# Patient Record
Sex: Female | Born: 1970 | Race: Black or African American | Hispanic: No | Marital: Single | Smoking: Never smoker
Health system: Southern US, Community
[De-identification: ages and names within clinical notes are randomized; demographics above are authoritative.]

## PROBLEM LIST (undated history)

## (undated) DIAGNOSIS — Z5189 Encounter for other specified aftercare: Secondary | ICD-10-CM

## (undated) DIAGNOSIS — D649 Anemia, unspecified: Secondary | ICD-10-CM

## (undated) DIAGNOSIS — N920 Excessive and frequent menstruation with regular cycle: Secondary | ICD-10-CM

## (undated) DIAGNOSIS — M069 Rheumatoid arthritis, unspecified: Secondary | ICD-10-CM

## (undated) DIAGNOSIS — E669 Obesity, unspecified: Secondary | ICD-10-CM

## (undated) DIAGNOSIS — K209 Esophagitis, unspecified without bleeding: Secondary | ICD-10-CM

## (undated) DIAGNOSIS — I2510 Atherosclerotic heart disease of native coronary artery without angina pectoris: Secondary | ICD-10-CM

## (undated) DIAGNOSIS — I10 Essential (primary) hypertension: Secondary | ICD-10-CM

## (undated) DIAGNOSIS — I214 Non-ST elevation (NSTEMI) myocardial infarction: Secondary | ICD-10-CM

## (undated) DIAGNOSIS — Z9289 Personal history of other medical treatment: Secondary | ICD-10-CM

## (undated) HISTORY — DX: Esophagitis, unspecified without bleeding: K20.90

## (undated) HISTORY — DX: Personal history of other medical treatment: Z92.89

## (undated) HISTORY — DX: Obesity, unspecified: E66.9

## (undated) HISTORY — PX: DILATION AND CURETTAGE OF UTERUS: SHX78

## (undated) HISTORY — DX: Encounter for other specified aftercare: Z51.89

## (undated) HISTORY — DX: Esophagitis, unspecified: K20.9

## (undated) HISTORY — DX: Excessive and frequent menstruation with regular cycle: N92.0

## (undated) HISTORY — DX: Essential (primary) hypertension: I10

## (undated) HISTORY — PX: BREAST BIOPSY: SHX20

## (undated) HISTORY — PX: OTHER SURGICAL HISTORY: SHX169

---

## 2011-04-23 ENCOUNTER — Encounter: Payer: Self-pay | Admitting: *Deleted

## 2011-04-23 ENCOUNTER — Emergency Department (HOSPITAL_COMMUNITY)
Admission: EM | Admit: 2011-04-23 | Discharge: 2011-04-23 | Disposition: A | Discharge status: Home / Self Care | Payer: Medicaid Other | Attending: Emergency Medicine | Admitting: Emergency Medicine

## 2011-04-23 DIAGNOSIS — L0231 Cutaneous abscess of buttock: Secondary | ICD-10-CM | POA: Insufficient documentation

## 2011-04-23 DIAGNOSIS — L03317 Cellulitis of buttock: Secondary | ICD-10-CM | POA: Insufficient documentation

## 2011-04-23 MED ORDER — HYDROCODONE-ACETAMINOPHEN 5-325 MG PO TABS
1.0000 | ORAL_TABLET | Freq: Four times a day (QID) | ORAL | Status: AC | PRN
Start: 2011-04-23 — End: 2011-05-03

## 2011-04-23 MED ORDER — LIDOCAINE-EPINEPHRINE 2 %-1:100000 IJ SOLN
20.0000 mL | Freq: Once | INTRAMUSCULAR | Status: AC
  Administered 2011-04-23: 20 mL
  Filled 2011-04-23: qty 1

## 2011-04-23 MED ORDER — HYDROCODONE-ACETAMINOPHEN 5-325 MG PO TABS
1.0000 | ORAL_TABLET | Freq: Once | ORAL | Status: AC
  Administered 2011-04-23: 1 via ORAL
  Filled 2011-04-23: qty 1

## 2011-04-23 MED ORDER — DOXYCYCLINE HYCLATE 100 MG PO CAPS: 100.0000 mg | ORAL_CAPSULE | Freq: Two times a day (BID) | ORAL | Status: DC

## 2011-04-23 NOTE — ED Provider Notes (Signed)
Medical screening examination/treatment/procedure(s) were performed by non-physician practitioner and as supervising physician I was immediately available for consultation/collaboration.  Chalsey Leeth K Queena Monrreal-Rasch, MD 04/23/11 2245 

## 2011-04-23 NOTE — ED Provider Notes (Signed)
History     CSN: [id number] Arrival date & time: 04/23/2011  5:48 PM   First MD Initiated Contact with Patient 04/23/11 1955     HPI Patient reports an abscess developing on the left buttock. States she's had a similar abscess 6 years ago in same area. Points to abscess located lateral to the anus. Denies any drainage. Denies fever.  Patient is a 40 y.o. female presenting with abscess. The history is provided by the patient.  Abscess  This is a new problem. The current episode started less than one week ago. The problem has been gradually worsening. The abscess is present on the left buttock. The problem is severe. The abscess is characterized by painfulness and swelling. Pertinent negatives include no anorexia, no fever, no vomiting, no rhinorrhea and no cough.    History reviewed. No pertinent past medical history.  Past Surgical History  Procedure Date  . Cesarean section     No family history on file.  History  Substance Use Topics  . Smoking status: Never Smoker   . Smokeless tobacco: Not on file  . Alcohol Use: No    OB History    Grav Para Term Preterm Abortions TAB SAB Ect Mult Living                  Review of Systems  Constitutional: Negative for fever and chills.  HENT: Negative for rhinorrhea.   Eyes: Negative for redness.  Respiratory: Negative for cough, shortness of breath and wheezing.   Cardiovascular: Negative for chest pain and palpitations.  Gastrointestinal: Negative for nausea, vomiting and anorexia.  Skin:       Abscess    Allergies  Review of patient's allergies indicates no known allergies.  Home Medications   Current Outpatient Rx  Name Route Sig Dispense Refill  . IBUPROFEN 100 MG PO TABS Oral Take 200 mg by mouth every 6 (six) hours as needed. Pain.        BP 118/83  Pulse 79  Temp(Src) 98.8 F (37.1 C) (Oral)  Resp 18  SpO2 98%  LMP 04/23/2011  Physical Exam  Vitals reviewed. Constitutional: She is oriented to person,  place, and time. Vital signs are normal. She appears well-developed and well-nourished. No distress.  HENT:  Head: Normocephalic and atraumatic.  Eyes: Pupils are equal, round, and reactive to light.  Neck: Neck supple.  Pulmonary/Chest: Effort normal.  Neurological: She is alert and oriented to person, place, and time.  Skin: Skin is warm and dry. No rash noted. No erythema. No pallor.       Large 3 cm fluctuant not erythematous abscess on left buttock lateral to the pain is at approximately the 7:00 position. Abscesses not extend rectally after completing rectal exam. Not erythematous. Not draining.  Psychiatric: She has a normal mood and affect. Her behavior is normal.    ED Course  INCISION AND DRAINAGE Performed by: Thomasene Lot Authorized by: Thomasene Lot Consent: Verbal consent obtained. Risks and benefits: risks, benefits and alternatives were discussed Consent given by: patient Patient understanding: patient states understanding of the procedure being performed Patient identity confirmed: verbally with patient Time out: Immediately prior to procedure a "time out" was called to verify the correct patient, procedure, equipment, support staff and site/side marked as required. Type: abscess Location: Left buttock. Local anesthetic: lidocaine 2% with epinephrine Anesthetic total: 7 ml Patient sedated: no Scalpel size: 11 Incision type: single straight Complexity: simple Drainage: purulent Drainage amount: copious Wound treatment: wound left  open Packing material: 1/4 in iodoform gauze Comments: Patient unable to tolerate complete incision of abscess do to an adequate anesthesia with local. Have packed wound is advised patient to be sure to take complete course of antibiotics and tube were water soaks. Advised return for reassessment in 2-3 days.    MDM         Thomasene Lot, PA 04/23/11 2222

## 2011-04-23 NOTE — ED Notes (Signed)
Pt noticed pain in L buttocks last Sunday, more severe pain beginning Friday. Hx of multiple boils. Denies drainage.

## 2011-04-27 ENCOUNTER — Emergency Department (HOSPITAL_COMMUNITY)
Admission: EM | Admit: 2011-04-27 | Discharge: 2011-04-27 | Disposition: A | Discharge status: Home / Self Care | Payer: Medicaid Other | Attending: Emergency Medicine | Admitting: Emergency Medicine

## 2011-04-27 ENCOUNTER — Encounter (HOSPITAL_COMMUNITY): Payer: Self-pay | Admitting: Adult Health

## 2011-04-27 DIAGNOSIS — L0291 Cutaneous abscess, unspecified: Secondary | ICD-10-CM

## 2011-04-27 DIAGNOSIS — L0231 Cutaneous abscess of buttock: Secondary | ICD-10-CM | POA: Insufficient documentation

## 2011-04-27 NOTE — ED Provider Notes (Signed)
History     CSN: [id number] Arrival date & time: 04/27/2011  6:36 PM   First MD Initiated Contact with Patient 04/27/11 1854      Chief Complaint  Patient presents with  . Wound Check    (Consider location/radiation/quality/duration/timing/severity/associated sxs/prior treatment) Patient is a 40 y.o. female presenting with wound check. The history is provided by the patient.  Wound Check  She was treated in the ED 10 to 14 days ago. Previous treatment in the ED includes oral antibiotics and I&D of abscess. Treatments since wound repair include antibiotic ointment use and a wound recheck. There has been no drainage from the wound. The redness has improved. The swelling has improved. The pain has improved.   Pt still has 7 days of antibiotics left. Wound I and D last Wednesday, a lot of improvement. Right inner buttock.  History reviewed. No pertinent past medical history.  Past Surgical History  Procedure Date  . Cesarean section     History reviewed. No pertinent family history.  History  Substance Use Topics  . Smoking status: Never Smoker   . Smokeless tobacco: Not on file  . Alcohol Use: No    OB History    Grav Para Term Preterm Abortions TAB SAB Ect Mult Living                  Review of Systems  All other systems reviewed and are negative.    Allergies  Review of patient's allergies indicates no known allergies.  Home Medications   Current Outpatient Rx  Name Route Sig Dispense Refill  . DOXYCYCLINE HYCLATE 100 MG PO TABS Oral Take 100 mg by mouth 2 (two) times daily. 10 day course of therapy; not completed.     Marland Kitchen HYDROCODONE-ACETAMINOPHEN 5-325 MG PO TABS Oral Take 1-2 tablets by mouth every 6 (six) hours as needed for pain. 15 tablet 0  . IBUPROFEN 100 MG PO TABS Oral Take 400 mg by mouth every 6 (six) hours as needed. For pain.      BP 125/106  Pulse 96  Temp(Src) 98.5 F (36.9 C) (Oral)  Resp 20  SpO2 100%  LMP 04/23/2011  Physical Exam    Nursing note and vitals reviewed. Constitutional: She appears well-developed and well-nourished.  HENT:  Head: Normocephalic and atraumatic.  Eyes: Conjunctivae are normal. Pupils are equal, round, and reactive to light.  Neck: Trachea normal, normal range of motion and full passive range of motion without pain. Neck supple.  Cardiovascular: Normal rate, regular rhythm and normal pulses.   Pulmonary/Chest: Effort normal and breath sounds normal. Chest wall is not dull to percussion. She exhibits no tenderness, no crepitus, no edema, no deformity and no retraction.  Abdominal: Soft. Normal appearance and bowel sounds are normal.  Musculoskeletal: Normal range of motion.  Lymphadenopathy:       Head (right side): No submental, no submandibular, no tonsillar, no preauricular, no posterior auricular and no occipital adenopathy present.       Head (left side): No submental, no submandibular, no tonsillar, no preauricular, no posterior auricular and no occipital adenopathy present.    She has no cervical adenopathy.    She has no axillary adenopathy.  Neurological: She is alert. She has normal strength.  Skin: Skin is warm, dry and intact.     Psychiatric: She has a normal mood and affect. Her speech is normal and behavior is normal. Judgment and thought content normal. Cognition and memory are normal.    ED  Course  Procedures (including critical care time)  Labs Reviewed - No data to display No results found.   No diagnosis found.    MDM          Dorthula Matas, PA 04/27/11 1920

## 2011-04-27 NOTE — ED Notes (Signed)
Here for wound check

## 2011-04-27 NOTE — ED Provider Notes (Signed)
Medical screening examination/treatment/procedure(s) were performed by non-physician practitioner and as supervising physician I was immediately available for consultation/collaboration.   Joya Gaskins, MD 04/27/11 [phone]

## 2012-06-03 HISTORY — PX: CARDIAC CATHETERIZATION: SHX172

## 2012-11-14 ENCOUNTER — Encounter (HOSPITAL_COMMUNITY): Payer: Self-pay | Admitting: Emergency Medicine

## 2012-11-14 ENCOUNTER — Inpatient Hospital Stay (HOSPITAL_COMMUNITY)
Admission: EM | Admit: 2012-11-14 | Discharge: 2012-11-15 | DRG: 281 | Discharge status: Against Medical Advice | Payer: Medicaid Other | Attending: Internal Medicine | Admitting: Internal Medicine

## 2012-11-14 ENCOUNTER — Emergency Department (HOSPITAL_COMMUNITY): Payer: Medicaid Other

## 2012-11-14 DIAGNOSIS — I519 Heart disease, unspecified: Secondary | ICD-10-CM

## 2012-11-14 DIAGNOSIS — D473 Essential (hemorrhagic) thrombocythemia: Secondary | ICD-10-CM

## 2012-11-14 DIAGNOSIS — D509 Iron deficiency anemia, unspecified: Secondary | ICD-10-CM | POA: Diagnosis present

## 2012-11-14 DIAGNOSIS — I214 Non-ST elevation (NSTEMI) myocardial infarction: Principal | ICD-10-CM | POA: Diagnosis present

## 2012-11-14 DIAGNOSIS — M0579 Rheumatoid arthritis with rheumatoid factor of multiple sites without organ or systems involvement: Secondary | ICD-10-CM | POA: Diagnosis present

## 2012-11-14 DIAGNOSIS — M069 Rheumatoid arthritis, unspecified: Secondary | ICD-10-CM

## 2012-11-14 DIAGNOSIS — Z6841 Body Mass Index (BMI) 40.0 and over, adult: Secondary | ICD-10-CM

## 2012-11-14 DIAGNOSIS — R7989 Other specified abnormal findings of blood chemistry: Secondary | ICD-10-CM

## 2012-11-14 DIAGNOSIS — D649 Anemia, unspecified: Secondary | ICD-10-CM

## 2012-11-14 DIAGNOSIS — E669 Obesity, unspecified: Secondary | ICD-10-CM | POA: Diagnosis present

## 2012-11-14 HISTORY — DX: Rheumatoid arthritis, unspecified: M06.9

## 2012-11-14 LAB — CBC WITH DIFFERENTIAL/PLATELET
Basophils Absolute: 0.1 10*3/uL (ref 0.0–0.1)
Basophils Relative: 1 % (ref 0–1)
Eosinophils Absolute: 0.2 10*3/uL (ref 0.0–0.7)
Eosinophils Relative: 2 % (ref 0–5)
HCT: 27.3 % — ABNORMAL LOW (ref 36.0–46.0)
Hemoglobin: 7.6 g/dL — ABNORMAL LOW (ref 12.0–15.0)
Lymphocytes Relative: 29 % (ref 12–46)
Lymphs Abs: 2.9 10*3/uL (ref 0.7–4.0)
MCH: 16.6 pg — ABNORMAL LOW (ref 26.0–34.0)
MCHC: 27.8 g/dL — ABNORMAL LOW (ref 30.0–36.0)
MCV: 59.5 fL — ABNORMAL LOW (ref 78.0–100.0)
Monocytes Absolute: 1.1 10*3/uL — ABNORMAL HIGH (ref 0.1–1.0)
Monocytes Relative: 11 % (ref 3–12)
Neutro Abs: 5.6 10*3/uL (ref 1.7–7.7)
Neutrophils Relative %: 57 % (ref 43–77)
Platelets: 689 10*3/uL — ABNORMAL HIGH (ref 150–400)
RBC: 4.59 MIL/uL (ref 3.87–5.11)
RDW: 21 % — ABNORMAL HIGH (ref 11.5–15.5)
WBC: 9.9 10*3/uL (ref 4.0–10.5)

## 2012-11-14 LAB — COMPREHENSIVE METABOLIC PANEL
BUN: 7 mg/dL (ref 6–23)
CO2: 25 mEq/L (ref 19–32)
Calcium: 9.2 mg/dL (ref 8.4–10.5)
Creatinine, Ser: 0.66 mg/dL (ref 0.50–1.10)
GFR calc Af Amer: >90 mL/min (ref >90)
GFR calc non Af Amer: >90 mL/min (ref >90)
Glucose, Bld: 95 mg/dL (ref 70–99)
Total Bilirubin: 0.2 mg/dL — ABNORMAL LOW (ref 0.3–1.2)

## 2012-11-14 LAB — MRSA PCR SCREENING: MRSA by PCR: NEGATIVE

## 2012-11-14 LAB — RAPID URINE DRUG SCREEN, HOSP PERFORMED
Amphetamines: NOT DETECTED
Barbiturates: NOT DETECTED
Benzodiazepines: NOT DETECTED
Cocaine: NOT DETECTED
Opiates: NOT DETECTED
Tetrahydrocannabinol: NOT DETECTED

## 2012-11-14 LAB — CBC
HCT: 32.7 % — ABNORMAL LOW (ref 36.0–46.0)
Hemoglobin: 9.2 g/dL — ABNORMAL LOW (ref 12.0–15.0)
MCHC: 28.1 g/dL — ABNORMAL LOW (ref 30.0–36.0)
MCV: 61 fL — ABNORMAL LOW (ref 78.0–100.0)
RBC: 5.36 MIL/uL — ABNORMAL HIGH (ref 3.87–5.11)
RDW: 22.8 % — ABNORMAL HIGH (ref 11.5–15.5)
WBC: 12.2 10*3/uL — ABNORMAL HIGH (ref 4.0–10.5)

## 2012-11-14 LAB — TROPONIN I
Troponin I: 1.65 ng/mL (ref <0.30)
Troponin I: 2.33 ng/mL (ref <0.30)

## 2012-11-14 LAB — FERRITIN: Ferritin: 1 ng/mL — ABNORMAL LOW (ref 10–291)

## 2012-11-14 LAB — PROTIME-INR
INR: 1.14 (ref 0.00–1.49)
Prothrombin Time: 14.4 seconds (ref 11.6–15.2)

## 2012-11-14 LAB — RETICULOCYTES
RBC.: 4.87 MIL/uL (ref 3.87–5.11)
Retic Count, Absolute: 92.5 10*3/uL (ref 19.0–186.0)
Retic Ct Pct: 1.9 % (ref 0.4–3.1)

## 2012-11-14 LAB — VITAMIN B12: Vitamin B-12: 599 pg/mL (ref 211–911)

## 2012-11-14 LAB — FOLATE: Folate: 19 ng/mL

## 2012-11-14 MED ORDER — SODIUM CHLORIDE 0.9 % IV SOLN: 250.0000 mL | INTRAVENOUS | Status: DC | PRN

## 2012-11-14 MED ORDER — ONDANSETRON HCL 4 MG/2ML IJ SOLN: 4.0000 mg | Freq: Four times a day (QID) | INTRAMUSCULAR | Status: DC | PRN

## 2012-11-14 MED ORDER — ASPIRIN EC 81 MG PO TBEC
81.0000 mg | DELAYED_RELEASE_TABLET | Freq: Every day | ORAL | Status: DC
  Administered 2012-11-15: 81 mg via ORAL
  Filled 2012-11-14: qty 1

## 2012-11-14 MED ORDER — ACETAMINOPHEN 325 MG PO TABS: 650.0000 mg | ORAL_TABLET | ORAL | Status: DC | PRN

## 2012-11-14 MED ORDER — ASPIRIN 81 MG PO CHEW
324.0000 mg | CHEWABLE_TABLET | Freq: Once | ORAL | Status: AC
  Administered 2012-11-14: 324 mg via ORAL
  Filled 2012-11-14: qty 4

## 2012-11-14 MED ORDER — NITROGLYCERIN 0.4 MG SL SUBL
0.4000 mg | SUBLINGUAL_TABLET | SUBLINGUAL | Status: DC | PRN
  Administered 2012-11-14: 0.4 mg via SUBLINGUAL
  Filled 2012-11-14: qty 25

## 2012-11-14 MED ORDER — HEPARIN (PORCINE) IN NACL 100-0.45 UNIT/ML-% IJ SOLN
1500.0000 [IU]/h | INTRAMUSCULAR | Status: DC
  Administered 2012-11-14: 1250 [IU]/h via INTRAVENOUS
  Administered 2012-11-15: 1500 [IU]/h via INTRAVENOUS
  Filled 2012-11-14 (×4): qty 250

## 2012-11-14 MED ORDER — MORPHINE SULFATE 2 MG/ML IJ SOLN: 2.0000 mg | INTRAMUSCULAR | Status: DC | PRN

## 2012-11-14 MED ORDER — GI COCKTAIL ~~LOC~~
30.0000 mL | Freq: Once | ORAL | Status: AC
  Administered 2012-11-14: 30 mL via ORAL
  Filled 2012-11-14: qty 30

## 2012-11-14 MED ORDER — SODIUM CHLORIDE 0.9 % IJ SOLN: 3.0000 mL | INTRAMUSCULAR | Status: DC | PRN

## 2012-11-14 MED ORDER — SIMVASTATIN 20 MG PO TABS
20.0000 mg | ORAL_TABLET | Freq: Every day | ORAL | Status: DC
  Filled 2012-11-14: qty 1

## 2012-11-14 MED ORDER — ATORVASTATIN CALCIUM 80 MG PO TABS
80.0000 mg | ORAL_TABLET | Freq: Every day | ORAL | Status: DC
  Administered 2012-11-14: 80 mg via ORAL
  Filled 2012-11-14 (×2): qty 1

## 2012-11-14 MED ORDER — SODIUM CHLORIDE 0.9 % IJ SOLN: 3.0000 mL | Freq: Two times a day (BID) | INTRAMUSCULAR | Status: DC

## 2012-11-14 MED ORDER — HEPARIN BOLUS VIA INFUSION
4000.0000 [IU] | Freq: Once | INTRAVENOUS | Status: AC
  Administered 2012-11-14: 4000 [IU] via INTRAVENOUS
  Filled 2012-11-14: qty 4000

## 2012-11-14 MED ORDER — HEPARIN BOLUS VIA INFUSION
2000.0000 [IU] | Freq: Once | INTRAVENOUS | Status: AC
  Administered 2012-11-14: 2000 [IU] via INTRAVENOUS
  Filled 2012-11-14: qty 2000

## 2012-11-14 MED ORDER — SODIUM CHLORIDE 0.9 % IV SOLN: INTRAVENOUS | Status: DC

## 2012-11-14 MED ORDER — HEPARIN (PORCINE) IN NACL 100-0.45 UNIT/ML-% IJ SOLN
1000.0000 [IU]/h | INTRAMUSCULAR | Status: DC
  Administered 2012-11-14: 1000 [IU]/h via INTRAVENOUS
  Filled 2012-11-14: qty 250

## 2012-11-14 NOTE — ED Notes (Signed)
Report called to tim.

## 2012-11-14 NOTE — ED Provider Notes (Signed)
  Medical screening examination/treatment/procedure(s) were performed by non-physician practitioner and as supervising physician I was immediately available for consultation/collaboration.  On my exam the patient was in no distress.  However, given her description of new chest pain, her elevated troponin, her history of rheumatologic disease, her ECG with low voltage, and her anemia, there is clearly significant processes at work. I performed a bedside ultrasound of her heart to evaluate for pericardial effusion. And this was negative. Given the patient's elevated troponin, her description of new CP, there is some suspicion of unstable angina, and with this concern, heparin was started after consult with cardiology.  With the remaining concern for pericarditis vs. Myocarditis, the patient was admitted and had expeditious echo ordered, as well as transfusion (concern for anemia in the setting of increased myocardial demand).  CRITICAL CARE Performed by: Gerhard Munch Total critical care time: 35 Critical care time was exclusive of separately billable procedures and treating other patients. Critical care was necessary to treat or prevent imminent or life-threatening deterioration. Critical care was time spent personally by me on the following activities: development of treatment plan with patient and/or surrogate as well as nursing, discussions with consultants, evaluation of patient's response to treatment, examination of patient, obtaining history from patient or surrogate, ordering and performing treatments and interventions, ordering and review of laboratory studies, ordering and review of radiographic studies, pulse oximetry and re-evaluation of patient's condition.  ECG most notable for low voltage, w SR 94 CXR essentially unremarkable.  Gerhard Munch, MD 11/14/12 [phone]

## 2012-11-14 NOTE — Progress Notes (Signed)
ANTICOAGULATION CONSULT NOTE - Initial Consult  Pharmacy Consult for heparin Indication: NSTEMI  No Known Allergies  Patient Measurements: Height: 5\' 3"  (160 cm) Weight: 250 lb 14.1 oz (113.8 kg) IBW/kg (Calculated) : 52.4 Heparin Dosing Weight: 79.5 kg  Vital Signs: Temp: 97.9 F (36.6 C) (06/14 1240) Temp src: Oral (06/14 1240) BP: 100/65 mmHg (06/14 1200) Pulse Rate: 82 (06/14 1240)  Labs:  Recent Labs  11/14/12 0806 11/14/12 0810  HGB 7.6*  --   HCT 27.3*  --   PLT 689*  --   CREATININE 0.66  --   TROPONINI  --  1.65*    Estimated Creatinine Clearance: 112.5 ml/min (by C-G formula based on Cr of 0.66).   Medical History: Past Medical History  Diagnosis Date  . RA (rheumatoid arthritis)     Medications:  No prescriptions prior to admission    Assessment: 75 y/oF admitted with chest pain and elevated troponins, starting heparin per pharmacy. On admit, hgb 7.6 and platelets 689 with planned transfusion PRBCs. Planning cath for Monday.   Goal of Therapy:  Heparin level 0.3-0.7 units/ml Monitor platelets by anticoagulation protocol: Yes   Plan:  Bolus heparin 4000 units Start heparin drip at 1000 units/hr 6 hr heparin level Daily heparin level, CBC   Doris Cheadle, PharmD Clinical Pharmacist Pager: [phone] Phone: [phone] 11/14/2012 12:56 PM

## 2012-11-14 NOTE — ED Notes (Addendum)
Pt c/o intermittent center cp that radiates to back and down left arm onset Thursday. Pt reports felt fine Friday and was awaken today with CP. Pt reports doing exercises on Wednesday

## 2012-11-14 NOTE — Progress Notes (Signed)
ANTICOAGULATION CONSULT NOTE - Follow Up Consult  Pharmacy Consult for heparin Indication: NSTEMI  No Known Allergies  Patient Measurements: Height: 5\' 3"  (160 cm) Weight: 250 lb 14.1 oz (113.8 kg) IBW/kg (Calculated) : 52.4 Heparin Dosing Weight: 79.5kg  Vital Signs: Temp: 98 F (36.7 C) (06/14 2000) Temp src: Oral (06/14 2000) BP: 153/94 mmHg (06/14 2000) Pulse Rate: 82 (06/14 1240)  Labs:  Recent Labs  11/14/12 0806  11/14/12 1301 11/14/12 1302 11/14/12 1430 11/14/12 2000 11/14/12 2017  HGB 7.6*  --   --   --   --  9.2*  --   HCT 27.3*  --   --   --   --  32.7*  --   PLT 689*  --   --   --   --  673*  --   APTT  --   --   --   --  84*  --   --   LABPROT  --   --   --   --  14.4  --   --   INR  --   --   --   --  1.14  --   --   HEPARINUNFRC  --   --   --   --   --   --  0.15*  CREATININE 0.66  --   --   --   --   --   --   TROPONINI  --   < > 2.34* 2.25*  --   --  2.33*  < > = values in this interval not displayed.  Estimated Creatinine Clearance: 112.5 ml/min (by C-G formula based on Cr of 0.66).   Medications:  Scheduled:  . [START ON 11/15/2012] aspirin EC  81 mg Oral Daily  . atorvastatin  80 mg Oral q1800  . heparin  2,000 Units Intravenous Once  . sodium chloride  3 mL Intravenous Q12H    Assessment: 42 yo female with NSTEMI on heparin. The initial heparin level is 0.15 and below goal.  Goal of Therapy:  Heparin level 0.3-0.7 units/ml Monitor platelets by anticoagulation protocol: Yes   Plan:  -heparin 2000 unit bolus then increase infusion to 1250 units/hr -Heparin level in 6hrs  Harland German, Pharm D 11/14/2012 9:04 PM

## 2012-11-14 NOTE — Consult Note (Signed)
CARDIOLOGY CONSULT NOTE  Patient ID: Abigail Garcia MRN: [id number] DOB/AGE: 42-May-1972 42 y.o.  Admit date: 11/14/2012  Reason for Consultation: Elevated troponin/NSTEMI  HPI:  42 yo with history of rheumatoid arthritis presented to the ER today for evaluation of chest pain.  Patient has no history of cardiac disease.  On Thursday, she noted soreness in her chest substernally.  There was not trigger.  She thought she had strained a muscle.  The pain waxed and waned Thursday and Friday, sometimes resolving completely.   Nothing made it better or worse.  Early this morning, the pain woke her from sleep.  This concerned her so she came to the ER.  She is now pain-free again.  The pain has not been pleuritic.  She never had it prior to Thursday.  No history of PE/DVT and no long car/plane rides, surgery, or periods of immobility.  CXR was unremarkable.  ECG was nonspecific.  Troponin was elevated at 1.65.   At baseline, she denies exertional dyspnea or chest pain.  No episodes of palpitations or syncope.  Her RA has been quiescent for the last year and she is not on medications at this time.  No significant family history of cardiac disease.  She is anemic with no overt bleeding (no BRBPR or melena).   Review of systems complete and found to be negative unless listed above in HPI  Past Medical History: 1. Rheumatoid arthritis: No meds currently.  Has taken prednisone in the past.  2. Anemia  No outpatient meds  Family History: No cardiac disease that she can think of.  History   Social History  . Marital Status: Single    Spouse Name: N/A    Number of Children: 1 son  . Years of Education: N/A   Occupational History  . [employer] information systems officer [employer]   Social History Main Topics  . Smoking status: Never Smoker   . Smokeless tobacco: Not on file  . Alcohol Use: No  . Drug Use: No  . Sexually Active: Not on file   Other Topics Concern  . Not on file   Social History Narrative    . No narrative on file      (Not in a hospital admission)  Physical exam Blood pressure 100/65, pulse 71, temperature 98.6 F (37 C), temperature source Oral, resp. rate 14, height 5\' 3"  (1.6 m), weight 240 lb (108.863 kg), last menstrual period 11/03/2012, SpO2 100.00%. General: NAD Neck: No JVD, no thyromegaly or thyroid nodule.  Lungs: Clear to auscultation bilaterally with normal respiratory effort. CV: Nondisplaced PMI.  Heart regular S1/S2, no S3/S4, no murmur.  No peripheral edema.  No carotid bruit.  Normal pedal pulses.  Abdomen: Soft, nontender, no hepatosplenomegaly, no distention.  Skin: Intact without lesions or rashes.  Neurologic: Alert and oriented x 3.  Psych: Normal affect. Extremities: No clubbing or cyanosis.  HEENT: Normal.   Labs:   Lab Results  Component Value Date   WBC 9.9 11/14/2012   HGB 7.6* 11/14/2012   HCT 27.3* 11/14/2012   MCV 59.5* 11/14/2012   PLT 689* 11/14/2012    Recent Labs Lab 11/14/12 0806  NA 137  K 3.7  CL 103  CO2 25  BUN 7  CREATININE 0.66  CALCIUM 9.2  PROT 7.5  BILITOT 0.2*  ALKPHOS 66  ALT 9  AST 16  GLUCOSE 95   Lab Results  Component Value Date   TROPONINI 1.65* 11/14/2012     Radiology:  -  CXR: No acute findings  EKG:NSR, T wave flattening in III and AVF  ASSESSMENT AND PLAN:  42 yo with history of RA and anemia presented with substernal chest soreness and elevated troponin. 1. NSTEMI: Elevated TnI to 1.65.  She has had chest soreness on and off since Thursday.  Currently pain-free.  Pain is not pleuritic, no factors pointing towards PE.  She does have rheumatoid arthritis.  This increases her risk for CAD and also can be associated with myocarditis or pericarditis.  She does not have a family history of premature CAD or any risk factors for CAD other than RA.   - Echo today for evidence of myocarditis/pericarditis or evidence of regional wall motion abnormality suggesting CAD.  - Cycle cardiac enzymes to peak.   - Agree with ASA, heparin gtt, atorvastatin 80.  - I think that she will need LHC to rule out significant CAD.  Will arrange for Monday if she remains stable.  2. RA: No joint pains.  Would be unusual to develop myopericarditis in the absence of active joint symptoms.  Will check ESR. 3. Anemia: Agree with transfusion for hgb< 8 in setting of ACS.  Workup per primary team.   Signed: [name] 11/14/2012 12:48 PM

## 2012-11-14 NOTE — ED Notes (Signed)
MD aware of elevated troponin

## 2012-11-14 NOTE — Progress Notes (Signed)
  Echocardiogram 2D Echocardiogram has been performed.  Hansen Carino FRANCES 11/14/2012, 4:51 PM

## 2012-11-14 NOTE — H&P (Signed)
Date: 11/14/2012               Patient Name:  Abigail Garcia MRN: [id number]  DOB: 07-11-1970 Age / Sex: 42 y.o., female   PCP: No Pcp Per Patient         Medical Service: Internal Medicine Teaching Service         Attending Physician: Dr. Rogelia Boga    First Contact: Dr. Elenor Legato Pager: [phone]  Second Contact: Dr. Suszanne Conners Pager: [phone]       After Hours (After 5p/  First Contact Pager: [phone]  weekends / holidays): Second Contact Pager: [phone]   Chief Complaint: chest pain  History of Present Illness: patient is a 42 year old woman with PMH significant for rheumatoid arthritis not currently on any medications who presents to the ED with complaints of chest pain. The patient states that she had an episode of midsternal chest pain 2 days prior to admission which she describes as feeling "sore" that was moderate/severe on onset, however resolved slowly over 48 hours. She states she had a subsequent episode of similar chest pain on the morning of admission that was slightly more severe than her previous episode and did not resolve, which prompted her to call EMS. She states the chest pain radiated through her chest to her back and also down her left arm. She states the pain is somewhat improved after receiving nitroglycerin in the ED. She denies any shortness of breath, diaphoresis, nausea, or lightheadedness with either episode of chest pain. She denies leg swelling. She denies any recent change in her activity level or recent immobilization. Denies any recent travel.   Meds: Current Facility-Administered Medications  Medication Dose Route Frequency Provider Last Rate Last Dose  . nitroGLYCERIN (NITROSTAT) SL tablet 0.4 mg  0.4 mg Sublingual Q5 min PRN Trixie Dredge, PA-C   0.4 mg at 11/14/12 1035   No current outpatient prescriptions on file.    Allergies: Allergies as of 11/14/2012  . (No Known Allergies)   Past Medical History  Diagnosis Date  . RA (rheumatoid arthritis)     Past Surgical History  Procedure Laterality Date  . Cesarean section     No family history on file. History   Social History  . Marital Status: Single    Spouse Name: N/A    Number of Children: N/A  . Years of Education: N/A   Occupational History  . Not on file.   Social History Main Topics  . Smoking status: Never Smoker   . Smokeless tobacco: Not on file  . Alcohol Use: No  . Drug Use: No  . Sexually Active: Not on file   Other Topics Concern  . Not on file   Social History Narrative  . No narrative on file    Review of Systems: Review of Systems  Constitutional: Negative for fever, chills and malaise/fatigue.  HENT: Negative.   Eyes: Negative.   Respiratory: Negative for cough and hemoptysis.   Cardiovascular: Positive for chest pain. Negative for palpitations, orthopnea and leg swelling.  Gastrointestinal: Negative for nausea, vomiting, abdominal pain, blood in stool and melena.  Genitourinary: Negative for dysuria and hematuria.  Musculoskeletal: Negative for myalgias and joint pain.  Skin: Negative.   Neurological: Positive for tingling (L hand). Negative for dizziness.  Endo/Heme/Allergies: Negative.   Psychiatric/Behavioral: Negative.      Physical Exam: Blood pressure 114/78, pulse 84, temperature 98.6 F (37 C), temperature source Oral, resp. rate 17, height 5\' 3"  (1.6 m), weight 240  lb (108.863 kg), last menstrual period 11/03/2012, SpO2 100.00%.  Physical Exam  Constitutional: She is oriented to person, place, and time and well-developed, well-nourished, and in no distress. No distress.  HENT:  Head: Normocephalic and atraumatic.  Eyes: Conjunctivae are normal. Pupils are equal, round, and reactive to light. No scleral icterus.  Neck: Normal range of motion. Neck supple. No tracheal deviation present.  Cardiovascular: Normal rate and regular rhythm.   No murmur heard. Pulmonary/Chest: Effort normal. She has no wheezes. She has no rales.   Abdominal: Soft. Bowel sounds are normal. She exhibits no distension. There is no tenderness.  Musculoskeletal: Normal range of motion. She exhibits no edema and no tenderness.  Neurological: She is alert and oriented to person, place, and time. No cranial nerve deficit.  Skin: Skin is warm and dry. She is not diaphoretic. No erythema.  Psychiatric: Affect and judgment normal.     Lab results: Basic Metabolic Panel:  Recent Labs  78/29/56 0806  NA 137  K 3.7  CL 103  CO2 25  GLUCOSE 95  BUN 7  CREATININE 0.66  CALCIUM 9.2   Liver Function Tests:  Recent Labs  11/14/12 0806  AST 16  ALT 9  ALKPHOS 66  BILITOT 0.2*  PROT 7.5  ALBUMIN 3.0*   CBC:  Recent Labs  11/14/12 0806  WBC 9.9  NEUTROABS 5.6  HGB 7.6*  HCT 27.3*  MCV 59.5*  PLT 689*   Cardiac Enzymes:  Recent Labs  11/14/12 0810  TROPONINI 1.65*    Imaging results:  Dg Chest 2 View  11/14/2012   *RADIOLOGY REPORT*  Clinical Data: Chest pain  CHEST - 2 VIEW  Comparison: None.  Findings: The lungs are well-aerated and free from pulmonary edema, focal airspace consolidation or pulmonary nodule.  Cardiac and mediastinal contours are within normal limits.  No pneumothorax, or pleural effusion. No acute osseous findings.  IMPRESSION:  No acute cardiopulmonary disease.   Original Report Authenticated By: Malachy Moan, M.D.    Other results: EKG: NSR with HR ~90. No ST elevation. Q-wave and TWI in lead III. Poor R-wave progression.   Assessment & Plan by Problem:  NSTEMI - troponin = 1.65 on admission. EKG unrevealing of ST elevation however T wave inversion and Q waves are present in lead III. Pt hemodynamically stable. Cardiology has been consulted by the ED for management of this issue and they have requested internal medicine to admit given the patient's comorbid anemia associated with her rheumatoid arthritis.  - admit to step down on telemetry - Monitor vitals - start heparin drip - ASA 81mg   daily (pt received 324mg  in ED, will defer to cardiology regarding choice of second antiplatelet agent) - start b-blocker - simvastatin 20mg  QD - NTG PRN  - morphine PRN - repeat EKG in AM  - cardiology consulted   Anemia - hemoglobin = 7.6 on admission. MCV ~ 60. Pt most likely has AOCD secondary to rheumatoid arthritis, however given the degree of anemia it would seem she likely has iron deficiency as well. Denies any melena or bloody BMs. Denies any history of sickle cell disease/trait or thalassemia. She states she's had one blood transfusion in the past for this issue when she was pregnant approximately 9 years ago. She has not been on any recent iron supplementation. - transfuse 1U PRBCs given Hb < 8 in setting of NSTEMI/ACS - Check anemia panel  - post transfusion CBC  Rheumatoid arthritis - patient reports a history of rheumatoid  arthritis. She states she was last seen by a rheumatologist approximately one year ago, at which time she was prescribed prednisone however she states she has not taken this medication or any other DMARD's for approximately one year. She denies any recent symptoms/flares associated with her RA.  - will need outpatient f/u with a rheumatologist  Dispo: Disposition is deferred at this time, awaiting improvement of current medical problems. Anticipated discharge in approximately 2-3 day(s).   The patient does not have a current PCP (No Pcp Per Patient) and does need an [facility] hospital follow-up appointment after discharge.  The patient does not have transportation limitations that hinder transportation to clinic appointments.  Signed: Elfredia Nevins, MD 11/14/2012, 11:15 AM

## 2012-11-14 NOTE — ED Notes (Signed)
Patient transported to X-ray 

## 2012-11-14 NOTE — ED Provider Notes (Signed)
History     CSN: [id number]  Arrival date & time 11/14/12  4098   First MD Initiated Contact with Patient 11/14/12 0732      Chief Complaint  Patient presents with  . Chest Pain    (Consider location/radiation/quality/duration/timing/severity/associated sxs/prior treatment) HPI Comments: Patient presents with chest pain. The pain began as nonradiating chest pain two days ago while she was lying in bed, was relieved after eating.  Has associated tenderness over chest at the time that reproduced the pain, so she thought she might have pulled a muscle.  Pain was present but very mild yesterday. Today the pain woke her from sleep.  Pain is described as soreness.  Indicates central chest pain with radiation into back and left arm.  Pain is rated as moderate.  Associated occasional palpitations (described as heart racing).  Pain is intermittent, lasted 10 minutes at a time.  Is worse with lying flat.  Not worse with deep inspiration or exertion.  Denies SOB, N/V, leg swelling, lightheaded/dizziness, cough, recent illness. Denies recent travel, denies drug use.  No family hx early cardiac disease.  PMH only significant for RA.   Patient is a 42 y.o. female presenting with chest pain. The history is provided by the patient.  Chest Pain Associated symptoms: palpitations   Associated symptoms: no abdominal pain, no cough, no fatigue, no fever, no nausea, no shortness of breath and not vomiting     Past Medical History  Diagnosis Date  . RA (rheumatoid arthritis)     Past Surgical History  Procedure Laterality Date  . Cesarean section      No family history on file.  History  Substance Use Topics  . Smoking status: Never Smoker   . Smokeless tobacco: Not on file  . Alcohol Use: No    OB History   Grav Para Term Preterm Abortions TAB SAB Ect Mult Living                  Review of Systems  Constitutional: Negative for fever, activity change, appetite change and fatigue.   Respiratory: Negative for cough and shortness of breath.   Cardiovascular: Positive for chest pain and palpitations. Negative for leg swelling.  Gastrointestinal: Negative for nausea, vomiting, abdominal pain, diarrhea and constipation.  Genitourinary: Negative for dysuria and frequency.  All other systems reviewed and are negative.    Allergies  Review of patient's allergies indicates no known allergies.  Home Medications   Current Outpatient Rx  Name  Route  Sig  Dispense  Refill  . ibuprofen (ADVIL,MOTRIN) 100 MG tablet   Oral   Take 400 mg by mouth every 6 (six) hours as needed. For pain.           BP 124/82  Pulse 85  Temp(Src) 98.6 F (37 C) (Oral)  Resp 18  Ht 5\' 3"  (1.6 m)  Wt 240 lb (108.863 kg)  BMI 42.52 kg/m2  SpO2 100%  LMP 11/03/2012  Physical Exam  Nursing note and vitals reviewed. Constitutional: She appears well-developed and well-nourished. No distress.  HENT:  Head: Normocephalic and atraumatic.  Eyes: Conjunctivae are normal.  Neck: Neck supple.  Cardiovascular: Normal rate, regular rhythm and intact distal pulses.   Pulmonary/Chest: Effort normal and breath sounds normal. No respiratory distress. She has no wheezes. She has no rales. She exhibits no tenderness.  Abdominal: Soft. Bowel sounds are normal. She exhibits no distension. There is no tenderness. There is no rebound and no guarding.  Musculoskeletal: She  exhibits no edema.  Neurological: She is alert.  Skin: She is not diaphoretic.    ED Course  Procedures (including critical care time)  Labs Reviewed  CBC WITH DIFFERENTIAL - Abnormal; Notable for the following:    Hemoglobin 7.6 (*)    HCT 27.3 (*)    MCV 59.5 (*)    MCH 16.6 (*)    MCHC 27.8 (*)    RDW 21.0 (*)    Platelets 689 (*)    Monocytes Absolute 1.1 (*)    All other components within normal limits  COMPREHENSIVE METABOLIC PANEL - Abnormal; Notable for the following:    Albumin 3.0 (*)    Total Bilirubin 0.2 (*)     All other components within normal limits  TROPONIN I - Abnormal; Notable for the following:    Troponin I 1.65 (*)    All other components within normal limits   Dg Chest 2 View  11/14/2012   *RADIOLOGY REPORT*  Clinical Data: Chest pain  CHEST - 2 VIEW  Comparison: None.  Findings: The lungs are well-aerated and free from pulmonary edema, focal airspace consolidation or pulmonary nodule.  Cardiac and mediastinal contours are within normal limits.  No pneumothorax, or pleural effusion. No acute osseous findings.  IMPRESSION:  No acute cardiopulmonary disease.   Original Report Authenticated By: Malachy Moan, M.D.      Date: 11/14/2012  Rate: 94  Rhythm: normal sinus rhythm  QRS Axis: normal  Intervals: normal  ST/T Wave abnormalities: nonspecific ST changes  Conduction Disutrbances:none  Narrative Interpretation: low voltage  Old EKG Reviewed: none available   Pt states she has known PMH of anemia, has had transfusions in the past.  Denies any current bleeding.  Denies SOB, lightheadedness with walking.    10:18 AM Discussed pt with on call cardiology who will consult.  Requests medicine admit given significant anemia.   10:26 AM Admitted to Internal medicine, teaching service.   Pt discussed with Dr Jeraldine Loots.   1. NSTEMI (non-ST elevated myocardial infarction)   2. Anemia   3. Elevated platelet count     MDM  Pt with hx RA and anemia p/w chest pain.  Only significant risk factor for CAD is obesity.  Pt with normal EKG with exception of low voltage and elevated troponin.  Also found to have significant anemia, though pt is asymptomatic.  No PCP, admitted to Internal medicine teaching service.  [place] cardiology to consult.  Given patient's autoimmune disease and lack of risk factors for CAD, will defer further treatment to cardiology.            Trixie Dredge, PA-C 11/14/12 1131

## 2012-11-15 ENCOUNTER — Encounter (HOSPITAL_COMMUNITY): Payer: Self-pay | Admitting: *Deleted

## 2012-11-15 ENCOUNTER — Inpatient Hospital Stay (HOSPITAL_COMMUNITY)
Admission: EM | Admit: 2012-11-15 | Discharge: 2012-11-20 | DRG: 251 | Disposition: A | Discharge status: Home / Self Care | Payer: Medicaid Other | Attending: Internal Medicine | Admitting: Internal Medicine

## 2012-11-15 DIAGNOSIS — Z955 Presence of coronary angioplasty implant and graft: Secondary | ICD-10-CM

## 2012-11-15 DIAGNOSIS — M069 Rheumatoid arthritis, unspecified: Secondary | ICD-10-CM | POA: Diagnosis present

## 2012-11-15 DIAGNOSIS — D509 Iron deficiency anemia, unspecified: Secondary | ICD-10-CM | POA: Diagnosis not present

## 2012-11-15 DIAGNOSIS — M0579 Rheumatoid arthritis with rheumatoid factor of multiple sites without organ or systems involvement: Secondary | ICD-10-CM | POA: Diagnosis present

## 2012-11-15 DIAGNOSIS — Z7902 Long term (current) use of antithrombotics/antiplatelets: Secondary | ICD-10-CM

## 2012-11-15 DIAGNOSIS — I214 Non-ST elevation (NSTEMI) myocardial infarction: Principal | ICD-10-CM | POA: Diagnosis present

## 2012-11-15 DIAGNOSIS — Z7982 Long term (current) use of aspirin: Secondary | ICD-10-CM

## 2012-11-15 DIAGNOSIS — D649 Anemia, unspecified: Secondary | ICD-10-CM

## 2012-11-15 LAB — TYPE AND SCREEN
Antibody Screen: NEGATIVE
Unit division: 0

## 2012-11-15 LAB — BASIC METABOLIC PANEL
CO2: 24 mEq/L (ref 19–32)
Calcium: 9.4 mg/dL (ref 8.4–10.5)
Chloride: 100 mEq/L (ref 96–112)
Glucose, Bld: 105 mg/dL — ABNORMAL HIGH (ref 70–99)
Sodium: 134 mEq/L — ABNORMAL LOW (ref 135–145)

## 2012-11-15 LAB — LIPID PANEL
Cholesterol: 211 mg/dL — ABNORMAL HIGH (ref 0–200)
LDL Cholesterol: 143 mg/dL — ABNORMAL HIGH (ref 0–99)
Total CHOL/HDL Ratio: 4.8 RATIO
VLDL: 24 mg/dL (ref 0–40)

## 2012-11-15 LAB — CBC
Hemoglobin: 9 g/dL — ABNORMAL LOW (ref 12.0–15.0)
MCH: 17.8 pg — ABNORMAL LOW (ref 26.0–34.0)
RBC: 5.06 MIL/uL (ref 3.87–5.11)
WBC: 11.8 10*3/uL — ABNORMAL HIGH (ref 4.0–10.5)

## 2012-11-15 LAB — TROPONIN I
Troponin I: 1.58 ng/mL (ref <0.30)
Troponin I: 2.84 ng/mL (ref <0.30)

## 2012-11-15 LAB — HEPARIN LEVEL (UNFRACTIONATED): Heparin Unfractionated: 0.4 IU/mL (ref 0.30–0.70)

## 2012-11-15 MED ORDER — ENOXAPARIN SODIUM 120 MG/0.8ML ~~LOC~~ SOLN
1.0000 mg/kg | Freq: Once | SUBCUTANEOUS | Status: AC
  Administered 2012-11-15: 115 mg via SUBCUTANEOUS
  Filled 2012-11-15: qty 0.8

## 2012-11-15 MED ORDER — HEPARIN BOLUS VIA INFUSION
2000.0000 [IU] | Freq: Once | INTRAVENOUS | Status: AC
  Administered 2012-11-15: 2000 [IU] via INTRAVENOUS
  Filled 2012-11-15: qty 2000

## 2012-11-15 NOTE — Discharge Summary (Signed)
Name: Abigail Garcia MRN: [id number] DOB: [birth year] 42 y.o. PCP: No Pcp Per Abigail Garcia  Date of Admission: 11/14/2012  7:29 AM Date of Discharge: 11/15/2012 Attending Physician: Burns Spain, MD  ** Abigail Garcia Left AMA **  Discharge Diagnosis: NSTEMI Anemia Thrombocytosis Rheumatoid arthritis  Discharge Medications:   Medication List     As of 11/15/2012 10:10 AM    Notice      You have not been prescribed any medications.         Disposition and follow-up:   Abigail Garcia left AMA. She should be readmitted for further treatment if she returns to the emergency room within the acute/sub-acute phase of her illness--she states she plans to return the same evening.   Discharge Instructions: Abigail Garcia left AMA but was counseled to avoid any strenuous activity.  Consultations: Cardiology  Procedures Performed:  Dg Chest 2 View  11/14/2012   *RADIOLOGY REPORT*  Clinical Data: Chest pain  CHEST - 2 VIEW  Comparison: None.  Findings: The lungs are well-aerated and free from pulmonary edema, focal airspace consolidation or pulmonary nodule.  Cardiac and mediastinal contours are within normal limits.  No pneumothorax, or pleural effusion. No acute osseous findings.  IMPRESSION:  No acute cardiopulmonary disease.   Original Report Authenticated By: Malachy Moan, M.D.   Admission HPI: Abigail Garcia is a 42 year old woman with PMH significant for rheumatoid arthritis not currently on any medications who presents to the ED with complaints of chest pain. The Abigail Garcia states that she had an episode of midsternal chest pain 2 days prior to admission which she describes as feeling "sore" that was moderate/severe on onset, however resolved slowly over 48 hours. She states she had a subsequent episode of similar chest pain on the morning of admission that was slightly more severe than her previous episode and did not resolve, which prompted her to call EMS. She states the chest pain radiated through her chest  to her back and also down her left arm. She states the pain is somewhat improved after receiving nitroglycerin in the ED. She denies any shortness of breath, diaphoresis, nausea, or lightheadedness with either episode of chest pain. She denies leg swelling. She denies any recent change in her activity level or recent immobilization. Denies any recent travel.    Hospital Course by problem list: NSTEMI - pt presented with mid-sternal chest pain and troponin elevation (1.65). She was given aspirin and started on IV heparin. Pain improved after admission. Troponin trended upwards to a peak of 2.84 within 24 hours, and subsequently began to trend downwards. Cardiology was consulted and plans were made for cardiac cath on 6/16, however the Abigail Garcia left AMA on 6/15 in order to attend her son's high school graduation. Prior to leaving AMA, the case was discussed with Dr. Shirlee Latch who recommended a lovenox injection prior to discharge and to resume IV heparin if the Abigail Garcia returns in the acute/sub-acute setting, as she states her plan is to return for further care the same evening. If she returns as planned, she should be readmitted from the ED and her previous care should resume (unless her condition has changed).   Anemia - Hb = 7.6 on admission. Pt was given 1U PRBCs given setting of ACS, and Hb subsequently improved to 9.0. Ferritin = 1, consistent with iron deficiency anemia. She will benefit from initiating oral iron therapy going forward, however she left AMA prior to the issue being addressed.   Thrombocytosis - platelets = 689 on admission. Likely reactive, however this  issue will need further evaluation if her platelets remain elevated at repeat measurement after her acute issues have resolved and she has been compliant with iron therapy for 1-2 months.   Rheumatoid arthritis - no evidence of flare or active disease. Not on DMARDs. Will need outpatient f/u on this issue.   Discharge Vitals:   BP 117/75   Pulse 82  Temp(Src) 98.3 F (36.8 C) (Oral)  Resp 16  Ht 5\' 3"  (1.6 m)  Wt 250 lb 14.1 oz (113.8 kg)  BMI 44.45 kg/m2  SpO2 99%  LMP 11/03/2012  Discharge Labs:  Results for orders placed during the hospital encounter of 11/14/12 (from the past 24 hour(s))  FERRITIN     Status: Abnormal   Collection Time    11/14/12 11:13 AM      Result Value Range   Ferritin 1 (*) 10 - 291 ng/mL  FOLATE     Status: None   Collection Time    11/14/12 11:13 AM      Result Value Range   Folate 19.0    IRON AND TIBC     Status: Abnormal   Collection Time    11/14/12 11:13 AM      Result Value Range   Iron <10 (*) 42 - 135 ug/dL   TIBC Not calculated due to Iron <10.  250 - 470 ug/dL   Saturation Ratios Not calculated due to Iron <10.  20 - 55 %   UIBC 452 (*) 125 - 400 ug/dL  RETICULOCYTES     Status: None   Collection Time    11/14/12 11:13 AM      Result Value Range   Retic Ct Pct 1.9  0.4 - 3.1 %   RBC. 4.87  3.87 - 5.11 MIL/uL   Retic Count, Manual 92.5  19.0 - 186.0 K/uL  VITAMIN B12     Status: None   Collection Time    11/14/12 11:13 AM      Result Value Range   Vitamin B-12 599  211 - 911 pg/mL  TSH     Status: None   Collection Time    11/14/12 11:13 AM      Result Value Range   TSH 1.181  0.350 - 4.500 uIU/mL  HEMOGLOBIN A1C     Status: Abnormal   Collection Time    11/14/12 11:35 AM      Result Value Range   Hemoglobin A1C 5.7 (*) <5.7 %   Mean Plasma Glucose 117 (*) <117 mg/dL  URINE RAPID DRUG SCREEN (HOSP PERFORMED)     Status: None   Collection Time    11/14/12 11:42 AM      Result Value Range   Opiates NONE DETECTED  NONE DETECTED   Cocaine NONE DETECTED  NONE DETECTED   Benzodiazepines NONE DETECTED  NONE DETECTED   Amphetamines NONE DETECTED  NONE DETECTED   Tetrahydrocannabinol NONE DETECTED  NONE DETECTED   Barbiturates NONE DETECTED  NONE DETECTED  TROPONIN I     Status: Abnormal   Collection Time    11/14/12  1:01 PM      Result Value Range    Troponin I 2.34 (*) <0.30 ng/mL  PRO B NATRIURETIC PEPTIDE     Status: None   Collection Time    11/14/12  1:02 PM      Result Value Range   Pro B Natriuretic peptide (BNP) 108.1  0 - 125 pg/mL  TROPONIN I     Status: Abnormal  Collection Time    11/14/12  1:02 PM      Result Value Range   Troponin I 2.25 (*) <0.30 ng/mL  TYPE AND SCREEN     Status: None   Collection Time    11/14/12  1:05 PM      Result Value Range   ABO/RH(D) O POS     Antibody Screen NEG     Sample Expiration 11/17/2012     Unit Number [id number]     Blood Component Type RBC LR PHER2     Unit division 00     Status of Unit ISSUED     Transfusion Status OK TO TRANSFUSE     Crossmatch Result Compatible    PREPARE RBC (CROSSMATCH)     Status: None   Collection Time    11/14/12  1:05 PM      Result Value Range   Order Confirmation ORDER PROCESSED BY BLOOD BANK    ABO/RH     Status: None   Collection Time    11/14/12  1:05 PM      Result Value Range   ABO/RH(D) O POS    MRSA PCR SCREENING     Status: None   Collection Time    11/14/12  1:33 PM      Result Value Range   MRSA by PCR NEGATIVE  NEGATIVE  PRO B NATRIURETIC PEPTIDE     Status: None   Collection Time    11/14/12  2:30 PM      Result Value Range   Pro B Natriuretic peptide (BNP) 111.4  0 - 125 pg/mL  PROTIME-INR     Status: None   Collection Time    11/14/12  2:30 PM      Result Value Range   Prothrombin Time 14.4  11.6 - 15.2 seconds   INR 1.14  0.00 - 1.49  APTT     Status: Abnormal   Collection Time    11/14/12  2:30 PM      Result Value Range   aPTT 84 (*) 24 - 37 seconds  SEDIMENTATION RATE     Status: Abnormal   Collection Time    11/14/12  2:30 PM      Result Value Range   Sed Rate 49 (*) 0 - 22 mm/hr  CBC     Status: Abnormal   Collection Time    11/14/12  8:00 PM      Result Value Range   WBC 12.2 (*) 4.0 - 10.5 K/uL   RBC 5.36 (*) 3.87 - 5.11 MIL/uL   Hemoglobin 9.2 (*) 12.0 - 15.0 g/dL   HCT 11.9 (*) 14.7 - 82.9  %   MCV 61.0 (*) 78.0 - 100.0 fL   MCH 17.2 (*) 26.0 - 34.0 pg   MCHC 28.1 (*) 30.0 - 36.0 g/dL   RDW 56.2 (*) 13.0 - 86.5 %   Platelets 673 (*) 150 - 400 K/uL  TROPONIN I     Status: Abnormal   Collection Time    11/14/12  8:17 PM      Result Value Range   Troponin I 2.33 (*) <0.30 ng/mL  HEPARIN LEVEL (UNFRACTIONATED)     Status: Abnormal   Collection Time    11/14/12  8:17 PM      Result Value Range   Heparin Unfractionated 0.15 (*) 0.30 - 0.70 IU/mL  TROPONIN I     Status: Abnormal   Collection Time    11/14/12 11:44 PM  Result Value Range   Troponin I 2.84 (*) <0.30 ng/mL  CBC     Status: Abnormal   Collection Time    11/14/12 11:44 PM      Result Value Range   WBC 11.8 (*) 4.0 - 10.5 K/uL   RBC 5.06  3.87 - 5.11 MIL/uL   Hemoglobin 9.0 (*) 12.0 - 15.0 g/dL   HCT 40.9 (*) 81.1 - 91.4 %   MCV 60.7 (*) 78.0 - 100.0 fL   MCH 17.8 (*) 26.0 - 34.0 pg   MCHC 29.3 (*) 30.0 - 36.0 g/dL   RDW 78.2 (*) 95.6 - 21.3 %   Platelets 730 (*) 150 - 400 K/uL  BASIC METABOLIC PANEL     Status: Abnormal   Collection Time    11/14/12 11:44 PM      Result Value Range   Sodium 134 (*) 135 - 145 mEq/L   Potassium 3.8  3.5 - 5.1 mEq/L   Chloride 100  96 - 112 mEq/L   CO2 24  19 - 32 mEq/L   Glucose, Bld 105 (*) 70 - 99 mg/dL   BUN 9  6 - 23 mg/dL   Creatinine, Ser 0.86  0.50 - 1.10 mg/dL   Calcium 9.4  8.4 - 57.8 mg/dL   GFR calc non Af Amer >90  >90 mL/min   GFR calc Af Amer >90  >90 mL/min  HEPARIN LEVEL (UNFRACTIONATED)     Status: None   Collection Time    11/14/12 11:44 PM      Result Value Range   Heparin Unfractionated 0.40  0.30 - 0.70 IU/mL  LIPID PANEL     Status: Abnormal   Collection Time    11/14/12 11:44 PM      Result Value Range   Cholesterol 211 (*) 0 - 200 mg/dL   Triglycerides 469  <629 mg/dL   HDL 44  >52 mg/dL   Total CHOL/HDL Ratio 4.8     VLDL 24  0 - 40 mg/dL   LDL Cholesterol 841 (*) 0 - 99 mg/dL  HEPARIN LEVEL (UNFRACTIONATED)     Status:  Abnormal   Collection Time    11/15/12  5:40 AM      Result Value Range   Heparin Unfractionated 0.18 (*) 0.30 - 0.70 IU/mL  TROPONIN I     Status: Abnormal   Collection Time    11/15/12  5:40 AM      Result Value Range   Troponin I 1.58 (*) <0.30 ng/mL    Signed: Elfredia Nevins, MD 11/15/2012, 10:10 AM   Time Spent on Discharge: 20 minutes Services Ordered on Discharge: N/A (left AMA) Equipment Ordered on Discharge: N/A (left AMA)

## 2012-11-15 NOTE — Progress Notes (Signed)
Subjective:    Patient denies CP this AM. She states she is leaving AMA to attend her son's graduation, but plans to return to the ED this evening for further medical care. She was counseled of the risks involved with this decision and was instructed to avoid physical exertion in the interim prior to her return.  Interval Events: No acute events.    Objective:    Vital Signs:   Temp:  [97.9 F (36.6 C)-98.6 F (37 C)] 98.3 F (36.8 C) (06/15 0737) Pulse Rate:  [71-84] 82 (06/14 1240) Resp:  [14-17] 16 (06/15 0737) BP: (100-153)/(65-99) 117/75 mmHg (06/15 0739) SpO2:  [92 %-100 %] 99 % (06/15 0737) Weight:  [250 lb 14.1 oz (113.8 kg)] 250 lb 14.1 oz (113.8 kg) (06/14 1240) Last BM Date: 11/14/12  24-hour weight change: Weight change:   Intake/Output:   Intake/Output Summary (Last 24 hours) at 11/15/12 1021 Last data filed at 11/15/12 0930  Gross per 24 hour  Intake 1141.09 ml  Output   3650 ml  Net -2508.91 ml      Physical Exam: General: Vital signs reviewed and noted. Well-developed, well-nourished, in no acute distress; alert, appropriate and cooperative throughout examination.  Lungs:  Normal respiratory effort. Clear to auscultation BL without crackles or wheezes.  Heart: RRR. S1 and S2 normal without gallop, murmur, or rubs.  Abdomen:  BS normoactive. Soft, Nondistended, non-tender.  No masses or organomegaly.  Extremities: No pretibial edema.     Labs:  Basic Metabolic Panel:  Recent Labs Lab 11/14/12 0806 11/14/12 2344  NA 137 134*  K 3.7 3.8  CL 103 100  CO2 25 24  GLUCOSE 95 105*  BUN 7 9  CREATININE 0.66 0.79  CALCIUM 9.2 9.4    Liver Function Tests:  Recent Labs Lab 11/14/12 0806  AST 16  ALT 9  ALKPHOS 66  BILITOT 0.2*  PROT 7.5  ALBUMIN 3.0*    CBC:  Recent Labs Lab 11/14/12 0806 11/14/12 2000 11/14/12 2344  WBC 9.9 12.2* 11.8*  NEUTROABS 5.6  --   --   HGB 7.6* 9.2* 9.0*  HCT 27.3* 32.7* 30.7*  MCV 59.5* 61.0*  60.7*  PLT 689* 673* 730*    Cardiac Enzymes:  Recent Labs Lab 11/14/12 1301 11/14/12 1302 11/14/12 2017 11/14/12 2344 11/15/12 0540  TROPONINI 2.34* 2.25* 2.33* 2.84* 1.58*    Microbiology: Results for orders placed during the hospital encounter of 11/14/12  MRSA PCR SCREENING     Status: None   Collection Time    11/14/12  1:33 PM      Result Value Range Status   MRSA by PCR NEGATIVE  NEGATIVE Final   Comment:            The GeneXpert MRSA Assay (FDA     approved for NASAL specimens     only), is one component of a     comprehensive MRSA colonization     surveillance program. It is not     intended to diagnose MRSA     infection nor to guide or     monitor treatment for     MRSA infections.    Coagulation Studies:  Recent Labs  11/14/12 1430  LABPROT 14.4  INR 1.14     Imaging: Dg Chest 2 View  11/14/2012   *RADIOLOGY REPORT*  Clinical Data: Chest pain  CHEST - 2 VIEW  Comparison: None.  Findings: The lungs are well-aerated and free from pulmonary edema, focal airspace consolidation  or pulmonary nodule.  Cardiac and mediastinal contours are within normal limits.  No pneumothorax, or pleural effusion. No acute osseous findings.  IMPRESSION:  No acute cardiopulmonary disease.   Original Report Authenticated By: Malachy Moan, M.D.       Medications:    Infusions: . sodium chloride 10 mL (11/14/12 2245)    Scheduled Medications: . aspirin EC  81 mg Oral Daily  . atorvastatin  80 mg Oral q1800  . sodium chloride  3 mL Intravenous Q12H    PRN Medications: sodium chloride, acetaminophen, morphine injection, nitroGLYCERIN, ondansetron (ZOFRAN) IV, sodium chloride   Assessment/ Plan:   NSTEMI - discussed the patient's decision to leave AMA (and with plans to return) with cardiology. The decision was reached to administer lovenox prior to her leaving, and to resume IV heparin per pharmacy when she returns. Any further plans will be deferred until the  patient returns.   Anemia - Hb = 9.0 after 1U PRBCs. Ferritin = 1, consistent with iron deficiency. She will need iron replacement therapy going forward.   Dispo - leaving AMA. See d/c summary for details.   Length of Stay: 1 day(s)   Signed: Elfredia Nevins, MD  PGY-1, Internal Medicine Resident Pager: [phone] (7AM-5PM) 11/15/2012, 10:21 AM

## 2012-11-15 NOTE — ED Notes (Signed)
Pt at desk now stating she is having some chest pain, states it started approx 5 min ago, pt taken to triage room for EKG to be obtained.

## 2012-11-15 NOTE — ED Notes (Signed)
Pt in stating she was admitted to hospital on 6/14 for a heart attack, states they allowed her today to discharge herself due to her son graduating from high school today, pt states she was instructed to come back to the emergency room tonight to be re-admitted so that she could be started back on heparin to have her heart catheterization tomorrow morning. Pt denies chest pain at this time.

## 2012-11-15 NOTE — Progress Notes (Signed)
Pt signed out AMA, IVs pulled. Pt departed unit with all belongings

## 2012-11-15 NOTE — Progress Notes (Signed)
ANTICOAGULATION CONSULT NOTE - Follow Up Consult  Pharmacy Consult for heparin Indication: NSTEMI  No Known Allergies  Patient Measurements: Height: 5\' 3"  (160 cm) Weight: 250 lb 14.1 oz (113.8 kg) IBW/kg (Calculated) : 52.4 Heparin Dosing Weight: 79.5kg  Vital Signs: Temp: 98.6 F (37 C) (06/15 0350) Temp src: Oral (06/15 0350) BP: 137/85 mmHg (06/15 0350)  Labs:  Recent Labs  11/14/12 0806  11/14/12 1302 11/14/12 1430 11/14/12 2000 11/14/12 2017 11/14/12 2344 11/15/12 0540  HGB 7.6*  --   --   --  9.2*  --  9.0*  --   HCT 27.3*  --   --   --  32.7*  --  30.7*  --   PLT 689*  --   --   --  673*  --  730*  --   APTT  --   --   --  84*  --   --   --   --   LABPROT  --   --   --  14.4  --   --   --   --   INR  --   --   --  1.14  --   --   --   --   HEPARINUNFRC  --   --   --   --   --  0.15* 0.40 0.18*  CREATININE 0.66  --   --   --   --   --  0.79  --   TROPONINI  --   < > 2.25*  --   --  2.33* 2.84*  --   < > = values in this interval not displayed.  Estimated Creatinine Clearance: 112.5 ml/min (by C-G formula based on Cr of 0.79).   Medications:  Scheduled:  . aspirin EC  81 mg Oral Daily  . atorvastatin  80 mg Oral q1800  . sodium chloride  3 mL Intravenous Q12H    Assessment: 42 yo female with NSTEMI on heparin. Heparin level remains low 0.18. No issues with line or bleeding per RN.  Goal of Therapy:  Heparin level 0.3-0.7 units/ml Monitor platelets by anticoagulation protocol: Yes   Plan:  -Heparin 2000 unit bolus then increase infusion to 1500 units/hr -Heparin level in 6hrs  Christoper Fabian, PharmD, BCPS Clinical pharmacist, pager [phone]  11/15/2012 6:31 AM

## 2012-11-15 NOTE — H&P (Signed)
Date: 11/15/2012  Patient name: Abigail Garcia  Medical record number: [id number]  Date of birth: 06-24-1970   I have seen and evaluated Abigail Garcia and discussed their care with the Residency Team. Abigail Garcia has a h/o RA, now in remission, who presented with CP. Present for 2 days that was a substernal soreness and gradually resolved without intervention. On day of admission, she had substernal CP that was more severe and didn't abate and she called 911.   Physical Exam: Blood pressure 117/75, pulse 82, temperature 98.3 F (36.8 C), temperature source Oral, resp. rate 16, height 5\' 3"  (1.6 m), weight 250 lb 14.1 oz (113.8 kg), last menstrual period 11/03/2012, SpO2 99.00%. General appearance: alert, cooperative, appears stated age, no distress and mildly obese Head: Normocephalic, without obvious abnormality, atraumatic Eyes: sclera clear, EOMI Lungs: clear to auscultation bilaterally Heart: regular rate and rhythm, S1, S2 normal, no murmur, click, rub or gallop Abdomen: soft, non-tender; bowel sounds normal; no masses,  no organomegaly Extremities: extremities normal, atraumatic, no cyanosis or edema Pulses: 2+ and symmetric Skin: Skin color, texture, turgor normal. No rashes or lesions Neuro : gait nl Pysch : nl affect and mood. Good judgement and medical understanding  Lab results: Results for orders placed during the hospital encounter of 11/14/12 (from the past 24 hour(s))  MRSA PCR SCREENING     Status: None   Collection Time    11/14/12  1:33 PM      Result Value Range   MRSA by PCR NEGATIVE  NEGATIVE  PRO B NATRIURETIC PEPTIDE     Status: None   Collection Time    11/14/12  2:30 PM      Result Value Range   Pro B Natriuretic peptide (BNP) 111.4  0 - 125 pg/mL  PROTIME-INR     Status: None   Collection Time    11/14/12  2:30 PM      Result Value Range   Prothrombin Time 14.4  11.6 - 15.2 seconds   INR 1.14  0.00 - 1.49  APTT     Status: Abnormal   Collection Time   11/14/12  2:30 PM      Result Value Range   aPTT 84 (*) 24 - 37 seconds  SEDIMENTATION RATE     Status: Abnormal   Collection Time    11/14/12  2:30 PM      Result Value Range   Sed Rate 49 (*) 0 - 22 mm/hr  CBC     Status: Abnormal   Collection Time    11/14/12  8:00 PM      Result Value Range   WBC 12.2 (*) 4.0 - 10.5 K/uL   RBC 5.36 (*) 3.87 - 5.11 MIL/uL   Hemoglobin 9.2 (*) 12.0 - 15.0 g/dL   HCT 40.9 (*) 81.1 - 91.4 %   MCV 61.0 (*) 78.0 - 100.0 fL   MCH 17.2 (*) 26.0 - 34.0 pg   MCHC 28.1 (*) 30.0 - 36.0 g/dL   RDW 78.2 (*) 95.6 - 21.3 %   Platelets 673 (*) 150 - 400 K/uL  TROPONIN I     Status: Abnormal   Collection Time    11/14/12  8:17 PM      Result Value Range   Troponin I 2.33 (*) <0.30 ng/mL  HEPARIN LEVEL (UNFRACTIONATED)     Status: Abnormal   Collection Time    11/14/12  8:17 PM      Result Value Range   Heparin Unfractionated  0.15 (*) 0.30 - 0.70 IU/mL  TROPONIN I     Status: Abnormal   Collection Time    11/14/12 11:44 PM      Result Value Range   Troponin I 2.84 (*) <0.30 ng/mL  CBC     Status: Abnormal   Collection Time    11/14/12 11:44 PM      Result Value Range   WBC 11.8 (*) 4.0 - 10.5 K/uL   RBC 5.06  3.87 - 5.11 MIL/uL   Hemoglobin 9.0 (*) 12.0 - 15.0 g/dL   HCT 16.1 (*) 09.6 - 04.5 %   MCV 60.7 (*) 78.0 - 100.0 fL   MCH 17.8 (*) 26.0 - 34.0 pg   MCHC 29.3 (*) 30.0 - 36.0 g/dL   RDW 40.9 (*) 81.1 - 91.4 %   Platelets 730 (*) 150 - 400 K/uL  BASIC METABOLIC PANEL     Status: Abnormal   Collection Time    11/14/12 11:44 PM      Result Value Range   Sodium 134 (*) 135 - 145 mEq/L   Potassium 3.8  3.5 - 5.1 mEq/L   Chloride 100  96 - 112 mEq/L   CO2 24  19 - 32 mEq/L   Glucose, Bld 105 (*) 70 - 99 mg/dL   BUN 9  6 - 23 mg/dL   Creatinine, Ser 7.82  0.50 - 1.10 mg/dL   Calcium 9.4  8.4 - 95.6 mg/dL   GFR calc non Af Amer >90  >90 mL/min   GFR calc Af Amer >90  >90 mL/min  HEPARIN LEVEL (UNFRACTIONATED)     Status: None    Collection Time    11/14/12 11:44 PM      Result Value Range   Heparin Unfractionated 0.40  0.30 - 0.70 IU/mL  LIPID PANEL     Status: Abnormal   Collection Time    11/14/12 11:44 PM      Result Value Range   Cholesterol 211 (*) 0 - 200 mg/dL   Triglycerides 213  <086 mg/dL   HDL 44  >57 mg/dL   Total CHOL/HDL Ratio 4.8     VLDL 24  0 - 40 mg/dL   LDL Cholesterol 846 (*) 0 - 99 mg/dL  HEPARIN LEVEL (UNFRACTIONATED)     Status: Abnormal   Collection Time    11/15/12  5:40 AM      Result Value Range   Heparin Unfractionated 0.18 (*) 0.30 - 0.70 IU/mL  TROPONIN I     Status: Abnormal   Collection Time    11/15/12  5:40 AM      Result Value Range   Troponin I 1.58 (*) <0.30 ng/mL    Imaging results:  Dg Chest 2 View  11/14/2012   *RADIOLOGY REPORT*  Clinical Data: Chest pain  CHEST - 2 VIEW  Comparison: None.  Findings: The lungs are well-aerated and free from pulmonary edema, focal airspace consolidation or pulmonary nodule.  Cardiac and mediastinal contours are within normal limits.  No pneumothorax, or pleural effusion. No acute osseous findings.  IMPRESSION:  No acute cardiopulmonary disease.   Original Report Authenticated By: Malachy Moan, M.D.    Assessment and Plan: I have seen and evaluated the patient as outlined above. I agree with the formulated Assessment and Plan as detailed in the residents' admission note, with the following changes:   1. NSTEMI - EKG shows inf T wave inversion. Trop I have peaked and are trending down. She has been  on Heparin, statin, and ASA. No BB 2/2 bradycardia. Cards planning cath on Monday.   2. Iron Def anemia - severe - pt states menses are heavy but unable to quantify, last 5-7 days. Required PRBC and FESO4 during preg but stopped 2/2 constipation. Pt has received 1 unit PRBC to get HgB above 8 in setting of AMI. Will need outpt F/U and further discussion of possible GI loss.  3. RA - pt states dx 2/2 joint sxs and blood work in [place] age  42. Was tx with prednisone and MTX and now in remission. No joint sxs now. Can increase risk of AMI and PE (which can cause increase in trop I) but Well's score is low prob.   4. Thrombocytosis - likely reactive 2/2 severe iron def anemia. Correct and see if Plts normalize.  Burns Spain, MD 6/15/20141:31 PM

## 2012-11-15 NOTE — Progress Notes (Signed)
Patient ID: Abigail Garcia, female   DOB: 1971/05/12, 42 y.o.   MRN: [id number]    SUBJECTIVE: No further chest pain.  TnI peaked at 2.64.     Filed Vitals:   11/15/12 0016 11/15/12 0350 11/15/12 0737 11/15/12 0739  BP: 149/97 137/85  117/75  Pulse:      Temp: 98.2 F (36.8 C) 98.6 F (37 C) 98.3 F (36.8 C)   TempSrc: Oral Oral Oral   Resp: 16 16 16    Height:      Weight:      SpO2: 98% 97% 99%     Intake/Output Summary (Last 24 hours) at 11/15/12 0955 Last data filed at 11/15/12 0930  Gross per 24 hour  Intake 1141.09 ml  Output   3650 ml  Net -2508.91 ml    LABS: Basic Metabolic Panel:  Recent Labs  56/38/75 0806 11/14/12 2344  NA 137 134*  K 3.7 3.8  CL 103 100  CO2 25 24  GLUCOSE 95 105*  BUN 7 9  CREATININE 0.66 0.79  CALCIUM 9.2 9.4   Liver Function Tests:  Recent Labs  11/14/12 0806  AST 16  ALT 9  ALKPHOS 66  BILITOT 0.2*  PROT 7.5  ALBUMIN 3.0*   No results found for this basename: LIPASE, AMYLASE,  in the last 72 hours CBC:  Recent Labs  11/14/12 0806 11/14/12 2000 11/14/12 2344  WBC 9.9 12.2* 11.8*  NEUTROABS 5.6  --   --   HGB 7.6* 9.2* 9.0*  HCT 27.3* 32.7* 30.7*  MCV 59.5* 61.0* 60.7*  PLT 689* 673* 730*   Cardiac Enzymes:  Recent Labs  11/14/12 2017 11/14/12 2344 11/15/12 0540  TROPONINI 2.33* 2.84* 1.58*   BNP: No components found with this basename: POCBNP,  D-Dimer: No results found for this basename: DDIMER,  in the last 72 hours Hemoglobin A1C:  Recent Labs  11/14/12 1135  HGBA1C 5.7*   Fasting Lipid Panel:  Recent Labs  11/14/12 2344  CHOL 211*  HDL 44  LDLCALC 143*  TRIG 118  CHOLHDL 4.8   Thyroid Function Tests:  Recent Labs  11/14/12 1113  TSH 1.181   Anemia Panel:  Recent Labs  11/14/12 1113  VITAMINB12 599  FOLATE 19.0  FERRITIN 1*  TIBC Not calculated due to Iron <10.  IRON <10*  RETICCTPCT 1.9    RADIOLOGY: Dg Chest 2 View  11/14/2012   *RADIOLOGY REPORT*  Clinical  Data: Chest pain  CHEST - 2 VIEW  Comparison: None.  Findings: The lungs are well-aerated and free from pulmonary edema, focal airspace consolidation or pulmonary nodule.  Cardiac and mediastinal contours are within normal limits.  No pneumothorax, or pleural effusion. No acute osseous findings.  IMPRESSION:  No acute cardiopulmonary disease.   Original Report Authenticated By: Malachy Moan, M.D.    PHYSICAL EXAM General: NAD Neck: No JVD, no thyromegaly or thyroid nodule.  Lungs: Clear to auscultation bilaterally with normal respiratory effort. CV: Nondisplaced PMI.  Heart regular S1/S2, no S3/S4, no murmur.  No peripheral edema.   Abdomen: Soft, nontender, no hepatosplenomegaly, no distention.  Neurologic: Alert and oriented x 3.  Psych: Normal affect. Extremities: No clubbing or cyanosis.   TELEMETRY: Reviewed telemetry pt in NSR  ASSESSMENT AND PLAN: 42 yo with history of RA and anemia presented with substernal chest soreness and elevated troponin.  1. NSTEMI: Elevated TnI to 2.64. She has had chest soreness on and off since Thursday. Currently pain-free. Pain is not pleuritic, no factors  pointing towards PE. She does have rheumatoid arthritis. This increases her risk for CAD and also can be associated with myocarditis or pericarditis. She does not have a family history of premature CAD or any risk factors for CAD other than RA.  Echo yesterday showed normal EF and no evidence for pericardial effusion.  - Agree with ASA, heparin gtt, atorvastatin 80.  - I think that she will need LHC to rule out significant CAD. Will arrange for Monday. 2. RA: No joint pains. Would be unusual to develop myopericarditis in the absence of active joint symptoms. ESR is moderately elevated at 49.  3. Anemia: Hemoglobin 9 after transfusion.  She has Fe-deficiency anemia by iron indices.  No overt bleeding.   Patient insists on discharge AMA today to go to son's graduation.  The primary team and I both  explained to her the danger of leaving the hospital before cardiac catheterization and potential intervention (planned for tomorrow).  She is insistent on leaving and understands the danger.  She will get a Lovenox injection prior to discharge and we have urged her to return to the hospital after graduation for cath tomorrow.   [name] 11/15/2012 9:59 AM

## 2012-11-16 ENCOUNTER — Encounter (HOSPITAL_COMMUNITY)
Admission: EM | Disposition: A | Discharge status: Home / Self Care | Payer: Self-pay | Source: Home / Self Care | Attending: Internal Medicine

## 2012-11-16 ENCOUNTER — Encounter (HOSPITAL_COMMUNITY): Payer: Self-pay | Admitting: Internal Medicine

## 2012-11-16 ENCOUNTER — Emergency Department (HOSPITAL_COMMUNITY): Payer: Medicaid Other

## 2012-11-16 DIAGNOSIS — I2510 Atherosclerotic heart disease of native coronary artery without angina pectoris: Secondary | ICD-10-CM

## 2012-11-16 HISTORY — PX: LEFT HEART CATHETERIZATION WITH CORONARY ANGIOGRAM: SHX5451

## 2012-11-16 LAB — CBC
HCT: 29.4 % — ABNORMAL LOW (ref 36.0–46.0)
HCT: 32 % — ABNORMAL LOW (ref 36.0–46.0)
Hemoglobin: 8.5 g/dL — ABNORMAL LOW (ref 12.0–15.0)
MCHC: 28.4 g/dL — ABNORMAL LOW (ref 30.0–36.0)
MCHC: 28.9 g/dL — ABNORMAL LOW (ref 30.0–36.0)
MCV: 61.3 fL — ABNORMAL LOW (ref 78.0–100.0)
Platelets: 547 10*3/uL — ABNORMAL HIGH (ref 150–400)
RDW: 23 % — ABNORMAL HIGH (ref 11.5–15.5)
WBC: 12.1 10*3/uL — ABNORMAL HIGH (ref 4.0–10.5)

## 2012-11-16 LAB — BASIC METABOLIC PANEL
BUN: 13 mg/dL (ref 6–23)
Chloride: 103 mEq/L (ref 96–112)
GFR calc Af Amer: >90 mL/min (ref >90)
GFR calc non Af Amer: >90 mL/min (ref >90)
Glucose, Bld: 97 mg/dL (ref 70–99)
Potassium: 4 mEq/L (ref 3.5–5.1)
Sodium: 137 mEq/L (ref 135–145)

## 2012-11-16 LAB — ANTITHROMBIN III: AntiThromb III Func: 85 % (ref 75–120)

## 2012-11-16 LAB — COMPREHENSIVE METABOLIC PANEL
AST: 17 U/L (ref 0–37)
Albumin: 3.4 g/dL — ABNORMAL LOW (ref 3.5–5.2)
BUN: 13 mg/dL (ref 6–23)
Creatinine, Ser: 0.76 mg/dL (ref 0.50–1.10)
Potassium: 3.7 mEq/L (ref 3.5–5.1)
Total Protein: 8.2 g/dL (ref 6.0–8.3)

## 2012-11-16 LAB — POCT I-STAT, CHEM 8
BUN: 13 mg/dL (ref 6–23)
Chloride: 105 mEq/L (ref 96–112)
Sodium: 141 mEq/L (ref 135–145)

## 2012-11-16 LAB — APTT: aPTT: 79 seconds — ABNORMAL HIGH (ref 24–37)

## 2012-11-16 LAB — TROPONIN I: Troponin I: 1.51 ng/mL (ref <0.30)

## 2012-11-16 LAB — HEPARIN LEVEL (UNFRACTIONATED): Heparin Unfractionated: 0.64 [IU]/mL (ref 0.30–0.70)

## 2012-11-16 LAB — PROTIME-INR: INR: 1.06 (ref 0.00–1.49)

## 2012-11-16 SURGERY — LEFT HEART CATHETERIZATION WITH CORONARY ANGIOGRAM
Anesthesia: LOCAL

## 2012-11-16 MED ORDER — HEPARIN BOLUS VIA INFUSION
2000.0000 [IU] | Freq: Once | INTRAVENOUS | Status: AC
  Administered 2012-11-16: 2000 [IU] via INTRAVENOUS

## 2012-11-16 MED ORDER — SODIUM CHLORIDE 0.9 % IV SOLN: 250.0000 mL | INTRAVENOUS | Status: DC | PRN

## 2012-11-16 MED ORDER — VERAPAMIL HCL 2.5 MG/ML IV SOLN
INTRAVENOUS | Status: AC
  Filled 2012-11-16: qty 2

## 2012-11-16 MED ORDER — HEPARIN (PORCINE) IN NACL 2-0.9 UNIT/ML-% IJ SOLN
INTRAMUSCULAR | Status: AC
  Filled 2012-11-16: qty 500

## 2012-11-16 MED ORDER — TICAGRELOR 90 MG PO TABS
90.0000 mg | ORAL_TABLET | Freq: Two times a day (BID) | ORAL | Status: DC
  Administered 2012-11-16 – 2012-11-20 (×9): 90 mg via ORAL
  Filled 2012-11-16 (×13): qty 1

## 2012-11-16 MED ORDER — LIDOCAINE HCL (PF) 1 % IJ SOLN
INTRAMUSCULAR | Status: AC
  Filled 2012-11-16: qty 30

## 2012-11-16 MED ORDER — MIDAZOLAM HCL 2 MG/2ML IJ SOLN
INTRAMUSCULAR | Status: AC
  Filled 2012-11-16: qty 2

## 2012-11-16 MED ORDER — MORPHINE SULFATE 2 MG/ML IJ SOLN: 2.0000 mg | INTRAMUSCULAR | Status: DC | PRN

## 2012-11-16 MED ORDER — SODIUM CHLORIDE 0.9 % IJ SOLN: 3.0000 mL | INTRAMUSCULAR | Status: DC | PRN

## 2012-11-16 MED ORDER — SODIUM CHLORIDE 0.9 % IV SOLN
1.0000 mL/kg/h | INTRAVENOUS | Status: AC
  Administered 2012-11-16 (×2): 1 mL/kg/h via INTRAVENOUS

## 2012-11-16 MED ORDER — ACETAMINOPHEN 325 MG PO TABS
650.0000 mg | ORAL_TABLET | Freq: Four times a day (QID) | ORAL | Status: DC | PRN
  Administered 2012-11-19: 650 mg via ORAL
  Filled 2012-11-16: qty 2

## 2012-11-16 MED ORDER — ASPIRIN EC 81 MG PO TBEC
81.0000 mg | DELAYED_RELEASE_TABLET | Freq: Every day | ORAL | Status: DC
  Administered 2012-11-17 – 2012-11-20 (×3): 81 mg via ORAL
  Filled 2012-11-16 (×5): qty 1

## 2012-11-16 MED ORDER — EPTIFIBATIDE 75 MG/100ML IV SOLN
2.0000 ug/kg/min | INTRAVENOUS | Status: DC
  Administered 2012-11-16 – 2012-11-19 (×11): 2 ug/kg/min via INTRAVENOUS
  Filled 2012-11-16 (×24): qty 100

## 2012-11-16 MED ORDER — METOPROLOL TARTRATE 12.5 MG HALF TABLET
12.5000 mg | ORAL_TABLET | Freq: Two times a day (BID) | ORAL | Status: DC
  Administered 2012-11-16 – 2012-11-20 (×9): 12.5 mg via ORAL
  Filled 2012-11-16 (×12): qty 1

## 2012-11-16 MED ORDER — SODIUM CHLORIDE 0.9 % IJ SOLN
3.0000 mL | Freq: Two times a day (BID) | INTRAMUSCULAR | Status: DC
  Administered 2012-11-18 – 2012-11-19 (×2): 3 mL via INTRAVENOUS

## 2012-11-16 MED ORDER — FERROUS SULFATE 325 (65 FE) MG PO TABS
325.0000 mg | ORAL_TABLET | Freq: Two times a day (BID) | ORAL | Status: DC
  Administered 2012-11-16 – 2012-11-17 (×2): 325 mg via ORAL
  Filled 2012-11-16 (×5): qty 1

## 2012-11-16 MED ORDER — SODIUM CHLORIDE 0.9 % IV SOLN
1.0000 mL/kg/h | INTRAVENOUS | Status: DC
Start: 2012-11-16 — End: 2012-11-16
  Administered 2012-11-16: 1 mL/kg/h via INTRAVENOUS

## 2012-11-16 MED ORDER — EPTIFIBATIDE BOLUS VIA INFUSION
180.0000 ug/kg | Freq: Once | INTRAVENOUS | Status: DC
  Filled 2012-11-16: qty 27

## 2012-11-16 MED ORDER — ACETAMINOPHEN 650 MG RE SUPP: 650.0000 mg | Freq: Four times a day (QID) | RECTAL | Status: DC | PRN

## 2012-11-16 MED ORDER — FENTANYL CITRATE 0.05 MG/ML IJ SOLN
INTRAMUSCULAR | Status: AC
  Filled 2012-11-16: qty 2

## 2012-11-16 MED ORDER — ASPIRIN 81 MG PO CHEW: 324.0000 mg | CHEWABLE_TABLET | ORAL | Status: DC

## 2012-11-16 MED ORDER — SODIUM CHLORIDE 0.9 % IJ SOLN: 3.0000 mL | Freq: Two times a day (BID) | INTRAMUSCULAR | Status: DC

## 2012-11-16 MED ORDER — ATORVASTATIN CALCIUM 80 MG PO TABS
80.0000 mg | ORAL_TABLET | Freq: Every day | ORAL | Status: DC
  Administered 2012-11-16 – 2012-11-19 (×4): 80 mg via ORAL
  Filled 2012-11-16 (×7): qty 1

## 2012-11-16 MED ORDER — HEPARIN (PORCINE) IN NACL 2-0.9 UNIT/ML-% IJ SOLN
INTRAMUSCULAR | Status: AC
  Filled 2012-11-16: qty 1000

## 2012-11-16 MED ORDER — HEPARIN (PORCINE) IN NACL 100-0.45 UNIT/ML-% IJ SOLN: 1000.0000 [IU]/h | INTRAMUSCULAR | Status: DC

## 2012-11-16 MED ORDER — HEPARIN (PORCINE) IN NACL 100-0.45 UNIT/ML-% IJ SOLN
1500.0000 [IU]/h | INTRAMUSCULAR | Status: DC
  Administered 2012-11-16: 1500 [IU]/h via INTRAVENOUS
  Filled 2012-11-16 (×2): qty 250

## 2012-11-16 MED ORDER — HEPARIN (PORCINE) IN NACL 100-0.45 UNIT/ML-% IJ SOLN
1400.0000 [IU]/h | INTRAMUSCULAR | Status: DC
  Administered 2012-11-17: 1300 [IU]/h via INTRAVENOUS
  Administered 2012-11-17: 1400 [IU]/h via INTRAVENOUS
  Administered 2012-11-17: 1300 [IU]/h via INTRAVENOUS
  Administered 2012-11-17 – 2012-11-18 (×2): 1400 [IU]/h via INTRAVENOUS
  Filled 2012-11-16 (×4): qty 250

## 2012-11-16 MED ORDER — ASPIRIN 81 MG PO CHEW
324.0000 mg | CHEWABLE_TABLET | ORAL | Status: AC
  Administered 2012-11-16: 324 mg via ORAL
  Filled 2012-11-16: qty 4

## 2012-11-16 NOTE — Progress Notes (Signed)
ANTICOAGULATION CONSULT NOTE - Initial Consult  Pharmacy Consult for Heparin  Indication: chest pain/ACS  No Known Allergies  Patient Measurements: Height: 5\' 2"  (157.5 cm) Weight: 246 lb 7.6 oz (111.8 kg) IBW/kg (Calculated) : 50.1 Heparin Dosing Weight: 80 kg   Vital Signs: Temp: 97.8 F (36.6 C) (06/16 0324) Temp src: Oral (06/16 0324) BP: 130/84 mmHg (06/16 0324) Pulse Rate: 88 (06/16 0324)  Labs:  Recent Labs  11/14/12 0806  11/14/12 1430  11/14/12 2344 11/15/12 0540 11/16/12 0009 11/16/12 0046 11/16/12 0737  HGB 7.6*  --   --   < > 9.0*  --  9.1* 11.6* 8.5*  HCT 27.3*  --   --   < > 30.7*  --  32.0* 34.0* 29.4*  PLT 689*  --   --   < > 730*  --  547*  --  665*  APTT  --   --  84*  --   --   --   --   --  79*  LABPROT  --   --  14.4  --   --   --   --   --  13.7  INR  --   --  1.14  --   --   --   --   --  1.06  HEPARINUNFRC  --   --   --   < > 0.40 0.18*  --   --  0.64  CREATININE 0.66  --   --   --  0.79  --  0.76 0.90 0.68  TROPONINI  --   < >  --   < > 2.84* 1.58* 1.51*  --   --   < > = values in this interval not displayed.  Estimated Creatinine Clearance: 109.3 ml/min (by C-G formula based on Cr of 0.68).   Medical History: Past Medical History  Diagnosis Date  . RA (rheumatoid arthritis)     Medications:  None  Assessment: 42 yo female with NSTEMI for heparin.  Patient admitted 6/14, but left AMA 6/15 to attend son's high school graduation. She received Lovenox 110 mg at 0945 6/15 prior to leaving. Heparin was restarted last night at 1500 units/hr, heparin level therapeutic this morning. hgb 8.5, plt 665, no bleeding noted   Goal of Therapy:  Heparin level 0.3-0.7 units/ml Monitor platelets by anticoagulation protocol: Yes   Plan:  - No change for heparin rate - f/u plans after cath  Bayard Hugger, PharmD, BCPS  Clinical Pharmacist  Pager: [phone]   11/16/2012,8:37 AM

## 2012-11-16 NOTE — ED Notes (Signed)
Report called to unit 2000.  Awaiting admitting orders to send the patient to the floor.

## 2012-11-16 NOTE — H&P (Signed)
Date: 11/16/2012               Patient Name:  Abigail Garcia MRN: [id number]  DOB: [birth year] Age / Sex: 42 y.o., female   PCP: No Pcp Per Patient         Medical Service: Internal Medicine Teaching Service         Attending Physician: Dr. Kem Kays    First Contact: Dr. Collier Bullock Pager: [phone]  Second Contact: Dr. Clyde Lundborg Pager: [phone]       After Hours (After 5p/  First Contact Pager: [phone]  weekends / holidays): Second Contact Pager: [phone]   Chief Complaint: chest pain  Of note, this is her second presentation to the ED. The first presentation on was on 11/14/2012 at 7:29 AM. On the following day of 11/15/2012 she left AMA at 10 am before complete workup and treatment management for NSTEMI. She currently denies any symptoms during the interim periods she had been home. No chest pain no, no SOB. She feels fine.   History of Present Illness (on first presentation on 11/14/2012): Patient is a 42 year old woman with PMH significant for rheumatoid arthritis not currently on any medications who present had earlier presented to the ED with complaints of chest pain. The patient states that she had an episode of midsternal chest pain 2 days prior to admission which she describes as feeling "sore" that was moderate/severe on onset, however resolved slowly over 48 hours. She states she had a subsequent episode of similar chest pain on the morning of admission that was slightly more severe than her previous episode and did not resolve, which prompted her to call EMS. She states the chest pain radiated through her chest to her back and also down her left arm. She states the pain is somewhat improved after receiving nitroglycerin in the ED. She denies any shortness of breath, diaphoresis, nausea, or lightheadedness with either episode of chest pain. She denies leg swelling. She denies any recent change in her activity level or recent immobilization. Denies any recent travel.    Meds: No Outpatient medication.     Allergies: Allergies as of 11/15/2012  . (No Known Allergies)   Past Medical History  Diagnosis Date  . RA (rheumatoid arthritis)    Past Surgical History  Procedure Laterality Date  . Cesarean section     Family History  Problem Relation Age of Onset  . Heart disease      No cardiac disease that she can think of   History   Social History  . Marital Status: Single    Spouse Name: N/A    Number of Children: N/A  . Years of Education: N/A   Occupational History  . Not on file.   Social History Main Topics  . Smoking status: Never Smoker   . Smokeless tobacco: Not on file  . Alcohol Use: No  . Drug Use: No  . Sexually Active: Not on file   Other Topics Concern  . Not on file   Social History Narrative  . No narrative on file    Review of Systems: Review of Systems  Constitutional: Negative for fever, chills and malaise/fatigue.  HENT: Negative.   Eyes: Negative.   Respiratory: Negative for cough and hemoptysis.   Cardiovascular: Initial chest completely resolved. Negative for palpitations, orthopnea and leg swelling.  Gastrointestinal: Negative for nausea, vomiting, abdominal pain, blood in stool and melena.  Genitourinary: Negative for dysuria and hematuria.  Musculoskeletal: Negative for myalgias and joint pain.  Skin: Negative.   Neurological: No focal neurologic symptoms. Negative for dizziness.  Endo/Heme/Allergies: Negative.   Psychiatric/Behavioral: Negative.      Physical Exam: Blood pressure 130/84, pulse 88, temperature 97.8 F (36.6 C), temperature source Oral, resp. rate 18, height 5\' 2"  (1.575 m), weight 246 lb 7.6 oz (111.8 kg), last menstrual period 11/03/2012, SpO2 100.00%.  Physical Exam  Constitutional: She is oriented to person, place, and time and well-developed, well-nourished, and in no distress. No distress.  HENT:  Head: Normocephalic and atraumatic.  Eyes: Conjunctivae are normal. Pupils are equal, round, and reactive to  light. No scleral icterus.  Neck: Normal range of motion. Neck supple. No tracheal deviation present.  Cardiovascular: Normal rate and regular rhythm.   No murmur heard. Pulmonary/Chest: Effort normal. She has no wheezes. She has no rales.  Abdominal: Soft. Bowel sounds are normal. She exhibits no distension. There is no tenderness.  Musculoskeletal: Normal range of motion. She exhibits no edema and no tenderness.  Neurological: She is alert and oriented to person, place, and time. No cranial nerve deficit.  Skin: Skin is warm and dry. Tattooed skin.  She is not diaphoretic. No erythema.  Psychiatric: Affect and judgment normal.    Lab results: Basic Metabolic Panel:  Recent Labs  82/95/62 2344 11/16/12 0009 11/16/12 0046  NA 134* 136 141  K 3.8 3.7 3.9  CL 100 99 105  CO2 24 24  --   GLUCOSE 105* 105* 104*  BUN 9 13 13   CREATININE 0.79 0.76 0.90  CALCIUM 9.4 9.6  --    Liver Function Tests:  Recent Labs  11/14/12 0806 11/16/12 0009  AST 16 17  ALT 9 10  ALKPHOS 66 77  BILITOT 0.2* 0.2*  PROT 7.5 8.2  ALBUMIN 3.0* 3.4*   CBC:  Recent Labs  11/14/12 0806  11/14/12 2344 11/16/12 0009 11/16/12 0046  WBC 9.9  < > 11.8* 13.0*  --   NEUTROABS 5.6  --   --   --   --   HGB 7.6*  < > 9.0* 9.1* 11.6*  HCT 27.3*  < > 30.7* 32.0* 34.0*  MCV 59.5*  < > 60.7* 61.3*  --   PLT 689*  < > 730* 547*  --   < > = values in this interval not displayed. Cardiac Enzymes:  Recent Labs  11/14/12 2344 11/15/12 0540 11/16/12 0009  TROPONINI 2.84* 1.58* 1.51*   Iron/TIBC/Ferritin    Component Value Date/Time   IRON <10* 11/14/2012 1113   TIBC Not calculated due to Iron <10. 11/14/2012 1113   FERRITIN 1* 11/14/2012 1113    Imaging results:  Dg Chest 2 View  11/14/2012   *RADIOLOGY REPORT*  Clinical Data: Chest pain  CHEST - 2 VIEW  Comparison: None.  Findings: The lungs are well-aerated and free from pulmonary edema, focal airspace consolidation or pulmonary nodule.   Cardiac and mediastinal contours are within normal limits.  No pneumothorax, or pleural effusion. No acute osseous findings.  IMPRESSION:  No acute cardiopulmonary disease.   Original Report Authenticated By: Malachy Moan, M.D.   Dg Chest Portable 1 View  11/16/2012   *RADIOLOGY REPORT*  Clinical Data: Mid chest pain  PORTABLE CHEST - 1 VIEW  Comparison: 11/14/2012  Findings: Cardiac leads project over the chest.  Heart size is normal and stable.  Lung volumes are low.  There is peribronchial thickening bilaterally.  No focal consolidation, edema, or pleural effusion.  IMPRESSION: Mild peribronchial thickening bilaterally.  This can  be seen in the setting of acute or chronic bronchitis, smoking, or asthma.  It may be accentuated by the patient's low lung volumes.   Original Report Authenticated By: Britta Mccreedy, M.D.    Other results: EKG (on 11/14/2012): NSR with HR ~90. No ST elevation. Q-wave and TWI in lead III. Poor R-wave progression.   Assessment & Plan by Problem: Patient Active Problem List   Diagnosis Date Noted  . Rheumatoid arthritis 11/14/2012  . NSTEMI (non-ST elevated myocardial infarction) 11/14/2012  . Anemia, iron deficiency 11/14/2012    Patient is a 42 year old woman with PMH significant for rheumatoid arthritis not currently on any medications who present had earlier presented to the ED with complaints of chest pain. Left AMA and come back after 12 hours.  #NSTEMI - troponin = 1.65 on admission, peaked to 2.84 and then trended down. EKG unrevealing of ST elevation however T wave inversion and Q waves are present in lead III. Pt hemodynamically stable. Cardiology had been following her (Dr Shirlee Latch ) and they were planning for [facility] on Monday. She is currently chest pain free. She was has been started on heparin drip before leaving AMA.   Plan - admit to step down on telemetry - Monitor vitals -restart heparin drip - ASA 81mg  daily  - restart Metoprolol - Lipitor 80mg   QD - NTG PRN  - morphine PRN - repeat EKG in AM  - cardiology will be alerted about her presence in the AM. Initial plan was [facility] on Monday  Iron Deficiency Anemia - hemoglobin = initially 7.6 on admission then 11.6 after one 1 unit of PRBC in the setting of NSTEMI. MCV ~ 60. Anemia panel is classic for iron deficiency anemia, UIBC 452, ferritin of 1. She denies any melena or bloody BMs. Denies any history of sickle cell disease/trait or thalassemia. She states she's had one blood transfusion in the past for this issue when she was pregnant approximately 9 years ago. She has not been on any recent iron supplementation.   Plan  - Start iron supplementation after [facility] and when oral  - She will need further evaluation for the etiology of IDA   Rheumatoid arthritis - patient reports a history of rheumatoid arthritis. She states she was last seen by a rheumatologist approximately one year ago, at which time she was prescribed prednisone however she states she has not taken this medication or any other DMARD's for approximately one year. She denies any recent symptoms/flares associated with her RA.  - will need outpatient f/u with a rheumatologist  Dispo: Disposition is deferred at this time, awaiting improvement of current medical problems. Anticipated discharge in approximately 2-3 day(s).   The patient does not have a current PCP (No Pcp Per Patient) and does need an [facility] hospital follow-up appointment after discharge.  The patient does not have transportation limitations that hinder transportation to clinic appointments.  Signed:  Dow Adolph, MD PGY-1 Internal Medicine Teaching Service Pager: [phone] (7pm-7am) 11/16/2012, 5:20 AM

## 2012-11-16 NOTE — ED Provider Notes (Signed)
History     CSN: [id number]  Arrival date & time 11/15/12  2159   First MD Initiated Contact with Patient 11/16/12 0008      Chief Complaint  Patient presents with  . Re-admission     (Consider location/radiation/quality/duration/timing/severity/associated sxs/prior treatment) The history is limited by a developmental delay.   History per patient - HAd CP 3 days ago, presented here yesterday and was admitted for NSTEMI. PT left AMA to see her son's graduation, she returns tonight requesting to be admitted for scheduled cardiac cath. She has had some mild CP tonight PTA but none now. She took ASA this am. Yesterday pain was severe with radiation to left arm, sore in quality. No leg pain or swelling, no F/C.    Past Medical History  Diagnosis Date  . RA (rheumatoid arthritis)     Past Surgical History  Procedure Laterality Date  . Cesarean section      History reviewed. No pertinent family history.  History  Substance Use Topics  . Smoking status: Never Smoker   . Smokeless tobacco: Not on file  . Alcohol Use: No    OB History   Grav Para Term Preterm Abortions TAB SAB Ect Mult Living                  Review of Systems  Constitutional: Negative for fever and chills.  HENT: Negative for neck pain and neck stiffness.   Eyes: Negative for pain.  Respiratory: Negative for shortness of breath.   Cardiovascular: Positive for chest pain.  Gastrointestinal: Negative for abdominal pain.  Genitourinary: Negative for dysuria.  Musculoskeletal: Negative for back pain.  Skin: Negative for rash.  Neurological: Negative for headaches.  All other systems reviewed and are negative.    Allergies  Review of patient's allergies indicates no known allergies.  Home Medications  No current outpatient prescriptions on file.  BP 123/85  Pulse 100  Temp(Src) 98.3 F (36.8 C) (Oral)  Resp 16  SpO2 100%  LMP 11/03/2012  Physical Exam  Constitutional: She is oriented to  person, place, and time. She appears well-developed and well-nourished.  HENT:  Head: Normocephalic and atraumatic.  Eyes: EOM are normal. Pupils are equal, round, and reactive to light.  Neck: Neck supple.  Cardiovascular: Normal rate, regular rhythm and intact distal pulses.   Pulmonary/Chest: Effort normal and breath sounds normal. No respiratory distress.  Abdominal: Soft. Bowel sounds are normal. She exhibits no distension. There is no tenderness.  Musculoskeletal: Normal range of motion. She exhibits no edema.  Neurological: She is alert and oriented to person, place, and time.  Skin: Skin is warm and dry.    ED Course  Procedures (including critical care time)  Labs Reviewed  CBC  COMPREHENSIVE METABOLIC PANEL  TROPONIN I   Dg Chest 2 View  11/14/2012   *RADIOLOGY REPORT*  Clinical Data: Chest pain  CHEST - 2 VIEW  Comparison: None.  Findings: The lungs are well-aerated and free from pulmonary edema, focal airspace consolidation or pulmonary nodule.  Cardiac and mediastinal contours are within normal limits.  No pneumothorax, or pleural effusion. No acute osseous findings.  IMPRESSION:  No acute cardiopulmonary disease.   Original Report Authenticated By: Malachy Moan, M.D.     Date: 11/16/2012  Rate: 87  Rhythm: normal sinus rhythm  QRS Axis: normal  Intervals: normal  ST/T Wave abnormalities: nonspecific ST changes  Conduction Disutrbances:none  Narrative Interpretation:   Old EKG Reviewed: none available  ASA PTA  12:47 AM d/w [facility] resident on call, expecting PT and will admit for plan cath in am  MDM  CP/ NSTEMI left AMA now returns for scheduled cath, pain free in the ED  ECG, labs, CXR  Admit        Sunnie Nielsen, MD 11/16/12 2327

## 2012-11-16 NOTE — Progress Notes (Signed)
Subjective: Pt returns for cardiac cath after leaving AMA to attend her son's graduation. She denies any chest pain and has no complaints today. She has been seen by Cardiology who plan for a heart cath today.   Objective: Vital signs in last 24 hours: Filed Vitals:   11/16/12 0100 11/16/12 0115 11/16/12 0130 11/16/12 0324  BP: 115/67 105/51 119/74 130/84  Pulse: 90 88 86 88  Temp:    97.8 F (36.6 C)  TempSrc:    Oral  Resp: 19 16 23 18   Height:    5\' 2"  (1.575 m)  Weight:    246 lb 7.6 oz (111.8 kg)  SpO2: 98% 98% 98% 100%   Weight change:   Intake/Output Summary (Last 24 hours) at 11/16/12 1015 Last data filed at 11/16/12 0833  Gross per 24 hour  Intake      0 ml  Output      0 ml  Net      0 ml   Vitals reviewed. General: Resting in bed, NAD HEENT: PERRL, EOMI, no scleral icterus Cardiac: RRR, no rubs, murmurs or gallops Pulm: Clear to auscultation bilaterally, no wheezes, rales, or rhonchi Abd: Soft, nontender, nondistended, BS present Ext: Warm and well perfused, no pedal edema, 2+ radial pulses Neuro: Alert and oriented X3, cranial nerves II-XII grossly intact, strength and sensation to light touch equal in bilateral upper and lower extremities  Lab Results: Basic Metabolic Panel:  Recent Labs Lab 11/16/12 0009 11/16/12 0046 11/16/12 0737  NA 136 141 137  K 3.7 3.9 4.0  CL 99 105 103  CO2 24  --  24  GLUCOSE 105* 104* 97  BUN 13 13 13   CREATININE 0.76 0.90 0.68  CALCIUM 9.6  --  9.3   Liver Function Tests:  Recent Labs Lab 11/14/12 0806 11/16/12 0009  AST 16 17  ALT 9 10  ALKPHOS 66 77  BILITOT 0.2* 0.2*  PROT 7.5 8.2  ALBUMIN 3.0* 3.4*   CBC:  Recent Labs Lab 11/14/12 0806  11/16/12 0009 11/16/12 0046 11/16/12 0737  WBC 9.9  < > 13.0*  --  12.1*  NEUTROABS 5.6  --   --   --   --   HGB 7.6*  < > 9.1* 11.6* 8.5*  HCT 27.3*  < > 32.0* 34.0* 29.4*  MCV 59.5*  < > 61.3*  --  61.3*  PLT 689*  < > 547*  --  665*  < > = values in this  interval not displayed. Cardiac Enzymes:  Recent Labs Lab 11/14/12 2344 11/15/12 0540 11/16/12 0009  TROPONINI 2.84* 1.58* 1.51*   BNP:  Recent Labs Lab 11/14/12 1302 11/14/12 1430  PROBNP 108.1 111.4   Hemoglobin A1C:  Recent Labs Lab 11/14/12 1135  HGBA1C 5.7*   Fasting Lipid Panel:  Recent Labs Lab 11/14/12 2344  CHOL 211*  HDL 44  LDLCALC 143*  TRIG 118  CHOLHDL 4.8   Thyroid Function Tests:  Recent Labs Lab 11/14/12 1113  TSH 1.181   Coagulation:  Recent Labs Lab 11/14/12 1430 11/16/12 0737  LABPROT 14.4 13.7  INR 1.14 1.06   Anemia Panel:  Recent Labs Lab 11/14/12 1113  VITAMINB12 599  FOLATE 19.0  FERRITIN 1*  TIBC Not calculated due to Iron <10.  IRON <10*  RETICCTPCT 1.9   Urine Drug Screen: Drugs of Abuse     Component Value Date/Time   LABOPIA NONE DETECTED 11/14/2012 1142   COCAINSCRNUR NONE DETECTED 11/14/2012 1142   LABBENZ  NONE DETECTED 11/14/2012 1142   AMPHETMU NONE DETECTED 11/14/2012 1142   THCU NONE DETECTED 11/14/2012 1142   LABBARB NONE DETECTED 11/14/2012 1142     Micro Results: Recent Results (from the past 240 hour(s))  MRSA PCR SCREENING     Status: None   Collection Time    11/14/12  1:33 PM      Result Value Range Status   MRSA by PCR NEGATIVE  NEGATIVE Final   Comment:            The GeneXpert MRSA Assay (FDA     approved for NASAL specimens     only), is one component of a     comprehensive MRSA colonization     surveillance program. It is not     intended to diagnose MRSA     infection nor to guide or     monitor treatment for     MRSA infections.   Studies/Results: Dg Chest Portable 1 View  11/16/2012   *RADIOLOGY REPORT*  Clinical Data: Mid chest pain  PORTABLE CHEST - 1 VIEW  Comparison: 11/14/2012  Findings: Cardiac leads project over the chest.  Heart size is normal and stable.  Lung volumes are low.  There is peribronchial thickening bilaterally.  No focal consolidation, edema, or pleural  effusion.  IMPRESSION: Mild peribronchial thickening bilaterally.  This can be seen in the setting of acute or chronic bronchitis, smoking, or asthma.  It may be accentuated by the patient's low lung volumes.   Original Report Authenticated By: Britta Mccreedy, M.D.   Medications: I have reviewed the patient's current medications. Scheduled Meds: . aspirin  324 mg Oral Pre-Cath  . aspirin EC  81 mg Oral Daily  . atorvastatin  80 mg Oral q1800  . ferrous sulfate  325 mg Oral BID WC  . metoprolol tartrate  12.5 mg Oral BID  . sodium chloride  3 mL Intravenous Q12H   Continuous Infusions: . sodium chloride 1 mL/kg/hr (11/16/12 0812)  . heparin 1,500 Units/hr (11/16/12 0137)   PRN Meds:.acetaminophen, acetaminophen, morphine injection  Assessment/Plan: Pt is a 42 year old woman with PMH significant for rheumatoid arthritis not currently on any medications who re-presents for cardiac cath 2/2 NSTEMI after leaving AMA for her son's graduation.  NSTEMI: Troponin 1.65 on inital hospital admission, peaking to 2.84 this admission and now down to 1.51. EKG w/o ST elevation however T wave inversion and Q waves are present in lead III this admission. Pt remains hemodynamically stable. [facility] has been following her and are planning for LHC today. ASA, BB, Lipitor, and heparin drip were all restarted this admission. She remains chest pain free.  Plan   - LHC with Cards today - Heparin drip  - ASA 81mg  daily  - Metoprolol  - Lipitor 80mg  QD  - NTG PRN  - Morphine PRN  - F/u with Cardiology   Iron Deficiency Anemia: Hemoglobin 7.6 on initial admission and corrected to 9.2 after one 1 unit of PRBC in the setting of NSTEMI. MCV ~ 60. Anemia panel is classic for iron deficiency anemia: UIBC 452, ferritin of 1. She denies any melena or bloody stools. Denies any history of sickle cell disease/trait or thalassemia. She states she's had one blood transfusion in the past for this issue when she was  pregnant approximately 9 years ago. She has not been on any recent iron supplementation.  Plan  - Start daily iron supplementation after [facility] - She will need outpatient w/o for the etiology  of her anemia   Rheumatoid arthritis: Patient reports a history of rheumatoid arthritis. She states she was last seen by a rheumatologist approximately one year ago, at which time she was prescribed prednisone however she states she has not taken this medication or any other DMARD's for approximately one year. She denies any recent symptoms/flares associated with her RA.  - She will need outpatient f/u with Rheumatology  DVT PPx: On heparin drip   Dispo: Disposition is deferred at this time, awaiting improvement of current medical problems.  Anticipated discharge in approximately 1-3 day(s).   The patient does not have a current PCP (No PCP Per Patient), therefore will possibly be requiring OPC follow-up after discharge.   The patient does not have transportation limitations that hinder transportation to clinic appointments.  .Services Needed at time of discharge: Y = Yes, Blank = No PT:   OT:   RN:   Equipment:   Other:     LOS: 1 day   Genelle Gather 11/16/2012, 10:15 AM

## 2012-11-16 NOTE — H&P (View-Only) (Signed)
 Patient Name: Abigail Garcia Date of Encounter: 11/16/2012   Principal Problem:   NSTEMI (non-ST elevated myocardial infarction) Active Problems:   Rheumatoid arthritis   Anemia, iron deficiency   SUBJECTIVE  Pt returned last night for readmission and cath.  No chest pain or sob.  For cath today.  CURRENT MEDS . aspirin EC  81 mg Oral Daily  . atorvastatin  80 mg Oral q1800  . metoprolol tartrate  12.5 mg Oral BID  . sodium chloride  3 mL Intravenous Q12H   OBJECTIVE  Filed Vitals:   11/16/12 0100 11/16/12 0115 11/16/12 0130 11/16/12 0324  BP: 115/67 105/51 119/74 130/84  Pulse: 90 88 86 88  Temp:    97.8 F (36.6 C)  TempSrc:    Oral  Resp: 19 16 23 18  Height:    5' 2" (1.575 m)  Weight:    246 lb 7.6 oz (111.8 kg)  SpO2: 98% 98% 98% 100%   No intake or output data in the 24 hours ending 11/16/12 0750 Filed Weights   11/15/12 2300 11/16/12 0324  Weight: 251 lb 5.2 oz (114 kg) 246 lb 7.6 oz (111.8 kg)    PHYSICAL EXAM  General: Pleasant, NAD. Neuro: Alert and oriented X 3. Moves all extremities spontaneously. Psych: Normal affect. HEENT:  Normal  Neck: Supple without bruits or JVD. Lungs:  Resp regular and unlabored, CTA. Heart: RRR no s3, s4, or murmurs. Abdomen: Soft, non-tender, non-distended, BS + x 4.  Extremities: No clubbing, cyanosis or edema. DP/PT/Radials 2+ and equal bilaterally.  Nl Allen's.  Accessory Clinical Findings  CBC  Recent Labs  11/14/12 0806  11/14/12 2344 11/16/12 0009 11/16/12 0046  WBC 9.9  < > 11.8* 13.0*  --   NEUTROABS 5.6  --   --   --   --   HGB 7.6*  < > 9.0* 9.1* 11.6*  HCT 27.3*  < > 30.7* 32.0* 34.0*  MCV 59.5*  < > 60.7* 61.3*  --   PLT 689*  < > 730* 547*  --   < > = values in this interval not displayed. Basic Metabolic Panel  Recent Labs  11/14/12 2344 11/16/12 0009 11/16/12 0046  NA 134* 136 141  K 3.8 3.7 3.9  CL 100 99 105  CO2 24 24  --   GLUCOSE 105* 105* 104*  BUN 9 13 13  CREATININE 0.79  0.76 0.90  CALCIUM 9.4 9.6  --    Liver Function Tests  Recent Labs  11/14/12 0806 11/16/12 0009  AST 16 17  ALT 9 10  ALKPHOS 66 77  BILITOT 0.2* 0.2*  PROT 7.5 8.2  ALBUMIN 3.0* 3.4*   Cardiac Enzymes  Recent Labs  11/14/12 2344 11/15/12 0540 11/16/12 0009  TROPONINI 2.84* 1.58* 1.51*   Hemoglobin A1C  Recent Labs  11/14/12 1135  HGBA1C 5.7*   Fasting Lipid Panel  Recent Labs  11/14/12 2344  CHOL 211*  HDL 44  LDLCALC 143*  TRIG 118  CHOLHDL 4.8   Thyroid Function Tests  Recent Labs  11/14/12 1113  TSH 1.181   TELE  rsr  Radiology/Studies  Dg Chest Portable 1 View  11/16/2012   *RADIOLOGY REPORT*  Clinical Data: Mid chest pain  PORTABLE CHEST - 1 VIEW  Comparison: 11/14/2012  Findings: Cardiac leads project over the chest.  Heart size is normal and stable.  Lung volumes are low.  There is peribronchial thickening bilaterally.  No focal consolidation, edema, or pleural effusion.    IMPRESSION: Mild peribronchial thickening bilaterally.  This can be seen in the setting of acute or chronic bronchitis, smoking, or asthma.  It may be accentuated by the patient's low lung volumes.   Original Report Authenticated By: Susan Turner, M.D.   ASSESSMENT AND PLAN  1.  NSTEMI:  Pt presented late last week with chest pain/soreness and was found to have an elevated troponin.  This has been trending down. NL EF by echo on Saturday.  She left yesterday to attend her son's graduation and returned last night.  She has had no further chest pain.  Put on schedule for cath today. Hydrate.  Cont heparin, asa, statin, bb.  2.  Anemia:  Stable s/p transfusion.  No bleeding.  3.  RA:  No current joint complaints.  4.  HL:  LDL 143.  Cont high potency statin.  Signed, Christopher Berge NP  Patient seen with NP, agree with the above note.  1. NSTEMI: No further chest pain.  EF normal by echo.  For LHC today.  If no significant coronary disease, would consider  myopericarditis related to RA.  ESR was elevated at 49.  I do not see where she has had a urine pregnancy screen so will order.  2. Anemia: Stable hemoglobin after transfusion, normal rise.  No history of GI bleeding/BRBPR/melena.  Has had anemia "since I was 12."  It sounds like she has heavy menstrual periods.  She has Fe-deficiency anemia.    Kambrey Hagger 11/16/2012 10:33 AM  

## 2012-11-16 NOTE — H&P (Signed)
Date: 11/16/2012  Patient name: Abigail Garcia  Medical record number: [id number]  Date of birth: 06/24/1970   I have seen and evaluated Abigail Garcia and discussed their care with the Residency Team.  41 yr. Old AAF w/ hx RA recently admitted for NSTEMI and left AMA before workup, including cath, could be completed.  She denies any current CP, SOB at this time. She had an episode of chest pressure that resolved over a day or so, repeated the next day worsened in severity. She admits to radiation of the pain to her left arm.  She was noted to have +CE's. She was noted to have significant iron deficiency anemia, w/ a Hgb of 7.6 on admission and was tranfused due to cardiac ischemia concerns.  Although an ESR was elevated at 49 due to concern for inflammatory myopathy/peridcarditis as a cause for her pain related to her RA, please note that ESR can be falsely elevated in the setting of anemia, especially her degree of anemia.  She does not endorse any other symptoms of RA at this time. She is currently being treated medically for plans for LHC today.  Physical Exam: Blood pressure 122/78, pulse 73, temperature 97.8 F (36.6 C), temperature source Oral, resp. rate 18, height 5\' 2"  (1.575 m), weight 246 lb 7.6 oz (111.8 kg), last menstrual period 11/03/2012, SpO2 100.00%. BP 122/78  Pulse 73  Temp(Src) 97.8 F (36.6 C) (Oral)  Resp 18  Ht 5\' 2"  (1.575 m)  Wt 246 lb 7.6 oz (111.8 kg)  BMI 45.07 kg/m2  SpO2 100%  LMP 11/03/2012 General appearance: alert, cooperative and appears stated age Head: Normocephalic, without obvious abnormality, atraumatic Eyes: conjunctivae/corneas clear. PERRL, EOM's intact. Fundi benign. Neck: no adenopathy, no carotid bruit, no JVD, supple, symmetrical, trachea midline and thyroid not enlarged, symmetric, no tenderness/mass/nodules Lungs: clear to auscultation bilaterally Heart: regular rate and rhythm, S1, S2 normal, no murmur, click, rub or gallop Abdomen: soft,  non-tender; bowel sounds normal; no masses,  no organomegaly Extremities: extremities normal, atraumatic, no cyanosis or edema Pulses: 2+ and symmetric Neurologic: Alert and oriented X 3, normal strength and tone. Normal symmetric reflexes. Normal coordination and gait  Lab results: Results for orders placed during the hospital encounter of 11/15/12 (from the past 24 hour(s))  CBC     Status: Abnormal   Collection Time    11/16/12 12:09 AM      Result Value Range   WBC 13.0 (*) 4.0 - 10.5 K/uL   RBC 5.22 (*) 3.87 - 5.11 MIL/uL   Hemoglobin 9.1 (*) 12.0 - 15.0 g/dL   HCT 40.9 (*) 81.1 - 91.4 %   MCV 61.3 (*) 78.0 - 100.0 fL   MCH 17.4 (*) 26.0 - 34.0 pg   MCHC 28.4 (*) 30.0 - 36.0 g/dL   RDW 78.2 (*) 95.6 - 21.3 %   Platelets 547 (*) 150 - 400 K/uL  COMPREHENSIVE METABOLIC PANEL     Status: Abnormal   Collection Time    11/16/12 12:09 AM      Result Value Range   Sodium 136  135 - 145 mEq/L   Potassium 3.7  3.5 - 5.1 mEq/L   Chloride 99  96 - 112 mEq/L   CO2 24  19 - 32 mEq/L   Glucose, Bld 105 (*) 70 - 99 mg/dL   BUN 13  6 - 23 mg/dL   Creatinine, Ser 0.86  0.50 - 1.10 mg/dL   Calcium 9.6  8.4 - 57.8 mg/dL  Total Protein 8.2  6.0 - 8.3 g/dL   Albumin 3.4 (*) 3.5 - 5.2 g/dL   AST 17  0 - 37 U/L   ALT 10  0 - 35 U/L   Alkaline Phosphatase 77  39 - 117 U/L   Total Bilirubin 0.2 (*) 0.3 - 1.2 mg/dL   GFR calc non Af Amer >90  >90 mL/min   GFR calc Af Amer >90  >90 mL/min  TROPONIN I     Status: Abnormal   Collection Time    11/16/12 12:09 AM      Result Value Range   Troponin I 1.51 (*) <0.30 ng/mL  POCT I-STAT, CHEM 8     Status: Abnormal   Collection Time    11/16/12 12:46 AM      Result Value Range   Sodium 141  135 - 145 mEq/L   Potassium 3.9  3.5 - 5.1 mEq/L   Chloride 105  96 - 112 mEq/L   BUN 13  6 - 23 mg/dL   Creatinine, Ser 8.75  0.50 - 1.10 mg/dL   Glucose, Bld 643 (*) 70 - 99 mg/dL   Calcium, Ion 3.29 (*) 1.12 - 1.23 mmol/L   TCO2 27  0 - 100 mmol/L    Hemoglobin 11.6 (*) 12.0 - 15.0 g/dL   HCT 51.8 (*) 84.1 - 66.0 %  HEPARIN LEVEL (UNFRACTIONATED)     Status: None   Collection Time    11/16/12  7:37 AM      Result Value Range   Heparin Unfractionated 0.64  0.30 - 0.70 IU/mL  CBC     Status: Abnormal   Collection Time    11/16/12  7:37 AM      Result Value Range   WBC 12.1 (*) 4.0 - 10.5 K/uL   RBC 4.80  3.87 - 5.11 MIL/uL   Hemoglobin 8.5 (*) 12.0 - 15.0 g/dL   HCT 63.0 (*) 16.0 - 10.9 %   MCV 61.3 (*) 78.0 - 100.0 fL   MCH 17.7 (*) 26.0 - 34.0 pg   MCHC 28.9 (*) 30.0 - 36.0 g/dL   RDW 32.3 (*) 55.7 - 32.2 %   Platelets 665 (*) 150 - 400 K/uL  BASIC METABOLIC PANEL     Status: None   Collection Time    11/16/12  7:37 AM      Result Value Range   Sodium 137  135 - 145 mEq/L   Potassium 4.0  3.5 - 5.1 mEq/L   Chloride 103  96 - 112 mEq/L   CO2 24  19 - 32 mEq/L   Glucose, Bld 97  70 - 99 mg/dL   BUN 13  6 - 23 mg/dL   Creatinine, Ser 0.25  0.50 - 1.10 mg/dL   Calcium 9.3  8.4 - 42.7 mg/dL   GFR calc non Af Amer >90  >90 mL/min   GFR calc Af Amer >90  >90 mL/min  PROTIME-INR     Status: None   Collection Time    11/16/12  7:37 AM      Result Value Range   Prothrombin Time 13.7  11.6 - 15.2 seconds   INR 1.06  0.00 - 1.49  APTT     Status: Abnormal   Collection Time    11/16/12  7:37 AM      Result Value Range   aPTT 79 (*) 24 - 37 seconds  PREGNANCY, URINE     Status: None   Collection Time  11/16/12 10:54 AM      Result Value Range   Preg Test, Ur NEGATIVE  NEGATIVE    Imaging results:  Dg Chest Portable 1 View  11/16/2012   *RADIOLOGY REPORT*  Clinical Data: Mid chest pain  PORTABLE CHEST - 1 VIEW  Comparison: 11/14/2012  Findings: Cardiac leads project over the chest.  Heart size is normal and stable.  Lung volumes are low.  There is peribronchial thickening bilaterally.  No focal consolidation, edema, or pleural effusion.  IMPRESSION: Mild peribronchial thickening bilaterally.  This can be seen in the  setting of acute or chronic bronchitis, smoking, or asthma.  It may be accentuated by the patient's low lung volumes.   Original Report Authenticated By: Britta Mccreedy, M.D.    Assessment and Plan: I have seen and evaluated the patient as outlined above. I agree with the formulated Assessment and Plan as detailed in the residents' admission note, with the following changes:  -NSTEMI: LHC today. Cardiology following. On ASA, atorvastatin, metoprolol, heparin gtt. -Iron deficiency anemia: I suspect her elevated ESR is secondary to her significant anemia, I don't suspect RA as a cause at this time. She needs an evaluation as an outpatient for her anemia, including colonoscopy. Suspect contribution of heavy menses. -rest per resident note.  Jonah Blue, DO 6/16/201411:57 AM

## 2012-11-16 NOTE — ED Notes (Signed)
MD at bedside. 

## 2012-11-16 NOTE — Interval H&P Note (Signed)
History and Physical Interval Note:  11/16/2012 1:42 PM  Abigail Garcia  has presented today for surgery, with the diagnosis of cp  The various methods of treatment have been discussed with the patient and family. After consideration of risks, benefits and other options for treatment, the patient has consented to  Procedure(s): LEFT HEART CATHETERIZATION WITH CORONARY ANGIOGRAM (N/A) as a surgical intervention .  The patient's history has been reviewed, patient examined, no change in status, stable for surgery.  I have reviewed the patient's chart and labs.  Questions were answered to the patient's satisfaction.     Theron Arista [facility] 11/16/2012 1:42 PM

## 2012-11-16 NOTE — Progress Notes (Signed)
Patient Name: Abigail Garcia Date of Encounter: 11/16/2012   Principal Problem:   NSTEMI (non-ST elevated myocardial infarction) Active Problems:   Rheumatoid arthritis   Anemia, iron deficiency   SUBJECTIVE  Pt returned last night for readmission and cath.  No chest pain or sob.  For cath today.  CURRENT MEDS . aspirin EC  81 mg Oral Daily  . atorvastatin  80 mg Oral q1800  . metoprolol tartrate  12.5 mg Oral BID  . sodium chloride  3 mL Intravenous Q12H   OBJECTIVE  Filed Vitals:   11/16/12 0100 11/16/12 0115 11/16/12 0130 11/16/12 0324  BP: 115/67 105/51 119/74 130/84  Pulse: 90 88 86 88  Temp:    97.8 F (36.6 C)  TempSrc:    Oral  Resp: 19 16 23 18   Height:    5\' 2"  (1.575 m)  Weight:    246 lb 7.6 oz (111.8 kg)  SpO2: 98% 98% 98% 100%   No intake or output data in the 24 hours ending 11/16/12 0750 Filed Weights   11/15/12 2300 11/16/12 0324  Weight: 251 lb 5.2 oz (114 kg) 246 lb 7.6 oz (111.8 kg)    PHYSICAL EXAM  General: Pleasant, NAD. Neuro: Alert and oriented X 3. Moves all extremities spontaneously. Psych: Normal affect. HEENT:  Normal  Neck: Supple without bruits or JVD. Lungs:  Resp regular and unlabored, CTA. Heart: RRR no s3, s4, or murmurs. Abdomen: Soft, non-tender, non-distended, BS + x 4.  Extremities: No clubbing, cyanosis or edema. DP/PT/Radials 2+ and equal bilaterally.  Nl Allen's.  Accessory Clinical Findings  CBC  Recent Labs  11/14/12 0806  11/14/12 2344 11/16/12 0009 11/16/12 0046  WBC 9.9  < > 11.8* 13.0*  --   NEUTROABS 5.6  --   --   --   --   HGB 7.6*  < > 9.0* 9.1* 11.6*  HCT 27.3*  < > 30.7* 32.0* 34.0*  MCV 59.5*  < > 60.7* 61.3*  --   PLT 689*  < > 730* 547*  --   < > = values in this interval not displayed. Basic Metabolic Panel  Recent Labs  11/14/12 2344 11/16/12 0009 11/16/12 0046  NA 134* 136 141  K 3.8 3.7 3.9  CL 100 99 105  CO2 24 24  --   GLUCOSE 105* 105* 104*  BUN 9 13 13   CREATININE 0.79  0.76 0.90  CALCIUM 9.4 9.6  --    Liver Function Tests  Recent Labs  11/14/12 0806 11/16/12 0009  AST 16 17  ALT 9 10  ALKPHOS 66 77  BILITOT 0.2* 0.2*  PROT 7.5 8.2  ALBUMIN 3.0* 3.4*   Cardiac Enzymes  Recent Labs  11/14/12 2344 11/15/12 0540 11/16/12 0009  TROPONINI 2.84* 1.58* 1.51*   Hemoglobin A1C  Recent Labs  11/14/12 1135  HGBA1C 5.7*   Fasting Lipid Panel  Recent Labs  11/14/12 2344  CHOL 211*  HDL 44  LDLCALC 143*  TRIG 118  CHOLHDL 4.8   Thyroid Function Tests  Recent Labs  11/14/12 1113  TSH 1.181   TELE  rsr  Radiology/Studies  Dg Chest Portable 1 View  11/16/2012   *RADIOLOGY REPORT*  Clinical Data: Mid chest pain  PORTABLE CHEST - 1 VIEW  Comparison: 11/14/2012  Findings: Cardiac leads project over the chest.  Heart size is normal and stable.  Lung volumes are low.  There is peribronchial thickening bilaterally.  No focal consolidation, edema, or pleural effusion.  IMPRESSION: Mild peribronchial thickening bilaterally.  This can be seen in the setting of acute or chronic bronchitis, smoking, or asthma.  It may be accentuated by the patient's low lung volumes.   Original Report Authenticated By: Britta Mccreedy, M.D.   ASSESSMENT AND PLAN  1.  NSTEMI:  Pt presented late last week with chest pain/soreness and was found to have an elevated troponin.  This has been trending down. NL EF by echo on Saturday.  She left yesterday to attend her son's graduation and returned last night.  She has had no further chest pain.  Put on schedule for cath today. Hydrate.  Cont heparin, asa, statin, bb.  2.  Anemia:  Stable s/p transfusion.  No bleeding.  3.  RA:  No current joint complaints.  4.  HL:  LDL 143.  Cont high potency statin.  Signed, Nicolasa Ducking NP  Patient seen with NP, agree with the above note.  1. NSTEMI: No further chest pain.  EF normal by echo.  For LHC today.  If no significant coronary disease, would consider  myopericarditis related to RA.  ESR was elevated at 49.  I do not see where she has had a urine pregnancy screen so will order.  2. Anemia: Stable hemoglobin after transfusion, normal rise.  No history of GI bleeding/BRBPR/melena.  Has had anemia "since I was 12."  It sounds like she has heavy menstrual periods.  She has Fe-deficiency anemia.    [name] 11/16/2012 10:33 AM

## 2012-11-16 NOTE — Progress Notes (Signed)
ANTICOAGULATION CONSULT NOTE - Initial Consult  Pharmacy Consult for Heparin  Indication: chest pain/ACS  No Known Allergies  Patient Measurements: Height: 5' 2.99" (160 cm) Weight: 251 lb 5.2 oz (114 kg) IBW/kg (Calculated) : 52.38 Heparin Dosing Weight: 80 kg   Vital Signs: Temp: 98.3 F (36.8 C) (06/15 2206) Temp src: Oral (06/15 2206) BP: 123/85 mmHg (06/15 2206) Pulse Rate: 100 (06/15 2206)  Labs:  Recent Labs  11/14/12 0806  11/14/12 1430 11/14/12 2000 11/14/12 2017 11/14/12 2344 11/15/12 0540 11/16/12 0046  HGB 7.6*  --   --  9.2*  --  9.0*  --  11.6*  HCT 27.3*  --   --  32.7*  --  30.7*  --  34.0*  PLT 689*  --   --  673*  --  730*  --   --   APTT  --   --  84*  --   --   --   --   --   LABPROT  --   --  14.4  --   --   --   --   --   INR  --   --  1.14  --   --   --   --   --   HEPARINUNFRC  --   --   --   --  0.15* 0.40 0.18*  --   CREATININE 0.66  --   --   --   --  0.79  --  0.90  TROPONINI  --   < >  --   --  2.33* 2.84* 1.58*  --   < > = values in this interval not displayed.  Estimated Creatinine Clearance: 100 ml/min (by C-G formula based on Cr of 0.9).   Medical History: Past Medical History  Diagnosis Date  . RA (rheumatoid arthritis)     Medications:  None  Assessment: 42 yo female with NSTEMI for heparin.  Patient admitted 6/14, but left AMA 6/15 to attend son's high school graduation.  Previously on heparin 1500 units/hr.  Received Lovenox 110 mg at 0945 6/15 prior to leaving.     Goal of Therapy:  Heparin level 0.3-0.7 units/ml Monitor platelets by anticoagulation protocol: Yes   Plan:  Heparin 2000 units IV bolus, then 1500 units/hr Check heparin level in 8 hours.  Lada Fulbright, Gary Fleet 11/16/2012,12:55 AM

## 2012-11-16 NOTE — CV Procedure (Addendum)
   Cardiac Catheterization Procedure Note  Name: Abigail Garcia MRN: [id number] DOB: June 22, 1970  Procedure: Left Heart Cath, Selective Coronary Angiography, LV angiography  Indication: 42 yo with history of RA and iron deficiency anemia presents with a NSTEMI.   Procedural Details: The right wrist was prepped, draped, and anesthetized with 1% lidocaine. Using the modified Seldinger technique, a 5 French sheath was introduced into the right radial artery. 3 mg of verapamil was administered through the sheath, weight-based unfractionated heparin was administered intravenously. Standard Judkins catheters were used for selective coronary angiography and left ventriculography. Catheter exchanges were performed over an exchange length guidewire. There were no immediate procedural complications. A TR band was used for radial hemostasis at the completion of the procedure.  The patient was transferred to the post catheterization recovery area for further monitoring.  Procedural Findings: Hemodynamics: AO 114/77 mean of 96 mm Hg LV 118/6 mm Hg  Coronary angiography: Coronary dominance: right  Left mainstem: Normal  Left anterior descending (LAD): There is a thrombus in the mid LAD following the takeoff of the first diagonal with eccentric 80-90% stenosis. The LAD is a large vessel and otherwise appears normal.   There is a very large Ramus intermediate branch that appears normal.   Left circumflex (LCx): Relatively small and normal.  Right coronary artery (RCA): Anterior takeoff, dominant vessel. Normal.  Left ventriculography: Left ventricular systolic function is normal, LVEF is estimated at 55-65%, there is no significant mitral regurgitation   Final Conclusions:   1. 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  2. Normal LV function.  Recommendations: Recommend aggressive antithrombotic and antiplatelet therapy for 48-72 hours. Check for coagulopathy.  Recommend repeat angiography on Thursday 11/19/12  Theron Arista [facility] 11/16/2012, 2:18 PM

## 2012-11-16 NOTE — Progress Notes (Signed)
ANTICOAGULATION CONSULT NOTE - Initial Consult  Pharmacy Consult for Heparin  Indication: chest pain/ACS  No Known Allergies  Patient Measurements: Height: 5\' 2"  (157.5 cm) Weight: 246 lb 7.6 oz (111.8 kg) IBW/kg (Calculated) : 50.1 Heparin Dosing Weight: 80 kg   Vital Signs: Temp: 97.8 F (36.6 C) (06/16 0324) Temp src: Oral (06/16 0324) BP: 119/77 mmHg (06/16 1317) Pulse Rate: 71 (06/16 1317)  Labs:  Recent Labs  11/14/12 0806  11/14/12 1430  11/14/12 2344 11/15/12 0540 11/16/12 0009 11/16/12 0046 11/16/12 0737  HGB 7.6*  --   --   < > 9.0*  --  9.1* 11.6* 8.5*  HCT 27.3*  --   --   < > 30.7*  --  32.0* 34.0* 29.4*  PLT 689*  --   --   < > 730*  --  547*  --  665*  APTT  --   --  84*  --   --   --   --   --  79*  LABPROT  --   --  14.4  --   --   --   --   --  13.7  INR  --   --  1.14  --   --   --   --   --  1.06  HEPARINUNFRC  --   --   --   < > 0.40 0.18*  --   --  0.64  CREATININE 0.66  --   --   --  0.79  --  0.76 0.90 0.68  TROPONINI  --   < >  --   < > 2.84* 1.58* 1.51*  --   --   < > = values in this interval not displayed.  Estimated Creatinine Clearance: 109.3 ml/min (by C-G formula based on Cr of 0.68).   Medical History: Past Medical History  Diagnosis Date  . RA (rheumatoid arthritis)     Medications:  None  Assessment: 42 yo female with NSTEMI for heparin.  Patient admitted 6/14, but left AMA 6/15 to attend son's high school graduation. She received Lovenox 110 mg at 0945 6/15 prior to leaving. Heparin was restarted last night at 1500 units/hr, heparin level therapeutic this morning. hgb 8.5, plt 665, no bleeding noted   Goal of Therapy:  Heparin level 0.3-0.7 units/ml Monitor platelets by anticoagulation protocol: Yes   Plan:  - No change for heparin rate - f/u plans after cath  Bayard Hugger, PharmD, BCPS  Clinical Pharmacist  Pager: [phone]   11/16/2012,2:54 PM  Addendum:  S/p cath, per MD note, pt has single vessel obstructive  CAD with predominantly thrombotic lesion in the mid LAD. Pharmacy is consulted to start Integrilin for 72 hrs, and also resume heparin 8 hrs post cath (band off @ ~ 1600 per RN). Likely for PCI on 6/19  Goal of Therapy:  Heparin level 0.3-0.5 units/ml while on Integrilin  Monitor platelets by anticoagulation protocol: Yes  Plan:  - Integrilin 59mcg/kg/min = 13.4mg /hr = 17.9 ml/hr - Restart IV heparin 1000 units/hr at midnight - f/u AM heparin level and CBC

## 2012-11-17 DIAGNOSIS — M069 Rheumatoid arthritis, unspecified: Secondary | ICD-10-CM

## 2012-11-17 DIAGNOSIS — I214 Non-ST elevation (NSTEMI) myocardial infarction: Secondary | ICD-10-CM

## 2012-11-17 DIAGNOSIS — D509 Iron deficiency anemia, unspecified: Secondary | ICD-10-CM

## 2012-11-17 LAB — LUPUS ANTICOAGULANT PANEL
Lupus Anticoagulant: NOT DETECTED
PTTLA 4:1 Mix: 60.7 secs — ABNORMAL HIGH (ref 28.0–43.0)

## 2012-11-17 LAB — CBC
HCT: 30.9 % — ABNORMAL LOW (ref 36.0–46.0)
Hemoglobin: 8.8 g/dL — ABNORMAL LOW (ref 12.0–15.0)
MCHC: 28.5 g/dL — ABNORMAL LOW (ref 30.0–36.0)
RBC: 5.07 MIL/uL (ref 3.87–5.11)

## 2012-11-17 LAB — HEPARIN LEVEL (UNFRACTIONATED): Heparin Unfractionated: 0.47 IU/mL (ref 0.30–0.70)

## 2012-11-17 LAB — CARDIOLIPIN ANTIBODIES, IGG, IGM, IGA
Anticardiolipin IgA: 4 APL U/mL — ABNORMAL LOW (ref <22)
Anticardiolipin IgM: 1 MPL U/mL — ABNORMAL LOW (ref <11)

## 2012-11-17 LAB — PROTEIN C ACTIVITY: Protein C Activity: 124 % (ref 75–133)

## 2012-11-17 LAB — PROTEIN S ACTIVITY: Protein S Activity: 71 % (ref 69–129)

## 2012-11-17 LAB — BETA-2-GLYCOPROTEIN I ABS, IGG/M/A: Beta-2-Glycoprotein I IgA: 0 A Units (ref <20)

## 2012-11-17 MED ORDER — FERROUS SULFATE 325 (65 FE) MG PO TABS
325.0000 mg | ORAL_TABLET | Freq: Three times a day (TID) | ORAL | Status: DC
  Administered 2012-11-17 – 2012-11-20 (×9): 325 mg via ORAL
  Filled 2012-11-17 (×15): qty 1

## 2012-11-17 NOTE — Progress Notes (Signed)
[id number] Cardiac Rehab Started MI education with pt. Gave her MI book and left heart healthy diet sheet. We discussed risk factors, modifications and Outpt. CRP. She is motivated to making life style changes. We will continue to follow pt. Melina Copa RN

## 2012-11-17 NOTE — Progress Notes (Signed)
  Date: 11/17/2012  Patient name: Abigail Garcia  Medical record number: [id number]  Date of birth: 04/14/1971   This patient has been seen and the plan of care was discussed with the house staff. Please see their note for complete details. I concur with their findings with the following additions/corrections:  Some swelling at right wrist site. Radial pulse intact with no difficulty moving her right arm/hand. Capillary refill appropriate. Cardiology following. S/p LHC. Found to have thrombus mid LAD. Currently on integrillin, heparin, and Brilinta. She did not have a stent placed.  Unfortunately, obtaining a hypercoagulable panel while on heparin is not useful.  Start iron PO tid. She needs outpatient F/U for further workup, GI vs GYN causes.  Jonah Blue, DO 11/17/2012, 2:12 PM

## 2012-11-17 NOTE — Progress Notes (Signed)
ANTICOAGULATION CONSULT NOTE - Initial Consult  Pharmacy Consult for Heparin/Integrilin  Indication: chest pain/ACS  No Known Allergies  Patient Measurements: Height: 5\' 2"  (157.5 cm) Weight: 247 lb 12.8 oz (112.401 kg) IBW/kg (Calculated) : 50.1 Heparin Dosing Weight: 80 kg   Vital Signs: Temp: 97.9 F (36.6 C) (06/17 0501) Temp src: Oral (06/17 0501) BP: 117/72 mmHg (06/17 0501) Pulse Rate: 77 (06/17 0501)  Labs:  Recent Labs  11/14/12 1430  11/14/12 2344 11/15/12 0540 11/16/12 0009 11/16/12 0046 11/16/12 0737 11/17/12 0824  HGB  --   < > 9.0*  --  9.1* 11.6* 8.5* 8.8*  HCT  --   < > 30.7*  --  32.0* 34.0* 29.4* 30.9*  PLT  --   < > 730*  --  547*  --  665* 653*  APTT 84*  --   --   --   --   --  79*  --   LABPROT 14.4  --   --   --   --   --  13.7  --   INR 1.14  --   --   --   --   --  1.06  --   HEPARINUNFRC  --   < > 0.40 0.18*  --   --  0.64 0.27*  CREATININE  --   < > 0.79  --  0.76 0.90 0.68  --   TROPONINI  --   < > 2.84* 1.58* 1.51*  --   --   --   < > = values in this interval not displayed.  Estimated Creatinine Clearance: 109.6 ml/min (by C-G formula based on Cr of 0.68).   Medical History: Past Medical History  Diagnosis Date  . RA (rheumatoid arthritis)     Medications:  None  Assessment: 43 yo female with NSTEMI, s/p cath 6/16, which revealed a thrombus in the mid LAD, he was restarted with IV heparin, and also on integrilin for 72 hrs and brilinta. Heparin level (0.27) is slightly subtherapeutic on 1300 units/hr this morning. hgb 8.8, plt 653, no bleeding noted,  renal function stable. Plan for PCI again on 6/19. Protein C/S, factor 5 leiden, lupas anticoagulant pending   Goal of Therapy:  Heparin level 0.3-0.5 units/ml while on Integrilin  Monitor platelets by anticoagulation protocol: Yes   Plan:  - Increase IV heparin slightly to 1400 units/hr - recheck heparin level at 1700 - continue integrilin at current rate - monitor symptoms  of bleeding closely   Bayard Hugger, PharmD, BCPS  Clinical Pharmacist  Pager: [phone]   11/17/2012,10:10 AM

## 2012-11-17 NOTE — Progress Notes (Signed)
ANTICOAGULATION CONSULT NOTE   Pharmacy Consult for Heparin/Integrilin  Indication: chest pain/ACS  No Known Allergies  Patient Measurements: Height: 5\' 2"  (157.5 cm) Weight: 247 lb 12.8 oz (112.401 kg) IBW/kg (Calculated) : 50.1 Heparin Dosing Weight: 80 kg   Vital Signs: Temp: 97.6 F (36.4 C) (06/17 1519) Temp src: Oral (06/17 1519) BP: 119/79 mmHg (06/17 1519) Pulse Rate: 85 (06/17 1519)  Labs:  Recent Labs  11/14/12 2344 11/15/12 0540 11/16/12 0009 11/16/12 0046 11/16/12 0737 11/17/12 0824 11/17/12 1742  HGB 9.0*  --  9.1* 11.6* 8.5* 8.8*  --   HCT 30.7*  --  32.0* 34.0* 29.4* 30.9*  --   PLT 730*  --  547*  --  665* 653*  --   APTT  --   --   --   --  79*  --   --   LABPROT  --   --   --   --  13.7  --   --   INR  --   --   --   --  1.06  --   --   HEPARINUNFRC 0.40 0.18*  --   --  0.64 0.27* 0.47  CREATININE 0.79  --  0.76 0.90 0.68  --   --   TROPONINI 2.84* 1.58* 1.51*  --   --   --   --     Estimated Creatinine Clearance: 109.6 ml/min (by C-G formula based on Cr of 0.68).   Medical History: Past Medical History  Diagnosis Date  . RA (rheumatoid arthritis)    Assessment: 42 yo female with NSTEMI, s/p cath 6/16, which revealed a thrombus in the mid LAD, he was restarted with IV heparin, and also on integrilin for 72 hrs and brilinta.   Plan for PCI again on 6/19. Protein C/S, factor 5 leiden, lupas anticoagulant pending  Heparin level now therapeutic at 0.47   Goal of Therapy:  Heparin level 0.3-0.5 units/ml while on Integrilin  Monitor platelets by anticoagulation protocol: Yes   Plan:  - Continue heparin at 1400 units/hr - continue integrilin at current rate - monitor symptoms of bleeding closely  Thank you. Okey Regal, PharmD [phone]   11/17/2012,6:53 PM

## 2012-11-17 NOTE — Progress Notes (Signed)
Patient ID: Abigail Garcia, female   DOB: 23-Oct-1970, 42 y.o.   MRN: [id number]    SUBJECTIVE: No chest pain or dyspnea.  She had some swelling at right wrist cath site that is going down.  No pain.   Marland Kitchen aspirin EC  81 mg Oral Daily  . atorvastatin  80 mg Oral q1800  . ferrous sulfate  325 mg Oral TID WC  . metoprolol tartrate  12.5 mg Oral BID  . sodium chloride  3 mL Intravenous Q12H  . Ticagrelor  90 mg Oral BID  integrilin gtt Heparin gtt    Filed Vitals:   11/16/12 1337 11/16/12 1943 11/17/12 0501 11/17/12 1032  BP:  118/67 117/72 124/74  Pulse: 77 72 77 82  Temp:  98.2 F (36.8 C) 97.9 F (36.6 C)   TempSrc:  Oral Oral   Resp:  16 16   Height:      Weight:   247 lb 12.8 oz (112.401 kg)   SpO2:  100% 100%     Intake/Output Summary (Last 24 hours) at 11/17/12 1352 Last data filed at 11/17/12 1254  Gross per 24 hour  Intake  918.8 ml  Output   2350 ml  Net -1431.2 ml    LABS: Basic Metabolic Panel:  Recent Labs  40/98/11 0009 11/16/12 0046 11/16/12 0737  NA 136 141 137  K 3.7 3.9 4.0  CL 99 105 103  CO2 24  --  24  GLUCOSE 105* 104* 97  BUN 13 13 13   CREATININE 0.76 0.90 0.68  CALCIUM 9.6  --  9.3   Liver Function Tests:  Recent Labs  11/16/12 0009  AST 17  ALT 10  ALKPHOS 77  BILITOT 0.2*  PROT 8.2  ALBUMIN 3.4*   No results found for this basename: LIPASE, AMYLASE,  in the last 72 hours CBC:  Recent Labs  11/16/12 0737 11/17/12 0824  WBC 12.1* 11.7*  HGB 8.5* 8.8*  HCT 29.4* 30.9*  MCV 61.3* 60.9*  PLT 665* 653*   Cardiac Enzymes:  Recent Labs  11/14/12 2344 11/15/12 0540 11/16/12 0009  TROPONINI 2.84* 1.58* 1.51*   BNP: No components found with this basename: POCBNP,  D-Dimer: No results found for this basename: DDIMER,  in the last 72 hours Hemoglobin A1C: No results found for this basename: HGBA1C,  in the last 72 hours Fasting Lipid Panel:  Recent Labs  11/14/12 2344  CHOL 211*  HDL 44  LDLCALC 143*  TRIG 118    CHOLHDL 4.8   Thyroid Function Tests: No results found for this basename: TSH, T4TOTAL, FREET3, T3FREE, THYROIDAB,  in the last 72 hours Anemia Panel: No results found for this basename: VITAMINB12, FOLATE, FERRITIN, TIBC, IRON, RETICCTPCT,  in the last 72 hours  RADIOLOGY: Dg Chest 2 View  11/14/2012   *RADIOLOGY REPORT*  Clinical Data: Chest pain  CHEST - 2 VIEW  Comparison: None.  Findings: The lungs are well-aerated and free from pulmonary edema, focal airspace consolidation or pulmonary nodule.  Cardiac and mediastinal contours are within normal limits.  No pneumothorax, or pleural effusion. No acute osseous findings.  IMPRESSION:  No acute cardiopulmonary disease.   Original Report Authenticated By: Malachy Moan, M.D.   Dg Chest Portable 1 View  11/16/2012   *RADIOLOGY REPORT*  Clinical Data: Mid chest pain  PORTABLE CHEST - 1 VIEW  Comparison: 11/14/2012  Findings: Cardiac leads project over the chest.  Heart size is normal and stable.  Lung volumes are low.  There  is peribronchial thickening bilaterally.  No focal consolidation, edema, or pleural effusion.  IMPRESSION: Mild peribronchial thickening bilaterally.  This can be seen in the setting of acute or chronic bronchitis, smoking, or asthma.  It may be accentuated by the patient's low lung volumes.   Original Report Authenticated By: Britta Mccreedy, M.D.    PHYSICAL EXAM General: NAD Neck: No JVD, no thyromegaly or thyroid nodule.  Lungs: Clear to auscultation bilaterally with normal respiratory effort. CV: Nondisplaced PMI.  Heart regular S1/S2, no S3/S4, no murmur.  No peripheral edema.  No carotid bruit.  Normal pedal pulses.  Abdomen: Soft, nontender, no hepatosplenomegaly, no distention.  Neurologic: Alert and oriented x 3.  Psych: Normal affect. Extremities: No clubbing or cyanosis. Mild swelling at right wrist cath site.  2+ radial pulse on right.   TELEMETRY: Reviewed telemetry pt in NSR  ASSESSMENT AND PLAN: 42 yo  with history of RA presented with NSTEMI, found to have thrombus in mLAD with 80-90% stenosis.   1. NSTEMI: Thrombus mLAD.  EF preserved on echo with no wall motion abnormalities.  She is on integrilin gtt and heparin gtt and was also started on Brilinta.  Would plan 1 year of Brilinta even if no stent placed (medical management of MI).  Plan for relook angiography on Thursday to see if thrombus has resolved with the above treatment.  Her right wrist was swollen after cath yesterday but swelling is decreasing and there is no pain.  Good radial pulse.  If swelling worsens, hold integrilin.  2. Anemia: Stable HCT post-transfusion.  Patient reports heavy periods.  She has Fe deficiency anemia.   [name] 11/17/2012 1:56 PM

## 2012-11-17 NOTE — Progress Notes (Signed)
UR Completed.  Abigail Garcia [phone] 11/17/2012

## 2012-11-17 NOTE — Progress Notes (Signed)
Subjective: S/p [facility] 6/16 with Cardiology which revealed a mid LAD thrombus with 80-90% stenosis. She was started on aggressive antithrombotic and antiplatelet therapy in addition to the heparin drip. She endorses some pain at the cath access site at her right wrist but denies any chest pain or SOB.  Objective: Vital signs in last 24 hours: Filed Vitals:   11/16/12 1317 11/16/12 1337 11/16/12 1943 11/17/12 0501  BP: 119/77  118/67 117/72  Pulse: 71 77 72 77  Temp:   98.2 F (36.8 C) 97.9 F (36.6 C)  TempSrc:   Oral Oral  Resp:   16 16  Height:      Weight:    247 lb 12.8 oz (112.401 kg)  SpO2: 98%  100% 100%   Weight change: -3 lb 8.4 oz (-1.599 kg)  Intake/Output Summary (Last 24 hours) at 11/17/12 0806 Last data filed at 11/17/12 0600  Gross per 24 hour  Intake  438.8 ml  Output   1750 ml  Net -1311.2 ml   Vitals reviewed. General: Sitting up in bed eating breakfast, NAD HEENT: PERRL, EOMI, no scleral icterus Cardiac: RRR, no rubs, murmurs or gallops Pulm: Clear to auscultation bilaterally, no wheezes, rales, or rhonchi Abd: Soft, nontender, nondistended, BS present Ext: Warm and well perfused, no pedal edema, 2+ radial pulses Neuro: Alert and oriented X3, cranial nerves II-XII grossly intact, strength and sensation to light touch equal in bilateral upper and lower extremities  Lab Results: Basic Metabolic Panel:  Recent Labs Lab 11/16/12 0009 11/16/12 0046 11/16/12 0737  NA 136 141 137  K 3.7 3.9 4.0  CL 99 105 103  CO2 24  --  24  GLUCOSE 105* 104* 97  BUN 13 13 13   CREATININE 0.76 0.90 0.68  CALCIUM 9.6  --  9.3   Liver Function Tests:  Recent Labs Lab 11/14/12 0806 11/16/12 0009  AST 16 17  ALT 9 10  ALKPHOS 66 77  BILITOT 0.2* 0.2*  PROT 7.5 8.2  ALBUMIN 3.0* 3.4*   CBC:  Recent Labs Lab 11/14/12 0806  11/16/12 0009 11/16/12 0046 11/16/12 0737  WBC 9.9  < > 13.0*  --  12.1*  NEUTROABS 5.6  --   --   --   --   HGB 7.6*  < > 9.1* 11.6*  8.5*  HCT 27.3*  < > 32.0* 34.0* 29.4*  MCV 59.5*  < > 61.3*  --  61.3*  PLT 689*  < > 547*  --  665*  < > = values in this interval not displayed. Cardiac Enzymes:  Recent Labs Lab 11/14/12 2344 11/15/12 0540 11/16/12 0009  TROPONINI 2.84* 1.58* 1.51*   BNP:  Recent Labs Lab 11/14/12 1302 11/14/12 1430  PROBNP 108.1 111.4   Hemoglobin A1C:  Recent Labs Lab 11/14/12 1135  HGBA1C 5.7*   Fasting Lipid Panel:  Recent Labs Lab 11/14/12 2344  CHOL 211*  HDL 44  LDLCALC 143*  TRIG 118  CHOLHDL 4.8   Thyroid Function Tests:  Recent Labs Lab 11/14/12 1113  TSH 1.181   Coagulation:  Recent Labs Lab 11/14/12 1430 11/16/12 0737  LABPROT 14.4 13.7  INR 1.14 1.06   Anemia Panel:  Recent Labs Lab 11/14/12 1113  VITAMINB12 599  FOLATE 19.0  FERRITIN 1*  TIBC Not calculated due to Iron <10.  IRON <10*  RETICCTPCT 1.9   Urine Drug Screen: Drugs of Abuse     Component Value Date/Time   LABOPIA NONE DETECTED 11/14/2012 1142  COCAINSCRNUR NONE DETECTED 11/14/2012 1142   LABBENZ NONE DETECTED 11/14/2012 1142   AMPHETMU NONE DETECTED 11/14/2012 1142   THCU NONE DETECTED 11/14/2012 1142   LABBARB NONE DETECTED 11/14/2012 1142     Micro Results: Recent Results (from the past 240 hour(s))  MRSA PCR SCREENING     Status: None   Collection Time    11/14/12  1:33 PM      Result Value Range Status   MRSA by PCR NEGATIVE  NEGATIVE Final   Comment:            The GeneXpert MRSA Assay (FDA     approved for NASAL specimens     only), is one component of a     comprehensive MRSA colonization     surveillance program. It is not     intended to diagnose MRSA     infection nor to guide or     monitor treatment for     MRSA infections.   Studies/Results: Dg Chest Portable 1 View  11/16/2012   *RADIOLOGY REPORT*  Clinical Data: Mid chest pain  PORTABLE CHEST - 1 VIEW  Comparison: 11/14/2012  Findings: Cardiac leads project over the chest.  Heart size is normal  and stable.  Lung volumes are low.  There is peribronchial thickening bilaterally.  No focal consolidation, edema, or pleural effusion.  IMPRESSION: Mild peribronchial thickening bilaterally.  This can be seen in the setting of acute or chronic bronchitis, smoking, or asthma.  It may be accentuated by the patient's low lung volumes.   Original Report Authenticated By: Britta Mccreedy, M.D.   Medications: I have reviewed the patient's current medications. Scheduled Meds: . aspirin EC  81 mg Oral Daily  . atorvastatin  80 mg Oral q1800  . ferrous sulfate  325 mg Oral BID WC  . metoprolol tartrate  12.5 mg Oral BID  . sodium chloride  3 mL Intravenous Q12H  . Ticagrelor  90 mg Oral BID   Continuous Infusions: . eptifibatide 2 mcg/kg/min (11/17/12 0401)  . heparin 1,300 Units/hr (11/17/12 0402)   PRN Meds:.acetaminophen, acetaminophen, morphine injection  Assessment/Plan: Pt is a 42 year old woman with PMH significant for rheumatoid arthritis not currently on any medications is s/p left cardiac cath 2/2 NSTEMI.  NSTEMI: Troponin 1.65 on initial hospital admission, peaking to 2.84 this admission and now down to 1.51. EKG w/o ST elevation however T wave inversion and Q waves are present in lead III this admission. Pt remains hemodynamically stable. ASA, BB, Lipitor, and heparin drip were all restarted this admission. [facility] has been following her and performed a LHC on 6/16 which revealed a thrombus in the mid LAD with eccentric 80-90% stenosis. In addition to the heparin, she was started on aggressive antithrombotic and antiplatelet therapy, and a hypercoagulability panel was also ordered. Antithrombin III and homocysteine are normal. She remains chest pain free.   - Heparin drip, eptifibatide, and Ticagrelor - ASA 81mg  daily  - Metoprolol  - Lipitor 80mg  QD  - NTG PRN  - Morphine PRN  - F/u hypercoagulability panel - F/u with Cardiology   Iron Deficiency Anemia: Hemoglobin 7.6 on  initial admission and corrected to 9.2 after one 1 unit of PRBC in the setting of NSTEMI. MCV ~ 60. Anemia panel is classic for iron deficiency anemia: UIBC 452, ferritin of 1. She denies any melena or bloody stools. Denies any history of sickle cell disease/trait or thalassemia. She states she's had one blood transfusion in the past for  this issue when she was pregnant approximately 9 years ago. She has not been on any recent iron supplementation. Stable today at 8.8.  Recent Labs Lab 11/14/12 2344 11/16/12 0009 11/16/12 0046 11/16/12 0737 11/17/12 0824  HGB 9.0* 9.1* 11.6* 8.5* 8.8*   - Starting iron supplementation TID - She will need outpatient w/o for the etiology of her anemia   Rheumatoid arthritis: Patient reports a history of rheumatoid arthritis. She states she was last seen by a rheumatologist approximately one year ago, at which time she was prescribed prednisone however she states she has not taken this medication or any other DMARD's for approximately one year. She denies any recent symptoms/flares associated with her RA.  - She will need outpatient f/u with Rheumatology  DVT PPx: On heparin drip   Dispo: Disposition is deferred at this time, awaiting improvement of current medical problems.  Anticipated discharge in approximately 2-3 day(s).   The patient does not have a current PCP (No PCP Per Patient), therefore will possibly be requiring OPC follow-up after discharge.   The patient does not have transportation limitations that hinder transportation to clinic appointments.  .Services Needed at time of discharge: Y = Yes, Blank = No PT:   OT:   RN:   Equipment:   Other:     LOS: 2 days   Genelle Gather 11/17/2012, 8:06 AM

## 2012-11-18 LAB — CBC
HCT: 29.8 % — ABNORMAL LOW (ref 36.0–46.0)
Hemoglobin: 8.4 g/dL — ABNORMAL LOW (ref 12.0–15.0)
MCHC: 28.2 g/dL — ABNORMAL LOW (ref 30.0–36.0)
RBC: 4.88 MIL/uL (ref 3.87–5.11)

## 2012-11-18 LAB — BASIC METABOLIC PANEL
CO2: 22 mEq/L (ref 19–32)
Chloride: 102 mEq/L (ref 96–112)
Glucose, Bld: 124 mg/dL — ABNORMAL HIGH (ref 70–99)
Potassium: 3.3 mEq/L — ABNORMAL LOW (ref 3.5–5.1)
Sodium: 134 mEq/L — ABNORMAL LOW (ref 135–145)

## 2012-11-18 LAB — HEPARIN LEVEL (UNFRACTIONATED): Heparin Unfractionated: 0.39 IU/mL (ref 0.30–0.70)

## 2012-11-18 MED ORDER — SODIUM CHLORIDE 0.9 % IJ SOLN: 3.0000 mL | INTRAMUSCULAR | Status: DC | PRN

## 2012-11-18 MED ORDER — SODIUM CHLORIDE 0.9 % IV SOLN: 250.0000 mL | INTRAVENOUS | Status: DC | PRN

## 2012-11-18 MED ORDER — SODIUM CHLORIDE 0.9 % IJ SOLN
3.0000 mL | Freq: Two times a day (BID) | INTRAMUSCULAR | Status: DC
  Administered 2012-11-18: 3 mL via INTRAVENOUS

## 2012-11-18 MED ORDER — SODIUM CHLORIDE 0.9 % IV SOLN: 1.0000 mL/kg/h | INTRAVENOUS | Status: DC

## 2012-11-18 MED ORDER — ASPIRIN 81 MG PO CHEW
324.0000 mg | CHEWABLE_TABLET | ORAL | Status: AC
  Administered 2012-11-19: 324 mg via ORAL
  Filled 2012-11-18: qty 4

## 2012-11-18 MED ORDER — POTASSIUM CHLORIDE CRYS ER 20 MEQ PO TBCR
40.0000 meq | EXTENDED_RELEASE_TABLET | Freq: Once | ORAL | Status: AC
  Administered 2012-11-18: 40 meq via ORAL
  Filled 2012-11-18: qty 2

## 2012-11-18 NOTE — Progress Notes (Signed)
ANTICOAGULATION CONSULT NOTE   Pharmacy Consult for Heparin/Integrilin  Indication: chest pain/ACS  No Known Allergies  Patient Measurements: Height: 5\' 2"  (157.5 cm) Weight: 245 lb 9.6 oz (111.403 kg) IBW/kg (Calculated) : 50.1 Heparin Dosing Weight: 80 kg   Vital Signs: Temp: 98 F (36.7 C) (06/18 0445) Temp src: Oral (06/18 0445) BP: 110/65 mmHg (06/18 0445) Pulse Rate: 80 (06/18 0445)  Labs:  Recent Labs  11/16/12 0009 11/16/12 0046  11/16/12 0737 11/17/12 0824 11/17/12 1742 11/18/12 0600  HGB 9.1* 11.6*  --  8.5* 8.8*  --  8.4*  HCT 32.0* 34.0*  --  29.4* 30.9*  --  29.8*  PLT 547*  --   --  665* 653*  --  608*  APTT  --   --   --  79*  --   --   --   LABPROT  --   --   --  13.7  --   --   --   INR  --   --   --  1.06  --   --   --   HEPARINUNFRC  --   --   < > 0.64 0.27* 0.47 0.39  CREATININE 0.76 0.90  --  0.68  --   --  0.65  TROPONINI 1.51*  --   --   --   --   --   --   < > = values in this interval not displayed.  Estimated Creatinine Clearance: 109 ml/min (by C-G formula based on Cr of 0.65).   Medical History: Past Medical History  Diagnosis Date  . RA (rheumatoid arthritis)    Assessment: 42 yo female with NSTEMI, s/p cath 6/16, which revealed a thrombus in the mid LAD, he was restarted on IV heparin 1400 uts/hr HL at goal 0.39, also started on integrilin 52mcg/kg/min and brilinta 90mg  bid.  CBC stable, renal fx stable  Plan for 72hr anticoagulation/antithrombotic therapy and recath again on 6/19.     Goal of Therapy:  Heparin level 0.3-0.5 units/ml while on Integrilin  Monitor platelets by anticoagulation protocol: Yes   Plan:  - Continue heparin at 1400 units/hr - continue integrilin 2mg /kg/min - monitor symptoms of bleeding closely  Leota Sauers Pharm.D. CPP, BCPS Clinical Pharmacist [phone] 11/18/2012 11:42 AM

## 2012-11-18 NOTE — Progress Notes (Signed)
Subjective: Pain and swelling at the cath access site at her right wrist have resolved. She continues to deny any chest pain or SOB.  Objective: Vital signs in last 24 hours: Filed Vitals:   11/17/12 1519 11/17/12 2011 11/18/12 0445 11/18/12 1146  BP: 119/79 114/72 110/65 106/61  Pulse: 85 90 80 84  Temp: 97.6 F (36.4 C) 98.1 F (36.7 C) 98 F (36.7 C)   TempSrc: Oral Oral Oral   Resp: 16 18 18    Height:      Weight:   245 lb 9.6 oz (111.403 kg)   SpO2: 100% 100% 99%    Weight change: -2 lb 3.2 oz (-0.998 kg)  Intake/Output Summary (Last 24 hours) at 11/18/12 1414 Last data filed at 11/18/12 0448  Gross per 24 hour  Intake      0 ml  Output   1000 ml  Net  -1000 ml   Vitals reviewed. General: Sitting up in bed eating breakfast, NAD HEENT: PERRL, EOMI, no scleral icterus Cardiac: RRR, no rubs, murmurs or gallops Pulm: Clear to auscultation bilaterally, no wheezes, rales, or rhonchi Abd: Soft, nontender, nondistended, BS present Ext: Warm and well perfused, no pedal edema, 2+ radial and DP pulses Neuro: Alert and oriented X3, cranial nerves II-XII grossly intact, strength and sensation to light touch equal in bilateral upper and lower extremities  Lab Results: Basic Metabolic Panel:  Recent Labs Lab 11/16/12 0737 11/18/12 0600  NA 137 134*  K 4.0 3.3*  CL 103 102  CO2 24 22  GLUCOSE 97 124*  BUN 13 8  CREATININE 0.68 0.65  CALCIUM 9.3 9.4   Liver Function Tests:  Recent Labs Lab 11/14/12 0806 11/16/12 0009  AST 16 17  ALT 9 10  ALKPHOS 66 77  BILITOT 0.2* 0.2*  PROT 7.5 8.2  ALBUMIN 3.0* 3.4*   CBC:  Recent Labs Lab 11/14/12 0806  11/17/12 0824 11/18/12 0600  WBC 9.9  < > 11.7* 12.1*  NEUTROABS 5.6  --   --   --   HGB 7.6*  < > 8.8* 8.4*  HCT 27.3*  < > 30.9* 29.8*  MCV 59.5*  < > 60.9* 61.1*  PLT 689*  < > 653* 608*  < > = values in this interval not displayed. Cardiac Enzymes:  Recent Labs Lab 11/14/12 2344 11/15/12 0540  11/16/12 0009  TROPONINI 2.84* 1.58* 1.51*   BNP:  Recent Labs Lab 11/14/12 1302 11/14/12 1430  PROBNP 108.1 111.4   Hemoglobin A1C:  Recent Labs Lab 11/14/12 1135  HGBA1C 5.7*   Fasting Lipid Panel:  Recent Labs Lab 11/14/12 2344  CHOL 211*  HDL 44  LDLCALC 143*  TRIG 118  CHOLHDL 4.8   Thyroid Function Tests:  Recent Labs Lab 11/14/12 1113  TSH 1.181   Coagulation:  Recent Labs Lab 11/14/12 1430 11/16/12 0737  LABPROT 14.4 13.7  INR 1.14 1.06   Anemia Panel:  Recent Labs Lab 11/14/12 1113  VITAMINB12 599  FOLATE 19.0  FERRITIN 1*  TIBC Not calculated due to Iron <10.  IRON <10*  RETICCTPCT 1.9   Urine Drug Screen: Drugs of Abuse     Component Value Date/Time   LABOPIA NONE DETECTED 11/14/2012 1142   COCAINSCRNUR NONE DETECTED 11/14/2012 1142   LABBENZ NONE DETECTED 11/14/2012 1142   AMPHETMU NONE DETECTED 11/14/2012 1142   THCU NONE DETECTED 11/14/2012 1142   LABBARB NONE DETECTED 11/14/2012 1142     Micro Results: Recent Results (from the past 240 hour(s))  MRSA PCR SCREENING     Status: None   Collection Time    11/14/12  1:33 PM      Result Value Range Status   MRSA by PCR NEGATIVE  NEGATIVE Final   Comment:            The GeneXpert MRSA Assay (FDA     approved for NASAL specimens     only), is one component of a     comprehensive MRSA colonization     surveillance program. It is not     intended to diagnose MRSA     infection nor to guide or     monitor treatment for     MRSA infections.   Studies/Results: No results found. Medications: I have reviewed the patient's current medications. Scheduled Meds: . aspirin EC  81 mg Oral Daily  . atorvastatin  80 mg Oral q1800  . ferrous sulfate  325 mg Oral TID WC  . metoprolol tartrate  12.5 mg Oral BID  . potassium chloride  40 mEq Oral Once  . potassium chloride  40 mEq Oral Once  . sodium chloride  3 mL Intravenous Q12H  . Ticagrelor  90 mg Oral BID   Continuous  Infusions: . eptifibatide 2 mcg/kg/min (11/18/12 0804)  . heparin 1,400 Units/hr (11/17/12 2315)   PRN Meds:.acetaminophen, acetaminophen, morphine injection  Assessment/Plan: Pt is a 42 year old woman with PMH significant for rheumatoid arthritis not currently on any medications is s/p left cardiac cath 2/2 NSTEMI.  NSTEMI: Troponin 1.65 on initial hospital admission, peaking to 2.84 this admission and now down to 1.51. EKG w/o ST elevation however T wave inversion and Q waves are present in lead III this admission. Pt remains hemodynamically stable. ASA, BB, Lipitor, and heparin drip were all restarted this admission.  Cardiology has been following her and performed a LHC on 6/16 which revealed a thrombus in the mid LAD with eccentric 80-90% stenosis. In addition to the heparin, she was started on aggressive antithrombotic and antiplatelet therapy, and a hypercoagulability panel was also ordered, unfortunately the heparin drip was already started, so will need to repeat in 6 months if still indicated. She remains chest pain free.  Plan for repeat angio tomorrow.  - Heparin drip, eptifibatide, and Ticagrelor - ASA 81mg  daily  - Metoprolol  - Lipitor 80mg  QD  - NTG PRN  - Morphine PRN  - F/u hypercoagulability panel - F/u with Cardiology   Iron Deficiency Anemia: History of heavy menses. Hemoglobin 7.6 on initial admission and corrected to 9.2 after one 1 unit of PRBC in the setting of NSTEMI. MCV ~ 60. Anemia panel is classic for iron deficiency anemia: UIBC 452, ferritin of 1. She denies any melena or bloody stools. Denies any history of sickle cell disease/trait or thalassemia. She states she's had one blood transfusion in the past for this issue when she was pregnant approximately 9 years ago. She has not been on any recent iron supplementation, so started iron replacement TID with meals this admission. Hgb stable today at 8.4.  Recent Labs Lab 11/16/12 0009 11/16/12 0046  11/16/12 0737 11/17/12 0824 11/18/12 0600  HGB 9.1* 11.6* 8.5* 8.8* 8.4*   - Iron supplementation TID w/ meals - She will need outpatient w/o for the etiology of her anemia   Rheumatoid arthritis: Patient reports a history of rheumatoid arthritis. She states she was last seen by a rheumatologist approximately one year ago, at which time she was prescribed prednisone however she states she  has not taken this medication or any other DMARD's for approximately one year. She denies any recent symptoms/flares associated with her RA.  - She will need outpatient f/u with Rheumatology  DVT PPx: On heparin drip   Dispo: Disposition is deferred at this time, awaiting improvement of current medical problems.  Anticipated discharge in approximately 1-3 day(s).   The patient does not have a current PCP (No PCP Per Patient), therefore will possibly be requiring OPC follow-up after discharge.   The patient does not have transportation limitations that hinder transportation to clinic appointments.  .Services Needed at time of discharge: Y = Yes, Blank = No PT:   OT:   RN:   Equipment:   Other:     LOS: 3 days   Genelle Gather 11/18/2012, 2:14 PM

## 2012-11-18 NOTE — Progress Notes (Signed)
CARDIAC REHAB PHASE I   PRE:  Rate/Rhythm: 91SR  BP:  Supine:   Sitting: 104/70  Standing:    SaO2: 100%RA  MODE:  Ambulation: 510 ft   POST:  Rate/Rhythm: 91SR  BP:  Supine:   Sitting: 110/78  Standing:    SaO2: 98%RA 1447-1510 Pt walked 510 ft on RA with steady gait. Tolerated well. No CP.  To chair after walk.   Luetta Nutting, RN BSN  11/18/2012 3:04 PM

## 2012-11-18 NOTE — Progress Notes (Signed)
  Date: 11/18/2012  Patient name: Abigail Garcia  Medical record number: [id number]  Date of birth: 1970-08-24   This patient has been seen and the plan of care was discussed with the house staff. Please see their note for complete details. I concur with their findings with the following additions/corrections:  No CP. Mild SOB today, could be Brilinta side effect. Her right wrist is less swollen.  S/p NSTEMI. Awaiting repeat LHC.  Cont current heparin gtt, integrillin gtt, brillinta unless cardiology feels a need to change this. Iron def anemia, needs iron supplementation. Will need outpatient work up. NO active bleeding.   Jonah Blue, DO 11/18/2012, 1:49 PM

## 2012-11-18 NOTE — Care Management Note (Unsigned)
    Page 1 of 1   11/18/2012     4:01:29 PM   CARE MANAGEMENT NOTE 11/18/2012  Patient:  Abigail Garcia, Abigail Garcia   Account Number:  [id number]  Date Initiated:  11/18/2012  Documentation initiated by:  Jacen Carlini  Subjective/Objective Assessment:   PT ADM ON 11/15/12 WITH NSTEMI, CLOT TO LAD.  PTA, PT RESIDES AT HOME WITH CHILDREN AND IS INDEPENDENT.     Action/Plan:   PT CURRENTLY ON BRILINTA.  GAVE PT 30 DAY FREE TRIAL CARD. SHE STATES SHE MAY NOT STAY ON BRILINTA, PER MD, AS SHE IS HAVING SOME SIDE EFFECTS.  WILL FOLLOW.   Anticipated DC Date:  11/21/2012   Anticipated DC Plan:  HOME/SELF CARE      DC Planning Services  CM consult  Medication Assistance      Choice offered to / List presented to:             Status of service:  In process, will continue to follow Medicare Important Message given?   (If response is "NO", the following Medicare IM given date fields will be blank) Date Medicare IM given:   Date Additional Medicare IM given:    Discharge Disposition:    Per UR Regulation:  Reviewed for med. necessity/level of care/duration of stay  If discussed at Long Length of Stay Meetings, dates discussed:    Comments:

## 2012-11-18 NOTE — Progress Notes (Signed)
Patient ID: Abigail Garcia, female   DOB: 12/25/1970, 41 y.o.   MRN: [id number]    SUBJECTIVE: No chest pain.  Short of breath after taking Brilinta.  Decreased right wrist swelling and no pain.   . aspirin EC  81 mg Oral Daily  . atorvastatin  80 mg Oral q1800  . ferrous sulfate  325 mg Oral TID WC  . metoprolol tartrate  12.5 mg Oral BID  . potassium chloride  40 mEq Oral Once  . sodium chloride  3 mL Intravenous Q12H  . Ticagrelor  90 mg Oral BID  integrilin gtt Heparin gtt    Filed Vitals:   11/17/12 1519 11/17/12 2011 11/18/12 0445 11/18/12 1146  BP: 119/79 114/72 110/65 106/61  Pulse: 85 90 80 84  Temp: 97.6 F (36.4 C) 98.1 F (36.7 C) 98 F (36.7 C)   TempSrc: Oral Oral Oral   Resp: 16 18 18   Height:      Weight:   245 lb 9.6 oz (111.403 kg)   SpO2: 100% 100% 99%     Intake/Output Summary (Last 24 hours) at 11/18/12 1334 Last data filed at 11/18/12 0448  Gross per 24 hour  Intake      0 ml  Output   1000 ml  Net  -1000 ml    LABS: Basic Metabolic Panel:  Recent Labs  11/16/12 0737 11/18/12 0600  NA 137 134*  K 4.0 3.3*  CL 103 102  CO2 24 22  GLUCOSE 97 124*  BUN 13 8  CREATININE 0.68 0.65  CALCIUM 9.3 9.4   Liver Function Tests:  Recent Labs  11/16/12 0009  AST 17  ALT 10  ALKPHOS 77  BILITOT 0.2*  PROT 8.2  ALBUMIN 3.4*   No results found for this basename: LIPASE, AMYLASE,  in the last 72 hours CBC:  Recent Labs  11/17/12 0824 11/18/12 0600  WBC 11.7* 12.1*  HGB 8.8* 8.4*  HCT 30.9* 29.8*  MCV 60.9* 61.1*  PLT 653* 608*   Cardiac Enzymes:  Recent Labs  11/16/12 0009  TROPONINI 1.51*   BNP: No components found with this basename: POCBNP,  D-Dimer: No results found for this basename: DDIMER,  in the last 72 hours Hemoglobin A1C: No results found for this basename: HGBA1C,  in the last 72 hours Fasting Lipid Panel: No results found for this basename: CHOL, HDL, LDLCALC, TRIG, CHOLHDL, LDLDIRECT,  in the last 72  hours Thyroid Function Tests: No results found for this basename: TSH, T4TOTAL, FREET3, T3FREE, THYROIDAB,  in the last 72 hours Anemia Panel: No results found for this basename: VITAMINB12, FOLATE, FERRITIN, TIBC, IRON, RETICCTPCT,  in the last 72 hours  RADIOLOGY: Dg Chest 2 View  11/14/2012   *RADIOLOGY REPORT*  Clinical Data: Chest pain  CHEST - 2 VIEW  Comparison: None.  Findings: The lungs are well-aerated and free from pulmonary edema, focal airspace consolidation or pulmonary nodule.  Cardiac and mediastinal contours are within normal limits.  No pneumothorax, or pleural effusion. No acute osseous findings.  IMPRESSION:  No acute cardiopulmonary disease.   Original Report Authenticated By: Heath McCullough, M.D.   Dg Chest Portable 1 View  11/16/2012   *RADIOLOGY REPORT*  Clinical Data: Mid chest pain  PORTABLE CHEST - 1 VIEW  Comparison: 11/14/2012  Findings: Cardiac leads project over the chest.  Heart size is normal and stable.  Lung volumes are low.  There is peribronchial thickening bilaterally.  No focal consolidation, edema, or pleural effusion.    IMPRESSION: Mild peribronchial thickening bilaterally.  This can be seen in the setting of acute or chronic bronchitis, smoking, or asthma.  It may be accentuated by the patient's low lung volumes.   Original Report Authenticated By: Susan Turner, M.D.    PHYSICAL EXAM General: NAD Neck: No JVD, no thyromegaly or thyroid nodule.  Lungs: Clear to auscultation bilaterally with normal respiratory effort. CV: Nondisplaced PMI.  Heart regular S1/S2, no S3/S4, no murmur.  No peripheral edema.  No carotid bruit.  Normal pedal pulses.  Abdomen: Soft, nontender, no hepatosplenomegaly, no distention.  Neurologic: Alert and oriented x 3.  Psych: Normal affect. Extremities: No clubbing or cyanosis. Mild swelling at right wrist cath site.  2+ radial pulse on right.   TELEMETRY: Reviewed telemetry pt in NSR  ASSESSMENT AND PLAN: 41 yo with  history of RA presented with NSTEMI, found to have thrombus in mLAD with 80-90% stenosis.   1. NSTEMI: Thrombus mLAD.  EF preserved on echo with no wall motion abnormalities.  She is on integrilin gtt and heparin gtt and was also started on Brilinta.  Plan for relook angiography on Thursday to see if thrombus has resolved with the above treatment.  Given right wrist swelling post-cath, would use right groin.  She has noted dyspnea after taking Brilinta.  This is a possible Brilinta side effect.  Will continue for now but if this persists, would have her on Plavix rather than Brilinta (Effient if she needs a stent).  2. Anemia: Stable HCT post-transfusion.  Patient reports heavy periods.  She has Fe deficiency anemia.  CBC in am.   Beautiful Pensyl 11/18/2012 1:34 PM  

## 2012-11-19 ENCOUNTER — Encounter (HOSPITAL_COMMUNITY)
Admission: EM | Disposition: A | Discharge status: Home / Self Care | Payer: Self-pay | Source: Home / Self Care | Attending: Internal Medicine

## 2012-11-19 DIAGNOSIS — D649 Anemia, unspecified: Secondary | ICD-10-CM

## 2012-11-19 DIAGNOSIS — I2510 Atherosclerotic heart disease of native coronary artery without angina pectoris: Secondary | ICD-10-CM

## 2012-11-19 DIAGNOSIS — I214 Non-ST elevation (NSTEMI) myocardial infarction: Secondary | ICD-10-CM | POA: Diagnosis not present

## 2012-11-19 DIAGNOSIS — D509 Iron deficiency anemia, unspecified: Secondary | ICD-10-CM | POA: Diagnosis not present

## 2012-11-19 HISTORY — PX: PERCUTANEOUS CORONARY INTERVENTION-BALLOON ONLY: SHX6014

## 2012-11-19 HISTORY — PX: CORONARY ANGIOGRAM: SHX5466

## 2012-11-19 HISTORY — PX: INTRAVASCULAR ULTRASOUND: SHX5452

## 2012-11-19 LAB — CBC
HCT: 29.5 % — ABNORMAL LOW (ref 36.0–46.0)
Hemoglobin: 8.5 g/dL — ABNORMAL LOW (ref 12.0–15.0)
MCH: 17.7 pg — ABNORMAL LOW (ref 26.0–34.0)
MCV: 61.5 fL — ABNORMAL LOW (ref 78.0–100.0)
RBC: 4.8 MIL/uL (ref 3.87–5.11)
WBC: 12.8 10*3/uL — ABNORMAL HIGH (ref 4.0–10.5)

## 2012-11-19 LAB — BASIC METABOLIC PANEL
BUN: 9 mg/dL (ref 6–23)
Calcium: 9.7 mg/dL (ref 8.4–10.5)
Creatinine, Ser: 0.66 mg/dL (ref 0.50–1.10)
GFR calc Af Amer: >90 mL/min (ref >90)
GFR calc non Af Amer: >90 mL/min (ref >90)
Glucose, Bld: 99 mg/dL (ref 70–99)
Potassium: 4 mEq/L (ref 3.5–5.1)

## 2012-11-19 LAB — POCT ACTIVATED CLOTTING TIME: Activated Clotting Time: 236 seconds

## 2012-11-19 SURGERY — LEFT HEART CATH
Anesthesia: Moderate Sedation

## 2012-11-19 SURGERY — CORONARY ANGIOGRAM
Laterality: Bilateral

## 2012-11-19 MED ORDER — FENTANYL CITRATE 0.05 MG/ML IJ SOLN
INTRAMUSCULAR | Status: AC
  Filled 2012-11-19: qty 2

## 2012-11-19 MED ORDER — EPTIFIBATIDE 75 MG/100ML IV SOLN
INTRAVENOUS | Status: AC
  Filled 2012-11-19: qty 100

## 2012-11-19 MED ORDER — HEPARIN SODIUM (PORCINE) 1000 UNIT/ML IJ SOLN
INTRAMUSCULAR | Status: AC
  Filled 2012-11-19: qty 1

## 2012-11-19 MED ORDER — LIDOCAINE HCL (PF) 1 % IJ SOLN
INTRAMUSCULAR | Status: AC
  Filled 2012-11-19: qty 30

## 2012-11-19 MED ORDER — MIDAZOLAM HCL 2 MG/2ML IJ SOLN
INTRAMUSCULAR | Status: AC
  Filled 2012-11-19: qty 2

## 2012-11-19 MED ORDER — HEPARIN (PORCINE) IN NACL 2-0.9 UNIT/ML-% IJ SOLN
INTRAMUSCULAR | Status: AC
  Filled 2012-11-19: qty 1000

## 2012-11-19 MED ORDER — NITROGLYCERIN 0.2 MG/ML ON CALL CATH LAB
INTRAVENOUS | Status: AC
  Filled 2012-11-19: qty 1

## 2012-11-19 MED ORDER — EPTIFIBATIDE 75 MG/100ML IV SOLN
2.0000 ug/kg/min | INTRAVENOUS | Status: AC
  Administered 2012-11-19: 2 ug/kg/min via INTRAVENOUS
  Filled 2012-11-19: qty 100

## 2012-11-19 MED ORDER — VERAPAMIL HCL 2.5 MG/ML IV SOLN
INTRAVENOUS | Status: AC
  Filled 2012-11-19: qty 2

## 2012-11-19 MED ORDER — SODIUM CHLORIDE 0.9 % IV SOLN
1.0000 mL/kg/h | INTRAVENOUS | Status: AC
  Administered 2012-11-19: 1 mL/kg/h via INTRAVENOUS

## 2012-11-19 NOTE — Progress Notes (Signed)
LOS note written.

## 2012-11-19 NOTE — Progress Notes (Signed)
  Date: 11/19/2012  Patient name: Abigail Garcia  Medical record number: [id number]  Date of birth: 10-10-1970   This patient has been seen and the plan of care was discussed with the house staff. Please see their note for complete details. I concur with their findings with the following additions/corrections: S/p thrombus aspiration, PTCA and IVUS of LAD. Presence of thrombotic lesion in mid LAD. Cardiology following. CP free. Right radial artery site c/d/i. Pulses intact RUE. Medical management with dual antiplatelet therapy for 1 yr. Likely D/C in AM.  Jonah Blue, DO 11/19/2012, 2:23 PM

## 2012-11-19 NOTE — CV Procedure (Signed)
   CARDIAC CATH NOTE  Name: Abigail Garcia MRN: [id number] DOB: 20-Feb-1971  Procedure: Thrombus aspiration, PTCA and IVUS of the LAD  Indication: 42 year old black female with history of rheumatoid arthritis presented with a non-ST elevation myocardial infarction. Diagnostic cardiac catheterization demonstrated a thrombotic lesion in the mid LAD. No other obstructive disease noted. Patient was treated with aggressive anticoagulation and antiplatelet therapy. He was brought back for a peak angiography today.   Procedural Details: The right wrist was prepped, draped, and anesthetized with 1% lidocaine. Using the modified Seldinger technique, a 6 Fr sheath was introduced into the radial artery. 3 mg verapamil was administered through the radial sheath. Weight-based heparin was given for anticoagulation. Angiography demonstrated persistent thrombotic lesion in the mid LAD. This was focal. There was slight improvement compared to her prior study. However, it was still obstructive. Once a therapeutic ACT was achieved, a 6 [place] XB LAD 3.5 guide catheter was inserted.  A pro-water coronary guidewire was used to cross the lesion. We aspirated the lesion with a Pronto aspiration catheter. Angiographically there was significant improvement in the lesion appearance. We then performed intravascular ultrasound. This demonstrated that the LAD was a large vessel up to 4 mm in diameter in the mid vessel. There was diffuse nonobstructive plaque in the LAD. At the site of the lesion there was residual thrombus. No dissection flap was noted. The mechanism was felt to be plaque erosion with thrombosis. The lesion was then dilated with a 4.0 mm balloon.  This yielded an excellent angiographic result.  Following PCI, there was 0% residual stenosis and TIMI-3 flow.  The patient tolerated the procedure well. There were no immediate procedural complications. A TR band was used for radial hemostasis. The patient was transferred to the  post catheterization recovery area for further monitoring.  Lesion Data: Vessel: LAD Percent stenosis (pre): 80% TIMI-flow (pre):  3 Aspiration thrombectomy and balloon angioplasty Percent stenosis (post): 0% TIMI-flow (post): 3  Conclusions:  Successful PCI of the mid LAD for a thrombotic lesion. This was successfully treated with aspiration thrombectomy and balloon angioplasty.  Recommendations: We'll continue IV Integrilin for 6 hours. Continue dual antiplatelet therapy for one year. Aggressive risk factor modification.  Theron Arista [facility] 11/19/2012, 9:04 AM

## 2012-11-19 NOTE — Progress Notes (Signed)
Subjective: Pt getting repeat LHC today. She continues to deny any chest pain or SOB.  Objective: Vital signs in last 24 hours: Filed Vitals:   11/18/12 1146 11/18/12 1516 11/18/12 2003 11/19/12 0511  BP: 106/61 122/77 114/71 111/73  Pulse: 84 82 88 81  Temp:  97.5 F (36.4 C) 97.7 F (36.5 C) 98.1 F (36.7 C)  TempSrc:  Oral Oral Oral  Resp:  18 18 18   Height:      Weight:    245 lb 6 oz (111.3 kg)  SpO2:  100% 100% 97%   Weight change: -3.7 oz (-0.103 kg)  Intake/Output Summary (Last 24 hours) at 11/19/12 0835 Last data filed at 11/19/12 0513  Gross per 24 hour  Intake    480 ml  Output   2700 ml  Net  -2220 ml   Vitals reviewed. General: Sitting up in bed, NAD HEENT: PERRL, EOMI, no scleral icterus Cardiac: RRR, no rubs, murmurs or gallops Pulm: Clear to auscultation bilaterally, no wheezes, rales, or rhonchi Abd: Soft, nontender, nondistended, BS present Ext: Warm and well perfused, no pedal edema, 2+ radial and DP pulses Neuro: Alert and oriented X3, cranial nerves II-XII grossly intact, strength and sensation to light touch equal in bilateral upper and lower extremities  Lab Results: Basic Metabolic Panel:  Recent Labs Lab 11/18/12 0600 11/19/12 0603  NA 134* 137  K 3.3* 4.0  CL 102 103  CO2 22 23  GLUCOSE 124* 99  BUN 8 9  CREATININE 0.65 0.66  CALCIUM 9.4 9.7   Liver Function Tests:  Recent Labs Lab 11/14/12 0806 11/16/12 0009  AST 16 17  ALT 9 10  ALKPHOS 66 77  BILITOT 0.2* 0.2*  PROT 7.5 8.2  ALBUMIN 3.0* 3.4*   CBC:  Recent Labs Lab 11/14/12 0806  11/18/12 0600 11/19/12 0603  WBC 9.9  < > 12.1* 12.8*  NEUTROABS 5.6  --   --   --   HGB 7.6*  < > 8.4* 8.5*  HCT 27.3*  < > 29.8* 29.5*  MCV 59.5*  < > 61.1* 61.5*  PLT 689*  < > 608* 597*  < > = values in this interval not displayed. Cardiac Enzymes:  Recent Labs Lab 11/14/12 2344 11/15/12 0540 11/16/12 0009  TROPONINI 2.84* 1.58* 1.51*   BNP:  Recent Labs Lab  11/14/12 1302 11/14/12 1430  PROBNP 108.1 111.4   Hemoglobin A1C:  Recent Labs Lab 11/14/12 1135  HGBA1C 5.7*   Fasting Lipid Panel:  Recent Labs Lab 11/14/12 2344  CHOL 211*  HDL 44  LDLCALC 143*  TRIG 118  CHOLHDL 4.8   Thyroid Function Tests:  Recent Labs Lab 11/14/12 1113  TSH 1.181   Coagulation:  Recent Labs Lab 11/14/12 1430 11/16/12 0737  LABPROT 14.4 13.7  INR 1.14 1.06   Anemia Panel:  Recent Labs Lab 11/14/12 1113  VITAMINB12 599  FOLATE 19.0  FERRITIN 1*  TIBC Not calculated due to Iron <10.  IRON <10*  RETICCTPCT 1.9   Urine Drug Screen: Drugs of Abuse     Component Value Date/Time   LABOPIA NONE DETECTED 11/14/2012 1142   COCAINSCRNUR NONE DETECTED 11/14/2012 1142   LABBENZ NONE DETECTED 11/14/2012 1142   AMPHETMU NONE DETECTED 11/14/2012 1142   THCU NONE DETECTED 11/14/2012 1142   LABBARB NONE DETECTED 11/14/2012 1142     Micro Results: Recent Results (from the past 240 hour(s))  MRSA PCR SCREENING     Status: None   Collection Time  11/14/12  1:33 PM      Result Value Range Status   MRSA by PCR NEGATIVE  NEGATIVE Final   Comment:            The GeneXpert MRSA Assay (FDA     approved for NASAL specimens     only), is one component of a     comprehensive MRSA colonization     surveillance program. It is not     intended to diagnose MRSA     infection nor to guide or     monitor treatment for     MRSA infections.   Studies/Results: No results found. Medications: I have reviewed the patient's current medications. Scheduled Meds: . aspirin EC  81 mg Oral Daily  . atorvastatin  80 mg Oral q1800  . ferrous sulfate  325 mg Oral TID WC  . metoprolol tartrate  12.5 mg Oral BID  . sodium chloride  3 mL Intravenous Q12H  . sodium chloride  3 mL Intravenous Q12H  . Ticagrelor  90 mg Oral BID   Continuous Infusions: . sodium chloride 1 mL/kg/hr (11/19/12 0448)  . eptifibatide 2 mcg/kg/min (11/19/12 0439)  . heparin 1,400  Units/hr (11/18/12 1833)   PRN Meds:.sodium chloride, acetaminophen, acetaminophen, morphine injection, sodium chloride  Assessment/Plan: Pt is a 42 year old woman with PMH significant for rheumatoid arthritis not currently on any medications is s/p left cardiac cath 2/2 NSTEMI.  NSTEMI: Troponin 1.65 on initial hospital admission, peaking to 2.84 this admission and now down to 1.51. EKG w/o ST elevation however T wave inversion and Q waves are present in lead III this admission. Pt remains hemodynamically stable. ASA, BB, Lipitor, and heparin drip were all restarted this admission. [facility] has been following her and performed a LHC on 6/16 which revealed a thrombus in the mid LAD with eccentric 80-90% stenosis. In addition to the heparin, she was started on aggressive antithrombotic and antiplatelet therapy, and a hypercoagulability panel was also ordered, unfortunately the heparin drip was already started, so will need to repeat in 6 months if still indicated. Repeat LCH today. She remains chest pain free.   - Heparin drip, eptifibatide, and Ticagrelor per Cardiology - ASA 81mg  daily  - Metoprolol  - Lipitor 80mg  QD  - NTG PRN  - Morphine PRN  - F/u with Cardiology   Iron Deficiency Anemia: History of heavy menses. Hemoglobin 7.6 on initial admission and corrected to 9.2 after one 1 unit of PRBC in the setting of NSTEMI. MCV ~ 60. Anemia panel is classic for iron deficiency anemia: UIBC 452, ferritin of 1. She denies any melena or bloody stools. Denies any history of sickle cell disease/trait or thalassemia. She states she's had one blood transfusion in the past for this issue when she was pregnant approximately 9 years ago. She has not been on any recent iron supplementation, so started iron replacement TID with meals this admission. Hgb stable today at 8.5.  Recent Labs Lab 11/16/12 0046 11/16/12 0737 11/17/12 0824 11/18/12 0600 11/19/12 0603  HGB 11.6* 8.5* 8.8* 8.4* 8.5*    - Iron supplementation TID w/ meals - She will need outpatient w/o for the etiology of her anemia   Rheumatoid arthritis: Patient reports a history of rheumatoid arthritis. She states she was last seen by a rheumatologist approximately one year ago, at which time she was prescribed prednisone however she states she has not taken this medication or any other DMARD's for approximately one year. She denies any recent  symptoms/flares associated with her RA.  - She will need outpatient f/u with Rheumatology  DVT PPx: On heparin drip   Dispo: Disposition is deferred at this time, awaiting improvement of current medical problems.  Anticipated discharge in approximately 1-3 day(s).   The patient does not have a current PCP (No PCP Per Patient), therefore will possibly be requiring OPC follow-up after discharge.   The patient does not have transportation limitations that hinder transportation to clinic appointments.  .Services Needed at time of discharge: Y = Yes, Blank = No PT:   OT:   RN:   Equipment:   Other:     LOS: 4 days   Genelle Gather 11/19/2012, 8:35 AM

## 2012-11-19 NOTE — H&P (View-Only) (Signed)
Patient ID: Abigail Garcia, female   DOB: 02/17/1971, 42 y.o.   MRN: [id number]    SUBJECTIVE: No chest pain.  Short of breath after taking Brilinta.  Decreased right wrist swelling and no pain.   Marland Kitchen aspirin EC  81 mg Oral Daily  . atorvastatin  80 mg Oral q1800  . ferrous sulfate  325 mg Oral TID WC  . metoprolol tartrate  12.5 mg Oral BID  . potassium chloride  40 mEq Oral Once  . sodium chloride  3 mL Intravenous Q12H  . Ticagrelor  90 mg Oral BID  integrilin gtt Heparin gtt    Filed Vitals:   11/17/12 1519 11/17/12 2011 11/18/12 0445 11/18/12 1146  BP: 119/79 114/72 110/65 106/61  Pulse: 85 90 80 84  Temp: 97.6 F (36.4 C) 98.1 F (36.7 C) 98 F (36.7 C)   TempSrc: Oral Oral Oral   Resp: 16 18 18    Height:      Weight:   245 lb 9.6 oz (111.403 kg)   SpO2: 100% 100% 99%     Intake/Output Summary (Last 24 hours) at 11/18/12 1334 Last data filed at 11/18/12 0448  Gross per 24 hour  Intake      0 ml  Output   1000 ml  Net  -1000 ml    LABS: Basic Metabolic Panel:  Recent Labs  14/78/29 0737 11/18/12 0600  NA 137 134*  K 4.0 3.3*  CL 103 102  CO2 24 22  GLUCOSE 97 124*  BUN 13 8  CREATININE 0.68 0.65  CALCIUM 9.3 9.4   Liver Function Tests:  Recent Labs  11/16/12 0009  AST 17  ALT 10  ALKPHOS 77  BILITOT 0.2*  PROT 8.2  ALBUMIN 3.4*   No results found for this basename: LIPASE, AMYLASE,  in the last 72 hours CBC:  Recent Labs  11/17/12 0824 11/18/12 0600  WBC 11.7* 12.1*  HGB 8.8* 8.4*  HCT 30.9* 29.8*  MCV 60.9* 61.1*  PLT 653* 608*   Cardiac Enzymes:  Recent Labs  11/16/12 0009  TROPONINI 1.51*   BNP: No components found with this basename: POCBNP,  D-Dimer: No results found for this basename: DDIMER,  in the last 72 hours Hemoglobin A1C: No results found for this basename: HGBA1C,  in the last 72 hours Fasting Lipid Panel: No results found for this basename: CHOL, HDL, LDLCALC, TRIG, CHOLHDL, LDLDIRECT,  in the last 72  hours Thyroid Function Tests: No results found for this basename: TSH, T4TOTAL, FREET3, T3FREE, THYROIDAB,  in the last 72 hours Anemia Panel: No results found for this basename: VITAMINB12, FOLATE, FERRITIN, TIBC, IRON, RETICCTPCT,  in the last 72 hours  RADIOLOGY: Dg Chest 2 View  11/14/2012   *RADIOLOGY REPORT*  Clinical Data: Chest pain  CHEST - 2 VIEW  Comparison: None.  Findings: The lungs are well-aerated and free from pulmonary edema, focal airspace consolidation or pulmonary nodule.  Cardiac and mediastinal contours are within normal limits.  No pneumothorax, or pleural effusion. No acute osseous findings.  IMPRESSION:  No acute cardiopulmonary disease.   Original Report Authenticated By: Abigail Moan, M.D.   Dg Chest Portable 1 View  11/16/2012   *RADIOLOGY REPORT*  Clinical Data: Mid chest pain  PORTABLE CHEST - 1 VIEW  Comparison: 11/14/2012  Findings: Cardiac leads project over the chest.  Heart size is normal and stable.  Lung volumes are low.  There is peribronchial thickening bilaterally.  No focal consolidation, edema, or pleural effusion.  IMPRESSION: Mild peribronchial thickening bilaterally.  This can be seen in the setting of acute or chronic bronchitis, smoking, or asthma.  It may be accentuated by the patient's low lung volumes.   Original Report Authenticated By: Abigail Mccreedy, M.D.    PHYSICAL EXAM General: NAD Neck: No JVD, no thyromegaly or thyroid nodule.  Lungs: Clear to auscultation bilaterally with normal respiratory effort. CV: Nondisplaced PMI.  Heart regular S1/S2, no S3/S4, no murmur.  No peripheral edema.  No carotid bruit.  Normal pedal pulses.  Abdomen: Soft, nontender, no hepatosplenomegaly, no distention.  Neurologic: Alert and oriented x 3.  Psych: Normal affect. Extremities: No clubbing or cyanosis. Mild swelling at right wrist cath site.  2+ radial pulse on right.   TELEMETRY: Reviewed telemetry pt in NSR  ASSESSMENT AND PLAN: 42 yo with  history of RA presented with NSTEMI, found to have thrombus in mLAD with 80-90% stenosis.   1. NSTEMI: Thrombus mLAD.  EF preserved on echo with no wall motion abnormalities.  She is on integrilin gtt and heparin gtt and was also started on Brilinta.  Plan for relook angiography on Thursday to see if thrombus has resolved with the above treatment.  Given right wrist swelling post-cath, would use right groin.  She has noted dyspnea after taking Brilinta.  This is a possible Brilinta side effect.  Will continue for now but if this persists, would have her on Plavix rather than Brilinta (Effient if she needs a stent).  2. Anemia: Stable HCT post-transfusion.  Patient reports heavy periods.  She has Fe deficiency anemia.  CBC in am.   Abigail Garcia 11/18/2012 1:34 PM

## 2012-11-19 NOTE — Interval H&P Note (Signed)
History and Physical Interval Note:  11/19/2012 7:47 AM  Abigail Garcia  has presented today for surgery, with the diagnosis of cp  The various methods of treatment have been discussed with the patient and family. After consideration of risks, benefits and other options for treatment, the patient has consented to  Procedure(s): LEFT HEART CATHETERIZATION WITH CORONARY ANGIOGRAM (Bilateral) as a surgical intervention .  The patient's history has been reviewed, patient examined, no change in status, stable for surgery.  I have reviewed the patient's chart and labs.  Questions were answered to the patient's satisfaction.     Theron Arista [facility] 11/19/2012 7:47 AM

## 2012-11-19 NOTE — Progress Notes (Signed)
TR BAND REMOVAL  LOCATION:  right radial  DEFLATED PER PROTOCOL:  yes  TIME BAND OFF / DRESSING APPLIED:   1200   SITE UPON ARRIVAL:   Level 0  SITE AFTER BAND REMOVAL:  Level 0  REVERSE ALLEN'S TEST:    positive  CIRCULATION SENSATION AND MOVEMENT:  Within Normal Limits  yes  COMMENTS:     

## 2012-11-20 ENCOUNTER — Telehealth: Payer: Self-pay | Admitting: Cardiology

## 2012-11-20 DIAGNOSIS — D509 Iron deficiency anemia, unspecified: Secondary | ICD-10-CM | POA: Diagnosis not present

## 2012-11-20 DIAGNOSIS — I214 Non-ST elevation (NSTEMI) myocardial infarction: Secondary | ICD-10-CM | POA: Diagnosis not present

## 2012-11-20 LAB — BASIC METABOLIC PANEL
CO2: 22 mEq/L (ref 19–32)
Calcium: 9 mg/dL (ref 8.4–10.5)
Creatinine, Ser: 0.69 mg/dL (ref 0.50–1.10)
GFR calc non Af Amer: >90 mL/min (ref >90)
Glucose, Bld: 94 mg/dL (ref 70–99)
Sodium: 134 mEq/L — ABNORMAL LOW (ref 135–145)

## 2012-11-20 LAB — CBC
Hemoglobin: 8.8 g/dL — ABNORMAL LOW (ref 12.0–15.0)
MCH: 18 pg — ABNORMAL LOW (ref 26.0–34.0)
MCHC: 29.1 g/dL — ABNORMAL LOW (ref 30.0–36.0)
MCV: 61.9 fL — ABNORMAL LOW (ref 78.0–100.0)
Platelets: 549 10*3/uL — ABNORMAL HIGH (ref 150–400)

## 2012-11-20 MED ORDER — FERROUS SULFATE 325 (65 FE) MG PO TABS: 325.0000 mg | ORAL_TABLET | Freq: Three times a day (TID) | ORAL | Status: DC

## 2012-11-20 MED ORDER — ASPIRIN 81 MG PO TBEC: 81.0000 mg | DELAYED_RELEASE_TABLET | Freq: Every day | ORAL | Status: DC

## 2012-11-20 MED ORDER — ATORVASTATIN CALCIUM 80 MG PO TABS: 80.0000 mg | ORAL_TABLET | Freq: Every day | ORAL | Status: DC

## 2012-11-20 MED ORDER — TICAGRELOR 90 MG PO TABS: 90.0000 mg | ORAL_TABLET | Freq: Two times a day (BID) | ORAL | Status: DC

## 2012-11-20 MED ORDER — METOPROLOL TARTRATE 12.5 MG HALF TABLET: 12.5000 mg | ORAL_TABLET | Freq: Two times a day (BID) | ORAL | Status: DC

## 2012-11-20 NOTE — Discharge Summary (Signed)
Name: Abigail Garcia MRN: [id number] DOB: Feb 12, 1971 42 y.o. PCP: No Pcp Per Patient  Date of Admission: 11/15/2012 11:58 PM Date of Discharge: 11/20/2012 Attending Physician: Dr. Kem Kays  Discharge Diagnosis: Principal Problem:   NSTEMI (non-ST elevated myocardial infarction) Active Problems:   Rheumatoid arthritis   Anemia, iron deficiency  Discharge Medications:   Medication List    TAKE these medications       aspirin 81 MG EC tablet  Take 1 tablet (81 mg total) by mouth daily.     atorvastatin 80 MG tablet  Commonly known as:  LIPITOR  Take 1 tablet (80 mg total) by mouth daily at 6 PM.     ferrous sulfate 325 (65 FE) MG tablet  Take 1 tablet (325 mg total) by mouth 3 (three) times daily with meals.     metoprolol tartrate 12.5 mg Tabs  Commonly known as:  LOPRESSOR  Take 0.5 tablets (12.5 mg total) by mouth 2 (two) times daily.     Ticagrelor 90 MG Tabs tablet  Commonly known as:  BRILINTA  Take 1 tablet (90 mg total) by mouth 2 (two) times daily.        Disposition and follow-up:   Abigail Garcia was discharged from [facility] in Stable condition.  At the hospital follow up visit please address:  1.  If she has had any further chest pain, and address compliance with her medications.       She will need a work up for her iron def anemia, started Fe replacement this admission.      She needs outpatient care for her RA- she is not currently on medications for this but is stable  2.  Labs / imaging needed at time of follow-up: CBC  3.  Pending labs/ test needing follow-up: None  Follow-up Appointments: Follow-up Information   Follow up with [name], MD. (12/03/12 at 11:15am)    Contact information:   [address] [place] [address] [place] [address] [phone]       Follow up with Dede Query, MD On 11/27/2012. (At 10:30am)    Contact information:   [address] [place] [address] [phone]       Discharge  Instructions: Discharge Orders   Future Appointments Provider Department Dept Phone   11/27/2012 10:30 AM Dede Query, MD [facility] [phone]   12/03/2012 11:15 AM Laurey Morale, MD [facility] Main [facility] [place]) [phone]   Future Orders Complete By Expires     Amb Referral to Cardiac Rehabilitation  As directed     Call MD for:  difficulty breathing, headache or visual disturbances  As directed     Call MD for:  extreme fatigue  As directed     Call MD for:  persistant dizziness or light-headedness  As directed     Call MD for:  persistant nausea and vomiting  As directed     Call MD for:  severe uncontrolled pain  As directed     Call MD for:  temperature >100.4  As directed     Diet - low sodium heart healthy  As directed     Increase activity slowly  As directed        Consultations:  Cardiology  Procedures Performed:  Dg Chest 2 View  11/14/2012   *RADIOLOGY REPORT*  Clinical Data: Chest pain  CHEST - 2 VIEW  Comparison: None.  Findings: The lungs are well-aerated and free  from pulmonary edema, focal airspace consolidation or pulmonary nodule.  Cardiac and mediastinal contours are within normal limits.  No pneumothorax, or pleural effusion. No acute osseous findings.  IMPRESSION:  No acute cardiopulmonary disease.   Original Report Authenticated By: Malachy Moan, M.D.   Dg Chest Portable 1 View  11/16/2012   *RADIOLOGY REPORT*  Clinical Data: Mid chest pain  PORTABLE CHEST - 1 VIEW  Comparison: 11/14/2012  Findings: Cardiac leads project over the chest.  Heart size is normal and stable.  Lung volumes are low.  There is peribronchial thickening bilaterally.  No focal consolidation, edema, or pleural effusion.  IMPRESSION: Mild peribronchial thickening bilaterally.  This can be seen in the setting of acute or chronic bronchitis, smoking, or asthma.  It may be accentuated by the patient's low lung volumes.   Original Report Authenticated By: Britta Mccreedy, M.D.    2D Echo:  11/14/12: Study Conclusions  - Left ventricle: The cavity size was normal. Wall thickness was normal. Systolic function was normal. The estimated ejection fraction was in the range of 55% to 60%. Wall motion was normal; there were no regional wall motion abnormalities. Doppler parameters are consistent with abnormal left ventricular relaxation (grade 1 diastolic dysfunction). - Left atrium: The atrium was mildly dilated. - Atrial septum: There was an atrial septal aneurysm   Cardiac Cath: 11/16/12: Procedural Findings:  Hemodynamics:  AO 114/77 mean of 96 mm Hg  LV 118/6 mm Hg  Coronary angiography:  Coronary dominance: right  Left mainstem: Normal  Left anterior descending (LAD): There is a thrombus in the mid LAD following the takeoff of the first diagonal with eccentric 80-90% stenosis. The LAD is a large vessel and otherwise appears normal.  There is a very large Ramus intermediate branch that appears normal.  Left circumflex (LCx): Relatively small and normal.  Right coronary artery (RCA): Anterior takeoff, dominant vessel. Normal.  Left ventriculography: Left ventricular systolic function is normal, LVEF is estimated at 55-65%, there is no significant mitral regurgitation   Final Conclusions:  1. 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  2. Normal LV function.  Recommendations: Recommend aggressive antithrombotic and antiplatelet therapy for 48-72 hours. Check for coagulopathy. Recommend repeat angiography on Thursday 11/19/12   11/19/12: Procedural Details: The right wrist was prepped, draped, and anesthetized with 1% lidocaine. Using the modified Seldinger technique, a 6 Fr sheath was introduced into the radial artery. 3 mg verapamil was administered through the radial sheath. Weight-based heparin was given for anticoagulation. Angiography demonstrated persistent thrombotic lesion in the mid LAD. This was  focal. There was slight improvement compared to her prior study. However, it was still obstructive. Once a therapeutic ACT was achieved, a 6 [place] XB LAD 3.5 guide catheter was inserted. A pro-water coronary guidewire was used to cross the lesion. We aspirated the lesion with a Pronto aspiration catheter. Angiographically there was significant improvement in the lesion appearance. We then performed intravascular ultrasound. This demonstrated that the LAD was a large vessel up to 4 mm in diameter in the mid vessel. There was diffuse nonobstructive plaque in the LAD. At the site of the lesion there was residual thrombus. No dissection flap was noted. The mechanism was felt to be plaque erosion with thrombosis. The lesion was then dilated with a 4.0 mm balloon. This yielded an excellent angiographic result. Following PCI, there was 0% residual stenosis and TIMI-3 flow. The patient tolerated the procedure well. There were no immediate procedural complications.  A TR band was used for radial hemostasis. The patient was transferred to the post catheterization recovery area for further monitoring.  Lesion Data:  Vessel: LAD  Percent stenosis (pre): 80%  TIMI-flow (pre): 3  Aspiration thrombectomy and balloon angioplasty  Percent stenosis (post): 0%  TIMI-flow (post): 3  Conclusions:  Successful PCI of the mid LAD for a thrombotic lesion. This was successfully treated with aspiration thrombectomy and balloon angioplasty.  Recommendations: We'll continue IV Integrilin for 6 hours. Continue dual antiplatelet therapy for one year. Aggressive risk factor modification.   Admission Of note, this was her second presentation to the ED. The first presentation on was on 11/14/2012 at 7:29 AM. On the following day of 11/15/2012 she left AMA at 10 am before complete workup and treatment management for NSTEMI. She currently denies any symptoms during the interim periods she had been home. No chest pain no, no SOB. She  feels fine.   History of Present Illness (on first presentation on 11/14/2012): Patient is a 42 year old woman with PMH significant for rheumatoid arthritis not currently on any medications who present had earlier presented to the ED with complaints of chest pain. The patient states that she had an episode of midsternal chest pain 2 days prior to admission which she describes as feeling "sore" that was moderate/severe on onset, however resolved slowly over 48 hours. She states she had a subsequent episode of similar chest pain on the morning of admission that was slightly more severe than her previous episode and did not resolve, which prompted her to call EMS. She states the chest pain radiated through her chest to her back and also down her left arm. She states the pain is somewhat improved after receiving nitroglycerin in the ED. She denies any shortness of breath, diaphoresis, nausea, or lightheadedness with either episode of chest pain. She denies leg swelling. She denies any recent change in her activity level or recent immobilization. Denies any recent travel.  Review of Systems:  Review of Systems  Constitutional: Negative for fever, chills and malaise/fatigue.  HENT: Negative.  Eyes: Negative.  Respiratory: Negative for cough and hemoptysis.  Cardiovascular: Initial chest completely resolved. Negative for palpitations, orthopnea and leg swelling.  Gastrointestinal: Negative for nausea, vomiting, abdominal pain, blood in stool and melena.  Genitourinary: Negative for dysuria and hematuria.  Musculoskeletal: Negative for myalgias and joint pain.  Skin: Negative.  Neurological: No focal neurologic symptoms. Negative for dizziness.  Endo/Heme/Allergies: Negative.  Psychiatric/Behavioral: Negative.  Physical Exam:  Blood pressure 130/84, pulse 88, temperature 97.8 F (36.6 C), temperature source Oral, resp. rate 18, height 5\' 2"  (1.575 m), weight 246 lb 7.6 oz (111.8 kg), last menstrual period  11/03/2012, SpO2 100.00%.  Physical Exam  Constitutional: She is oriented to person, place, and time and well-developed, well-nourished, and in no distress. No distress.  HENT:  Head: Normocephalic and atraumatic.  Eyes: Conjunctivae are normal. Pupils are equal, round, and reactive to light. No scleral icterus.  Neck: Normal range of motion. Neck supple. No tracheal deviation present.  Cardiovascular: Normal rate and regular rhythm.  No murmur heard.  Pulmonary/Chest: Effort normal. She has no wheezes. She has no rales.  Abdominal: Soft. Bowel sounds are normal. She exhibits no distension. There is no tenderness.  Musculoskeletal: Normal range of motion. She exhibits no edema and no tenderness.  Neurological: She is alert and oriented to person, place, and time. No cranial nerve deficit.  Skin: Skin is warm and dry. Tattooed skin. She is not diaphoretic.  No erythema.  Psychiatric: Affect and judgment normal.    Hospital Course by problem list: Pt is a 42 year old woman with PMH significant for rheumatoid arthritis not currently on any medications is s/p left cardiac cath 2/2 NSTEMI.   NSTEMI: Troponin 1.65 on initial hospital admission, peaking to 2.84 this admission and now down to 1.51. EKG w/o ST elevation however T wave inversion and Q waves are present in lead III this admission. Pt remains hemodynamically stable. ASA, BB, Lipitor, and heparin drip were all restarted this admission. [facility] has been following her and performed a LHC on 6/16 which revealed a thrombus in the mid LAD with eccentric 80-90% stenosis. In addition to the heparin, she was started on aggressive antithrombotic and antiplatelet therapy, and a hypercoagulability panel was also ordered, unfortunately the heparin drip was already started, so will need to repeat in 6 months if still indicated. Repeat LHC on 6/19 with thrombus extraction and PTCA. She remains chest pain free. Per Cardiology, pt stable for d/c  home on ASA 81 and Brilinta with f/u in 2 weeks with Dr. Shirlee Latch.  - ASA 81mg  daily and Brilinta 90mg  po BID  - Lopressor 12.5mg  po BID  - Lipitor 80mg  QD  - NTG PRN  - F/u with Cardiology in 2 wks as outpatient   Iron Deficiency Anemia: History of heavy menses. Hemoglobin 7.6 on initial admission and corrected to 9.2 after one 1 unit of PRBC in the setting of NSTEMI. MCV ~ 60. Anemia panel is classic for iron deficiency anemia: UIBC 452, ferritin of 1. She denies any melena or bloody stools. Denies any history of sickle cell disease/trait or thalassemia. She states she's had one blood transfusion in the past for this issue when she was pregnant approximately 9 years ago. She has not been on any recent iron supplementation, so started iron replacement TID with meals this admission. Hgb stable today at 8.8. She is to f/u with [facility] on 11/27/12. Recent Labs  Lab  11/16/12 0737  11/17/12 0824  11/18/12 0600  11/19/12 0603  11/20/12 0540   HGB  8.5*  8.8*  8.4*  8.5*  8.8*    - Iron supplementation TID w/ meals  - She will need outpatient w/o for the etiology of her anemia  Rheumatoid arthritis: Patient reports a history of rheumatoid arthritis. She states she was last seen by a rheumatologist approximately one year ago, at which time she was prescribed prednisone however she states she has not taken this medication or any other DMARD's for approximately one year. She denies any recent symptoms/flares associated with her RA.  - She will need outpatient f/u with Rheumatology    Discharge Vitals:   BP 108/57  Pulse 92  Temp(Src) 97.7 F (36.5 C) (Oral)  Resp 18  Ht 5\' 2"  (1.575 m)  Wt 246 lb 14.6 oz (112 kg)  BMI 45.15 kg/m2  SpO2 98%  LMP 11/03/2012  Discharge Labs:  Results for orders placed during the hospital encounter of 11/15/12 (from the past 24 hour(s))  CBC     Status: Abnormal   Collection Time    11/20/12  5:40 AM      Result Value Range   WBC 11.8 (*) 4.0 - 10.5 K/uL    RBC 4.88  3.87 - 5.11 MIL/uL   Hemoglobin 8.8 (*) 12.0 - 15.0 g/dL   HCT 16.1 (*) 09.6 - 04.5 %   MCV 61.9 (*) 78.0 - 100.0 fL   MCH 18.0 (*) 26.0 -  34.0 pg   MCHC 29.1 (*) 30.0 - 36.0 g/dL   RDW 84.6 (*) 96.2 - 95.2 %   Platelets 549 (*) 150 - 400 K/uL  BASIC METABOLIC PANEL     Status: Abnormal   Collection Time    11/20/12  5:40 AM      Result Value Range   Sodium 134 (*) 135 - 145 mEq/L   Potassium 3.8  3.5 - 5.1 mEq/L   Chloride 103  96 - 112 mEq/L   CO2 22  19 - 32 mEq/L   Glucose, Bld 94  70 - 99 mg/dL   BUN 8  6 - 23 mg/dL   Creatinine, Ser 8.41  0.50 - 1.10 mg/dL   Calcium 9.0  8.4 - 32.4 mg/dL   GFR calc non Af Amer >90  >90 mL/min   GFR calc Af Amer >90  >90 mL/min    Signed: Genelle Gather, MD 11/20/2012, 2:19 PM   Time Spent on Discharge: 35 minutes Services Ordered on Discharge: None Equipment Ordered on Discharge: None

## 2012-11-20 NOTE — Progress Notes (Signed)
11/20/12 1344 Noted CM referral for Brilinta.  Pt. has 30 day free Brilinta card.  Benefits check completed, and Brilinta is covered with Medicaid and pt. will pay $3 co-pay for medication.  Spoke with pt. and gave her this information.  Tera Mater, RN, BSN NCM [phone]

## 2012-11-20 NOTE — Progress Notes (Addendum)
Subjective: S/p repeat LHC with thrombus extraction and PTCA. Pt doing well overall. She continues to deny any chest pain or SOB.  Objective: Vital signs in last 24 hours: Filed Vitals:   11/19/12 2103 11/20/12 0036 11/20/12 0458 11/20/12 0847  BP: 107/67 108/54 109/71 108/57  Pulse: 87 80 81 92  Temp: 98.4 F (36.9 C) 98.2 F (36.8 C) 97.9 F (36.6 C) 97.7 F (36.5 C)  TempSrc: Oral Oral Oral Oral  Resp: 16 18 20 18   Height:      Weight:  246 lb 14.6 oz (112 kg)    SpO2: 99% 100% 98% 98%   Weight change: 1 lb 8.7 oz (0.7 kg)  Intake/Output Summary (Last 24 hours) at 11/20/12 0929 Last data filed at 11/20/12 0600  Gross per 24 hour  Intake 1584.5 ml  Output   2900 ml  Net -1315.5 ml   Vitals reviewed. General: Sitting up in bed, NAD HEENT: PERRL, EOMI, no scleral icterus Cardiac: RRR, no rubs, murmurs or gallops Pulm: Clear to auscultation bilaterally, no wheezes, rales, or rhonchi Abd: Soft, nontender, nondistended, BS present Ext: Warm and well perfused, no pedal edema, 2+ radial and DP pulses, wrist w/o significant swelling Neuro: Alert and oriented X3, cranial nerves II-XII grossly intact, strength and sensation to light touch equal in bilateral upper and lower extremities  Lab Results: Basic Metabolic Panel:  Recent Labs Lab 11/19/12 0603 11/20/12 0540  NA 137 134*  K 4.0 3.8  CL 103 103  CO2 23 22  GLUCOSE 99 94  BUN 9 8  CREATININE 0.66 0.69  CALCIUM 9.7 9.0   Liver Function Tests:  Recent Labs Lab 11/14/12 0806 11/16/12 0009  AST 16 17  ALT 9 10  ALKPHOS 66 77  BILITOT 0.2* 0.2*  PROT 7.5 8.2  ALBUMIN 3.0* 3.4*   CBC:  Recent Labs Lab 11/14/12 0806  11/19/12 0603 11/20/12 0540  WBC 9.9  < > 12.8* 11.8*  NEUTROABS 5.6  --   --   --   HGB 7.6*  < > 8.5* 8.8*  HCT 27.3*  < > 29.5* 30.2*  MCV 59.5*  < > 61.5* 61.9*  PLT 689*  < > 597* 549*  < > = values in this interval not displayed. Cardiac Enzymes:  Recent Labs Lab  11/14/12 2344 11/15/12 0540 11/16/12 0009  TROPONINI 2.84* 1.58* 1.51*   BNP:  Recent Labs Lab 11/14/12 1302 11/14/12 1430  PROBNP 108.1 111.4   Hemoglobin A1C:  Recent Labs Lab 11/14/12 1135  HGBA1C 5.7*   Fasting Lipid Panel:  Recent Labs Lab 11/14/12 2344  CHOL 211*  HDL 44  LDLCALC 143*  TRIG 118  CHOLHDL 4.8   Thyroid Function Tests:  Recent Labs Lab 11/14/12 1113  TSH 1.181   Coagulation:  Recent Labs Lab 11/14/12 1430 11/16/12 0737  LABPROT 14.4 13.7  INR 1.14 1.06   Anemia Panel:  Recent Labs Lab 11/14/12 1113  VITAMINB12 599  FOLATE 19.0  FERRITIN 1*  TIBC Not calculated due to Iron <10.  IRON <10*  RETICCTPCT 1.9   Urine Drug Screen: Drugs of Abuse     Component Value Date/Time   LABOPIA NONE DETECTED 11/14/2012 1142   COCAINSCRNUR NONE DETECTED 11/14/2012 1142   LABBENZ NONE DETECTED 11/14/2012 1142   AMPHETMU NONE DETECTED 11/14/2012 1142   THCU NONE DETECTED 11/14/2012 1142   LABBARB NONE DETECTED 11/14/2012 1142     Micro Results: Recent Results (from the past 240 hour(s))  MRSA PCR  SCREENING     Status: None   Collection Time    11/14/12  1:33 PM      Result Value Range Status   MRSA by PCR NEGATIVE  NEGATIVE Final   Comment:            The GeneXpert MRSA Assay (FDA     approved for NASAL specimens     only), is one component of a     comprehensive MRSA colonization     surveillance program. It is not     intended to diagnose MRSA     infection nor to guide or     monitor treatment for     MRSA infections.   Studies/Results: No results found. Medications: I have reviewed the patient's current medications. Scheduled Meds: . aspirin EC  81 mg Oral Daily  . atorvastatin  80 mg Oral q1800  . ferrous sulfate  325 mg Oral TID WC  . metoprolol tartrate  12.5 mg Oral BID  . sodium chloride  3 mL Intravenous Q12H  . Ticagrelor  90 mg Oral BID   Continuous Infusions:   PRN Meds:.acetaminophen, acetaminophen,  morphine injection  Assessment/Plan: Pt is a 42 year old woman with PMH significant for rheumatoid arthritis not currently on any medications is s/p left cardiac cath 2/2 NSTEMI.  NSTEMI: Troponin 1.65 on initial hospital admission, peaking to 2.84 this admission and now down to 1.51. EKG w/o ST elevation however T wave inversion and Q waves are present in lead III this admission. Pt remains hemodynamically stable. ASA, BB, Lipitor, and heparin drip were all restarted this admission. [facility] has been following her and performed a LHC on 6/16 which revealed a thrombus in the mid LAD with eccentric 80-90% stenosis. In addition to the heparin, she was started on aggressive antithrombotic and antiplatelet therapy, and a hypercoagulability panel was also ordered, unfortunately the heparin drip was already started, so will need to repeat in 6 months if still indicated. Repeat LHC on 6/19 with thrombus extraction and PTCA. She remains chest pain free.  Per Cardiology, pt stable for d/c home on ASA 81 and Brilinta with f/u in 2 weeks with Dr. Shirlee Latch. - ASA 81mg  daily and Brilinta 90mg  po BID - Lopressor 12.5mg  po BID - Lipitor 80mg  QD  - NTG PRN  - F/u with Cardiology in 2 wks as outpatient  Iron Deficiency Anemia: History of heavy menses. Hemoglobin 7.6 on initial admission and corrected to 9.2 after one 1 unit of PRBC in the setting of NSTEMI. MCV ~ 60. Anemia panel is classic for iron deficiency anemia: UIBC 452, ferritin of 1. She denies any melena or bloody stools. Denies any history of sickle cell disease/trait or thalassemia. She states she's had one blood transfusion in the past for this issue when she was pregnant approximately 9 years ago. She has not been on any recent iron supplementation, so started iron replacement TID with meals this admission. Hgb stable today at 8.8.  Recent Labs Lab 11/16/12 0737 11/17/12 0824 11/18/12 0600 11/19/12 0603 11/20/12 0540  HGB 8.5* 8.8* 8.4*  8.5* 8.8*   - Iron supplementation TID w/ meals - She will need outpatient w/o for the etiology of her anemia   Rheumatoid arthritis: Patient reports a history of rheumatoid arthritis. She states she was last seen by a rheumatologist approximately one year ago, at which time she was prescribed prednisone however she states she has not taken this medication or any other DMARD's for approximately one year. She  denies any recent symptoms/flares associated with her RA.  - She will need outpatient f/u with Rheumatology  Dispo: D/c to home today with Cardiology f/u in 2 weeks. She will follow up with [facility].  The patient does not have a current PCP (No PCP Per Patient), therefore will possibly be requiring OPC follow-up after discharge.   The patient does not have transportation limitations that hinder transportation to clinic appointments.  .Services Needed at time of discharge: Y = Yes, Blank = No PT:   OT:   RN:   Equipment:   Other:     LOS: 5 days   Genelle Gather 11/20/2012, 9:29 AM

## 2012-11-20 NOTE — Progress Notes (Signed)
F/u appt at [facility] Dr. Shirlee Latch 12/03/12 at 11:15am. Discharge instructions should be: No driving for 1 week. No lifting over 10 lbs for 2 weeks. No sexual activity for 2 weeks. Keep procedure site clean & dry. If you notice increased pain, swelling, bleeding or pus, call/return!  You may shower, but no soaking baths/hot tubs/pools for 1 week.  Pts usually are allowed to go back to work in 1 week for NSTEMI, unless there were complications present. Dayna Dunn PA-C

## 2012-11-20 NOTE — Progress Notes (Signed)
TELEMETRY: Reviewed telemetry pt in NSR: Filed Vitals:   11/19/12 1603 11/19/12 2103 11/20/12 0036 11/20/12 0458  BP: 109/66 107/67 108/54 109/71  Pulse: 80 87 80 81  Temp: 98.3 F (36.8 C) 98.4 F (36.9 C) 98.2 F (36.8 C) 97.9 F (36.6 C)  TempSrc: Oral Oral Oral Oral  Resp: 20 16 18 20   Height:      Weight:   246 lb 14.6 oz (112 kg)   SpO2: 99% 99% 100% 98%    Intake/Output Summary (Last 24 hours) at 11/20/12 0824 Last data filed at 11/20/12 0600  Gross per 24 hour  Intake 1584.5 ml  Output   2900 ml  Net -1315.5 ml    SUBJECTIVE Feels great. No chest pain. Minimal dyspnea after taking Brilinta.  LABS: Basic Metabolic Panel:  Recent Labs  19/14/78 0603 11/20/12 0540  NA 137 134*  K 4.0 3.8  CL 103 103  CO2 23 22  GLUCOSE 99 94  BUN 9 8  CREATININE 0.66 0.69  CALCIUM 9.7 9.0   Liver Function Tests: No results found for this basename: AST, ALT, ALKPHOS, BILITOT, PROT, ALBUMIN,  in the last 72 hours No results found for this basename: LIPASE, AMYLASE,  in the last 72 hours CBC:  Recent Labs  11/19/12 0603 11/20/12 0540  WBC 12.8* 11.8*  HGB 8.5* 8.8*  HCT 29.5* 30.2*  MCV 61.5* 61.9*  PLT 597* 549*    Radiology/Studies:  Dg Chest 2 View  11/14/2012   *RADIOLOGY REPORT*  Clinical Data: Chest pain  CHEST - 2 VIEW  Comparison: None.  Findings: The lungs are well-aerated and free from pulmonary edema, focal airspace consolidation or pulmonary nodule.  Cardiac and mediastinal contours are within normal limits.  No pneumothorax, or pleural effusion. No acute osseous findings.  IMPRESSION:  No acute cardiopulmonary disease.   Original Report Authenticated By: Malachy Moan, M.D.   Dg Chest Portable 1 View  11/16/2012   *RADIOLOGY REPORT*  Clinical Data: Mid chest pain  PORTABLE CHEST - 1 VIEW  Comparison: 11/14/2012  Findings: Cardiac leads project over the chest.  Heart size is normal and stable.  Lung volumes are low.  There is peribronchial  thickening bilaterally.  No focal consolidation, edema, or pleural effusion.  IMPRESSION: Mild peribronchial thickening bilaterally.  This can be seen in the setting of acute or chronic bronchitis, smoking, or asthma.  It may be accentuated by the patient's low lung volumes.   Original Report Authenticated By: Britta Mccreedy, M.D.    PHYSICAL EXAM General: Well developed, well nourished, in no acute distress. Head: Normal Neck: Negative for carotid bruits. JVD not elevated. Lungs: Clear bilaterally to auscultation without wheezes, rales, or rhonchi. Breathing is unlabored. Heart: RRR S1 S2 without murmurs, rubs, or gallops.  Abdomen: Soft, non-tender, non-distended with normoactive bowel sounds. No hepatomegaly. No rebound/guarding. No obvious abdominal masses. Msk:  Strength and tone appears normal for age. Extremities: No clubbing, cyanosis or edema.  Distal pedal pulses are 2+ and equal bilaterally. Right wrist without significant hematoma.  Neuro: Alert and oriented X 3. Moves all extremities spontaneously. Psych:  Responds to questions appropriately with a normal affect.  ASSESSMENT AND PLAN: 1. NSTEMI with predominantly thrombotic lesion in mid LAD. Mechanism probably plaque erosion. 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 S/p thrombus extraction and PTCA. Continue ASA 81mg  and Brilinta. OK for discharge today. Follow up with Dr. Shirlee Latch in 2 weeks. 2. Fe def. Anemia. Hgb stable. 3. Hyperlipidemia. On statin. 4. RA  Principal Problem:   NSTEMI (non-ST elevated myocardial infarction) Active Problems:   Rheumatoid arthritis   Anemia, iron deficiency    Signed, Peter [place] MD,FACC 11/20/2012 8:28 AM

## 2012-11-20 NOTE — Progress Notes (Signed)
CARDIAC REHAB PHASE I   PRE:  Rate/Rhythm: 88SR  BP:  Supine: 123/66  Sitting:   Standing:    SaO2:   MODE:  Ambulation: 1000 ft   POST:  Rate/Rhythm: 120ST  BP:  Supine:   Sitting: 109/74  Standing:    SaO2:  0900-1000 Pt walked 1000 ft with steady gait. Tolerated well. No CP. Education completed. Discussed CRP 2. Pt gave permission to refer to [facility] Phase 2.   Luetta Nutting, RN BSN  11/20/2012 9:57 AM

## 2012-11-20 NOTE — Discharge Summary (Signed)
  Date: 11/20/2012  Patient name: Abigail Garcia  Medical record number: [id number]  Date of birth: [birth year]   This patient has been seen and the plan of care was discussed with the house staff. Please see their note for complete details. I concur with their findings and plan.   Jonah Blue, DO 11/20/2012, 3:49 PM

## 2012-11-20 NOTE — Telephone Encounter (Signed)
New problem   Pt has 14 day TCM  W/Dr Shirlee Latch 12/03/12 per [facility] calling.

## 2012-11-23 NOTE — Telephone Encounter (Signed)
Patient contacted regarding discharge from [facility] on 6/21.  Patient understands to follow up with provider Dr. Shirlee Latch on 7/3 at 11:15 at [facility]. Patient understands discharge instructions? Yes Patient understands medications and regiment? Yes - patient states she cannot afford Rx for iron at this time - will buy OTC supplement when able Patient understands to bring all medications to this visit? Yes  Patient states she is doing well since discharge; denies chest discomfort or SOB.  Patient advised to call us if she has questions or concerns prior to her next appointment.

## 2012-11-23 NOTE — Telephone Encounter (Signed)
TCM; LMTCB home and mobile

## 2012-11-27 ENCOUNTER — Ambulatory Visit (INDEPENDENT_AMBULATORY_CARE_PROVIDER_SITE_OTHER): Payer: Medicaid Other | Admitting: Internal Medicine

## 2012-11-27 VITALS — BP 107/74 | HR 87 | Temp 96.9°F | Ht 63.0 in | Wt 246.8 lb

## 2012-11-27 DIAGNOSIS — I214 Non-ST elevation (NSTEMI) myocardial infarction: Secondary | ICD-10-CM

## 2012-11-27 DIAGNOSIS — D509 Iron deficiency anemia, unspecified: Secondary | ICD-10-CM

## 2012-11-27 DIAGNOSIS — M069 Rheumatoid arthritis, unspecified: Secondary | ICD-10-CM

## 2012-11-27 LAB — CBC
HCT: 30.2 % — ABNORMAL LOW (ref 36.0–46.0)
Hemoglobin: 8.8 g/dL — ABNORMAL LOW (ref 12.0–15.0)
MCH: 17.8 pg — ABNORMAL LOW (ref 26.0–34.0)
Platelets: 613 10*3/uL — ABNORMAL HIGH (ref 150–400)
RBC: 4.95 MIL/uL (ref 3.87–5.11)
WBC: 9.2 10*3/uL (ref 4.0–10.5)

## 2012-11-27 NOTE — Assessment & Plan Note (Addendum)
Patient reports a history of rheumatoid arthritis. She states that she was diagnosed by Dr. Marcie Bal at [facility] city 15 years ago. She reports, " I was on a lot of medications then". She states that she has not been on any medication for 10 years.  Last seen by a local rheumatologist Dr. Kellie Simmering one year ago. She denies any recent symptoms/flares associated with her RA.  - will need to send referral for rheumaology - however, her medicaid card does not have our clinic name on it and we are unable to send the referral.  We will work on fixing her medicaid card and send the referral.

## 2012-11-27 NOTE — Assessment & Plan Note (Addendum)
She states that she is doing well. Denies chest pain or chest pressure.  She reports medical compliance to ASA, Brilinta, Metoprolol and Lipitor.  - she is scheduled to see Dr. Shirlee Latch next week.

## 2012-11-27 NOTE — Assessment & Plan Note (Addendum)
Patient was found to have HGB 7.6 with MCV ~60 during the admission in June 2014. Anemia panel revealed iron deficiency anemia. She denies any melena or bloody stools. Denies any history of sickle cell disease/trait or thalassemia. She reports that she has always been anemic since she started her menstrual cycles. She states she's had one blood transfusion in the past for this issue when she was pregnant approximately 9 years ago. She has not been on any recent iron supplementation,   Her HH was 8.8/30.2 after one unit PRBC transfusion. She was discharged on OTC Ferrous sulfate 325 mg po TID with meals. She has not taken this pill because " I do not have money to buy it, and it cost $9.99".   - she states that she does not have a job and has no income except for child support. She states that she can get the the money in July and can afford the cost in 1-2 weeks.  - will give her a list of iron rich diet.  - will check her CBC today.  - will not perform pathology smear since she had recent blood transfusion.

## 2012-11-27 NOTE — Progress Notes (Signed)
Subjective:   Patient ID: Abigail Garcia female   DOB: 09/23/1970 42 y.o.   MRN: [id number]  Chief complaints: hospital follow up for NSTEMI and PCI HPI: [name] is a 42 y.o. woman with PMH of rheumatoid arthritis not currently on any medications who presents to clinic for hospital followup. This is her first visit to our clinic.  Patient was hospitalized for evaluation of NSTEMI between 6/15 and 11/20/2012.  She underwent a diagnostic cardiac cath on 11/16/12, and had PCI to the mid LAD by [facility]ology on 11/19/12.  Her discharge medications include aspirin 81 mg daily,  Lipitor 80 mg daily, metoprolol 12.5 BID, and Brilinta 90 mg BID. She states that she is doing well since her discharge. She reports medical compliance.   She states that she has mild SOB 10-15 minutes after taking Brilinta. Her SOB does not last long, and it only happens whenever she takes her Brilinta. She states that she told Dr. Shirlee Latch during the admission, and was told that it is one of side effect from Brilinta. She was told to continue brilinta.   Of note, a hypercoagulability panel was ordered during hospitalization, unfortunately heparin drip was already restarted, so we will need to repeat it in 6 months if indicated.   Past Medical History  Diagnosis Date  . RA (rheumatoid arthritis)    Current Outpatient Prescriptions  Medication Sig Dispense Refill  . aspirin EC 81 MG EC tablet Take 1 tablet (81 mg total) by mouth daily.  30 tablet  11  . atorvastatin (LIPITOR) 80 MG tablet Take 1 tablet (80 mg total) by mouth daily at 6 PM.  30 tablet  11  . ferrous sulfate 325 (65 FE) MG tablet Take 1 tablet (325 mg total) by mouth 3 (three) times daily with meals.  90 tablet  11  . metoprolol tartrate (LOPRESSOR) 12.5 mg TABS Take 0.5 tablets (12.5 mg total) by mouth 2 (two) times daily.  60 tablet  11  . Ticagrelor (BRILINTA) 90 MG TABS tablet Take 1 tablet (90 mg total) by mouth 2 (two) times daily.  60 tablet  3   No  current facility-administered medications for this visit.   Family History  Problem Relation Age of Onset  . Heart disease      No cardiac disease that she can think of   History   Social History  . Marital Status: Single    Spouse Name: N/A    Number of Children: N/A  . Years of Education: N/A   Social History Main Topics  . Smoking status: Never Smoker   . Smokeless tobacco: Not on file  . Alcohol Use: No  . Drug Use: No  . Sexually Active: Not on file   Other Topics Concern  . Not on file   Social History Narrative  . No narrative on file   Review of Systems: Review of Systems:  Constitutional:  Denies fever, chills, diaphoresis, appetite change and fatigue.   HEENT:  Denies congestion, sore throat, rhinorrhea, sneezing, mouth sores, trouble swallowing, neck pain   Respiratory:  Denies SOB, DOE, cough, and wheezing.   Cardiovascular:  Denies palpitations and leg swelling.   Gastrointestinal:  Denies nausea, vomiting, abdominal pain, diarrhea, constipation, blood in stool and abdominal distention.   Genitourinary:  Denies dysuria, urgency, frequency, hematuria, flank pain and difficulty urinating.   Musculoskeletal:  Denies myalgias, back pain, joint swelling, arthralgias and gait problem.   Skin:  Denies pallor, rash and wound.  Neurological:  Denies dizziness, seizures, syncope, weakness, light-headedness, numbness and headaches.    .    Objective:  Physical Exam: Filed Vitals:   11/27/12 1123  BP: 107/74  Pulse: 87  Temp: 96.9 F (36.1 C)  TempSrc: Oral  Height: 5\' 3"  (1.6 m)  Weight: 246 lb 12.8 oz (111.948 kg)  SpO2: 100%   General: Central obesity, alert, well-developed, and cooperative to examination.  Head: normocephalic and atraumatic.  Eyes: vision grossly intact, pupils equal, pupils round, pupils reactive to light, no injection and anicteric.  Mouth: pharynx pink and moist, no erythema, and no exudates.  Neck: supple, full ROM, no  thyromegaly, no JVD, and no carotid bruits.  Lungs: normal respiratory effort, no accessory muscle use, normal breath sounds, no crackles, and no wheezes. Heart: normal rate, regular rhythm, no murmur, no gallop, and no rub.  Abdomen: soft, non-tender, normal bowel sounds, no distention, no guarding, no rebound tenderness, no hepatomegaly, and no splenomegaly.  Msk: no joint swelling, no joint warmth, and no redness over joints.  Pulses: 2+ DP/PT pulses bilaterally Extremities: No cyanosis, clubbing, edema. Right radial incision open to air and healing well. 2+ radial pulse. Neurologic: alert & oriented X3, cranial nerves II-XII intact, strength normal in all extremities, sensation intact to light touch, and gait normal.  Skin: turgor normal and no rashes.  Psych: Oriented X3, memory intact for recent and remote, normally interactive, good eye contact, not anxious appearing, and not depressed appearing.    Assessment & Plan:

## 2012-11-27 NOTE — Patient Instructions (Addendum)
1. Will check your CBC today 2. Will send the referral to rheumatology.  3. Please fill your iron supplement in 1 week when you are able to pay for it.  4. Follow up in 1-2 months.    Iron-Rich Diet An iron-rich diet contains foods that are good sources of iron. Iron is an important mineral that helps your body produce hemoglobin. Hemoglobin is a protein in red blood cells that carries oxygen to the body's tissues. Sometimes, the iron level in your blood can be low. This may be caused by:  A lack of iron in your diet.  Blood loss.  Times of growth, such as during pregnancy or during a child's growth and development. Low levels of iron can cause a decrease in the number of red blood cells. This can result in iron deficiency anemia. Iron deficiency anemia symptoms include:  Tiredness.  Weakness.  Irritability.  Increased chance of infection.  Here are some recommendations for daily iron intake: .  Women ages 36 to 62 need 18 mg of iron per day.  Women over the age of 5 need 8 mg of iron per day.  SOURCES OF IRON There are 2 types of iron that are found in food: heme iron and nonheme iron. Heme iron is absorbed by the body better than nonheme iron. Heme iron is found in meat, poultry, and fish. Nonheme iron is found in grains, beans, and vegetables.  Very Good (Heme) Iron Sources Food / Iron (mg)  Chicken liver, 3 oz (85 g)/ 10 mg  Beef liver, 3 oz (85 g)/ 5.5 mg  Oysters, 3 oz (85 g)/ 8 mg  Beef, 3 oz (85 g)/ 2 to 3 mg  Shrimp, 3 oz (85 g)/ 2.8 mg  [place], 3 oz (85 g)/ 2 mg  Chicken, 3 oz (85 g) / 1 mg  Fish (tuna, halibut), 3 oz (85 g)/ 1 mg  Pork, 3 oz (85 g)/ 0.9 mg  Good (Nonheme) Iron Sources Food / Iron (mg)  Ready-to-eat breakfast cereal, iron-fortified / 3.9 to 7 mg  Tofu,  cup / 3.4 mg  Kidney beans,  cup / 2.6 mg  Baked potato with skin / 2.7 mg  Asparagus,  cup / 2.2 mg  Avocado / 2 mg  Dried peaches,  cup / 1.6 mg  Raisins,  cup  / 1.5 mg  Soy milk, 1 cup / 1.5 mg  Whole-wheat bread, 1 slice / 1.2 mg  Spinach, 1 cup / 0.8 mg  Broccoli,  cup / 0.6 mg  IRON ABSORPTION Certain foods can decrease the body's absorption of iron. Try to avoid these foods and beverages while eating meals with iron-containing foods:  Coffee.  Tea.  Fiber.  Soy. Foods containing vitamin C can help increase the amount of iron your body absorbs from iron sources, especially from nonheme sources. Eat foods with vitamin C along with iron-containing foods to increase your iron absorption. Foods that are high in vitamin C include many fruits and vegetables. Some good sources are:  Fresh orange juice.  Oranges.  Strawberries.  Mangoes.  Grapefruit.  Red bell peppers.  Green bell peppers.  Broccoli.  Potatoes with skin.  Tomato juice.

## 2012-11-28 NOTE — Progress Notes (Signed)
Case discussed with Dr. Li soon after the resident saw the patient. We reviewed the resident's history and exam and pertinent patient test results. I agree with the assessment, diagnosis, and plan of care documented in the resident's note. 

## 2012-11-29 ENCOUNTER — Emergency Department (HOSPITAL_COMMUNITY): Payer: Medicaid Other

## 2012-11-29 ENCOUNTER — Encounter (HOSPITAL_COMMUNITY): Payer: Self-pay | Admitting: *Deleted

## 2012-11-29 ENCOUNTER — Observation Stay (HOSPITAL_COMMUNITY)
Admission: EM | Admit: 2012-11-29 | Discharge: 2012-12-01 | Disposition: A | Discharge status: Home / Self Care | Payer: Medicaid Other | Attending: Internal Medicine | Admitting: Internal Medicine

## 2012-11-29 ENCOUNTER — Other Ambulatory Visit: Payer: Self-pay

## 2012-11-29 DIAGNOSIS — M069 Rheumatoid arthritis, unspecified: Secondary | ICD-10-CM | POA: Diagnosis present

## 2012-11-29 DIAGNOSIS — R0602 Shortness of breath: Secondary | ICD-10-CM | POA: Insufficient documentation

## 2012-11-29 DIAGNOSIS — T50905A Adverse effect of unspecified drugs, medicaments and biological substances, initial encounter: Secondary | ICD-10-CM

## 2012-11-29 DIAGNOSIS — R079 Chest pain, unspecified: Principal | ICD-10-CM | POA: Insufficient documentation

## 2012-11-29 DIAGNOSIS — T4595XA Adverse effect of unspecified primarily systemic and hematological agent, initial encounter: Secondary | ICD-10-CM | POA: Insufficient documentation

## 2012-11-29 DIAGNOSIS — I2510 Atherosclerotic heart disease of native coronary artery without angina pectoris: Secondary | ICD-10-CM

## 2012-11-29 DIAGNOSIS — Z79899 Other long term (current) drug therapy: Secondary | ICD-10-CM | POA: Insufficient documentation

## 2012-11-29 DIAGNOSIS — Z91199 Patient's noncompliance with other medical treatment and regimen due to unspecified reason: Secondary | ICD-10-CM | POA: Insufficient documentation

## 2012-11-29 DIAGNOSIS — T50995A Adverse effect of other drugs, medicaments and biological substances, initial encounter: Secondary | ICD-10-CM | POA: Insufficient documentation

## 2012-11-29 DIAGNOSIS — Z7902 Long term (current) use of antithrombotics/antiplatelets: Secondary | ICD-10-CM | POA: Insufficient documentation

## 2012-11-29 DIAGNOSIS — D509 Iron deficiency anemia, unspecified: Secondary | ICD-10-CM | POA: Insufficient documentation

## 2012-11-29 DIAGNOSIS — I252 Old myocardial infarction: Secondary | ICD-10-CM | POA: Insufficient documentation

## 2012-11-29 DIAGNOSIS — Z9119 Patient's noncompliance with other medical treatment and regimen: Secondary | ICD-10-CM | POA: Insufficient documentation

## 2012-11-29 DIAGNOSIS — Z9861 Coronary angioplasty status: Secondary | ICD-10-CM | POA: Insufficient documentation

## 2012-11-29 DIAGNOSIS — M0579 Rheumatoid arthritis with rheumatoid factor of multiple sites without organ or systems involvement: Secondary | ICD-10-CM | POA: Diagnosis present

## 2012-11-29 HISTORY — DX: Atherosclerotic heart disease of native coronary artery without angina pectoris: I25.10

## 2012-11-29 HISTORY — DX: Anemia, unspecified: D64.9

## 2012-11-29 HISTORY — DX: Non-ST elevation (NSTEMI) myocardial infarction: I21.4

## 2012-11-29 LAB — CBC
HCT: 29.4 % — ABNORMAL LOW (ref 36.0–46.0)
Hemoglobin: 8.6 g/dL — ABNORMAL LOW (ref 12.0–15.0)
MCH: 18.4 pg — ABNORMAL LOW (ref 26.0–34.0)
MCV: 63 fL — ABNORMAL LOW (ref 78.0–100.0)
RBC: 4.67 MIL/uL (ref 3.87–5.11)
WBC: 13 10*3/uL — ABNORMAL HIGH (ref 4.0–10.5)

## 2012-11-29 MED ORDER — ASPIRIN 81 MG PO CHEW
324.0000 mg | CHEWABLE_TABLET | Freq: Once | ORAL | Status: AC
  Administered 2012-11-30: 324 mg via ORAL
  Filled 2012-11-29: qty 4

## 2012-11-29 MED ORDER — NITROGLYCERIN 0.4 MG SL SUBL: 0.4000 mg | SUBLINGUAL_TABLET | SUBLINGUAL | Status: DC | PRN

## 2012-11-29 NOTE — ED Notes (Signed)
Recent admission last week. "felt fine/normal at d/c". Describes chest discomfort as mid chest tightness, intermitent. denies pain, also mentions "have been belching", (denies: nvd, fever, sob, cough, dizziness, cold sx, congestion, abd pain or other sx), reports tightness returned when her period started (describes as heavy bleeding), hgb noted to be low 8.6, c/w recent hgb (usually low), pt alert, NAD, calm, interactive, resps e/u, speaking in clear complete sentences.

## 2012-11-29 NOTE — ED Provider Notes (Signed)
History    CSN: [id number] Arrival date & time 11/29/12  2241  First MD Initiated Contact with Patient 11/29/12 2344     Chief Complaint  Patient presents with  . Chest Pain   (Consider location/radiation/quality/duration/timing/severity/associated sxs/prior Treatment) HPI Hx per PT - CP onset this am substernal, pressure like, not radiating, no SOB, feels the same as when she was diagnosed with NSTEMI less than a month ago.  This feels very similar - she had cath and angioplasty at that time for 80% stenosis LAD. Followed by Dierdre Harness. No leg pain or swelling, no F/C, no recent travel.  Past Medical History  Diagnosis Date  . RA (rheumatoid arthritis)   . NSTEMI (non-ST elevated myocardial infarction)    Past Surgical History  Procedure Laterality Date  . Cesarean section     Family History  Problem Relation Age of Onset  . Heart disease      No cardiac disease that she can think of   History  Substance Use Topics  . Smoking status: Never Smoker   . Smokeless tobacco: Not on file  . Alcohol Use: No   OB History   Grav Para Term Preterm Abortions TAB SAB Ect Mult Living                 Review of Systems  Constitutional: Negative for fever and chills.  HENT: Negative for neck pain and neck stiffness.   Eyes: Negative for pain.  Respiratory: Negative for shortness of breath.   Cardiovascular: Positive for chest pain.  Gastrointestinal: Negative for abdominal pain.  Genitourinary: Negative for dysuria.  Musculoskeletal: Negative for back pain.  Skin: Negative for rash.  Neurological: Negative for headaches.  All other systems reviewed and are negative.    Allergies  Review of patient's allergies indicates no known allergies.  Home Medications   Current Outpatient Rx  Name  Route  Sig  Dispense  Refill  . aspirin EC 81 MG EC tablet   Oral   Take 1 tablet (81 mg total) by mouth daily.   30 tablet   11   . atorvastatin (LIPITOR) 80 MG tablet   Oral   Take 1 tablet (80 mg total) by mouth daily at 6 PM.   30 tablet   11   . ferrous sulfate 325 (65 FE) MG tablet   Oral   Take 1 tablet (325 mg total) by mouth 3 (three) times daily with meals.   90 tablet   11   . metoprolol tartrate (LOPRESSOR) 12.5 mg TABS   Oral   Take 0.5 tablets (12.5 mg total) by mouth 2 (two) times daily.   60 tablet   11   . Ticagrelor (BRILINTA) 90 MG TABS tablet   Oral   Take 1 tablet (90 mg total) by mouth 2 (two) times daily.   60 tablet   3    BP 104/68  Pulse 85  Temp(Src) 98.1 F (36.7 C) (Oral)  Resp 18  SpO2 100%  LMP 11/25/2012 Physical Exam  Constitutional: She is oriented to person, place, and time. She appears well-developed and well-nourished.  HENT:  Head: Normocephalic and atraumatic.  Eyes: EOM are normal. Pupils are equal, round, and reactive to light.  Neck: Neck supple.  Cardiovascular: Normal rate, regular rhythm and intact distal pulses.   Pulmonary/Chest: Effort normal and breath sounds normal. No respiratory distress. She exhibits no tenderness.  Abdominal: Soft. She exhibits no distension. There is no tenderness.  Musculoskeletal: Normal  range of motion. She exhibits no edema.  Neurological: She is alert and oriented to person, place, and time.  Skin: Skin is warm and dry.    ED Course  Procedures (including critical care time)  Results for orders placed during the hospital encounter of 11/29/12  CBC      Result Value Range   WBC 13.0 (*) 4.0 - 10.5 K/uL   RBC 4.67  3.87 - 5.11 MIL/uL   Hemoglobin 8.6 (*) 12.0 - 15.0 g/dL   HCT 29.5 (*) 62.1 - 30.8 %   MCV 63.0 (*) 78.0 - 100.0 fL   MCH 18.4 (*) 26.0 - 34.0 pg   MCHC 29.3 (*) 30.0 - 36.0 g/dL   RDW 65.7 (*) 84.6 - 96.2 %   Platelets 587 (*) 150 - 400 K/uL  BASIC METABOLIC PANEL      Result Value Range   Sodium 134 (*) 135 - 145 mEq/L   Potassium 3.5  3.5 - 5.1 mEq/L   Chloride 102  96 - 112 mEq/L   CO2 25  19 - 32 mEq/L   Glucose, Bld 109 (*) 70 - 99  mg/dL   BUN 14  6 - 23 mg/dL   Creatinine, Ser 9.52  0.50 - 1.10 mg/dL   Calcium 9.3  8.4 - 84.1 mg/dL   GFR calc non Af Amer 84 (*) >90 mL/min   GFR calc Af Amer >90  >90 mL/min  POCT I-STAT TROPONIN I      Result Value Range   Troponin i, poc 0.00  0.00 - 0.08 ng/mL   Comment 3            Dg Chest 2 View  11/29/2012   *RADIOLOGY REPORT*  Clinical Data: Chest pain and pressure.  CHEST - 2 VIEW  Comparison: Single view of the chest 11/16/2012.  Findings: Lungs are clear.  Heart size is normal.  No pneumothorax or pleural fluid.  IMPRESSION: Negative chest.   Original Report Authenticated By: Holley Dexter, M.D.     Date: 11/29/2012  Rate: 83  Rhythm: normal sinus rhythm  QRS Axis: normal  Intervals: normal  ST/T Wave abnormalities: nonspecific ST changes  Conduction Disutrbances:none  Narrative Interpretation:   Old EKG Reviewed: changes noted mild STE aVL no ST depressions  ASA PO  CP free in the ER  12:25 AM d/w CAR Dr Donnie Aho, will plan MED admit.   D/w teaching service - will admit  MDM  CP recent NSTEMI  ECG Labs CXR  Old records reviewed - 11/2012 had  'Successful PCI of the mid LAD for a thrombotic lesion. This was successfully treated with aspiration thrombectomy and balloon angioplastySunnie Nielsen, MD 11/30/12 [phone]

## 2012-11-29 NOTE — ED Notes (Signed)
Pt states intermittent substernal CP since this morning, weakness. Denies N/V, diaphoresis. Pain relived by nothing, pain exacerbated by nothing. Activity at time pain started, resting.

## 2012-11-30 ENCOUNTER — Encounter (HOSPITAL_COMMUNITY): Payer: Self-pay | Admitting: General Practice

## 2012-11-30 DIAGNOSIS — D509 Iron deficiency anemia, unspecified: Secondary | ICD-10-CM

## 2012-11-30 DIAGNOSIS — M069 Rheumatoid arthritis, unspecified: Secondary | ICD-10-CM

## 2012-11-30 DIAGNOSIS — Z9861 Coronary angioplasty status: Secondary | ICD-10-CM

## 2012-11-30 DIAGNOSIS — R079 Chest pain, unspecified: Secondary | ICD-10-CM

## 2012-11-30 DIAGNOSIS — I2510 Atherosclerotic heart disease of native coronary artery without angina pectoris: Secondary | ICD-10-CM

## 2012-11-30 LAB — PROTIME-INR
INR: 1.06 (ref 0.00–1.49)
Prothrombin Time: 13.6 seconds (ref 11.6–15.2)

## 2012-11-30 LAB — BASIC METABOLIC PANEL
CO2: 25 mEq/L (ref 19–32)
Calcium: 9.3 mg/dL (ref 8.4–10.5)
Chloride: 102 mEq/L (ref 96–112)
Creatinine, Ser: 0.85 mg/dL (ref 0.50–1.10)
Glucose, Bld: 109 mg/dL — ABNORMAL HIGH (ref 70–99)

## 2012-11-30 LAB — CBC
Hemoglobin: 7.7 g/dL — ABNORMAL LOW (ref 12.0–15.0)
Hemoglobin: 8 g/dL — ABNORMAL LOW (ref 12.0–15.0)
MCHC: 28.9 g/dL — ABNORMAL LOW (ref 30.0–36.0)
MCHC: 28.5 g/dL — ABNORMAL LOW (ref 30.0–36.0)
RBC: 4.22 MIL/uL (ref 3.87–5.11)

## 2012-11-30 LAB — TROPONIN I
Troponin I: <0.3 ng/mL (ref <0.30)
Troponin I: <0.3 ng/mL (ref <0.30)

## 2012-11-30 MED ORDER — CLOPIDOGREL BISULFATE 75 MG PO TABS
75.0000 mg | ORAL_TABLET | Freq: Every day | ORAL | Status: DC
  Administered 2012-11-30 – 2012-12-01 (×2): 75 mg via ORAL
  Filled 2012-11-30 (×2): qty 1

## 2012-11-30 MED ORDER — ATORVASTATIN CALCIUM 80 MG PO TABS
80.0000 mg | ORAL_TABLET | Freq: Every day | ORAL | Status: DC
  Administered 2012-11-30: 80 mg via ORAL
  Filled 2012-11-30 (×2): qty 1

## 2012-11-30 MED ORDER — SODIUM CHLORIDE 0.9 % IJ SOLN
3.0000 mL | INTRAMUSCULAR | Status: DC | PRN
  Administered 2012-11-30: 3 mL via INTRAVENOUS

## 2012-11-30 MED ORDER — ONDANSETRON HCL 4 MG/2ML IJ SOLN: 4.0000 mg | Freq: Four times a day (QID) | INTRAMUSCULAR | Status: DC | PRN

## 2012-11-30 MED ORDER — METOPROLOL TARTRATE 12.5 MG HALF TABLET
12.5000 mg | ORAL_TABLET | Freq: Two times a day (BID) | ORAL | Status: DC
  Administered 2012-11-30 – 2012-12-01 (×3): 12.5 mg via ORAL
  Filled 2012-11-30 (×4): qty 1

## 2012-11-30 MED ORDER — SODIUM CHLORIDE 0.9 % IV SOLN: 250.0000 mL | INTRAVENOUS | Status: DC | PRN

## 2012-11-30 MED ORDER — ENOXAPARIN SODIUM 40 MG/0.4ML ~~LOC~~ SOLN
40.0000 mg | SUBCUTANEOUS | Status: DC
  Administered 2012-11-30: 40 mg via SUBCUTANEOUS
  Filled 2012-11-30 (×2): qty 0.4

## 2012-11-30 MED ORDER — SODIUM CHLORIDE 0.9 % IJ SOLN
3.0000 mL | Freq: Two times a day (BID) | INTRAMUSCULAR | Status: DC
  Administered 2012-11-30: 3 mL via INTRAVENOUS

## 2012-11-30 MED ORDER — ASPIRIN EC 81 MG PO TBEC
81.0000 mg | DELAYED_RELEASE_TABLET | Freq: Every day | ORAL | Status: DC
  Administered 2012-11-30 – 2012-12-01 (×2): 81 mg via ORAL
  Filled 2012-11-30 (×2): qty 1

## 2012-11-30 MED ORDER — FERROUS SULFATE 325 (65 FE) MG PO TABS
325.0000 mg | ORAL_TABLET | Freq: Three times a day (TID) | ORAL | Status: DC
  Administered 2012-11-30 – 2012-12-01 (×5): 325 mg via ORAL
  Filled 2012-11-30 (×7): qty 1

## 2012-11-30 MED ORDER — ACETAMINOPHEN 325 MG PO TABS: 650.0000 mg | ORAL_TABLET | ORAL | Status: DC | PRN

## 2012-11-30 MED ORDER — TICAGRELOR 90 MG PO TABS
90.0000 mg | ORAL_TABLET | Freq: Two times a day (BID) | ORAL | Status: DC
  Administered 2012-11-30: 90 mg via ORAL
  Filled 2012-11-30 (×3): qty 1

## 2012-11-30 MED ORDER — NITROGLYCERIN 0.4 MG SL SUBL: 0.4000 mg | SUBLINGUAL_TABLET | SUBLINGUAL | Status: DC | PRN

## 2012-11-30 NOTE — H&P (Signed)
INTERNAL MEDICINE TEACHING SERVICE Attending Admission Note  Date: 11/30/2012  Patient name: Abigail Garcia  Medical record number: [id number]  Date of birth: 1970-07-06    I have seen and evaluated [name] and discussed their care with the Residency Team.  41 yr. Old AAF/ w/ hx of iron deficiency anemia, hx RA, recent admission due to NSTEMI s/p LHC with thrombus removal as well as on aggressive antiplatelet/antithrombotic tx, admitted for CP and SOB. She complained of SOB with initiation of Brilinta and this has not improved. She has no evidence of ACS. She admits to heavier menses on Brilinta.  No CP overnight. Consult cardiology for their opinion on switching to Plavix therapy. Her H/H is slightly lower, but I do not think she needs a transfusion. Needs further evaluation as outpatient.   Jonah Blue, DO 6/30/20141:15 PM

## 2012-11-30 NOTE — Progress Notes (Signed)
Subjective: No further chest pain. No SOB.  Objective: Vital signs in last 24 hours: Filed Vitals:   11/30/12 0136 11/30/12 0153 11/30/12 0500 11/30/12 0910  BP:  118/77 109/60 109/66  Pulse: 68 71 71 78  Temp:  97.5 F (36.4 C) 98.2 F (36.8 C)   TempSrc:  Oral    Resp: 18 18 18    Height:  5\' 3"  (1.6 m)    Weight:  245 lb (111.131 kg)    SpO2: 100% 100% 99%    Weight change:  No intake or output data in the 24 hours ending 11/30/12 1250 Vitals reviewed. General: resting in bed, NAD HEENT: PERRL, EOMI, no scleral icterus Cardiac: RRR, no rubs, murmurs or gallops Pulm: clear to auscultation bilaterally, no wheezes, rales, or rhonchi Abd: soft, nontender, nondistended, BS present Ext: warm and well perfused, no pedal edema Neuro: alert and oriented X3, cranial nerves II-XII grossly intact, strength and sensation to light touch equal in bilateral upper and lower extremities  Lab Results: Basic Metabolic Panel:  Recent Labs Lab 11/29/12 2249  NA 134*  K 3.5  CL 102  CO2 25  GLUCOSE 109*  BUN 14  CREATININE 0.85  CALCIUM 9.3   CBC:  Recent Labs Lab 11/29/12 2249 11/30/12 0450  WBC 13.0* 11.6*  HGB 8.6* 7.7*  HCT 29.4* 26.6*  MCV 63.0* 63.0*  PLT 587* 514*   Cardiac Enzymes:  Recent Labs Lab 11/30/12 0450  TROPONINI <0.30   Coagulation:  Recent Labs Lab 11/30/12 0450  LABPROT 13.6  INR 1.06   Urine Drug Screen: Drugs of Abuse     Component Value Date/Time   LABOPIA NONE DETECTED 11/14/2012 1142   COCAINSCRNUR NONE DETECTED 11/14/2012 1142   LABBENZ NONE DETECTED 11/14/2012 1142   AMPHETMU NONE DETECTED 11/14/2012 1142   THCU NONE DETECTED 11/14/2012 1142   LABBARB NONE DETECTED 11/14/2012 1142     Micro Results: No results found for this or any previous visit (from the past 240 hour(s)).  Studies/Results: Dg Chest 2 View  11/29/2012   *RADIOLOGY REPORT*  Clinical Data: Chest pain and pressure.  CHEST - 2 VIEW  Comparison: Single view of  the chest 11/16/2012.  Findings: Lungs are clear.  Heart size is normal.  No pneumothorax or pleural fluid.  IMPRESSION: Negative chest.   Original Report Authenticated By: Holley Dexter, M.D.   Medications: I have reviewed the patient's current medications. Scheduled Meds: . aspirin EC  81 mg Oral Daily  . atorvastatin  80 mg Oral q1800  . ferrous sulfate  325 mg Oral TID WC  . metoprolol tartrate  12.5 mg Oral BID  . sodium chloride  3 mL Intravenous Q12H  . Ticagrelor  90 mg Oral BID   Continuous Infusions:  PRN Meds:.sodium chloride, acetaminophen, nitroGLYCERIN, ondansetron (ZOFRAN) IV, sodium chloride  Assessment/Plan: 42 y.o. woman with PMH of RA, iron deficiency anemia and recent hospitalization for NSTEMI (6/15 and 11/20/2012). She had diagnostic LHC on 11/16/12 which revealed 80-90% thrombosis involving the mid LAD. She presents with chest pain.   # Chest pain: Likely related to the coronary thrombosis of the LAD. POC Troponin is negative. EKG is normal. Hgb is stable at 8.8, which makes demand ischemia unlikely. PE is unlikely with a Well's score of 0. A musculoskeletal etiology is not supported by physical examination. Clinical presentation is not consistent with a pneumonia, pneumothorax, GERD or aortic dissection. Chest x-ray is negative. Dr. Donnie Aho of Cardiology was consulted from the ED, Cards  to evaluate the patient today.  Troponin negative x1. Of note, pt states that she is having SOB for after Brilinta dose. Dr Shirlee Latch of [facility] cardiology had considered switching Brillinta to Plavix if SOB persists. Results from the PLATO trial reported dyspnea in >10% in patients with Brillinta  - Continue with aspirin, metoprolol - Holding 12pm Brillinta until seen by Cardiology - Continue Lipitor  - F/u with Cardiology, appreciate recommendations   # IDA: Previous admission with HGB 7.6 and MCV ~60. Anemia panel revealed iron deficiency anemia. She denies any melena or bloody stools.  Denies any history of sickle cell disease/trait or thalassemia. She reports that she has always been anemic since she started her menstrual cycles. She states she's had one blood transfusion in the past for this issue when she was pregnant approximately 9 years ago. She reports noncompliance with her iron supplementation due to cost. Hgb down to 7.7 from 8.6 on admission. Will need to repeat coagulation panel after 6 mo. The one performed on 6/16 is unreliable due to heparin.  - Restarting iron sulfate  - CBCs q 12 hours. Goal above 8   #Rheumatoid Arthritis: She states that she was diagnosed by Dr. Marcie Bal at [facility] [place] 15 years ago. She reports she was started on multiple medication but she has not been on any medication for 10 years. Last seen by a local rheumatologist Dr. Kellie Simmering one year ago. She denies any recent symptoms/flares associated with her RA. Needs outpatient f/u.  #DVT PPx: On ASA and Brilinta (held Brilinta at 12pm today), waiting for Cards recommendations.   Dispo: Disposition is deferred at this time, awaiting improvement of current medical problems.  Anticipated discharge in approximately 1-2 day(s).   The patient does not have a current PCP (No Pcp Per Patient) and does need an [facility] hospital follow-up appointment after discharge.  The patient does not have transportation limitations that hinder transportation to clinic appointments.  .Services Needed at time of discharge: Y = Yes, Blank = No PT:   OT:   RN:   Equipment:   Other:     LOS: 1 day   Genelle Gather, MD 11/30/2012, 12:50 PM

## 2012-11-30 NOTE — Consult Note (Signed)
CARDIOLOGY CONSULT NOTE  Patient ID: Abigail Garcia MRN: [id number], DOB/AGE: December 15, 1970   Admit date: 11/29/2012 Date of Consult: 11/30/2012  Primary Physician: No PCP Per Patient Primary Cardiologist: Golden Circle, MD  Pt. Profile  42 year old female with recent on ST segment elevation myocardial infarction status post thrombectomy and PTCA of the LAD who presents with recurrent chest pain.  Problem List  Past Medical History  Diagnosis Date  . RA (rheumatoid arthritis)   . CAD (coronary artery disease)     a. 11/2012 NSTEMI/Cath/PCI: LM nl, LAD 80-90 thrombotic (aspiration thrombectomy and PTCA), RI nl, LCX sm/nl, RCA nl, EF 55-65%.  . Anemia     Past Surgical History  Procedure Laterality Date  . Cesarean section    . Cardiac catheterization  2014     Allergies  No Known Allergies  HPI   42 year old female who was admitted June 14 with chest pain and found to have an elevated troponin. She briefly left the hospital to attend her child's graduation and then returned promptly for admission. She underwent diagnostic catheterization during that hospitalization which revealed a thrombotic occlusion of the LAD. She was subsequently treated with heparin and IIb IIIa inhibitor therapy and was taken back to the lab for aspiration thrombectomy and PTCA. She tolerated this procedure well was subsequently discharged on June 20. Of note, she had been experiencing dyspnea after doses of brilinta prior to discharge. Following discharge, she continued to experience dyspnea following brilinta doses. She was seen by primary care on June 27 and was doing well.  Last Wednesday, she developed her period and noted that her menstrual flow is slightly having than usual. On Saturday, she awoke with significant menstrual flow and began to experience intermittent brief 3-5 at 10 chest pressure occurring both at rest and with exertion. When she exerted, she also noted dyspnea. Symptoms typically last less than 1  minute and resolve spontaneously. Due to recurrence of symptoms, she presented to the ED yesterday where ECG was nonacute and troponins were negative. She was subsequently admitted and has now ruled out. She's had no recurrent chest pain since admission. We have been asked to evaluate.  Inpatient Medications  . aspirin EC  81 mg Oral Daily  . atorvastatin  80 mg Oral q1800  . ferrous sulfate  325 mg Oral TID WC  . metoprolol tartrate  12.5 mg Oral BID  . sodium chloride  3 mL Intravenous Q12H  . Ticagrelor  90 mg Oral BID   Family History Family History  Problem Relation Age of Onset  . Heart disease      Social History History   Social History  . Marital Status: Single    Spouse Name: N/A    Number of Children: N/A  . Years of Education: N/A   Occupational History  . Not on file.   Social History Main Topics  . Smoking status: Never Smoker   . Smokeless tobacco: Never Used  . Alcohol Use: No  . Drug Use: No  . Sexually Active: Not on file   Other Topics Concern  . Not on file   Social History Narrative  . No narrative on file    Review of Systems  General:  No chills, fever, night sweats or weight changes.  Cardiovascular:  +++ chest pain and dyspnea on exertion as outlined above.  No edema, orthopnea, palpitations, paroxysmal nocturnal dyspnea. Dermatological: No rash, lesions/masses Respiratory: No cough, dyspnea Urologic: No hematuria, dysuria Abdominal:   No nausea, vomiting,  diarrhea, bright red blood per rectum, melena, or hematemesis Neurologic:  No visual changes, wkns, changes in mental status. All other systems reviewed and are otherwise negative except as noted above.  Physical Exam  Blood pressure 119/77, pulse 86, temperature 98.2 F (36.8 C), temperature source Oral, resp. rate 18, height 5\' 3"  (1.6 m), weight 245 lb (111.131 kg), last menstrual period 11/25/2012, SpO2 97.00%.  General: Pleasant, NAD Psych: Normal affect. Neuro: Alert and  oriented X 3. Moves all extremities spontaneously. HEENT: Normal  Neck: Supple without bruits or JVD. Lungs:  Resp regular and unlabored, CTA. Heart: RRR no s3, s4, or murmurs. Abdomen: Soft, non-tender, non-distended, BS + x 4.  Extremities: No clubbing, cyanosis or edema. DP/PT/Radials 2+ and equal bilaterally.  Labs   Recent Labs  11/30/12 0450  TROPONINI <0.30   Lab Results  Component Value Date   WBC 8.8 11/30/2012   HGB 8.0* 11/30/2012   HCT 28.1* 11/30/2012   MCV 63.0* 11/30/2012   PLT 542* 11/30/2012    Recent Labs Lab 11/29/12 2249  NA 134*  K 3.5  CL 102  CO2 25  BUN 14  CREATININE 0.85  CALCIUM 9.3  GLUCOSE 109*   Lab Results  Component Value Date   CHOL 211* 11/14/2012   HDL 44 11/14/2012   LDLCALC 143* 11/14/2012   TRIG 118 11/14/2012   Radiology/Studies  Dg Chest 2 View  11/29/2012   *RADIOLOGY REPORT*  Clinical Data: Chest pain and pressure.  CHEST - 2 VIEW  Comparison: Single view of the chest 11/16/2012.  Findings: Lungs are clear.  Heart size is normal.  No pneumothorax or pleural fluid.  IMPRESSION: Negative chest.   Original Report Authenticated By: Holley Dexter, M.D.   ECG  Sinus rhythm, 83, T-wave inversion in lead 3 - no acute changes.  ASSESSMENT AND PLAN  1. Chest pain/coronary artery disease: Status post successful aspiration atherectomy and PTCA of the LAD earlier this month. She has been experiencing dyspnea following doses of brilinta since it was initiated. She's also had heavier than usual menstrual flow. We'll plan to switch her from brilinta to Plavix. She has not missed any doses of brilinta. Progress for chest pain, somewhat atypical and generally brief, lasting less than 1 minute and resolving spontaneously. Despite intermittent symptoms throughout the day yesterday, she has not had any acute ECG changes and her cardiac markers have been negative. Recommend ambulation this afternoon to reevaluate symptoms and consideration for  discharge if she continues to feel well. Continue her other home medications including aspirin, statin, and beta blocker.  2. Iron deficiency anemia: H&H was 7.7 and 26.6 on admission. She is now 8 and 21.1. This is within her usual range.  Signed, Nicolasa Ducking, NP 11/30/2012, 2:01 PM  Patient seen with NP, agree with the above note.  1. Dyspnea/chest pain: She has ruled out for recurrent MI, ECG is non-acute.  Her symptoms seem to peak soon after taking Brilinta.  I think this is most likely a Brilinta side effect.   - Stop Brilinta, start Plavix 75 mg daily today (has not missed any Brilinta doses).  2. Anemia: Heavier menses on Brilinta.  Has had chronic Fe-deficiency anemia from heavy menses.  Hopefully transitioning from Brilinta to Plavix will help this.   She probably could go home tonight or in am.   [name] 11/30/2012 4:12 PM

## 2012-11-30 NOTE — H&P (Signed)
Date: 11/30/2012               Patient Name:  Abigail Garcia MRN: [id number]  DOB: 09/05/1970 Age / Sex: 42 y.o., female   PCP: No Pcp Per Patient              Medical Service: Internal Medicine Teaching Service              Attending Physician: Dr. Jonah Blue, DO    First Contact: Dr. Sherrine Maples Pager: [phone]  Second Contact: Dr. Everardo Beals Pager: [phone]            After Hours (After 5p/  First Contact Pager: [phone]  weekends / holidays): Second Contact Pager: [phone]   Chief Complaint: chest pain for one day  History of Present Illness: [name] is a 42 y.o. woman with PMH of RA, iron deficiency anemia and recent hospitalization for NSTEMI (6/15 and 11/20/2012). She had diagnostic LHC on 11/16/12 which revealed 80-90% thrombosis involving the mid LAD. She was initiated on aggressive antithrombotic and antiplatelet therapy for 48-72 hours followed by thrombus aspiration, PTCA and IVUS on 6/19. She was discharged on Brilinta 90 mg BID, aspirin 81 mg daily, Lipitor 80 mg daily, and metoprolol 12.5 BID to which she reports compliance.    She presents today with chest pain for one day. The pain started on the morning of admission while she was in bed. It is a substernal chest discomfort, occuring every hour and lasting a few minutes. It intensifies to a level of 5/10 and resolves to 0. It does not radiate and she has not noticed any modifying factors. There is no positional or exertional variation. She was particularly concerned about this pain because she is currently having heavy menstrual flow for the past 3 days. She was also afraid that she is loosing all the blood which she received during her previous admission. She changes her pads every 2-3 hours. The pads are full with blood and some clots. This is unusual for her.   She denies diaphoresis, nausea, or vomiting. No history of cough, fevers or chills. Since she was discharged, she's been experiencing a few minutes of mild shortness  of breath after taking Brilinta. The SOB lasts 10-15 minutes and completely resolves. No DOE.  During her hospital followup visit on 6/28, her Hgb was found to be stable at 8.8. Of note, a hypercoagulability panel was ordered during hospitalization, unfortunately heparin drip was already restarted, so we will need to repeat it in 6 months if indicated.  Review of Systems: Constitutional: Denies fever, chills, diaphoresis, appetite change and fatigue.  HEENT: Denies photophobia, eye pain, redness, hearing loss, ear pain, congestion, sore throat, rhinorrhea, sneezing, mouth sores, trouble swallowing, neck pain, neck stiffness and tinnitus.  Gastrointestinal: Denies abdominal pain, diarrhea, constipation, blood in stool and abdominal distention.  Genitourinary: Denies dysuria, urgency, frequency, hematuria, flank pain and difficulty urinating.  Musculoskeletal: Denies myalgias, back pain, joint swelling, arthralgias and gait problem.  Skin: Denies pallor, rash and wound.  Neurological: Denies dizziness, seizures, syncope, weakness, lightheadedness, numbness and headaches.  Hematological: Denies adenopathy. Easy bruising, personal or family bleeding history  Psychiatric/Behavioral: Denies suicidal ideation, mood changes, confusion, nervousness, sleep disturbance and agitation  Meds  Current Outpatient Prescriptions  Medication Sig Dispense Refill  . aspirin EC 81 MG EC tablet Take 1 tablet (81 mg total) by mouth daily.  30 tablet  11  . atorvastatin (LIPITOR) 80 MG tablet Take 1 tablet (80 mg total) by mouth  daily at 6 PM.  30 tablet  11  . ferrous sulfate 325 (65 FE) MG tablet Take 1 tablet (325 mg total) by mouth 3 (three) times daily with meals.  90 tablet  11  . metoprolol tartrate (LOPRESSOR) 12.5 mg TABS Take 0.5 tablets (12.5 mg total) by mouth 2 (two) times daily.  60 tablet  11  . Ticagrelor (BRILINTA) 90 MG TABS tablet Take 1 tablet (90 mg total) by mouth 2 (two) times daily.  60 tablet   3    Allergies: Allergies as of 11/29/2012  . (No Known Allergies)   Past Medical History  Diagnosis Date  . RA (rheumatoid arthritis)   . NSTEMI (non-ST elevated myocardial infarction)    Past Surgical History  Procedure Laterality Date  . Cesarean section     Family History  Problem Relation Age of Onset  . Heart disease      No cardiac disease that she can think of   History   Social History  . Marital Status: Single    Spouse Name: N/A    Number of Children: N/A  . Years of Education: N/A   Occupational History  . Not on file.   Social History Main Topics  . Smoking status: Never Smoker   . Smokeless tobacco: Not on file  . Alcohol Use: No  . Drug Use: No  . Sexually Active: Not on file   Other Topics Concern  . Not on file   Social History Narrative  . No narrative on file      Physical Exam: Filed Vitals:   11/30/12 0136  BP:  108/70  Pulse: 68  Temp:  98.1   Resp: 18  General: well developed, well nourished; no acute distressed, cooperative with exam Head: atraumatic, normocephalic,  Eye: pupils equal, round and reactive; sclera anicteric; normal conjunctiva  Nose/throat: oropharynx clear, moist mucous membranes, pink gums  Lungs/Chest wall: clear to auscultation bilaterally, normal work of breathing  Heart: normal rate and regular rhythm; no murmurs Pulses: radial and dorsalis pedis pulses are 2+ and symmetric  Abdomen: Normal fullness, no rebound, guarding, or rigidity; normal bowel sounds; no masses or organomegaly  Skin: warm, dry, intact, normal turgor, no rashes  Extremities: no peripheral edema, clubbing, or cyanosis Neurologic: A&O X3, CN II - XII are grossly intact. Psych: Patient is alert and oriented, mood and affect are normal and congruent, thought content is normal without delusions, thought process is linear, speech is normal and non-pressured, behavior is normal  Lab results: Basic Metabolic Panel:  Recent Labs   11/29/12 2249  NA 134*  K 3.5  CL 102  CO2 25  GLUCOSE 109*  BUN 14  CREATININE 0.85  CALCIUM 9.3   CBC:  Recent Labs  11/29/12 2249  WBC 13.0*  HGB 8.6*  HCT 29.4*  MCV 63.0*  PLT 587*    Recent Labs Lab 11/27/12 1207 11/29/12 2249 11/30/12 0450  HGB 8.8* 8.6* 7.7*   HCT 30.2* 29.4* 26.6*        Recent Labs Lab 11/30/12 0450  TROPONINI <0.30    Imaging results:  Dg Chest 2 View  11/29/2012   *RADIOLOGY REPORT*  Clinical Data: Chest pain and pressure.  CHEST - 2 VIEW  Comparison: Single view of the chest 11/16/2012.  Findings: Lungs are clear.  Heart size is normal.  No pneumothorax or pleural fluid.  IMPRESSION: Negative chest.   Original Report Authenticated By: Holley Dexter, M.D.    Other results: EKG:  normal EKG, normal sinus rhythm.  Assessment & Plan by Problem:  42 y.o. woman with PMH of RA, iron deficiency anemia and recent hospitalization for NSTEMI (6/15 and 11/20/2012). She had diagnostic LHC on 11/16/12 which revealed 80-90% thrombosis involving the mid LAD. She presents with chest pain.  Principal Problem:   Chest pain Active Problems:   Rheumatoid arthritis   Anemia, iron deficiency   CAD S/P percutaneous coronary angioplasty # Chest pain: Likely related to the coronary thrombosis of the LAD. POC Troponin is negative. EKG is normal. Hgb is stable at 8.8, which makes demand ischemia unlikely. PE is unlikely with a Well's score of 0.  A musculoskeletal etiology is not supported by physical examination. Clinical presentation is not consistent with a pneumonia, pneumothorax, GERD or aortic dissection. Chest x-ray is negative. Dr. Donnie Aho of cardiology was consulted from the ED, and will evaluate the patient. Plan. - admit to telemetry under observation -Cycle cardiac enzymes X 2 -Continue with aspirin, metoprolol, Brillinta - continue with Lipitor - Dr Shirlee Latch of [facility] cardiology had considered switching Brillinta to Plavix if SOB persists.  Results from the PLATO trial reported dyspnea in >10% in patients with Brillinta  - Monitor Hemoglobin closely  - No heparin gtt needed at this time  - AM EKG - will repeat coagulation panel if needed. The one performed on 6/16 is unreliable due to heparin. - cardiology to evaluate patient  # IDA: Previous admission with HGB 7.6 and MCV ~60. Anemia panel revealed iron deficiency anemia. She denies any melena or bloody stools. Denies any history of sickle cell disease/trait or thalassemia. She reports that she has always been anemic since she started her menstrual cycles. She states she's had one blood transfusion in the past for this issue when she was pregnant approximately 9 years ago. She reports noncompliance with her iron supplementation due to cost  Plan  - restart iron sulfate  - monitor Hgb q 12 hours. Goal above 8   #Rheumatoid Arthritis: She states that she was diagnosed by Dr. Marcie Bal at [facility] 15 years ago. She reports she was started on multiple medication but she has not been on any medication for 10 years. Last seen by a local rheumatologist Dr. Kellie Simmering one year ago. She denies any recent symptoms/flares associated with her RA.   Dispo: Disposition is deferred at this time, awaiting improvement of current medical problems. Anticipated discharge in approximately 1-2 day(s).   The patient does not have a current PCP (No Pcp Per Patient), therefore will be require OPC follow-up after discharge.   The patient does not have transportation limitations that hinder transportation to clinic appointments.   Signed:  Dow Adolph, MD PGY-1 Internal Medicine Teaching Service Pager: [phone] (7pm-7am) 11/30/2012, 1:11 AM

## 2012-12-01 LAB — CBC
MCV: 63.5 fL — ABNORMAL LOW (ref 78.0–100.0)
Platelets: 537 10*3/uL — ABNORMAL HIGH (ref 150–400)
RBC: 4.38 MIL/uL (ref 3.87–5.11)
WBC: 9.7 10*3/uL (ref 4.0–10.5)

## 2012-12-01 LAB — BASIC METABOLIC PANEL
CO2: 25 mEq/L (ref 19–32)
Chloride: 106 mEq/L (ref 96–112)
Creatinine, Ser: 0.68 mg/dL (ref 0.50–1.10)
Potassium: 3.7 mEq/L (ref 3.5–5.1)
Sodium: 138 mEq/L (ref 135–145)

## 2012-12-01 MED ORDER — CLOPIDOGREL BISULFATE 75 MG PO TABS: 75.0000 mg | ORAL_TABLET | Freq: Every day | ORAL | Status: DC

## 2012-12-01 NOTE — Progress Notes (Signed)
   Subjective:  Denies CP or dyspnea   Objective:  Filed Vitals:   11/30/12 0910 11/30/12 1311 11/30/12 2139 12/01/12 0540  BP: 109/66 119/77 115/68 108/60  Pulse: 78 86 94 71  Temp:  98.2 F (36.8 C) 98.2 F (36.8 C) 97.8 F (36.6 C)  TempSrc:  Oral Oral Oral  Resp:  18 18 18   Height:      Weight:    246 lb 14.6 oz (112 kg)  SpO2:  97% 100% 98%    Intake/Output from previous day:  Intake/Output Summary (Last 24 hours) at 12/01/12 0934 Last data filed at 11/30/12 1700  Gross per 24 hour  Intake    840 ml  Output      0 ml  Net    840 ml    Physical Exam: Physical exam: Well-developed well-nourished in no acute distress.  Skin is warm and dry.  HEENT is normal.  Neck is supple. Chest is clear to auscultation with normal expansion.  Cardiovascular exam is regular rate and rhythm.  Abdominal exam nontender or distended. No masses palpated. Extremities show no edema. neuro grossly intact    Lab Results: Basic Metabolic Panel:  Recent Labs  29/56/21 2249 12/01/12 0530  NA 134* 138  K 3.5 3.7  CL 102 106  CO2 25 25  GLUCOSE 109* 96  BUN 14 12  CREATININE 0.85 0.68  CALCIUM 9.3 8.7   CBC:  Recent Labs  11/30/12 1255 12/01/12 0530  WBC 8.8 9.7  HGB 8.0* 7.9*  HCT 28.1* 27.8*  MCV 63.0* 63.5*  PLT 542* 537*   Cardiac Enzymes:  Recent Labs  11/30/12 0450 11/30/12 1258  TROPONINI <0.30 <0.30     Assessment/Plan:  1 chest pain-enzymes negative. As outlined in yesterday's note patient can be discharged and followup with Dr. Shirlee Latch on Thursday as scheduled. 2 coronary artery disease-continue aspirin and Plavix. Continue statin and lopressor. 3 iron deficiency anemia-management per primary care. 4 rheumatoid arthritis. Patient can be discharged from a cardiac standpoint. Olga Millers 12/01/2012, 9:34 AM

## 2012-12-01 NOTE — Discharge Summary (Signed)
Name: Abigail Garcia MRN: [id number] DOB: 06-13-1970 42 y.o. PCP: No Pcp Per Patient  Date of Admission: 11/29/2012 11:06 PM Date of Discharge: 12/01/2012 Attending Physician: Jonah Blue, DO  Discharge Diagnosis: Principal Problem:   Chest pain & SOB   Drug Reaction to Brilinta Active Problems:   Anemia, iron deficiency   Rheumatoid arthritis   CAD S/P percutaneous coronary angioplasty  Discharge Medications:   Medication List    STOP taking these medications       Ticagrelor 90 MG Tabs tablet  Commonly known as:  BRILINTA      TAKE these medications       aspirin 81 MG EC tablet  Take 1 tablet (81 mg total) by mouth daily.     atorvastatin 80 MG tablet  Commonly known as:  LIPITOR  Take 1 tablet (80 mg total) by mouth daily at 6 PM.     clopidogrel 75 MG tablet  Commonly known as:  PLAVIX  Take 1 tablet (75 mg total) by mouth daily with breakfast.     ferrous sulfate 325 (65 FE) MG tablet  Take 1 tablet (325 mg total) by mouth 3 (three) times daily with meals.     metoprolol tartrate 12.5 mg Tabs  Commonly known as:  LOPRESSOR  Take 0.5 tablets (12.5 mg total) by mouth 2 (two) times daily.        Disposition and follow-up:   [name] was discharged from [facility] in Good condition.  At the hospital follow up visit please address:  1.  Resolution of Chest pain 2.  Compliance with Plavix 3.  Repeat CBC to ensure she has rebounded after menses  Follow-up Appointments:     Follow-up Information   Follow up with [name], MD On 12/03/2012. (11:15am)    Contact information:   [address] [place] [address] [place] [address] [phone]       Follow up with Annett Gula, MD On 12/28/2012. (10:30a)    Contact information:   [address] [place] [address] [phone]       Discharge Instructions: Discharge Orders   Future Appointments Provider Department Dept Phone     12/03/2012 11:15 AM Laurey Morale, MD [facility] Main Office [place]) [phone]   12/28/2012 10:30 AM Annett Gula, MD [facility] INTERNAL MEDICINE [facility] [phone]   Future Orders Complete By Expires     Call MD for:  difficulty breathing, headache or visual disturbances  As directed     Call MD for:  extreme fatigue  As directed     Call MD for:  persistant dizziness or light-headedness  As directed     Call MD for:  persistant nausea and vomiting  As directed     Call MD for:  temperature >100.4  As directed     Diet - low sodium heart healthy  As directed     Discharge instructions  As directed     Comments:      Please stop taking brilinta and start taking plavix daily.  You have follow up appointments with Dr. [name] in cardiology and Dr. Desma Maxim [facility]. If you have any new or worsening symptoms, please call [phone]    Increase activity slowly  As directed        Consultations: Treatment Team:  Rounding [facility], MD  Procedures Performed:  Dg Chest 2 View 11/29/2012  Findings: Lungs are clear.  Heart size is normal.  No pneumothorax or pleural fluid.  IMPRESSION: Negative chest.      Admission HPI:  [name] is a 42 y.o. woman with PMH of RA, iron deficiency anemia and recent hospitalization for NSTEMI (6/15 and 11/20/2012). She had diagnostic LHC on 11/16/12 which revealed 80-90% thrombosis involving the mid LAD. She was initiated on aggressive antithrombotic and antiplatelet therapy for 48-72 hours followed by thrombus aspiration, PTCA and IVUS on 6/19. She was discharged on Brilinta 90 mg BID, aspirin 81 mg daily, Lipitor 80 mg daily, and metoprolol 12.5 BID to which she reports compliance.  She presents today with chest pain for one day. The pain started on the morning of admission while she was in bed. It is a substernal chest discomfort, occuring every hour and lasting a few minutes. It intensifies to a level of  5/10 and resolves to 0. It does not radiate and she has not noticed any modifying factors. There is no positional or exertional variation. She was particularly concerned about this pain because she is currently having heavy menstrual flow for the past 3 days. She was also afraid that she is loosing all the blood which she received during her previous admission. She changes her pads every 2-3 hours. The pads are full with blood and some clots. This is unusual for her.  She denies diaphoresis, nausea, or vomiting. No history of cough, fevers or chills. Since she was discharged, she's been experiencing a few minutes of mild shortness of breath after taking Brilinta. The SOB lasts 10-15 minutes and completely resolves. No DOE.  During her hospital followup visit on 6/28, her Hgb was found to be stable at 8.8. Of note, a hypercoagulability panel was ordered during hospitalization, unfortunately heparin drip was already restarted, so we will need to repeat it in 6 months if indicated.  Physical Exam:  Filed Vitals:    11/30/12 0136   BP:  108/70   Pulse:  68   Temp:  98.1   Resp:  18   General: well developed, well nourished; no acute distressed, cooperative with exam  Head: atraumatic, normocephalic,  Eye: pupils equal, round and reactive; sclera anicteric; normal conjunctiva  Nose/throat: oropharynx clear, moist mucous membranes, pink gums  Lungs/Chest wall: clear to auscultation bilaterally, normal work of breathing  Heart: normal rate and regular rhythm; no murmurs  Pulses: radial and dorsalis pedis pulses are 2+ and symmetric  Abdomen: Normal fullness, no rebound, guarding, or rigidity; normal bowel sounds; no masses or organomegaly  Skin: warm, dry, intact, normal turgor, no rashes  Extremities: no peripheral edema, clubbing, or cyanosis  Neurologic: A&O X3, CN II - XII are grossly intact.  Psych: Patient is alert and oriented, mood and affect are normal and congruent, thought content is normal  without delusions, thought process is linear, speech is normal and non-pressured, behavior is normal  Lab results:  Basic Metabolic Panel:   Recent Labs   11/29/12 2249   NA  134*   K  3.5   CL  102   CO2  25   GLUCOSE  109*   BUN  14   CREATININE  0.85   CALCIUM  9.3    CBC:  Recent Labs   11/29/12 2249   WBC  13.0*   HGB  8.6*   HCT  29.4*   MCV  63.0*   PLT  587*   Recent Labs  Lab  11/27/12 1207  11/29/12  2249  11/30/12 0450   HGB  8.8*  8.6*  7.7*    HCT  30.2*  29.4*  26.6*    Recent Labs  Lab  11/30/12 0450   TROPONINI  <0.30      Hospital Course by problem list: #Chest Pain and SOB: Thought to be secondary to known side effect of brilinta (which was started at last hospitalization for NSTEMI, during which she underwent thrombus extraction in the mid LAD with eccentric 80-0% stenosis).  She was ruled out for recurrent MI.  Symptoms likely compounded by worsening anemia in setting of menses.  Symptoms improved after brilinta d/c and plavix started (she was monitored overnight after medication change).    #Iron Deficiency Anemia: Acute on chronic in the setting of active menses.  While Hb dropped to 7.7, she did not require transfusion.  She was continued on TID iron with meals.  #H/o Rheumatoid arthritis: not active hospital problem.  She is not actively taking prednisone or other DMARD therapy times 1 year and denies s/s of active RA.   Discharge Vitals:   BP 108/60  Pulse 71  Temp(Src) 97.8 F (36.6 C) (Oral)  Resp 18  Ht 5\' 3"  (1.6 m)  Wt 246 lb 14.6 oz (112 kg)  BMI 43.75 kg/m2  SpO2 98%  LMP 11/25/2012  Discharge Labs:  Results for orders placed during the hospital encounter of 11/29/12 (from the past 24 hour(s))  CBC     Status: Abnormal   Collection Time    11/30/12 12:55 PM      Result Value Range   WBC 8.8  4.0 - 10.5 K/uL   RBC 4.46  3.87 - 5.11 MIL/uL   Hemoglobin 8.0 (*) 12.0 - 15.0 g/dL   HCT 16.1 (*) 09.6 - 04.5 %   MCV 63.0  (*) 78.0 - 100.0 fL   MCH 17.9 (*) 26.0 - 34.0 pg   MCHC 28.5 (*) 30.0 - 36.0 g/dL   RDW 40.9 (*) 81.1 - 91.4 %   Platelets 542 (*) 150 - 400 K/uL  TROPONIN I     Status: None   Collection Time    11/30/12 12:58 PM      Result Value Range   Troponin I <0.30  <0.30 ng/mL  CBC     Status: Abnormal   Collection Time    12/01/12  5:30 AM      Result Value Range   WBC 9.7  4.0 - 10.5 K/uL   RBC 4.38  3.87 - 5.11 MIL/uL   Hemoglobin 7.9 (*) 12.0 - 15.0 g/dL   HCT 78.2 (*) 95.6 - 21.3 %   MCV 63.5 (*) 78.0 - 100.0 fL   MCH 18.0 (*) 26.0 - 34.0 pg   MCHC 28.4 (*) 30.0 - 36.0 g/dL   RDW 08.6 (*) 57.8 - 46.9 %   Platelets 537 (*) 150 - 400 K/uL  BASIC METABOLIC PANEL     Status: None   Collection Time    12/01/12  5:30 AM      Result Value Range   Sodium 138  135 - 145 mEq/L   Potassium 3.7  3.5 - 5.1 mEq/L   Chloride 106  96 - 112 mEq/L   CO2 25  19 - 32 mEq/L   Glucose, Bld 96  70 - 99 mg/dL   BUN 12  6 - 23 mg/dL   Creatinine, Ser 6.29  0.50 - 1.10 mg/dL   Calcium 8.7  8.4 - 52.8 mg/dL  GFR calc non Af Amer >90  >90 mL/min   GFR calc Af Amer >90  >90 mL/min    Signed: Belia Heman, MD 12/01/2012, 12:16 PM   Time Spent on Discharge: 35 minutes

## 2012-12-01 NOTE — Progress Notes (Signed)
  Date: 12/01/2012  Patient name: Abigail Garcia  Medical record number: [id number]  Date of birth: 17-Aug-1970   This patient has been seen and the plan of care was discussed with the house staff. Please see their note for complete details. I concur with their findings with the following additions/corrections:  No CP, no SOB. Cardiology ok with D/C home from their standpoint. H/H stable. Will need to continue Iron three times daily. Outpatient follow up to workup further. Likely menstrual loss, but need to investigate GI source. Con plavix as outpatient. Medically stable, D/C home today.  Jonah Blue, DO 12/01/2012, 2:27 PM

## 2012-12-02 DIAGNOSIS — T50905A Adverse effect of unspecified drugs, medicaments and biological substances, initial encounter: Secondary | ICD-10-CM

## 2012-12-03 ENCOUNTER — Ambulatory Visit (INDEPENDENT_AMBULATORY_CARE_PROVIDER_SITE_OTHER): Payer: Medicaid Other | Admitting: Cardiology

## 2012-12-03 ENCOUNTER — Encounter: Payer: Self-pay | Admitting: Cardiology

## 2012-12-03 VITALS — BP 114/68 | HR 81 | Ht 63.0 in | Wt 245.0 lb

## 2012-12-03 DIAGNOSIS — Z9861 Coronary angioplasty status: Secondary | ICD-10-CM

## 2012-12-03 DIAGNOSIS — I214 Non-ST elevation (NSTEMI) myocardial infarction: Secondary | ICD-10-CM

## 2012-12-03 DIAGNOSIS — D509 Iron deficiency anemia, unspecified: Secondary | ICD-10-CM

## 2012-12-03 DIAGNOSIS — E785 Hyperlipidemia, unspecified: Secondary | ICD-10-CM

## 2012-12-03 DIAGNOSIS — M069 Rheumatoid arthritis, unspecified: Secondary | ICD-10-CM

## 2012-12-03 DIAGNOSIS — I2510 Atherosclerotic heart disease of native coronary artery without angina pectoris: Secondary | ICD-10-CM

## 2012-12-03 MED ORDER — RAMIPRIL 2.5 MG PO CAPS: 2.5000 mg | ORAL_CAPSULE | Freq: Every day | ORAL | Status: DC

## 2012-12-03 NOTE — Progress Notes (Signed)
Patient ID: Abigail Garcia, female   DOB: 20-Jan-1971, 42 y.o.   MRN: [id number] PCP: Dr. Dierdre Searles  42 yo with history of rheumatoid arthritis presented in 6/14 with chest pain and was found to have NSTEMI.  LHC showed thrombus in the mid LAD.  Patient was put on heparin, Brilinta, and Integrilin gtts for 2 days and cath was repeated, showing persistence of thrombus. Aspiration thrombectomy and PTCA were then done.  The patient was initially placed on Brilinta but developed significant dyspnea so this was stopped and Plavix was begun.    Since the switch to Plavix, dyspnea has resolved.  No further chest pain.  Feels good overall.   Labs (6/14): ESR 49, lupus anticoagulant negative, anticardiolipin antibody negative, factor V Leiden negative, Protein C and S levels normal. Labs (7/14): K 3.7, creatinine 0.68, HCT 27.8  PMH: 1. Rheumatoid arthritis: not currently on DMARDs 2. Fe deficiency anemia thought to be related to heavy menses.  3. Hyperlipidemia 4. CAD: NSTEMI 6/14 with LHC showing thrombus in the mLAD with 80-90% stenosis.   Patient was treated with heparin and integrilin gtts for 2 days then repeat LHC was done.  This showed the clot was still present.  The patient then underwent aspiration thrombectomy and PTCA.  Patient had dyspnea with Brilinta.  Echo (6/14) with EF 55-60%.   5. Obesity  SH: Lives in [place], nonsmoker, currently unemployed, has children.   FH: No CAD that she knows of.  ROS: All systems reviewed and negative except as per HPI.   Current Outpatient Prescriptions  Medication Sig Dispense Refill  . aspirin EC 81 MG EC tablet Take 1 tablet (81 mg total) by mouth daily.  30 tablet  11  . atorvastatin (LIPITOR) 80 MG tablet Take 1 tablet (80 mg total) by mouth daily at 6 PM.  30 tablet  11  . clopidogrel (PLAVIX) 75 MG tablet Take 1 tablet (75 mg total) by mouth daily with breakfast.  30 tablet  3  . ferrous sulfate 325 (65 FE) MG tablet Take 1 tablet (325 mg total) by mouth  3 (three) times daily with meals.  90 tablet  11  . metoprolol tartrate (LOPRESSOR) 12.5 mg TABS Take 0.5 tablets (12.5 mg total) by mouth 2 (two) times daily.  60 tablet  11  . ramipril (ALTACE) 2.5 MG capsule Take 1 capsule (2.5 mg total) by mouth daily.  30 capsule  6   No current facility-administered medications for this visit.    BP 114/68  Pulse 81  Ht 5\' 3"  (1.6 m)  Wt 245 lb (111.131 kg)  BMI 43.41 kg/m2  LMP 11/25/2012 General: Obese, NAD Neck: No JVD, no thyromegaly or thyroid nodule.  Lungs: Clear to auscultation bilaterally with normal respiratory effort. CV: Nondisplaced PMI.  Heart regular S1/S2, no S3/S4, no murmur.  No peripheral edema.  No carotid bruit.  Normal pedal pulses.  Abdomen: Soft, nontender, no hepatosplenomegaly, no distention.  Skin: Intact without lesions or rashes.  Neurologic: Alert and oriented x 3.  Psych: Normal affect. Extremities: No clubbing or cyanosis.   Assessment/Plan 1. CAD: NSTEMI in 6/14 with thrombus noted in mid LAD.  Status post aspiration thrombectomy and PTCA.  She is only 42 and has no family history of CAD.  Rheumatoid arthritis may have predisposed her to this MI via the inflammatory milieu it creates.  Dyspnea has resolved with switch from Brilinta to Plavix.  - Continue Plavix x 1 year.  - Continue ASA 81, statin, and  metoprolol - Add ramipril 2.5 mg daily with BMET in 2 wks for secondary prevention.  - Start cardiac rehab.  2. Hyperlipidemia: Check lipids/LFTs in 9/14.  3. Anemia: Probably related to heavy menses.  She will ask her PCP about seeing a gynecologist.    [name] 12/03/2012

## 2012-12-03 NOTE — Patient Instructions (Addendum)
Start ramipril (altace) 2.5mg  daily.   Your physician recommends that you return for lab work in: 2 weeks--BMET.    You have been referred to Cardiac Rehab at [facility].    Your physician recommends that you return for a FASTING lipid profile /liver profile in September 2014.   Your physician wants you to follow-up in: 3 months with Dr Shirlee Latch. (October 2014).  You will receive a reminder letter in the mail two months in advance. If you don't receive a letter, please call our office to schedule the follow-up appointment.

## 2012-12-03 NOTE — Discharge Summary (Signed)
  Date: 12/03/2012  Patient name: Abigail Garcia  Medical record number: [id number]  Date of birth: 03-24-1971   This patient has been seen and the plan of care was discussed with the house staff. Please see their note for complete details. I concur with their findings and plan.  Jonah Blue, DO 12/03/2012, 12:27 PM

## 2012-12-17 ENCOUNTER — Other Ambulatory Visit: Payer: Medicaid Other

## 2012-12-28 ENCOUNTER — Ambulatory Visit (INDEPENDENT_AMBULATORY_CARE_PROVIDER_SITE_OTHER): Payer: Medicaid Other | Admitting: Internal Medicine

## 2012-12-28 ENCOUNTER — Encounter: Payer: Self-pay | Admitting: Internal Medicine

## 2012-12-28 VITALS — BP 121/78 | HR 93 | Temp 97.0°F | Ht 63.25 in | Wt 249.6 lb

## 2012-12-28 DIAGNOSIS — I214 Non-ST elevation (NSTEMI) myocardial infarction: Secondary | ICD-10-CM

## 2012-12-28 DIAGNOSIS — E785 Hyperlipidemia, unspecified: Secondary | ICD-10-CM

## 2012-12-28 DIAGNOSIS — M069 Rheumatoid arthritis, unspecified: Secondary | ICD-10-CM

## 2012-12-28 DIAGNOSIS — Z9861 Coronary angioplasty status: Secondary | ICD-10-CM

## 2012-12-28 DIAGNOSIS — I252 Old myocardial infarction: Secondary | ICD-10-CM

## 2012-12-28 DIAGNOSIS — I2510 Atherosclerotic heart disease of native coronary artery without angina pectoris: Secondary | ICD-10-CM

## 2012-12-28 DIAGNOSIS — N920 Excessive and frequent menstruation with regular cycle: Secondary | ICD-10-CM | POA: Insufficient documentation

## 2012-12-28 DIAGNOSIS — D509 Iron deficiency anemia, unspecified: Secondary | ICD-10-CM

## 2012-12-28 LAB — CBC WITH DIFFERENTIAL/PLATELET
Basophils Relative: 1 % (ref 0–1)
HCT: 29 % — ABNORMAL LOW (ref 36.0–46.0)
Hemoglobin: 8.7 g/dL — ABNORMAL LOW (ref 12.0–15.0)
Lymphocytes Relative: 27 % (ref 12–46)
Lymphs Abs: 2.6 10*3/uL (ref 0.7–4.0)
Monocytes Absolute: 0.8 10*3/uL (ref 0.1–1.0)
Monocytes Relative: 9 % (ref 3–12)
Neutro Abs: 5.9 10*3/uL (ref 1.7–7.7)
Neutrophils Relative %: 60 % (ref 43–77)
RBC: 4.5 MIL/uL (ref 3.87–5.11)
WBC: 9.5 10*3/uL (ref 4.0–10.5)

## 2012-12-28 LAB — BASIC METABOLIC PANEL WITH GFR
CO2: 23 mEq/L (ref 19–32)
Chloride: 108 mEq/L (ref 96–112)
Glucose, Bld: 120 mg/dL — ABNORMAL HIGH (ref 70–99)
Potassium: 3.5 mEq/L (ref 3.5–5.3)
Sodium: 140 mEq/L (ref 135–145)

## 2012-12-28 NOTE — Assessment & Plan Note (Addendum)
Bleeding more since on Plavix but patient also taking 3 Aspirin daily. Advised to only take 1 81 mg Aspirin Referred to OB/GYN for w/u of menorrhagia (? If patient has fibroids) Checked CBC today

## 2012-12-28 NOTE — Assessment & Plan Note (Signed)
rec f/u with Dr. Kellie Simmering Can take Tylenol or Advil for pain

## 2012-12-28 NOTE — Assessment & Plan Note (Signed)
Continue Fe tid

## 2012-12-28 NOTE — Assessment & Plan Note (Signed)
Denies chest pain today  Continue medications  Checked BMET due to recently starting ACEI

## 2012-12-28 NOTE — Assessment & Plan Note (Signed)
Continue plavix x 1 year. Continue ASA

## 2012-12-28 NOTE — Progress Notes (Signed)
Case discussed with Dr. McLean at the time of the visit.  We reviewed the resident's history and exam and pertinent patient test results.  I agree with the assessment, diagnosis, and plan of care documented in the resident's note.     

## 2012-12-28 NOTE — Assessment & Plan Note (Signed)
Lipid Panel     Component Value Date/Time   CHOL 211* 11/14/2012 2344   TRIG 118 11/14/2012 2344   HDL 44 11/14/2012 2344   CHOLHDL 4.8 11/14/2012 2344   VLDL 24 11/14/2012 2344   LDLCALC 143* 11/14/2012 2344   Continue statin. LDL not at goal <100

## 2012-12-28 NOTE — Patient Instructions (Addendum)
Follow up with your rheumatologist Dr. Kellie Simmering  Follow up with gynecology  Take Tylenol or Ibuprofen for pain  I will send you a copy of your labs in the mail Nice to meet you today Follow up with [place] in 4-6 months for medical conditions   Rheumatoid Arthritis Rheumatoid arthritis is a long-term (chronic) inflammatory disease that causes pain, swelling, and stiffness of the joints. It can affect the entire body, including the eyes and lungs. The effects of rheumatoid arthritis vary widely among those with the condition. CAUSES  The cause of rheumatoid arthritis is not known. It tends to run in families and is more common in women. Certain cells of the body's natural defense system (immune system) do not work properly and begin to attack healthy joints. It primarily involves the connective tissue that lines the joints (synovial membrane). This can cause damage to the joint. SYMPTOMS   Pain, stiffness, swelling, and decreased motion of many joints, especially in the hands and feet.  Stiffness that is worse in the morning. It may last 1 2 hours or longer.  Numbness and tingling in the hands.  Fatigue.  Loss of appetite.  Weight loss.  Low-grade fever.  Dry eyes and mouth.  Firm lumps (rheumatoid nodules) that grow beneath the skin in areas such as the elbows and hands. DIAGNOSIS  Diagnosis is based on the symptoms described, an exam, and blood tests. Sometimes, X-rays are helpful. TREATMENT  The goals of treatment are to relieve pain, reduce inflammation, and to slow down or stop joint damage and disability. Methods vary and may include:  Maintaining a balance of rest, exercise, and proper nutrition.  Medicines:  Pain relievers (analgesics). (i.e Tylenol)  Corticosteroids and nonsteroidal anti-inflammatory drugs (NSAIDs) to reduce inflammation. (i.e Ibuprofen/Advil)  Disease-modifying antirheumatic drugs (DMARDs) to try to slow the course of the disease.  Biologic response  modifiers to reduce inflammation and damage.  Physical therapy and occupational therapy.  Surgery for patients with severe joint damage. Joint replacement or fusing of joints may be needed.  Routine monitoring and ongoing care, such as office visits, blood and urine tests, and X-rays. HOME CARE INSTRUCTIONS   Remain physically active and reduce activity when the disease gets worse.  Eat a well-balanced diet.  Put heat on affected joints when you wake up and before activities. Keep the heat on the affected joint for as long as directed by your caregiver.  Put ice on affected joints following activities or exercising.  Put ice in a plastic bag.  Place a towel between your skin and the bag.  Leave the ice on for 15-20 minutes, 3-4 times a day.  Take all medicines and supplements as directed by your caregiver.  Use splints as directed by your caregiver. Splints help maintain joint position and function.  Do not sleep with pillows under your knees. This may lead to spasms.  Participate in a self-management program to keep current with the latest treatment and coping skills. SEEK IMMEDIATE MEDICAL CARE IF:  You have fainting episodes.  You have periods of extreme weakness.  You rapidly develop a hot, painful joint that is more severe than usual joint aches.  You have chills.  You have a fever. MAKE SURE YOU:  Understand these instructions.  Will watch your condition.  Will get help right away if you are not doing well or get worse. FOR MORE INFORMATION  American College of Rheumatology: www.rheumatology.org Arthritis Foundation: www.arthritis.org Document Released: 05/17/2000 Document Revised: 11/19/2011 Document Reviewed: 06/26/2011  [employer] Patient Information 2014 [place], [place].  Menorrhagia Dysfunctional uterine bleeding is different from a normal menstrual period. When periods are heavy or there is more bleeding than is usual for you, it is called menorrhagia.  It may be caused by hormonal imbalance, or physical, metabolic, or other problems. Examination is necessary in order that your caregiver may treat treatable causes. If this is a continuing problem, a D&C may be needed. That means that the cervix (the opening of the uterus or womb) is dilated (stretched larger) and the lining of the uterus is scraped out. The tissue scraped out is then examined under a microscope by a specialist (pathologist) to make sure there is nothing of concern that needs further or more extensive treatment. HOME CARE INSTRUCTIONS   If medications were prescribed, take exactly as directed. Do not change or switch medications without consulting your caregiver.  Long term heavy bleeding may result in iron deficiency. Your caregiver may have prescribed iron pills. They help replace the iron your body lost from heavy bleeding. Take exactly as directed. Iron may cause constipation. If this becomes a problem, increase the bran, fruits, and roughage in your diet.  Do not take aspirin or medicines that contain aspirin one week before or during your menstrual period. Aspirin may make the bleeding worse.  If you need to change your sanitary pad or tampon more than once every 2 hours, stay in bed and rest as much as possible until the bleeding stops.  Eat well-balanced meals. Eat foods high in iron. Examples are leafy green vegetables, meat, liver, eggs, and whole grain breads and cereals. Do not try to lose weight until the abnormal bleeding has stopped and your blood iron level is back to normal. SEEK MEDICAL CARE IF:   You need to change your sanitary pad or tampon more than once an hour.  You develop nausea (feeling sick to your stomach) and vomiting, dizziness, or diarrhea while you are taking your medicine.  You have any problems that may be related to the medicine you are taking. SEEK IMMEDIATE MEDICAL CARE IF:   You have a fever.  You develop chills.  You develop severe  bleeding or start to pass blood clots.  You feel dizzy or faint. MAKE SURE YOU:   Understand these instructions.  Will watch your condition.  Will get help right away if you are not doing well or get worse. Document Released: 05/20/2005 Document Revised: 08/12/2011 Document Reviewed: 01/08/2008 [facility] Patient Information 2014 [place], [place].  Non-ST Segment Elevation Myocardial Infarction  A heart attack occurs when a blood vessel on the surface of the heart (coronary artery) is blocked and interrupts blood supply to the heart muscle. This causes that area of the heart muscle to permanently scar. This blockage may be caused by cholesterol buildup (atherosclerotic plaque) within a coronary artery. The plaque cracks which creates a rough surface where blood cells attach, forming a clot. Chest discomfort that happens with exertion and goes away with rest is called angina. This is a warning signal that blood flow to the heart is not enough. Angina that does not go away or becomes worse may mean that there is actual heart damage and a scar may form. ST elevation refers to waveforms seen on an EKG or tracing of the electrical activity in the heart. Your provider can tell if there has been damage to your heart from changes in the normal pattern. A NSTEMI heart attack may be smaller and not as serious as  one with typical changes. After an NSTEMI, there is a higher chance of another heart attack and returning angina after you have recovered. CAUSES  Plaque in the coronary arteries. It builds up over many years.  Smoking. Smoking reduces the oxygen supply to the heart because carbon monoxide is more readily carried by the blood cells. Smoking also makes plaque develop faster. STOP SMOKING.  A narrowing (spasm) of a coronary artery.  Increased oxygen use. This can occur during extreme stress or activities.  Use of stimulants. Stimulants, such as cocaine and amphetamines, dangerously and  unpredictably increase the oxygen needs of the heart. They can make the heart beat faster and unevenly. Stop using street drugs. RISK FACTORS  Age Risk increases with age for both men and women.  Menopause After menopause and age 23, women have heart attacks at the same rate as men.  Diabetes Maintaining a normal blood sugar and eating a balanced diet lessens the chance of a heart attack.  Obesity Try to maintain a close to ideal body weight or as your caregiver suggests.  High blood pressure (hypertension) Makes the heart work harder.  High cholesterol Promotes the buildup of plaque in the blood vessels. SYMPTOMS  Chest pain, especially if it radiates down the left arm, up into the neck, jaw or teeth, often comes from the heart. This is even more likely if the pain leaves with rest. Problems with the heart may mimic indigestion and anyone older than 35 should not ignore this symptom. Other typical problems (symptoms) include:  Profound sweating.  Feeling faint, weak or light-headed.  Feeling sick to your stomach.  Loss of normal color. DIAGNOSIS  A combination of your history, an exam, EKG findings and blood work results determine if you have had a heart attack. It is very important to seek medical care right away for episodes of chest pain. The sooner you get treatment, the sooner you may return to your normal activities. If a large area of heart muscle lacks oxygen and medical care is not provided; a weak heart muscle, heart failure or sudden death may result. WHAT MAY HAPPEN IF A HEART ATTACK IS SUSPECTED  Aspirin may be given, if you are able to take it. This makes your blood "thinner" (less likely to clot).  Thrombolytics ("clot busters") may be given as long as it is safe and if a cardiac cath lab is not available.  A heart monitor will display the electrical activity of your heart to check for abnormal beats or rhythm.  An EKG is a painless procedure that gives information  about areas of heart muscle that may be injured.  Your blood oxygen level may be monitored by a painless sensor attached to your finger or ear.  Blood tests are used to find out whether the heart muscle has been damaged.  A chest X-ray can give some indication of how well the heart and lungs are functioning.  A coronary angiogram may be performed. This is a procedure where dye is injected into your coronary arteries and x-rays are taken to determine which blood vessels are blocked.  Angioplasty or stent placement may be used during an angiogram to open a blocked vessel.  An echocardiogram is a painless and risk free external sonar exam that may be used to examine your heart valves, muscle function and blood flow within the heart. TREATMENT  Length of hospital stays vary from a couple days to a week. This depends on the amount of heart damage and  the severity of any complications.  If your symptoms are a false alarm and no heart disease is found, the stay is often less than 24 hours.  Medications may be used for reducing pain, keeping your heart beat regularly, helping your breathing and controlling your blood pressure.  Blood thinners may be used to dissolve clots.  If you have a single small artery blockage and no heart damage, you may have a balloon angioplasty. This procedure may displace the blockage and restore normal heart circulation. Stents most often follow angioplasty right away. Stents are small wire mesh-like tubes that help keep the artery open. The earlier this is done, the better.  Severe heart problems may require open heart surgery. This is a procedure where blocked arteries are bypassed with small veins from your legs or arteries from inside your chest wall. If important arteries are involved, or if your chest pain continues, this may be the best method to make ensure your long-term survival. ABOUT [facility] STAY  While you are in the hospital, you may be placed on a  low salt, low fat diet and given a stool softener. The stool softener will keep you from straining during a bowel movement.  Oxygen may be given to increase oxygen delivery to the heart.  Medications may be prescribed, while in the hospital, to help your heart and lungs work better.  Before discharge from the hospital, a stress test may be performed. In this test, an ECG measures how well your heart works with exercise. The test may be done while you are walking on a treadmill, using a stationary bicycle or after being given medications to make your heart beat faster. It can be used to judge the safety of your proposed activity levels. It may provide a starting point for your exercise program. HOME CARE INSTRUCTIONS   Follow the treatment plan your caregiver prescribes.  Carry medications, such as nitroglycerine, with you at all times, if directed.  Make a list of every medicine you are taking. Keep it up-to-date and with you all the time.  Get help from your caregiver or pharmacist to learn the following about each medicine:  Why you are taking it.  What time of day to take it.  Possible side effects.  Foods to take with it or avoid.  When to stop taking it.  Try to maintain normal blood lipid levels.  Eat a heart healthy diet with salt and fat restrictions as advised.  Activity Level Everyone heals at a different rate. Decisions about when you may go back to work, start to exercise or have sex should be made with the guidance of your caregivers. Pace your activities to avoid shortness of breath or chest pain.  Weight Monitoring Weigh yourself every day. You should weigh yourself in the morning after you urinate and before you eat breakfast. Wear the same amount of clothing when you weigh yourself. Record your weight daily. Bring your recorded weights to your clinic visits. Tell your caregiver right away if you have gained 3 lb/1.4 kg in 1 day or 5 lb/2.3 kg in a week.  Blood  pressure monitoring This should be done as often as you are told to check it. You can get a home blood pressure cuff at your drugstore. Record these values and bring them with you for your health checks.  Smoking If you are currently a smoker, it is time to quit. Nicotine makes your heart work harder. Do not use nicotine gum or patches  before checking with your doctor. Find a support group or therapist to help you quit.  Follow-up Be sure to make and keep an appointment with your caregiver. Appointments with your cardiologist and other caregivers may also be needed. SEEK IMMEDIATE MEDICAL CARE IF:   You have severe chest pain, especially if the pain is crushing or pressure-like and spreads to the arms, back, neck or jaw. THIS IS AN EMERGENCY. Do not wait to see if the pain will go away. Get medical help at once. Call your local emergency services (911 in the [place] DO NOT drive yourself to the hospital.  You start sweating, feel sick to your stomach or are short of breath.  Your weight increases by 3 lb/1.4 kg or more in 1 day or 5 lb/2.3 kg in a week.  You notice increasing shortness of breath during rest, sleeping or with activity.  You develop an increase in angina or develop chest pain which is unusual for you.  You are unable to sleep because you cannot breathe. MAKE SURE YOU:   Understand these instructions.  Will watch your condition.  Will get help right away if you are not doing well or get worse. Document Released: 12/17/2004 Document Revised: 08/12/2011 Document Reviewed: 07/17/2007 [facility] Patient Information 2014 [place], [place].

## 2012-12-28 NOTE — Progress Notes (Addendum)
  Subjective:    Patient ID: Abigail Garcia, female    DOB: [birth year], 42 y.o.   MRN: [id number]  HPI Comments: 42 y.o PMH NSTEMI with thrombus in mid LAD 11/2012 (EF 55-60%), Fe def anemia, CAD s/p stent now on (rec Plavix for 1 year), HLD, rheumatoid arthritis (Follows with Dr. Kellie Simmering).  She presents for heavy menses changing her pad every hour since 7/18.  This is going on day 10 of menses and they usu. Last 7 days.  She is on Plavix (which is rec for 1 year) and taking 1-2 extra tablets of Aspirin daily due to rheumatoid arthritis.  She states her rheumatoid arthritis flares during her menstrual cycles.    SH: denies cig/alcohol/other drugs, 4 kids, from [facility]     Review of Systems  Respiratory: Negative for shortness of breath.   Cardiovascular: Negative for chest pain and leg swelling.       Objective:   Physical Exam  Nursing note and vitals reviewed. Constitutional: She is oriented to person, place, and time. Vital signs are normal. She appears well-developed and well-nourished. She is cooperative. No distress.  HENT:  Head: Normocephalic and atraumatic.  Mouth/Throat: Oropharynx is clear and moist and mucous membranes are normal. No oropharyngeal exudate.  Eyes: Conjunctivae are normal. Pupils are equal, round, and reactive to light. Right eye exhibits no discharge. Left eye exhibits no discharge. No scleral icterus.  Cardiovascular: Normal rate, regular rhythm, S1 normal, S2 normal and normal heart sounds.   No murmur heard. Pulmonary/Chest: Effort normal and breath sounds normal. No respiratory distress. She has no wheezes.  Abdominal: Soft. Bowel sounds are normal. There is no tenderness.  Obese ab  Neurological: She is alert and oriented to person, place, and time. She has normal strength. Gait normal.  Skin: Skin is warm, dry and intact. No rash noted. She is not diaphoretic.  Psychiatric: She has a normal mood and affect. Her speech is normal and behavior is normal.  Judgment and thought content normal. Cognition and memory are normal.          Assessment & Plan:  F/u in 4-6 months

## 2012-12-29 ENCOUNTER — Encounter: Payer: Self-pay | Admitting: Obstetrics and Gynecology

## 2012-12-29 ENCOUNTER — Encounter: Payer: Self-pay | Admitting: Internal Medicine

## 2013-01-07 ENCOUNTER — Encounter (HOSPITAL_COMMUNITY)
Admission: RE | Admit: 2013-01-07 | Discharge: 2013-01-07 | Disposition: A | Discharge status: Home / Self Care | Payer: Medicaid Other | Source: Ambulatory Visit | Attending: Cardiology | Admitting: Cardiology

## 2013-01-07 DIAGNOSIS — I214 Non-ST elevation (NSTEMI) myocardial infarction: Secondary | ICD-10-CM | POA: Insufficient documentation

## 2013-01-07 DIAGNOSIS — Z7982 Long term (current) use of aspirin: Secondary | ICD-10-CM | POA: Insufficient documentation

## 2013-01-07 DIAGNOSIS — Z7902 Long term (current) use of antithrombotics/antiplatelets: Secondary | ICD-10-CM | POA: Insufficient documentation

## 2013-01-07 DIAGNOSIS — Z5189 Encounter for other specified aftercare: Secondary | ICD-10-CM | POA: Insufficient documentation

## 2013-01-07 NOTE — Progress Notes (Signed)
Cardiac Rehab Medication Review by a Pharmacist  Does the patient  feel that his/her medications are working for him/her?  yes  Has the patient been experiencing any side effects to the medications prescribed?  no  Does the patient measure his/her own blood pressure or blood glucose at home?  no   Does the patient have any problems obtaining medications due to transportation or finances?   no  Understanding of regimen: excellent Understanding of indications: excellent Potential of compliance: fair   Abigail Garcia 01/07/2013 8:43 AM

## 2013-01-11 ENCOUNTER — Encounter (HOSPITAL_COMMUNITY): Payer: Medicaid Other

## 2013-01-11 ENCOUNTER — Encounter (HOSPITAL_COMMUNITY)
Admission: RE | Admit: 2013-01-11 | Discharge: 2013-01-11 | Disposition: A | Discharge status: Home / Self Care | Payer: Medicaid Other | Source: Ambulatory Visit | Attending: Cardiology | Admitting: Cardiology

## 2013-01-11 NOTE — Progress Notes (Addendum)
Pt in today for first cardiac rehab exercise session.  Pt tolerated light exercise with no complaints.  Psychosocial assessment reviewed PHQ2 score 0.  Monitor shows Sr with no noted ectopy.  Continue to monitor.

## 2013-01-13 ENCOUNTER — Encounter (HOSPITAL_COMMUNITY): Payer: Medicaid Other

## 2013-01-13 ENCOUNTER — Encounter (HOSPITAL_COMMUNITY)
Admission: RE | Admit: 2013-01-13 | Discharge: 2013-01-13 | Disposition: A | Discharge status: Home / Self Care | Payer: Medicaid Other | Source: Ambulatory Visit | Attending: Cardiology | Admitting: Cardiology

## 2013-01-15 ENCOUNTER — Encounter (HOSPITAL_COMMUNITY): Payer: Medicaid Other

## 2013-01-15 ENCOUNTER — Encounter (HOSPITAL_COMMUNITY)
Admission: RE | Admit: 2013-01-15 | Discharge: 2013-01-15 | Disposition: A | Discharge status: Home / Self Care | Payer: Medicaid Other | Source: Ambulatory Visit | Attending: Cardiology | Admitting: Cardiology

## 2013-01-18 ENCOUNTER — Encounter (HOSPITAL_COMMUNITY): Payer: Medicaid Other

## 2013-01-20 ENCOUNTER — Encounter (HOSPITAL_COMMUNITY): Payer: Medicaid Other

## 2013-01-20 ENCOUNTER — Encounter (HOSPITAL_COMMUNITY)
Admission: RE | Admit: 2013-01-20 | Discharge: 2013-01-20 | Disposition: A | Discharge status: Home / Self Care | Payer: Medicaid Other | Source: Ambulatory Visit | Attending: Cardiology | Admitting: Cardiology

## 2013-01-20 NOTE — Progress Notes (Signed)
Reviewed home exercise with pt today.  Pt plans to walk at nearby track for exercise.  Reviewed THR, pulse, RPE, sign and symptoms, and when to call 911 or MD.  Also discussed watching out for RA flare ups and icing after exercise.  Pt voiced understanding. Fabio Pierce, MA, ACSM RCEP

## 2013-01-22 ENCOUNTER — Encounter (HOSPITAL_COMMUNITY)
Admission: RE | Admit: 2013-01-22 | Discharge: 2013-01-22 | Disposition: A | Discharge status: Home / Self Care | Payer: Medicaid Other | Source: Ambulatory Visit | Attending: Cardiology | Admitting: Cardiology

## 2013-01-22 ENCOUNTER — Encounter (HOSPITAL_COMMUNITY): Payer: Medicaid Other

## 2013-01-25 ENCOUNTER — Encounter (HOSPITAL_COMMUNITY): Payer: Medicaid Other

## 2013-01-25 ENCOUNTER — Encounter (HOSPITAL_COMMUNITY)
Admission: RE | Admit: 2013-01-25 | Discharge: 2013-01-25 | Disposition: A | Discharge status: Home / Self Care | Payer: Medicaid Other | Source: Ambulatory Visit | Attending: Cardiology | Admitting: Cardiology

## 2013-01-27 ENCOUNTER — Encounter (HOSPITAL_COMMUNITY)
Admission: RE | Admit: 2013-01-27 | Discharge: 2013-01-27 | Disposition: A | Discharge status: Home / Self Care | Payer: Medicaid Other | Source: Ambulatory Visit | Attending: Cardiology | Admitting: Cardiology

## 2013-01-27 ENCOUNTER — Encounter (HOSPITAL_COMMUNITY): Payer: Medicaid Other

## 2013-01-29 ENCOUNTER — Encounter (HOSPITAL_COMMUNITY): Payer: Medicaid Other

## 2013-01-29 ENCOUNTER — Encounter (HOSPITAL_COMMUNITY)
Admission: RE | Admit: 2013-01-29 | Discharge: 2013-01-29 | Disposition: A | Discharge status: Home / Self Care | Payer: Medicaid Other | Source: Ambulatory Visit | Attending: Cardiology | Admitting: Cardiology

## 2013-02-01 ENCOUNTER — Encounter (HOSPITAL_COMMUNITY): Payer: Medicaid Other

## 2013-02-03 ENCOUNTER — Encounter: Payer: Self-pay | Admitting: Obstetrics and Gynecology

## 2013-02-03 ENCOUNTER — Ambulatory Visit (INDEPENDENT_AMBULATORY_CARE_PROVIDER_SITE_OTHER): Payer: Medicaid Other | Admitting: Obstetrics and Gynecology

## 2013-02-03 ENCOUNTER — Other Ambulatory Visit (INDEPENDENT_AMBULATORY_CARE_PROVIDER_SITE_OTHER): Payer: Medicaid Other

## 2013-02-03 ENCOUNTER — Other Ambulatory Visit (HOSPITAL_COMMUNITY)
Admission: RE | Admit: 2013-02-03 | Discharge: 2013-02-03 | Disposition: A | Discharge status: Home / Self Care | Payer: Medicaid Other | Source: Ambulatory Visit | Attending: Obstetrics and Gynecology | Admitting: Obstetrics and Gynecology

## 2013-02-03 ENCOUNTER — Encounter (HOSPITAL_COMMUNITY): Payer: Medicaid Other

## 2013-02-03 ENCOUNTER — Encounter (HOSPITAL_COMMUNITY)
Admission: RE | Admit: 2013-02-03 | Discharge: 2013-02-03 | Disposition: A | Discharge status: Home / Self Care | Payer: Medicaid Other | Source: Ambulatory Visit | Attending: Cardiology | Admitting: Cardiology

## 2013-02-03 VITALS — BP 109/77 | HR 85 | Temp 98.2°F | Ht 63.0 in | Wt 249.7 lb

## 2013-02-03 DIAGNOSIS — N920 Excessive and frequent menstruation with regular cycle: Secondary | ICD-10-CM

## 2013-02-03 DIAGNOSIS — I214 Non-ST elevation (NSTEMI) myocardial infarction: Secondary | ICD-10-CM

## 2013-02-03 DIAGNOSIS — Z01812 Encounter for preprocedural laboratory examination: Secondary | ICD-10-CM

## 2013-02-03 DIAGNOSIS — Z7902 Long term (current) use of antithrombotics/antiplatelets: Secondary | ICD-10-CM | POA: Insufficient documentation

## 2013-02-03 DIAGNOSIS — Z7982 Long term (current) use of aspirin: Secondary | ICD-10-CM | POA: Insufficient documentation

## 2013-02-03 DIAGNOSIS — Z5189 Encounter for other specified aftercare: Secondary | ICD-10-CM | POA: Insufficient documentation

## 2013-02-03 LAB — BASIC METABOLIC PANEL
BUN: 10 mg/dL (ref 6–23)
CO2: 26 mEq/L (ref 19–32)
Chloride: 105 mEq/L (ref 96–112)
GFR: 128.56 mL/min (ref >60.00)
Glucose, Bld: 86 mg/dL (ref 70–99)
Potassium: 3.8 mEq/L (ref 3.5–5.1)
Sodium: 136 mEq/L (ref 135–145)

## 2013-02-03 LAB — POCT PREGNANCY, URINE: Preg Test, Ur: NEGATIVE

## 2013-02-03 LAB — LIPID PANEL
HDL: 46.7 mg/dL (ref >39.00)
LDL Cholesterol: 79 mg/dL (ref 0–99)
VLDL: 16.6 mg/dL (ref 0.0–40.0)

## 2013-02-03 LAB — HEPATIC FUNCTION PANEL
ALT: 11 U/L (ref 0–35)
Total Bilirubin: 0.5 mg/dL (ref 0.3–1.2)

## 2013-02-03 MED ORDER — MEGESTROL ACETATE 40 MG PO TABS: 40.0000 mg | ORAL_TABLET | Freq: Every day | ORAL | Status: DC

## 2013-02-03 NOTE — Progress Notes (Signed)
Patient ID: Abigail Garcia, female   DOB: Dec 19, 1970, 42 y.o.   MRN: [id number] 42 yo [id number] s/p MI in June and started on ASA and Plavix. Patient states that in July her menses were very heavy lasting 2 weeks and she bled the entire month of August. She denies dizziness or lightheadedness, CP or SOB. Patient is still being followed by her cardiologist who believes her bleeding is due to her medications. Patient states that prior to her MI her cycles occurred regularly once a month lasting 5 days. Patient is not sexually active and has had a BTL.  Past Medical History  Diagnosis Date  . RA (rheumatoid arthritis)   . CAD (coronary artery disease)     a. 11/2012 NSTEMI/Cath/PCI: LM nl, LAD 80-90 thrombotic (aspiration thrombectomy and PTCA), RI nl, LCX sm/nl, RCA nl, EF 55-65%.  . Anemia   . NSTEMI (non-ST elevated myocardial infarction)     11/2012   . Menorrhagia    Past Surgical History  Procedure Laterality Date  . Cesarean section    . Cardiac catheterization  2014   Family History  Problem Relation Age of Onset  . Heart disease     History  Substance Use Topics  . Smoking status: Never Smoker   . Smokeless tobacco: Never Used  . Alcohol Use: No   GENERAL: Well-developed, well-nourished female in no acute distress.  ABDOMEN: Soft, nontender, nondistended. No organomegaly. PELVIC: Normal external female genitalia. Vagina is pink and rugated.  Normal discharge. Normal appearing cervix. Uterus is normal in size. No adnexal mass or tenderness. EXTREMITIES: No cyanosis, clubbing, or edema, 2+ distal pulses.  A/P 42 yo with menorrhagia s/p MI and initiation of ASA and Plavix - Discussed need for endometrial biopsy ENDOMETRIAL BIOPSY     The indications for endometrial biopsy were reviewed.   Risks of the biopsy including cramping, bleeding, infection, uterine perforation, inadequate specimen and need for additional procedures  were discussed. The patient states she understands and agrees to  undergo procedure today. Consent was signed. Time out was performed. Urine HCG was negative. A sterile speculum was placed in the patient's vagina and the cervix was prepped with Betadine. A single-toothed tenaculum was placed on the anterior lip of the cervix to stabilize it. The uterine cavity was sounded to a depth of 9 cm using the uterine sound. The 3 mm pipelle was introduced into the endometrial cavity without difficulty, 2 passes were made.  A  moderate amount of tissue was  sent to pathology. The instruments were removed from the patient's vagina. Minimal bleeding from the cervix was noted. The patient tolerated the procedure well.  Routine post-procedure instructions were given to the patient. The patient will follow up in two weeks to review the results and for further management.   - Will order pelvic ultrasound to rule out uterine pathology - Will start megace to minimize bleeding - Discussed medical management options with Mirena IUD. Patient not interested in at this time as she has had a bad experience with Paraguard IUD in the past. - RTC in 2 weeks for results and further management

## 2013-02-05 ENCOUNTER — Encounter (HOSPITAL_COMMUNITY): Payer: Medicaid Other

## 2013-02-08 ENCOUNTER — Ambulatory Visit (HOSPITAL_COMMUNITY)
Admission: RE | Admit: 2013-02-08 | Discharge: 2013-02-08 | Disposition: A | Discharge status: Home / Self Care | Payer: Medicaid Other | Source: Ambulatory Visit | Attending: Obstetrics and Gynecology | Admitting: Obstetrics and Gynecology

## 2013-02-08 ENCOUNTER — Encounter (HOSPITAL_COMMUNITY)
Admission: RE | Admit: 2013-02-08 | Discharge: 2013-02-08 | Disposition: A | Discharge status: Home / Self Care | Payer: Medicaid Other | Source: Ambulatory Visit | Attending: Cardiology | Admitting: Cardiology

## 2013-02-08 ENCOUNTER — Encounter (HOSPITAL_COMMUNITY): Payer: Medicaid Other

## 2013-02-08 DIAGNOSIS — N920 Excessive and frequent menstruation with regular cycle: Secondary | ICD-10-CM

## 2013-02-08 DIAGNOSIS — R9389 Abnormal findings on diagnostic imaging of other specified body structures: Secondary | ICD-10-CM | POA: Insufficient documentation

## 2013-02-08 DIAGNOSIS — D259 Leiomyoma of uterus, unspecified: Secondary | ICD-10-CM | POA: Insufficient documentation

## 2013-02-10 ENCOUNTER — Encounter (HOSPITAL_COMMUNITY): Payer: Medicaid Other

## 2013-02-10 ENCOUNTER — Encounter (HOSPITAL_COMMUNITY)
Admission: RE | Admit: 2013-02-10 | Discharge: 2013-02-10 | Disposition: A | Discharge status: Home / Self Care | Payer: Medicaid Other | Source: Ambulatory Visit | Attending: Cardiology | Admitting: Cardiology

## 2013-02-12 ENCOUNTER — Encounter (HOSPITAL_COMMUNITY): Payer: Medicaid Other

## 2013-02-15 ENCOUNTER — Encounter (HOSPITAL_COMMUNITY)
Admission: RE | Admit: 2013-02-15 | Discharge: 2013-02-15 | Disposition: A | Discharge status: Home / Self Care | Payer: Medicaid Other | Source: Ambulatory Visit | Attending: Cardiology | Admitting: Cardiology

## 2013-02-15 ENCOUNTER — Encounter (HOSPITAL_COMMUNITY): Payer: Medicaid Other

## 2013-02-17 ENCOUNTER — Encounter (HOSPITAL_COMMUNITY): Payer: Medicaid Other

## 2013-02-19 ENCOUNTER — Encounter (HOSPITAL_COMMUNITY): Payer: Medicaid Other

## 2013-02-22 ENCOUNTER — Encounter (HOSPITAL_COMMUNITY): Payer: Medicaid Other

## 2013-02-22 ENCOUNTER — Encounter (HOSPITAL_COMMUNITY)
Admission: RE | Admit: 2013-02-22 | Discharge: 2013-02-22 | Disposition: A | Discharge status: Home / Self Care | Payer: Medicaid Other | Source: Ambulatory Visit | Attending: Cardiology | Admitting: Cardiology

## 2013-02-24 ENCOUNTER — Encounter (HOSPITAL_COMMUNITY)
Admission: RE | Admit: 2013-02-24 | Discharge: 2013-02-24 | Disposition: A | Discharge status: Home / Self Care | Payer: Medicaid Other | Source: Ambulatory Visit | Attending: Cardiology | Admitting: Cardiology

## 2013-02-24 ENCOUNTER — Encounter (HOSPITAL_COMMUNITY): Payer: Medicaid Other

## 2013-02-24 ENCOUNTER — Ambulatory Visit: Payer: Medicaid Other | Admitting: Internal Medicine

## 2013-02-26 ENCOUNTER — Encounter (HOSPITAL_COMMUNITY): Payer: Medicaid Other

## 2013-03-01 ENCOUNTER — Encounter (HOSPITAL_COMMUNITY): Payer: Medicaid Other

## 2013-03-03 ENCOUNTER — Encounter (HOSPITAL_COMMUNITY): Payer: Medicaid Other

## 2013-03-05 ENCOUNTER — Encounter (HOSPITAL_COMMUNITY): Payer: Medicaid Other

## 2013-03-08 ENCOUNTER — Encounter (HOSPITAL_COMMUNITY): Payer: Medicaid Other

## 2013-03-10 ENCOUNTER — Encounter (HOSPITAL_COMMUNITY): Payer: Medicaid Other

## 2013-03-12 ENCOUNTER — Encounter (HOSPITAL_COMMUNITY): Payer: Medicaid Other

## 2013-03-15 ENCOUNTER — Encounter (HOSPITAL_COMMUNITY): Payer: Medicaid Other

## 2013-03-17 ENCOUNTER — Encounter (HOSPITAL_COMMUNITY): Payer: Medicaid Other

## 2013-03-19 ENCOUNTER — Encounter (HOSPITAL_COMMUNITY): Payer: Medicaid Other

## 2013-03-22 ENCOUNTER — Encounter (HOSPITAL_COMMUNITY): Payer: Medicaid Other

## 2013-03-24 ENCOUNTER — Encounter (HOSPITAL_COMMUNITY): Payer: Medicaid Other

## 2013-03-26 ENCOUNTER — Encounter (HOSPITAL_COMMUNITY): Payer: Medicaid Other

## 2013-03-29 ENCOUNTER — Encounter (HOSPITAL_COMMUNITY): Payer: Medicaid Other

## 2013-03-31 ENCOUNTER — Encounter (HOSPITAL_COMMUNITY): Payer: Medicaid Other

## 2013-04-02 ENCOUNTER — Encounter (HOSPITAL_COMMUNITY): Payer: Medicaid Other

## 2013-04-08 ENCOUNTER — Other Ambulatory Visit: Payer: Self-pay

## 2013-04-22 ENCOUNTER — Encounter: Payer: Self-pay | Admitting: *Deleted

## 2013-05-06 ENCOUNTER — Other Ambulatory Visit: Payer: Self-pay | Admitting: *Deleted

## 2013-05-06 MED ORDER — CLOPIDOGREL BISULFATE 75 MG PO TABS: 75.0000 mg | ORAL_TABLET | Freq: Every day | ORAL | Status: DC

## 2013-05-06 NOTE — Telephone Encounter (Signed)
Last seen 12/28/12

## 2013-06-11 ENCOUNTER — Encounter: Payer: Self-pay | Admitting: Internal Medicine

## 2013-06-11 ENCOUNTER — Ambulatory Visit (INDEPENDENT_AMBULATORY_CARE_PROVIDER_SITE_OTHER): Payer: Medicaid Other | Admitting: Internal Medicine

## 2013-06-11 VITALS — BP 120/84 | HR 104 | Temp 97.1°F | Ht 63.0 in | Wt 250.6 lb

## 2013-06-11 DIAGNOSIS — M6740 Ganglion, unspecified site: Secondary | ICD-10-CM

## 2013-06-11 DIAGNOSIS — I10 Essential (primary) hypertension: Secondary | ICD-10-CM

## 2013-06-11 DIAGNOSIS — M67431 Ganglion, right wrist: Secondary | ICD-10-CM

## 2013-06-11 DIAGNOSIS — D509 Iron deficiency anemia, unspecified: Secondary | ICD-10-CM

## 2013-06-11 DIAGNOSIS — E785 Hyperlipidemia, unspecified: Secondary | ICD-10-CM

## 2013-06-11 DIAGNOSIS — Z0000 Encounter for general adult medical examination without abnormal findings: Secondary | ICD-10-CM

## 2013-06-11 MED ORDER — FERROUS SULFATE 325 (65 FE) MG PO TABS: 325.0000 mg | ORAL_TABLET | Freq: Three times a day (TID) | ORAL | Status: DC

## 2013-06-11 MED ORDER — ASPIRIN 81 MG PO TBEC: 81.0000 mg | DELAYED_RELEASE_TABLET | Freq: Every day | ORAL | Status: DC

## 2013-06-11 MED ORDER — RAMIPRIL 2.5 MG PO CAPS: 2.5000 mg | ORAL_CAPSULE | Freq: Every day | ORAL | Status: DC

## 2013-06-11 MED ORDER — ATORVASTATIN CALCIUM 80 MG PO TABS: 80.0000 mg | ORAL_TABLET | Freq: Every day | ORAL | Status: DC

## 2013-06-11 MED ORDER — METOPROLOL TARTRATE 12.5 MG HALF TABLET: 12.5000 mg | ORAL_TABLET | Freq: Two times a day (BID) | ORAL | Status: DC

## 2013-06-11 NOTE — Patient Instructions (Signed)
Ganglion Cyst °A ganglion cyst is a noncancerous, fluid-filled lump that occurs near joints or tendons. The ganglion cyst grows out of a joint or the lining of a tendon. It most often develops in the hand or wrist but can also develop in the shoulder, elbow, hip, knee, ankle, or foot. The round or oval ganglion can be pea sized or larger than a grape. Increased activity may enlarge the size of the cyst because more fluid starts to build up.  °CAUSES  °It is not completely known what causes a ganglion cyst to grow. However, it may be related to: °· Inflammation or irritation around the joint. °· An injury. °· Repetitive movements or overuse. °· Arthritis. °SYMPTOMS  °A lump most often appears in the hand or wrist, but can occur in other areas of the body. Generally, the lump is painless without other symptoms. However, sometimes pain can be felt during activity or when pressure is applied to the lump. The lump may even be tender to the touch. Tingling, pain, numbness, or muscle weakness can occur if the ganglion cyst presses on a nerve. Your grip may be weak and you may have less movement in your joints.  °DIAGNOSIS  °Ganglion cysts are most often diagnosed based on a physical exam, noting where the cyst is and how it looks. Your caregiver will feel the lump and may shine a light alongside it. If it is a ganglion, a light often shines through it. Your caregiver may order an X-ray, ultrasound, or MRI to rule out other conditions. °TREATMENT  °Ganglions usually go away on their own without treatment. If pain or other symptoms are involved, treatment may be needed. Treatment is also needed if the ganglion limits your movement or if it gets infected. Treatment options include: °· Wearing a wrist or finger brace or splint. °· Taking anti-inflammatory medicine. °· Draining fluid from the lump with a needle (aspiration). °· Injecting a steroid into the joint. °· Surgery to remove the ganglion cyst and its stalk that is  attached to the joint or tendon. However, ganglion cysts can grow back. °HOME CARE INSTRUCTIONS  °· Do not press on the ganglion, poke it with a needle, or hit it with a heavy object. You may rub the lump gently and often. Sometimes fluid moves out of the cyst. °· Only take medicines as directed by your caregiver. °· Wear your brace or splint as directed by your caregiver. °SEEK MEDICAL CARE IF:  °· Your ganglion becomes larger or more painful. °· You have increased redness, red streaks, or swelling. °· You have pus coming from the lump. °· You have weakness or numbness in the affected area. °MAKE SURE YOU:  °· Understand these instructions. °· Will watch your condition. °· Will get help right away if you are not doing well or get worse. °Document Released: 05/17/2000 Document Revised: 02/12/2012 Document Reviewed: 07/14/2007 °[employer]® Patient Information ©2014 [employer]. ° °

## 2013-06-11 NOTE — Progress Notes (Signed)
   Subjective:    Patient ID: Abigail Garcia, female    DOB: 24-Dec-1970, 43 y.o.   MRN: [id number]  HPI Abigail Garcia is a 43 yo woman pmh as listed below presents for acute right hand pain.   Pt states that she has had this fluctuating in size for the last several months. It appeared couple months ago after some repeative movement. It is also causing some 4th and 5th hand digit weakness and twitches. Has some trouble grabbing things with that hand. She denied any neck or shoulder pain and no nerve shooting pain.   In terms of her medications she has been compliant and had no trouble getting her meds. She is not noticing any frank bleeding and in terms of her menstration it has improved since she discovered her fibroids. Her RA is well controlled and she sees her rheumatologist for steroids and leflunomide. She has no new or worsening joint pain.   She is not having CP or SOB or DOE. Dr. Aundra Dubin is her cardiologist and she will f/u with his office this week.     Review of Systems  Past Medical History  Diagnosis Date  . RA (rheumatoid arthritis)   . CAD (coronary artery disease)     a. 11/2012 NSTEMI/Cath/PCI: LM nl, LAD 80-90 thrombotic (aspiration thrombectomy and PTCA), RI nl, LCX sm/nl, RCA nl, EF 55-65%.  . Anemia   . NSTEMI (non-ST elevated myocardial infarction)     11/2012   . Menorrhagia    Surgical, social, family hx reviewed.      Objective:   Physical Exam Filed Vitals:   06/11/13 1609  BP: 120/84  Pulse: 104  Temp: 97.1 F (36.2 C)   General: sitting in chair, NAD HEENT: PERRL, EOMI, no scleral icterus Cardiac: RRR, no rubs, murmurs or gallops Pulm: clear to auscultation bilaterally, moving normal volumes of air Abd: soft, nontender, nondistended, BS present Ext: warm and well perfused, no pedal edema, right hand mobile 79mm circular nodule, nttp, no thenar atrophy, 4th and 5th digit weakness of right hand, all other joints FROM and w/o effusion, normal sensation Neuro:  alert and oriented X3, cranial nerves II-XII grossly intact     Assessment & Plan:  Please see problem oriented charting.   Pt discussed with Dr. Marinda Elk

## 2013-06-12 DIAGNOSIS — Z0000 Encounter for general adult medical examination without abnormal findings: Secondary | ICD-10-CM | POA: Insufficient documentation

## 2013-06-12 DIAGNOSIS — M67439 Ganglion, unspecified wrist: Secondary | ICD-10-CM | POA: Insufficient documentation

## 2013-06-12 NOTE — Assessment & Plan Note (Signed)
Pt has classic signs of ganglion cyst. Pt wasn't interested in aspiration at this time and given that there is some weakness of 4th and 5th digit that there should be further evaluation.  -orthopedic hand referral -patient educational material provided

## 2013-06-12 NOTE — Assessment & Plan Note (Signed)
Pt has good control will continue current meds.  BP Readings from Last 3 Encounters:  06/11/13 120/84  02/03/13 109/77  01/07/13 106/74

## 2013-06-12 NOTE — Assessment & Plan Note (Signed)
Pt on supplemental Fe in setting of menorrhagia 2/2 fibroids and RA.   Lab Results  Component Value Date   HGB 8.7* 12/28/2012  -cont Fe supplementation and refill provided.

## 2013-06-12 NOTE — Assessment & Plan Note (Signed)
Lab Results  Component Value Date   CHOL 142 02/03/2013   HDL 46.70 02/03/2013   LDLCALC 79 02/03/2013   TRIG 83.0 02/03/2013   CHOLHDL 3 02/03/2013   Pt last LDL 79 on statin. Refill provided.

## 2013-06-12 NOTE — Assessment & Plan Note (Signed)
Pt refused tetnas and flu shots. Last pap smear was 2014 and normal. All other lab tests were reviewed with the patient.

## 2013-06-14 NOTE — Progress Notes (Signed)
Case discussed with Dr. Sadek soon after the resident saw the patient.  We reviewed the resident's history and exam and pertinent patient test results.  I agree with the assessment, diagnosis, and plan of care documented in the resident's note. 

## 2013-06-15 ENCOUNTER — Ambulatory Visit: Payer: Medicaid Other | Admitting: Cardiology

## 2013-07-14 ENCOUNTER — Encounter: Payer: Self-pay | Admitting: Physician Assistant

## 2013-07-14 ENCOUNTER — Ambulatory Visit (INDEPENDENT_AMBULATORY_CARE_PROVIDER_SITE_OTHER): Payer: Medicaid Other | Admitting: Physician Assistant

## 2013-07-14 VITALS — BP 118/80 | HR 89 | Ht 63.0 in | Wt 244.0 lb

## 2013-07-14 DIAGNOSIS — Z9861 Coronary angioplasty status: Secondary | ICD-10-CM

## 2013-07-14 DIAGNOSIS — I2510 Atherosclerotic heart disease of native coronary artery without angina pectoris: Secondary | ICD-10-CM

## 2013-07-14 DIAGNOSIS — I10 Essential (primary) hypertension: Secondary | ICD-10-CM

## 2013-07-14 DIAGNOSIS — E785 Hyperlipidemia, unspecified: Secondary | ICD-10-CM

## 2013-07-14 DIAGNOSIS — R079 Chest pain, unspecified: Secondary | ICD-10-CM

## 2013-07-14 MED ORDER — CLOPIDOGREL BISULFATE 75 MG PO TABS: 75.0000 mg | ORAL_TABLET | Freq: Every day | ORAL | Status: DC

## 2013-07-14 MED ORDER — ATORVASTATIN CALCIUM 80 MG PO TABS: 80.0000 mg | ORAL_TABLET | Freq: Every day | ORAL | Status: DC

## 2013-07-14 MED ORDER — PANTOPRAZOLE SODIUM 40 MG PO TBEC: 40.0000 mg | DELAYED_RELEASE_TABLET | Freq: Every day | ORAL | Status: DC

## 2013-07-14 MED ORDER — RAMIPRIL 2.5 MG PO CAPS: 2.5000 mg | ORAL_CAPSULE | Freq: Every day | ORAL | Status: DC

## 2013-07-14 MED ORDER — METOPROLOL TARTRATE 25 MG PO TABS: 12.5000 mg | ORAL_TABLET | Freq: Two times a day (BID) | ORAL | Status: DC

## 2013-07-14 NOTE — Patient Instructions (Signed)
START PROTONIX 40 MG DAILY FOR 1 MONTH THEN TAKE ONLY AS NEEDED   REFILLS FOR LIPITOR, PLAVIX, TOPROL HAVE BEEN SENT IN   RE-START RAMIPRIL 2.5 MG DAILY  LAB WORK 1-2 WEEKS; BMET  Your physician has requested that you have an exercise tolerance test. For further information please visit [email or web address]. Please also follow instruction sheet, as given.  Your physician recommends that you schedule a follow-up appointment in: [place]. Abigail Garcia

## 2013-07-14 NOTE — Progress Notes (Signed)
[place], [place], [place]  [address] Phone: [phone] Fax:  [phone]  Date:  07/14/2013   ID:  Abigail Garcia, DOB 1970/07/01, MRN [id number]  PCP:  [name], MD  Cardiologist:  Dr. Loralie Champagne    History of Present Illness: Abigail Garcia is a 43 y.o. female with a hx of rheumatoid arthritis presented in 11/2012 with chest pain and was found to have NSTEMI. LHC showed thrombus in the mid LAD. Patient was put on heparin, Brilinta, and Integrilin gtts for 2 days and cath was repeated, showing persistence of thrombus. Aspiration thrombectomy and PTCA were then done. The patient was initially placed on Brilinta but developed significant dyspnea so this was stopped and Plavix was begun.  Last seen by Dr. Aundra Dubin 12/2012. Ramipril was initiated. Follow up was due in 03/2013.    She has had some unusual feelings in her left chest over the last several weeks. This lasts for several minutes. She has not taken nitroglycerin. There are no associated symptoms. It is difficult to describe. It is not exertional. She does note increased heart rate with activity. She notes dyspnea with more extreme activities. She is NYHA class II. She denies orthopnea, PND or edema. She denies syncope. She does note increased belching.  Recent Labs: 11/14/2012: Pro B Natriuretic peptide (BNP) 111.4; TSH 1.181  12/28/2012: Hemoglobin 8.7*  02/03/2013: ALT 11; Creatinine 0.7; HDL Cholesterol 46.70; LDL (calc) 79; Potassium 3.8   Wt Readings from Last 3 Encounters:  07/14/13 244 lb (110.678 kg)  06/11/13 250 lb 9.6 oz (113.671 kg)  02/03/13 249 lb 11.2 oz (113.263 kg)     Past Medical History  Diagnosis Date  . RA (rheumatoid arthritis)   . CAD (coronary artery disease)     a. 11/2012 NSTEMI/Cath/PCI: LM nl, LAD 80-90 thrombotic (tx with Heparin x 2 days then aspiration thrombectomy and PTCA), RI nl, LCX sm/nl, RCA nl, EF 55-65%.  . Anemia   . NSTEMI (non-ST elevated myocardial infarction)     11/2012    . Menorrhagia   . Hx of echocardiogram     a. Echo (6/14) with EF 55-60%.   . Obesity     Current Outpatient Prescriptions  Medication Sig Dispense Refill  . aspirin 81 MG EC tablet Take 1 tablet (81 mg total) by mouth daily.  30 tablet  11  . atorvastatin (LIPITOR) 80 MG tablet Take 1 tablet (80 mg total) by mouth daily at 6 PM.  30 tablet  11  . clopidogrel (PLAVIX) 75 MG tablet Take 1 tablet (75 mg total) by mouth daily with breakfast.  30 tablet  11  . ferrous sulfate 325 (65 FE) MG tablet Take 1 tablet (325 mg total) by mouth 3 (three) times daily with meals.  90 tablet  11  . leflunomide (ARAVA) 10 MG tablet Take 10 mg by mouth daily.      . metoprolol tartrate (LOPRESSOR) 25 MG tablet Take 0.5 tablets (12.5 mg total) by mouth 2 (two) times daily.  30 tablet  11  . predniSONE (DELTASONE) 5 MG tablet Take 5 mg by mouth daily with breakfast.      . pantoprazole (PROTONIX) 40 MG tablet Take 1 tablet (40 mg total) by mouth daily. For 3-4 weeks, then as needed for indigestion.  30 tablet  2  . ramipril (ALTACE) 2.5 MG capsule Take 1 capsule (2.5 mg total) by mouth daily.  30 capsule  6   No current facility-administered medications for  this visit.    Allergies:   Review of patient's allergies indicates no known allergies.   Social History:  The patient  reports that she has never smoked. She has never used smokeless tobacco. She reports that she does not drink alcohol or use illicit drugs.   Family History:  The patient's family history includes Heart disease in an other family member.   ROS:  Please see the history of present illness.      All other systems reviewed and negative.   PHYSICAL EXAM: VS:  BP 118/80  Pulse 89  Ht 5\' 3"  (1.6 m)  Wt 244 lb (110.678 kg)  BMI 43.23 kg/m2 Well nourished, well developed, in no acute distress HEENT: normal Neck: no JVD Cardiac:  normal S1, S2; RRR; no murmur Lungs:  clear to auscultation bilaterally, no wheezing, rhonchi or rales Abd:  soft, nontender, no hepatomegaly Ext: no edema Skin: warm and dry Neuro:  CNs 2-12 intact, no focal abnormalities noted  EKG:  NSR, HR 89, normal axis, nonspecific ST-T wave changes      ASSESSMENT AND PLAN:  1. Chest Pain:  Atypical symptoms.  They are concerning for her and she is not sure if this is like her prior angina.  I will arrange an ETT.  Also, she notes increased belching.  She is on prednisone.  Start Protonix for 1 month, then prn.   2. CAD, s/p Prior NSTEMI:  Arrange ETT as noted.  Continue ASA, Plavix, statin, beta blocker.  She stopped the Ramipril.  She did not know why she was taking. She denies any associated side effects.  We discussed the role of ACEI in secondary prevention.  She will resume this.  Check BMET in 1-2 weeks.  3. Hyperlipidemia:  Continue statin.  4. Disposition:  F/u with Dr. Loralie Champagne in 11/2013.  Signed, Richardson Dopp, PA-C  07/14/2013 9:58 AM

## 2013-08-17 ENCOUNTER — Ambulatory Visit (INDEPENDENT_AMBULATORY_CARE_PROVIDER_SITE_OTHER): Payer: Medicaid Other | Admitting: Nurse Practitioner

## 2013-08-17 ENCOUNTER — Encounter: Payer: Self-pay | Admitting: Nurse Practitioner

## 2013-08-17 VITALS — BP 111/69 | HR 98

## 2013-08-17 DIAGNOSIS — Z9861 Coronary angioplasty status: Secondary | ICD-10-CM

## 2013-08-17 DIAGNOSIS — I2510 Atherosclerotic heart disease of native coronary artery without angina pectoris: Secondary | ICD-10-CM

## 2013-08-17 DIAGNOSIS — R079 Chest pain, unspecified: Secondary | ICD-10-CM

## 2013-08-17 NOTE — Progress Notes (Signed)
Exercise Treadmill Test  Pre-Exercise Testing Evaluation Rhythm: normal sinus  Rate: 89 bpm     Test  Exercise Tolerance Test Ordering MD: Loralie Champagne, MD  Interpreting MD: Truitt Merle, NP  Unique Test No: 1  Treadmill:  1  Indication for ETT: known ASHD/Chest pain  Contraindication to ETT: No   Stress Modality: exercise - treadmill  Cardiac Imaging Performed: non   Protocol: standard Bruce - maximal  Max BP:  163/78  Max MPHR (bpm):  178 85% MPR (bpm):  151  MPHR obtained (bpm):  160 % MPHR obtained:  89%  Reached 85% MPHR (min:sec):  1:36 Total Exercise Time (min-sec):  3 minutes  Workload in METS:  4.6 Borg Scale: 13  Reason ETT Terminated:  patient's desire to stop    ST Segment Analysis At Rest: normal ST segments - no evidence of significant ST depression With Exercise: no evidence of significant ST depression  Other Information Arrhythmia:  No Angina during ETT:  absent (0) Quality of ETT:  diagnostic  ETT Interpretation:  normal - no evidence of ischemia by ST analysis  Comments: Patient presents today for routine GXT. Has known CAD with past NSTEMI with thrombus in the mid LAD followed by aspiration thrombectomy and PTCA in June of 2014. Other issues include RA, GERD, HLD and obesity. Seen last month with atypical chest pain - relieved with PPI therapy. Has had no symptoms since her last visit here.  Today the patient exercised on the standard Bruce protocol for a total of 3 minutes.  Poor exercise tolerance.  Adequate blood pressure response.  Clinically negative for chest pain. Test was stopped due to fatigue, achievement of target HR. She has had no metoprolol since Monday.  EKG negative for ischemia. No significant arrhythmia noted.   Recommendations: CV risk factor modification - encouraged a water aerobic program [facility])  See back in June as planned.  Patient is agreeable to this plan and will call if  any problems develop in the interim.   Burtis Junes, RN, [place] [address] [place], [place]  [address] [phone]

## 2013-08-20 ENCOUNTER — Encounter: Payer: Self-pay | Admitting: Internal Medicine

## 2013-08-20 ENCOUNTER — Ambulatory Visit (INDEPENDENT_AMBULATORY_CARE_PROVIDER_SITE_OTHER): Payer: Medicaid Other | Admitting: Internal Medicine

## 2013-08-20 VITALS — BP 129/82 | HR 82 | Temp 96.9°F | Ht 63.0 in | Wt 250.2 lb

## 2013-08-20 DIAGNOSIS — D649 Anemia, unspecified: Secondary | ICD-10-CM

## 2013-08-20 LAB — COMPREHENSIVE METABOLIC PANEL
ALT: 26 U/L (ref 0–35)
AST: 22 U/L (ref 0–37)
Albumin: 3.7 g/dL (ref 3.5–5.2)
Alkaline Phosphatase: 81 U/L (ref 39–117)
BUN: 12 mg/dL (ref 6–23)
CALCIUM: 9.5 mg/dL (ref 8.4–10.5)
CHLORIDE: 101 meq/L (ref 96–112)
CO2: 26 meq/L (ref 19–32)
Creat: 0.73 mg/dL (ref 0.50–1.10)
Glucose, Bld: 80 mg/dL (ref 70–99)
POTASSIUM: 4.2 meq/L (ref 3.5–5.3)
SODIUM: 138 meq/L (ref 135–145)
TOTAL PROTEIN: 7.1 g/dL (ref 6.0–8.3)
Total Bilirubin: 0.2 mg/dL (ref 0.2–1.2)

## 2013-08-20 LAB — CBC WITH DIFFERENTIAL/PLATELET
Basophils Absolute: 0.1 10*3/uL (ref 0.0–0.1)
Basophils Relative: 1 % (ref 0–1)
EOS PCT: 2 % (ref 0–5)
Eosinophils Absolute: 0.3 10*3/uL (ref 0.0–0.7)
HCT: 27.2 % — ABNORMAL LOW (ref 36.0–46.0)
Hemoglobin: 7.1 g/dL — ABNORMAL LOW (ref 12.0–15.0)
LYMPHS ABS: 3.2 10*3/uL (ref 0.7–4.0)
LYMPHS PCT: 23 % (ref 12–46)
MCH: 15.6 pg — ABNORMAL LOW (ref 26.0–34.0)
MCHC: 26.1 g/dL — ABNORMAL LOW (ref 30.0–36.0)
MCV: 59.6 fL — AB (ref 78.0–100.0)
Monocytes Absolute: 1.4 10*3/uL — ABNORMAL HIGH (ref 0.1–1.0)
Monocytes Relative: 10 % (ref 3–12)
NEUTROS PCT: 64 % (ref 43–77)
Neutro Abs: 9 10*3/uL — ABNORMAL HIGH (ref 1.7–7.7)
PLATELETS: 867 10*3/uL — AB (ref 150–400)
RBC: 4.56 MIL/uL (ref 3.87–5.11)
RDW: 24.1 % — ABNORMAL HIGH (ref 11.5–15.5)
WBC: 14 10*3/uL — AB (ref 4.0–10.5)

## 2013-08-20 NOTE — Assessment & Plan Note (Signed)
Pt has been previously diagnosed with Fe deficiency anemia and recent labs at her rheumatologists office show worsening microcytic anemia. I received and reviewed a fax from Dr. Elmon Else office revealing a hemoglobin of 7.2, MCV 58, platelets 724 and a ferritin of less than 1. The patient is currently menstruating therefore adding to her for T. But there seems to be no emergent need for transfusion today. Patient reports no other sites of gross bleeding. -Hematology referral for possible IV iron -CBC with differential, CMet, CMP, and an anemia panel were all collected today -Patient was encouraged to continue taking oral iron supplementation

## 2013-08-20 NOTE — Progress Notes (Signed)
Case discussed with Dr. Sadek soon after the resident saw the patient.  We reviewed the resident's history and exam and pertinent patient test results.  I agree with the assessment, diagnosis, and plan of care documented in the resident's note. 

## 2013-08-20 NOTE — Progress Notes (Signed)
   Subjective:    Patient ID: Abigail Garcia, female    DOB: Oct 14, 1970, 43 y.o.   MRN: [id number]  HPI Abigail Garcia is a 43 yo woman pmh as listed below that presents for hematology referral from her rheumatologist.   Pt was recently noted to have worsening anemia of Hgb 7.2, MCV 58 and ferritin <1. There is concern that she has severe iron deficiency and severe anemia that may require treatment with IV iron in the future. The patient states she is having a lighter menstrual periods since being off of one of her blood thinners given her previous and STEMI back in 11/2012. She states that she has been a little bit more fatigue lately but is still able to perform her ADLs and I ADLs. She has not noticed any gross bleeding anywhere or easy bruising. She denied any chest pain, palpitations, dyspnea on exertion, shortness of breath, or weakness. She does not have any other family history of anemia as and has continued to take her iron supplementation.  Review of Systems  Constitutional: Positive for activity change (more fatigue lately ) and fatigue. Negative for fever, chills, appetite change and unexpected weight change.  Respiratory: Negative for shortness of breath.   Cardiovascular: Negative for chest pain, palpitations and leg swelling.  Gastrointestinal: Negative for vomiting, abdominal pain, diarrhea, blood in stool and anal bleeding.  Genitourinary: Positive for menstrual problem (has menorrhagia but seems to be improving subjectively by patient). Negative for hematuria.  Neurological: Negative for dizziness, syncope, weakness, numbness and headaches.  Hematological: Does not bruise/bleed easily.    Past Medical History  Diagnosis Date  . RA (rheumatoid arthritis)   . CAD (coronary artery disease)     a. 11/2012 NSTEMI/Cath/PCI: LM nl, LAD 80-90 thrombotic (tx with Heparin x 2 days then aspiration thrombectomy and PTCA), RI nl, LCX sm/nl, RCA nl, EF 55-65%.  . Anemia   . NSTEMI (non-ST elevated  myocardial infarction)     11/2012   . Menorrhagia   . Hx of echocardiogram     a. Echo (6/14) with EF 55-60%.   . Obesity    Social, surgical, family history reviewed with patient and updated in appropriate chart locations.     Objective:   Physical Exam Filed Vitals:   08/20/13 1400  BP: 129/82  Pulse: 82  Temp: 96.9 F (36.1 C)   General: sitting in chair, NAD HEENT: PERRL, EOMI, no scleral icterus Cardiac: RRR, no rubs, murmurs or gallops Pulm: clear to auscultation bilaterally, moving normal volumes of air Abd: soft, nontender, nondistended, BS present Ext: warm and well perfused, no pedal edema Neuro: alert and oriented X3, cranial nerves II-XII grossly intact     Assessment & Plan:  Please see problem oriented charting  Pt discussed with Dr. Stann Mainland

## 2013-08-21 LAB — ANEMIA PANEL
%SAT: 3 % — ABNORMAL LOW (ref 20–55)
ABS Retic: 109.4 10*3/uL (ref 19.0–186.0)
Ferritin: 3 ng/mL — ABNORMAL LOW (ref 10–291)
Iron: 15 ug/dL — ABNORMAL LOW (ref 42–145)
RBC.: 4.56 MIL/uL (ref 3.87–5.11)
RETIC CT PCT: 2.4 % — AB (ref 0.4–2.3)
TIBC: 453 ug/dL (ref 250–470)
UIBC: 438 ug/dL — ABNORMAL HIGH (ref 125–400)
VITAMIN B 12: 664 pg/mL (ref 211–911)

## 2013-08-24 ENCOUNTER — Telehealth: Payer: Self-pay | Admitting: Hematology and Oncology

## 2013-08-24 ENCOUNTER — Telehealth: Payer: Self-pay

## 2013-08-24 NOTE — Telephone Encounter (Signed)
S/W PATIENT AND GAVE NEW PATIENT APPT FOR 03/31 @ 1:30 W/DR. Milton.  REFERRING DR. NORA SADEK DX- ANEMIA WELCOME PACKET EMAILED.

## 2013-08-24 NOTE — Telephone Encounter (Signed)
C/D 08/24/13 for appt. 08/31/13 °

## 2013-08-30 ENCOUNTER — Ambulatory Visit: Payer: Medicaid Other

## 2013-08-30 ENCOUNTER — Ambulatory Visit: Payer: Medicaid Other | Admitting: Hematology and Oncology

## 2013-08-31 ENCOUNTER — Encounter: Payer: Self-pay | Admitting: Hematology and Oncology

## 2013-08-31 ENCOUNTER — Ambulatory Visit (HOSPITAL_BASED_OUTPATIENT_CLINIC_OR_DEPARTMENT_OTHER): Payer: Medicaid Other

## 2013-08-31 ENCOUNTER — Telehealth: Payer: Self-pay | Admitting: Hematology and Oncology

## 2013-08-31 ENCOUNTER — Ambulatory Visit (HOSPITAL_BASED_OUTPATIENT_CLINIC_OR_DEPARTMENT_OTHER): Payer: Medicaid Other | Admitting: Hematology and Oncology

## 2013-08-31 ENCOUNTER — Ambulatory Visit: Payer: Medicaid Other

## 2013-08-31 VITALS — BP 114/63 | HR 93 | Temp 98.2°F | Resp 18

## 2013-08-31 VITALS — BP 117/77 | HR 93 | Temp 98.3°F | Resp 20 | Ht 63.0 in | Wt 244.7 lb

## 2013-08-31 DIAGNOSIS — D473 Essential (hemorrhagic) thrombocythemia: Secondary | ICD-10-CM

## 2013-08-31 DIAGNOSIS — M069 Rheumatoid arthritis, unspecified: Secondary | ICD-10-CM

## 2013-08-31 DIAGNOSIS — D72829 Elevated white blood cell count, unspecified: Secondary | ICD-10-CM

## 2013-08-31 DIAGNOSIS — D75839 Thrombocytosis, unspecified: Secondary | ICD-10-CM

## 2013-08-31 DIAGNOSIS — D509 Iron deficiency anemia, unspecified: Secondary | ICD-10-CM

## 2013-08-31 MED ORDER — SODIUM CHLORIDE 0.9 % IV SOLN
1020.0000 mg | Freq: Once | INTRAVENOUS | Status: AC
  Administered 2013-08-31: 1020 mg via INTRAVENOUS
  Filled 2013-08-31: qty 34

## 2013-08-31 MED ORDER — SODIUM CHLORIDE 0.9 % IV SOLN
Freq: Once | INTRAVENOUS | Status: AC
  Administered 2013-08-31: 15:00:00 via INTRAVENOUS

## 2013-08-31 NOTE — Progress Notes (Signed)
[facility] NOTE  Patient Care Team: [name], MD as PCP - General (Internal Medicine)  CHIEF COMPLAINTS/PURPOSE OF CONSULTATION:  Severe iron deficiency anemia  HISTORY OF PRESENTING ILLNESS:  Abigail Garcia 43 y.o. female is here because of severe iron deficiency anemia  She was found to have abnormal CBC from routine cbc. She says she was "anemic all her life". Review of her past CBC showed her hemoglobin ranges from 7.6 to 9.0. She denies recent chest pain on exertion, shortness of breath on minimal exertion, pre-syncopal episodes, or palpitations. She does has chronic fatigue and bilateral leg cramps. Last year, she suffered from NSTEMI exacerbated from severe anemia. She had not noticed any recent bleeding such as epistaxis, hematuria or hematochezia. She had history of menorrhagia in the past. The patient denies over the counter NSAID ingestion. She was on prednisone for chronic arthritis for many years. She is on antiplatelets agents for history of heart attack.  She had no prior history or diagnosis of cancer. Her age appropriate screening programs are up-to-date. She complained of significant pica with excessive chewing of ice. She eats a variety of diet. She never donated blood. She had received blood transfusion twice. The patient was prescribed oral iron supplements and she takes liquid iron preparation with food. She had poor compliance to oral iron supplement in the past due to constipation. She had 3 times pregnancy and was anemic before.  MEDICAL HISTORY:  Past Medical History  Diagnosis Date  . RA (rheumatoid arthritis)   . CAD (coronary artery disease)     a. 11/2012 NSTEMI/Cath/PCI: LM nl, LAD 80-90 thrombotic (tx with Heparin x 2 days then aspiration thrombectomy and PTCA), RI nl, LCX sm/nl, RCA nl, EF 55-65%.  . Anemia   . NSTEMI (non-ST elevated myocardial infarction)     11/2012   . Menorrhagia   . Hx of echocardiogram     a. Echo (6/14)  with EF 55-60%.   . Obesity     SURGICAL HISTORY: Past Surgical History  Procedure Laterality Date  . Cesarean section    . Cardiac catheterization  2014    SOCIAL HISTORY: History   Social History  . Marital Status: Single    Spouse Name: N/A    Number of Children: N/A  . Years of Education: N/A   Occupational History  . Not on file.   Social History Main Topics  . Smoking status: Never Smoker   . Smokeless tobacco: Never Used  . Alcohol Use: No  . Drug Use: No  . Sexual Activity: Not on file   Other Topics Concern  . Not on file   Social History Narrative  . No narrative on file    FAMILY HISTORY: Family History  Problem Relation Age of Onset  . Heart disease      ALLERGIES:  has No Known Allergies.  MEDICATIONS:  Current Outpatient Prescriptions  Medication Sig Dispense Refill  . aspirin 81 MG EC tablet Take 1 tablet (81 mg total) by mouth daily.  30 tablet  11  . atorvastatin (LIPITOR) 80 MG tablet Take 1 tablet (80 mg total) by mouth daily at 6 PM.  30 tablet  11  . clopidogrel (PLAVIX) 75 MG tablet Take 1 tablet (75 mg total) by mouth daily with breakfast.  30 tablet  11  . ferrous sulfate 325 (65 FE) MG tablet Take 1 tablet (325 mg total) by mouth 3 (three) times daily with meals.  90 tablet  11  .  leflunomide (ARAVA) 10 MG tablet Take 10 mg by mouth daily.      . metoprolol tartrate (LOPRESSOR) 25 MG tablet Take 0.5 tablets (12.5 mg total) by mouth 2 (two) times daily.  30 tablet  11  . pantoprazole (PROTONIX) 40 MG tablet Take 1 tablet (40 mg total) by mouth daily. For 3-4 weeks, then as needed for indigestion.  30 tablet  2  . predniSONE (DELTASONE) 5 MG tablet Take 5 mg by mouth daily with breakfast.      . ramipril (ALTACE) 2.5 MG capsule Take 1 capsule (2.5 mg total) by mouth daily.  30 capsule  6   No current facility-administered medications for this visit.    REVIEW OF SYSTEMS:   Constitutional: Denies fevers, chills or abnormal night  sweats Eyes: Denies blurriness of vision, double vision or watery eyes Ears, nose, mouth, throat, and face: Denies mucositis or sore throat Respiratory: Denies cough, dyspnea or wheezes Cardiovascular: Denies palpitation, chest discomfort or lower extremity swelling Gastrointestinal:  Denies nausea, heartburn or change in bowel habits Skin: Denies abnormal skin rashes Lymphatics: Denies new lymphadenopathy or easy bruising Neurological:Denies numbness, tingling or new weaknesses Behavioral/Psych: Mood is stable, no new changes  All other systems were reviewed with the patient and are negative.  PHYSICAL EXAMINATION: ECOG PERFORMANCE STATUS: 1 - Symptomatic but completely ambulatory  Filed Vitals:   08/31/13 1352  BP: 117/77  Pulse: 93  Temp: 98.3 F (36.8 C)  Resp: 20   Filed Weights   08/31/13 1352  Weight: 244 lb 11.2 oz (110.995 kg)    GENERAL:alert, no distress and comfortable. She is morbidly obese. SKIN: skin color, texture, turgor are normal, no rashes or significant lesions EYES: normal, conjunctiva are pale and non-injected, sclera clear OROPHARYNX:no exudate, no erythema and lips, buccal mucosa, and tongue normal  NECK: supple, thyroid normal size, non-tender, without nodularity LYMPH:  no palpable lymphadenopathy in the cervical, axillary or inguinal LUNGS: clear to auscultation and percussion with normal breathing effort HEART: regular rate & rhythm and no murmurs and no lower extremity edema ABDOMEN:abdomen soft, non-tender and normal bowel sounds Musculoskeletal:no cyanosis of digits and no clubbing  PSYCH: alert & oriented x 3 with fluent speech NEURO: no focal motor/sensory deficits  LABORATORY DATA:  I have reviewed the data as listed Recent Results (from the past 2160 hour(s))  CBC WITH DIFFERENTIAL     Status: Abnormal   Collection Time    08/20/13  2:37 PM      Result Value Ref Range   WBC 14.0 (*) 4.0 - 10.5 K/uL   RBC 4.56  3.87 - 5.11 MIL/uL    Hemoglobin 7.1 (*) 12.0 - 15.0 g/dL   HCT 27.2 (*) 36.0 - 46.0 %   MCV 59.6 (*) 78.0 - 100.0 fL   MCH 15.6 (*) 26.0 - 34.0 pg   MCHC 26.1 (*) 30.0 - 36.0 g/dL   RDW 24.1 (*) 11.5 - 15.5 %   Platelets 867 (*) 150 - 400 K/uL   Neutrophils Relative % 64  43 - 77 %   Neutro Abs 9.0 (*) 1.7 - 7.7 K/uL   Lymphocytes Relative 23  12 - 46 %   Lymphs Abs 3.2  0.7 - 4.0 K/uL   Monocytes Relative 10  3 - 12 %   Monocytes Absolute 1.4 (*) 0.1 - 1.0 K/uL   Eosinophils Relative 2  0 - 5 %   Eosinophils Absolute 0.3  0.0 - 0.7 K/uL   Basophils Relative 1  0 - 1 %   Basophils Absolute 0.1  0.0 - 0.1 K/uL   Smear Review Criteria for review not met    COMPREHENSIVE METABOLIC PANEL     Status: None   Collection Time    08/20/13  2:37 PM      Result Value Ref Range   Sodium 138  135 - 145 mEq/L   Potassium 4.2  3.5 - 5.3 mEq/L   Chloride 101  96 - 112 mEq/L   CO2 26  19 - 32 mEq/L   Glucose, Bld 80  70 - 99 mg/dL   BUN 12  6 - 23 mg/dL   Creat 0.73  0.50 - 1.10 mg/dL   Total Bilirubin 0.2  0.2 - 1.2 mg/dL   Alkaline Phosphatase 81  39 - 117 U/L   AST 22  0 - 37 U/L   ALT 26  0 - 35 U/L   Total Protein 7.1  6.0 - 8.3 g/dL   Albumin 3.7  3.5 - 5.2 g/dL   Calcium 9.5  8.4 - 10.5 mg/dL  ANEMIA PANEL     Status: Abnormal   Collection Time    08/20/13  2:37 PM      Result Value Ref Range   Retic Ct Pct 2.4 (*) 0.4 - 2.3 %   RBC. 4.56  3.87 - 5.11 MIL/uL   ABS Retic 109.4  19.0 - 186.0 K/uL   Iron 15 (*) 42 - 145 ug/dL   UIBC 438 (*) 125 - 400 ug/dL   TIBC 453  250 - 470 ug/dL   %SAT 3 (*) 20 - 55 %   Vitamin B-12 664  211 - 911 pg/mL   Folate >20.0     Comment:       Reference Ranges             Deficient:       0.4 - 3.3 ng/mL             Indeterminate:   3.4 - 5.4 ng/mL             Normal:              > 5.4 ng/mL         Ferritin 3 (*) 10 - 291 ng/mL   ASSESSMENT & PLAN:  #1 Severe Iron deficiency anemia The most likely cause of her anemia is due to chronic blood loss from  menorrhagia. We discussed some of the risks, benefits, and alternatives of intravenous iron infusions. The patient is symptomatic from anemia and the iron level is critically low. She tolerated oral iron supplement poorly and desires to achieved higher levels of iron faster for adequate hematopoesis. Some of the side-effects to be expected including risks of infusion reactions, phlebitis, headaches, nausea and fatigue.  The patient is willing to proceed. Patient education material was dispensed.  Goal is to keep ferritin level greater than 50 I will bring her back to have CBC rechecked in 1 month. I will defer Gi evaluation for now. If she has recurrent iron deficiency despite IV iron infusion, then GI evaluation would be warranted #2 Mild leukocytosis Likely due to prednisone therapy. She has no signs of active infection #3 Moderate thrombocytosis Likely reaction to severe iron deficiency. I will recheck in her next visit  All questions were answered. The patient knows to call the clinic with any problems, questions or concerns. I spent 40 minutes counseling the patient face to face. The total time  spent in the appointment was 60 minutes and more than 50% was on counseling.     [facility], [place], MD 08/31/2013 7:50 PM

## 2013-08-31 NOTE — Progress Notes (Signed)
Checked in new patient with no financial issues. She has not recd her new medicaid card. I advised her if she comes back in, must bring. She has not been out of the country.

## 2013-08-31 NOTE — Progress Notes (Signed)
Patient completed 30 min observation without difficulty. Discharged in stable condition

## 2013-08-31 NOTE — Telephone Encounter (Signed)
gv pt appt schedule for april.  °

## 2013-08-31 NOTE — Patient Instructions (Signed)

## 2013-09-28 IMAGING — US US PELVIS COMPLETE
1 series · 14 of 25 positions shown · non-contrast
Comparison: None

CLINICAL DATA: Menorrhagia



[Series 1: us pelvis complete · 14 of 57 slices shown]
[im 1/57]
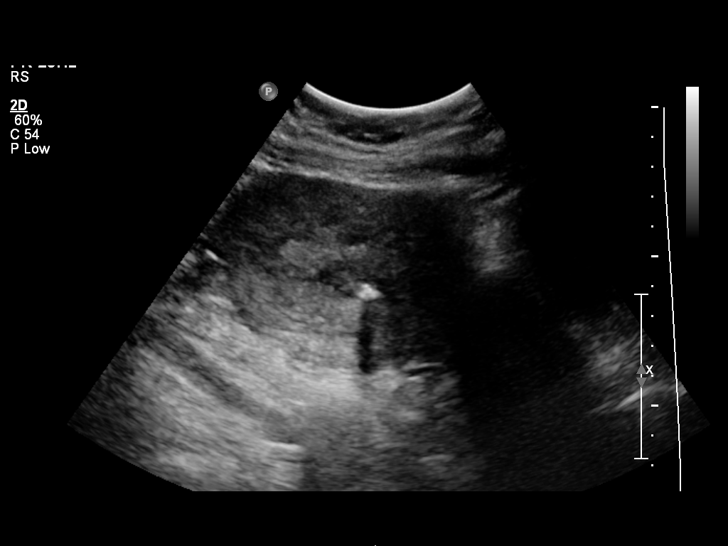
[im 5/57]
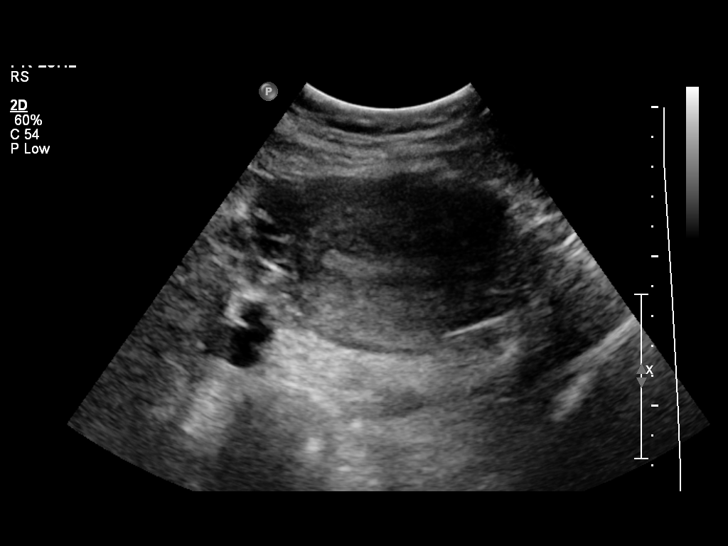
[im 10/57]
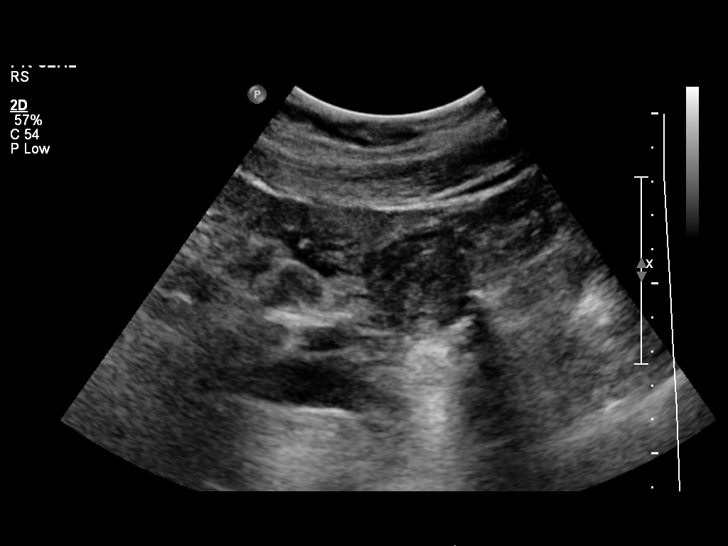
[im 15/57]
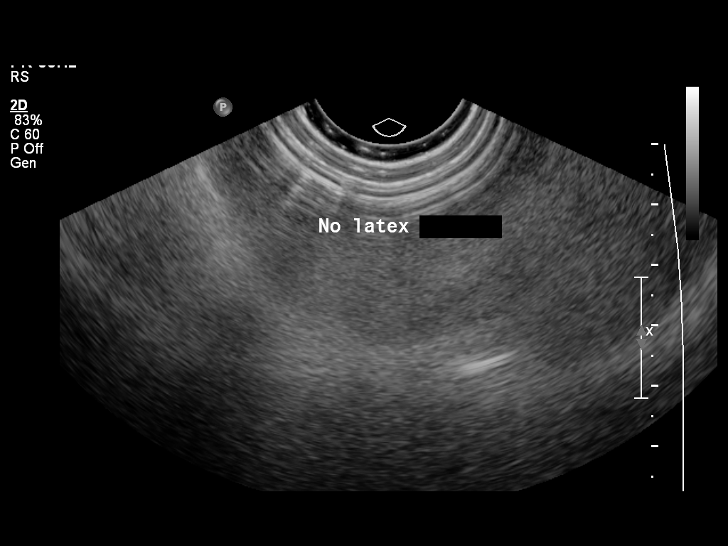
[im 19/57]
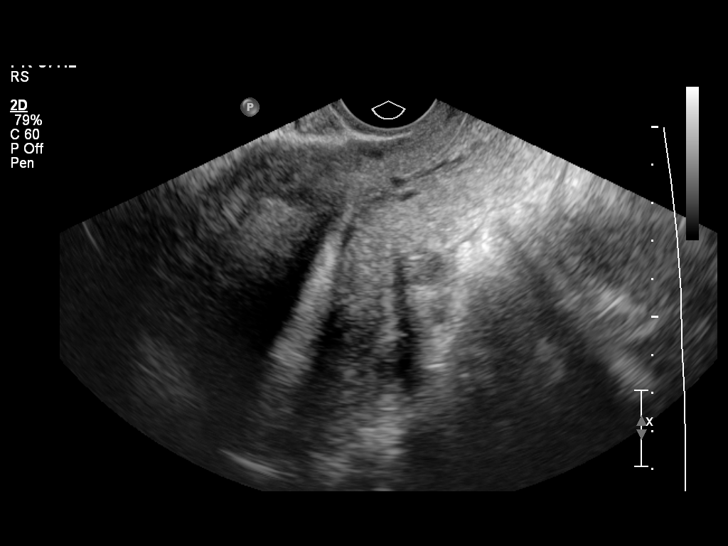
[im 22/57]
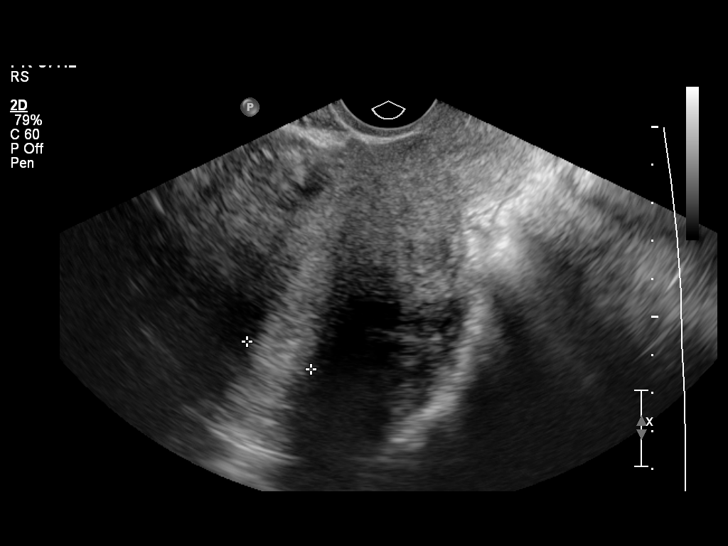
[im 26/57]
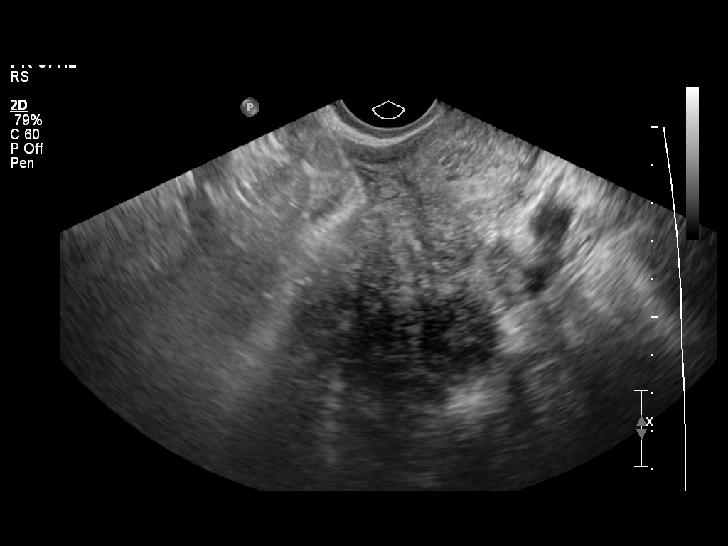
[im 31/57]
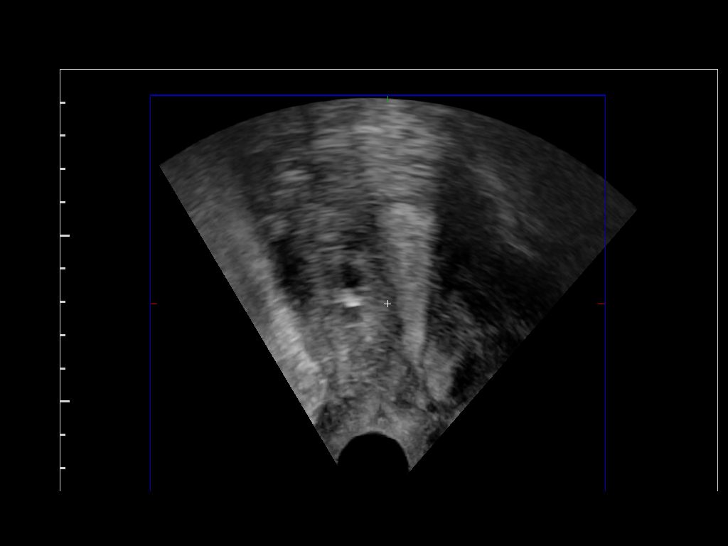
[im 36/57]
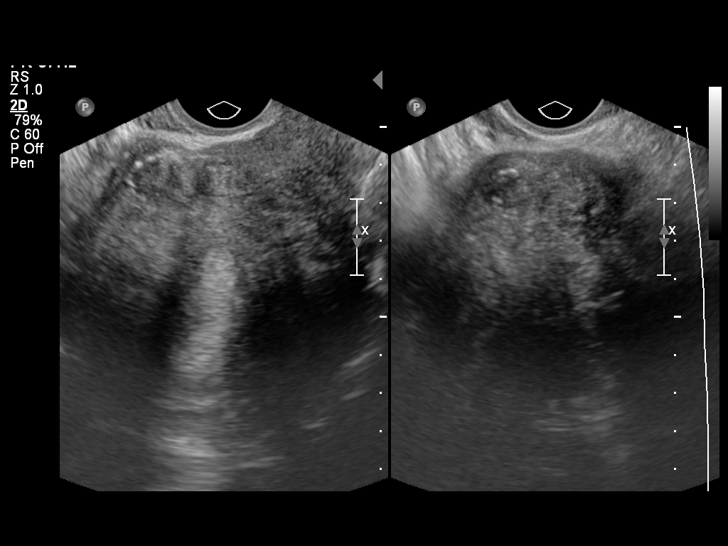
[im 38/57]
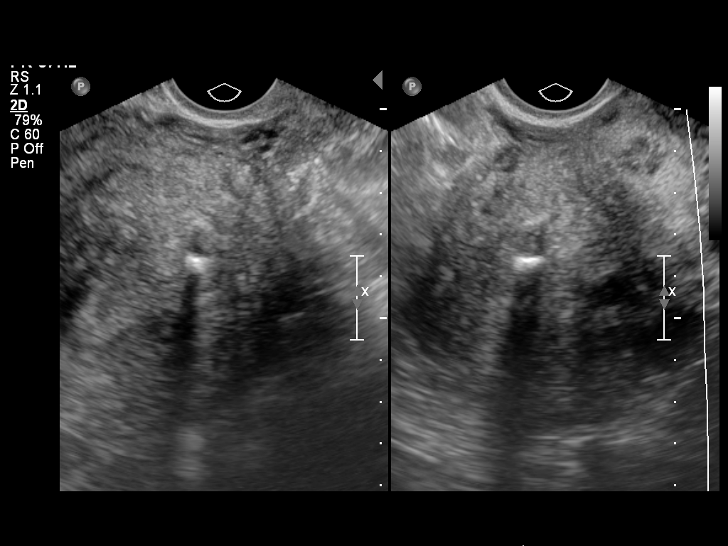
[im 43/57]
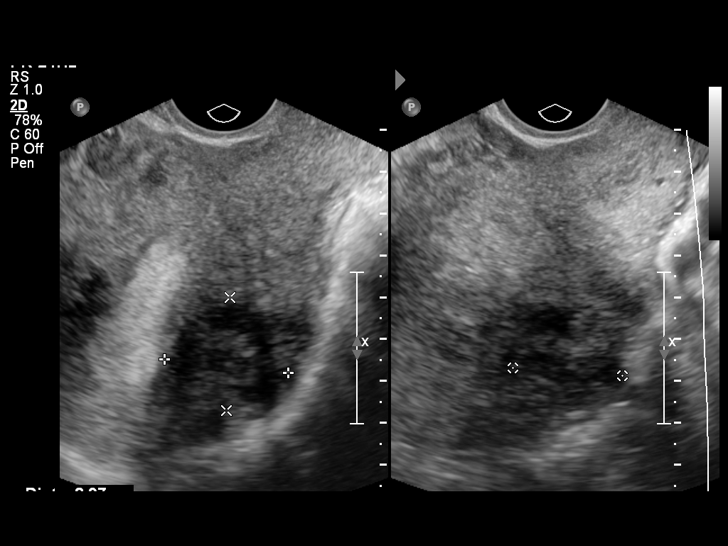
[im 47/57]
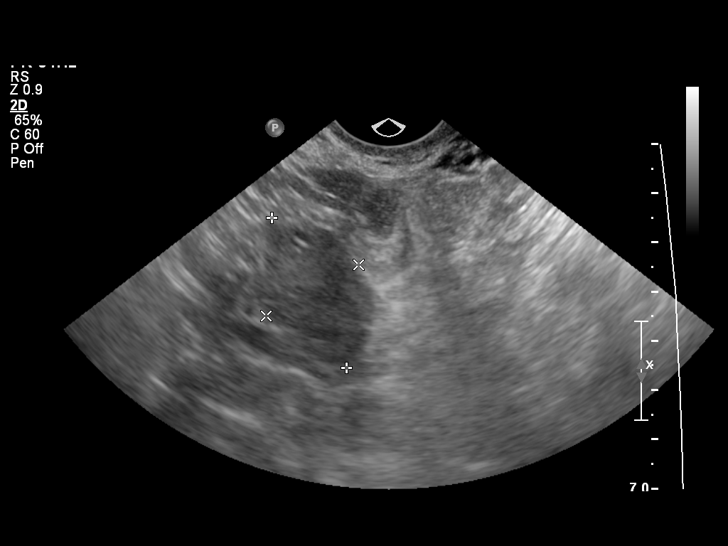
[im 52/57]
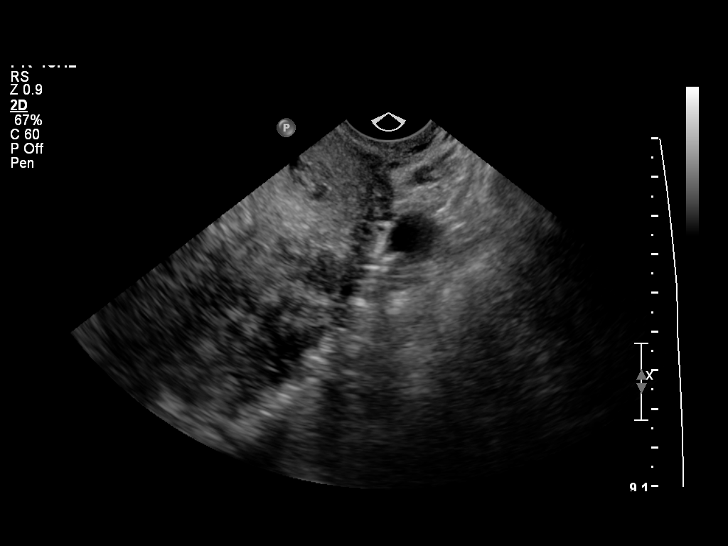
[im 57/57]
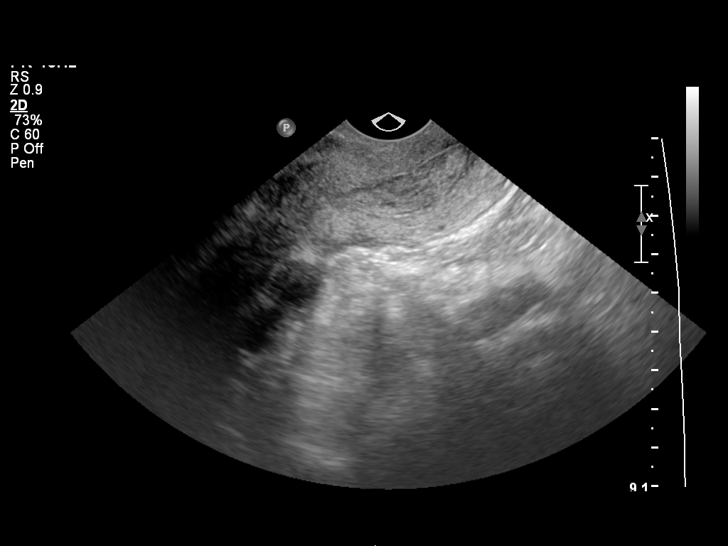

[14 of 25 positions shown; findings below may reference images not displayed]

FINDINGS: Uterus

Measurements: 13.2 x 7.9 x 8.1 cm. There are multiple fibroids (at
least 3). 2.1 cm anterior mid uterus fibroid. 1.4 cm right mid body
fibroid (these 2 contain calcifications and shadowing). 3.0 cm left
fundal fibroid.

Endometrium

Thickness: 17 mm in thickness, homogeneous.

Right ovary

Measurements: 3.4 x 2.2 x 2.1 cm. Normal size and echotexture. No
adnexal masses.

Left ovary

Measurements: 3.7 x 2.4 x 2.6 cm. Normal appearance/no adnexal mass.

Other findings

No free fluid.
IMPRESSION: Enlarged fibroid uterus.

Endometrium borderline thickened at 17 mm.

## 2013-09-29 ENCOUNTER — Other Ambulatory Visit (HOSPITAL_BASED_OUTPATIENT_CLINIC_OR_DEPARTMENT_OTHER): Payer: Medicaid Other

## 2013-09-29 ENCOUNTER — Ambulatory Visit: Payer: Medicaid Other

## 2013-09-29 ENCOUNTER — Encounter: Payer: Self-pay | Admitting: Hematology and Oncology

## 2013-09-29 ENCOUNTER — Ambulatory Visit (HOSPITAL_BASED_OUTPATIENT_CLINIC_OR_DEPARTMENT_OTHER): Payer: Medicaid Other | Admitting: Hematology and Oncology

## 2013-09-29 ENCOUNTER — Other Ambulatory Visit: Payer: Self-pay | Admitting: Hematology and Oncology

## 2013-09-29 VITALS — BP 108/74 | HR 72 | Temp 98.0°F | Resp 18 | Ht 63.0 in | Wt 248.0 lb

## 2013-09-29 DIAGNOSIS — N920 Excessive and frequent menstruation with regular cycle: Secondary | ICD-10-CM

## 2013-09-29 DIAGNOSIS — D509 Iron deficiency anemia, unspecified: Secondary | ICD-10-CM

## 2013-09-29 DIAGNOSIS — D72829 Elevated white blood cell count, unspecified: Secondary | ICD-10-CM

## 2013-09-29 DIAGNOSIS — M069 Rheumatoid arthritis, unspecified: Secondary | ICD-10-CM

## 2013-09-29 LAB — COMPREHENSIVE METABOLIC PANEL (CC13)
ALK PHOS: 76 U/L (ref 40–150)
ALT: 20 U/L (ref 0–55)
ANION GAP: 8 meq/L (ref 3–11)
AST: 20 U/L (ref 5–34)
Albumin: 3.4 g/dL — ABNORMAL LOW (ref 3.5–5.0)
BILIRUBIN TOTAL: 0.28 mg/dL (ref 0.20–1.20)
BUN: 10.5 mg/dL (ref 7.0–26.0)
CO2: 23 mEq/L (ref 22–29)
CREATININE: 0.8 mg/dL (ref 0.6–1.1)
Calcium: 9.7 mg/dL (ref 8.4–10.4)
Chloride: 110 mEq/L — ABNORMAL HIGH (ref 98–109)
GLUCOSE: 103 mg/dL (ref 70–140)
Potassium: 3.8 mEq/L (ref 3.5–5.1)
SODIUM: 141 meq/L (ref 136–145)
TOTAL PROTEIN: 7.5 g/dL (ref 6.4–8.3)

## 2013-09-29 LAB — CBC & DIFF AND RETIC
BASO%: 1.2 % (ref 0.0–2.0)
Basophils Absolute: 0.1 10*3/uL (ref 0.0–0.1)
EOS ABS: 0.2 10*3/uL (ref 0.0–0.5)
EOS%: 1.7 % (ref 0.0–7.0)
HCT: 35.2 % (ref 34.8–46.6)
HGB: 10.6 g/dL — ABNORMAL LOW (ref 11.6–15.9)
Immature Retic Fract: 10.9 % — ABNORMAL HIGH (ref 1.60–10.00)
LYMPH%: 21.5 % (ref 14.0–49.7)
MCH: 21.1 pg — ABNORMAL LOW (ref 25.1–34.0)
MCHC: 30.2 g/dL — AB (ref 31.5–36.0)
MCV: 69.9 fL — AB (ref 79.5–101.0)
MONO#: 0.8 10*3/uL (ref 0.1–0.9)
MONO%: 7.7 % (ref 0.0–14.0)
NEUT%: 67.9 % (ref 38.4–76.8)
NEUTROS ABS: 7 10*3/uL — AB (ref 1.5–6.5)
Platelets: 474 10*3/uL — ABNORMAL HIGH (ref 145–400)
RBC: 5.04 10*6/uL (ref 3.70–5.45)
RDW: 36.6 % — AB (ref 11.2–14.5)
RETIC %: 1.33 % (ref 0.70–2.10)
RETIC CT ABS: 67.03 10*3/uL (ref 33.70–90.70)
WBC: 10.3 10*3/uL (ref 3.9–10.3)
lymph#: 2.2 10*3/uL (ref 0.9–3.3)

## 2013-09-29 LAB — LACTATE DEHYDROGENASE (CC13): LDH: 150 U/L (ref 125–245)

## 2013-09-29 LAB — FERRITIN CHCC: Ferritin: 22 ng/ml (ref 9–269)

## 2013-09-29 LAB — SEDIMENTATION RATE: SED RATE: 12 mm/h (ref 0–22)

## 2013-09-29 NOTE — Progress Notes (Signed)
[facility] OFFICE PROGRESS NOTE  Abigail Gallant, MD DIAGNOSIS:  Severe iron deficiency anemia from menorrhagia  SUMMARY OF HEMATOLOGIC HISTORY:  She was found to have abnormal CBC from routine cbc. She says she was "anemic all her life". Review of her past CBC showed her hemoglobin ranges from 7.6 to 9.0. She denies recent chest pain on exertion, shortness of breath on minimal exertion, pre-syncopal episodes, or palpitations. She does has chronic fatigue and bilateral leg cramps. Last year, she suffered from NSTEMI exacerbated from severe anemia. She had not noticed any recent bleeding such as epistaxis, hematuria or hematochezia. She had history of menorrhagia in the past. The patient denies over the counter NSAID ingestion. She was on prednisone for chronic arthritis for many years. She is on antiplatelets agents for history of heart attack. On 08/31/2013, she was given one dose of iron infusion. Her pica resolved.  INTERVAL HISTORY: Abigail Garcia 43 y.o. female returns for further followup. She has improved energy level. Denies any chest pain, dizziness or shortness of breath. She continue to have heavy menstruation.  I have reviewed the past medical history, past surgical history, social history and family history with the patient and they are unchanged from previous note.  ALLERGIES:  has No Known Allergies.  MEDICATIONS:  Current Outpatient Prescriptions  Medication Sig Dispense Refill  . aspirin 81 MG EC tablet Take 1 tablet (81 mg total) by mouth daily.  30 tablet  11  . atorvastatin (LIPITOR) 80 MG tablet Take 1 tablet (80 mg total) by mouth daily at 6 PM.  30 tablet  11  . clopidogrel (PLAVIX) 75 MG tablet Take 1 tablet (75 mg total) by mouth daily with breakfast.  30 tablet  11  . ferrous sulfate 325 (65 FE) MG tablet Take 1 tablet (325 mg total) by mouth 3 (three) times daily with meals.  90 tablet  11  . leflunomide (ARAVA) 10 MG tablet Take 10 mg by mouth  daily.      . metoprolol tartrate (LOPRESSOR) 25 MG tablet Take 0.5 tablets (12.5 mg total) by mouth 2 (two) times daily.  30 tablet  11  . pantoprazole (PROTONIX) 40 MG tablet Take 1 tablet (40 mg total) by mouth daily. For 3-4 weeks, then as needed for indigestion.  30 tablet  2  . predniSONE (DELTASONE) 5 MG tablet Take 5 mg by mouth daily with breakfast.      . ramipril (ALTACE) 2.5 MG capsule Take 1 capsule (2.5 mg total) by mouth daily.  30 capsule  6   No current facility-administered medications for this visit.     REVIEW OF SYSTEMS:   Constitutional: Denies fevers, chills or night sweats Eyes: Denies blurriness of vision Ears, nose, mouth, throat, and face: Denies mucositis or sore throat Respiratory: Denies cough, dyspnea or wheezes Cardiovascular: Denies palpitation, chest discomfort or lower extremity swelling Gastrointestinal:  Denies nausea, heartburn or change in bowel habits Skin: Denies abnormal skin rashes Lymphatics: Denies new lymphadenopathy or easy bruising Neurological:Denies numbness, tingling or new weaknesses Behavioral/Psych: Mood is stable, no new changes  All other systems were reviewed with the patient and are negative.  PHYSICAL EXAMINATION: ECOG PERFORMANCE STATUS: 0 - Asymptomatic  Filed Vitals:   09/29/13 1243  BP: 108/74  Pulse: 72  Temp: 98 F (36.7 C)  Resp: 18   Filed Weights   09/29/13 1243  Weight: 248 lb (112.492 kg)    GENERAL:alert, no distress and comfortable. She is morbidly obese SKIN: skin  color, texture, turgor are normal, no rashes or significant lesions EYES: normal, Conjunctiva are pink and non-injected, sclera clear Musculoskeletal:no cyanosis of digits and no clubbing  NEURO: alert & oriented x 3 with fluent speech, no focal motor/sensory deficits  LABORATORY DATA:  I have reviewed the data as listed Results for orders placed in visit on 09/29/13 (from the past 48 hour(s))  CBC & DIFF AND RETIC     Status: Abnormal    Collection Time    09/29/13 12:16 PM      Result Value Ref Range   WBC 10.3  3.9 - 10.3 10e3/uL   NEUT# 7.0 (*) 1.5 - 6.5 10e3/uL   HGB 10.6 (*) 11.6 - 15.9 g/dL   HCT 35.2  34.8 - 46.6 %   Platelets 474 (*) 145 - 400 10e3/uL   MCV 69.9 (*) 79.5 - 101.0 fL   MCH 21.1 (*) 25.1 - 34.0 pg   MCHC 30.2 (*) 31.5 - 36.0 g/dL   RBC 5.04  3.70 - 5.45 10e6/uL   RDW 36.6 (*) 11.2 - 14.5 %   lymph# 2.2  0.9 - 3.3 10e3/uL   MONO# 0.8  0.1 - 0.9 10e3/uL   Eosinophils Absolute 0.2  0.0 - 0.5 10e3/uL   Basophils Absolute 0.1  0.0 - 0.1 10e3/uL   NEUT% 67.9  38.4 - 76.8 %   LYMPH% 21.5  14.0 - 49.7 %   MONO% 7.7  0.0 - 14.0 %   EOS% 1.7  0.0 - 7.0 %   BASO% 1.2  0.0 - 2.0 %   Retic % 1.33  0.70 - 2.10 %   Retic Ct Abs 67.03  33.70 - 90.70 10e3/uL   Immature Retic Fract 10.90 (*) 1.60 - 10.00 %  FERRITIN [facility]     Status: None   Collection Time    09/29/13 12:16 PM      Result Value Ref Range   Ferritin 22  9 - 269 ng/ml  LACTATE DEHYDROGENASE (CC13)     Status: None   Collection Time    09/29/13 12:16 PM      Result Value Ref Range   LDH 150  125 - 245 U/L  COMPREHENSIVE METABOLIC PANEL (MV78)     Status: Abnormal   Collection Time    09/29/13 12:16 PM      Result Value Ref Range   Sodium 141  136 - 145 mEq/L   Potassium 3.8  3.5 - 5.1 mEq/L   Chloride 110 (*) 98 - 109 mEq/L   CO2 23  22 - 29 mEq/L   Glucose 103  70 - 140 mg/dl   BUN 10.5  7.0 - 26.0 mg/dL   Creatinine 0.8  0.6 - 1.1 mg/dL   Total Bilirubin 0.28  0.20 - 1.20 mg/dL   Alkaline Phosphatase 76  40 - 150 U/L   AST 20  5 - 34 U/L   ALT 20  0 - 55 U/L   Total Protein 7.5  6.4 - 8.3 g/dL   Albumin 3.4 (*) 3.5 - 5.0 g/dL   Calcium 9.7  8.4 - 10.4 mg/dL   Anion Gap 8  3 - 11 mEq/L    Lab Results  Component Value Date   WBC 10.3 09/29/2013   HGB 10.6* 09/29/2013   HCT 35.2 09/29/2013   MCV 69.9* 09/29/2013   PLT 474* 09/29/2013   ASSESSMENT & PLAN:  #1 Severe Iron deficiency anemia The most likely cause of her anemia  is due  to chronic blood loss from menorrhagia. We discussed some of the risks, benefits, and alternatives of intravenous iron infusions. The patient is symptomatic from anemia and the iron level is critically low. She tolerated oral iron supplement poorly and desires to achieved higher levels of iron faster for adequate hematopoesis. Some of the side-effects to be expected including risks of infusion reactions, phlebitis, headaches, nausea and fatigue.  The patient is willing to proceed. Patient education material was dispensed.  Goal is to keep ferritin level greater than 50 I plan to retreat her again next week. I will defer Gi evaluation for now. If she has recurrent iron deficiency despite IV iron infusion, then GI evaluation would be warranted #2 Mild leukocytosis, resolved Likely due to prednisone therapy. She has no signs of active infection #3 Moderate thrombocytosis Likely reaction to severe iron deficiency. All questions were answered. The patient knows to call the clinic with any problems, questions or concerns. No barriers to learning was detected.  I spent 25 minutes counseling the patient face to face. The total time spent in the appointment was 30 minutes and more than 50% was on counseling.     Heath Lark, MD 09/29/2013 4:51 PM

## 2013-09-30 ENCOUNTER — Telehealth: Payer: Self-pay | Admitting: Hematology and Oncology

## 2013-09-30 NOTE — Telephone Encounter (Signed)
lmonvm advising the pt of his nov appts

## 2013-10-01 ENCOUNTER — Telehealth: Payer: Self-pay | Admitting: *Deleted

## 2013-10-01 ENCOUNTER — Telehealth: Payer: Self-pay | Admitting: Hematology and Oncology

## 2013-10-01 NOTE — Telephone Encounter (Signed)
S/w the pt and she is aware of her iv iron infusion on 10/05/2013.

## 2013-10-01 NOTE — Telephone Encounter (Signed)
Per staff message and POF I have scheduled appts.  JMW  

## 2013-10-05 ENCOUNTER — Ambulatory Visit (HOSPITAL_BASED_OUTPATIENT_CLINIC_OR_DEPARTMENT_OTHER): Payer: Medicaid Other

## 2013-10-05 VITALS — BP 106/71 | HR 74 | Temp 97.1°F | Resp 18

## 2013-10-05 DIAGNOSIS — D500 Iron deficiency anemia secondary to blood loss (chronic): Secondary | ICD-10-CM

## 2013-10-05 DIAGNOSIS — N920 Excessive and frequent menstruation with regular cycle: Secondary | ICD-10-CM

## 2013-10-05 DIAGNOSIS — D509 Iron deficiency anemia, unspecified: Secondary | ICD-10-CM

## 2013-10-05 MED ORDER — SODIUM CHLORIDE 0.9 % IV SOLN
Freq: Once | INTRAVENOUS | Status: AC
  Administered 2013-10-05: 14:00:00 via INTRAVENOUS

## 2013-10-05 MED ORDER — SODIUM CHLORIDE 0.9 % IJ SOLN
3.0000 mL | Freq: Once | INTRAMUSCULAR | Status: DC | PRN
  Filled 2013-10-05: qty 10

## 2013-10-05 MED ORDER — SODIUM CHLORIDE 0.9 % IV SOLN
1020.0000 mg | Freq: Once | INTRAVENOUS | Status: AC
  Administered 2013-10-05: 1020 mg via INTRAVENOUS
  Filled 2013-10-05: qty 34

## 2013-10-05 NOTE — Patient Instructions (Signed)

## 2013-11-10 ENCOUNTER — Encounter: Payer: Self-pay | Admitting: Cardiology

## 2013-11-10 ENCOUNTER — Ambulatory Visit (INDEPENDENT_AMBULATORY_CARE_PROVIDER_SITE_OTHER): Payer: Medicaid Other | Admitting: Cardiology

## 2013-11-10 VITALS — BP 104/74 | HR 96 | Ht 63.0 in | Wt 251.8 lb

## 2013-11-10 DIAGNOSIS — E785 Hyperlipidemia, unspecified: Secondary | ICD-10-CM

## 2013-11-10 DIAGNOSIS — E669 Obesity, unspecified: Secondary | ICD-10-CM

## 2013-11-10 DIAGNOSIS — I2510 Atherosclerotic heart disease of native coronary artery without angina pectoris: Secondary | ICD-10-CM

## 2013-11-10 DIAGNOSIS — Z9861 Coronary angioplasty status: Secondary | ICD-10-CM

## 2013-11-10 LAB — LIPID PANEL
CHOL/HDL RATIO: 4
CHOLESTEROL: 151 mg/dL (ref 0–200)
HDL: 42.5 mg/dL (ref >39.00)
LDL Cholesterol: 88 mg/dL (ref 0–99)
NonHDL: 108.5
TRIGLYCERIDES: 101 mg/dL (ref 0.0–149.0)
VLDL: 20.2 mg/dL (ref 0.0–40.0)

## 2013-11-10 NOTE — Patient Instructions (Signed)
You can stop Plavix at the end of July.   Your physician recommends that you have lab today--Lipid profile.  Your physician wants you to follow-up in: 1 year with Dr Aundra Dubin. (June 2016). You will receive a reminder letter in the mail two months in advance. If you don't receive a letter, please call our office to schedule the follow-up appointment.

## 2013-11-11 ENCOUNTER — Telehealth: Payer: Self-pay | Admitting: Cardiology

## 2013-11-11 DIAGNOSIS — E669 Obesity, unspecified: Secondary | ICD-10-CM | POA: Insufficient documentation

## 2013-11-11 NOTE — Telephone Encounter (Signed)
New message ° ° ° ° ° °Returning a nurses call °

## 2013-11-11 NOTE — Progress Notes (Signed)
Patient ID: Abigail Garcia, female   DOB: 1971/01/21, 43 y.o.   MRN: [id number] PCP: Dr. Algis Liming  43 yo with history of rheumatoid arthritis presented in 6/14 with chest pain and was found to have NSTEMI.  LHC showed thrombus in the mid LAD.  Patient was put on heparin, Brilinta, and Integrilin gtts for 2 days and cath was repeated, showing persistence of thrombus. Aspiration thrombectomy and PTCA were then done.  The patient was initially placed on Brilinta but developed significant dyspnea so this was stopped and Plavix was begun.  Since the switch to Plavix, dyspnea has resolved.  Patient had atypical chest pain in 3/15 and had ETT with 3 minutes exercise (poor exercise tolerance) but no chest pain or ischemic ECG changes.   Since that time, she has tried to exercise more.  She is now walking for about 3 miles a day.  No chest pain or exertional dyspnea. Rheumatoid arthritis has been relatively quiescent.   Labs (6/14): ESR 49, lupus anticoagulant negative, anticardiolipin antibody negative, factor V Leiden negative, Protein C and S levels normal. Labs (7/14): K 3.7, creatinine 0.68, HCT 27.8 Labs (9/14): LDL 79, HDL 47 Labs (4/15): K 3.8, creatinine 0.8  PMH: 1. Rheumatoid arthritis: Currently on Arava 2. Fe deficiency anemia thought to be related to heavy menses.  3. Hyperlipidemia 4. CAD: NSTEMI 6/14 with LHC showing thrombus in the mLAD with 80-90% stenosis.   Patient was treated with heparin and integrilin gtts for 2 days then repeat LHC was done.  This showed the clot was still present.  The patient then underwent aspiration thrombectomy and PTCA.  Patient had dyspnea with Brilinta.  Echo (6/14) with EF 55-60%.  ETT (3/15) with poor exercise tolerance (3') but no chest pain or ischemic ECG changes.  5. Obesity  SH: Lives in [place], nonsmoker, currently unemployed, has children.   FH: No CAD that she knows of.  ROS: All systems reviewed and negative except as per HPI.   Current  Outpatient Prescriptions  Medication Sig Dispense Refill  . aspirin 81 MG EC tablet Take 1 tablet (81 mg total) by mouth daily.  30 tablet  11  . atorvastatin (LIPITOR) 80 MG tablet Take 1 tablet (80 mg total) by mouth daily at 6 PM.  30 tablet  11  . clopidogrel (PLAVIX) 75 MG tablet Take 1 tablet (75 mg total) by mouth daily with breakfast.  30 tablet  11  . ferrous sulfate 325 (65 FE) MG tablet Take 1 tablet (325 mg total) by mouth 3 (three) times daily with meals.  90 tablet  11  . leflunomide (ARAVA) 10 MG tablet Take 10 mg by mouth daily.      . metoprolol tartrate (LOPRESSOR) 25 MG tablet Take 0.5 tablets (12.5 mg total) by mouth 2 (two) times daily.  30 tablet  11  . pantoprazole (PROTONIX) 40 MG tablet Take 1 tablet (40 mg total) by mouth daily. For 3-4 weeks, then as needed for indigestion.  30 tablet  2  . predniSONE (DELTASONE) 5 MG tablet Take 5 mg by mouth daily with breakfast.      . ramipril (ALTACE) 2.5 MG capsule Take 1 capsule (2.5 mg total) by mouth daily.  30 capsule  6   No current facility-administered medications for this visit.    BP 104/74  Pulse 96  Ht _0  (1.6 m)  Wt 251 lb 12.8 oz (114.216 kg)  BMI 44.62 kg/m2 General: Obese, NAD Neck: No JVD, no thyromegaly or  thyroid nodule.  Lungs: Clear to auscultation bilaterally with normal respiratory effort. CV: Nondisplaced PMI.  Heart regular S1/S2, no S3/S4, no murmur.  No peripheral edema.  No carotid bruit.  Normal pedal pulses.  Abdomen: Soft, nontender, no hepatosplenomegaly, no distention.  Skin: Intact without lesions or rashes.  Neurologic: Alert and oriented x 3.  Psych: Normal affect. Extremities: No clubbing or cyanosis.   Assessment/Plan 1. CAD: NSTEMI in 6/14 with thrombus noted in mid LAD.  Status post aspiration thrombectomy and PTCA.  She was only 78 and had no family history of CAD.  Rheumatoid arthritis may have predisposed her to this MI via the inflammatory milieu it creates.  Dyspnea  resolved with switch from Brilinta to Plavix. ETT in 3/15 for atypical chest pain showed poor exercise tolerance but no ischemic ECG changes.  - Continue Plavix x 1 year, can stop at the end of 7/15.  - Continue ASA 81, statin, ramipril, and metoprolol 2. Hyperlipidemia: Check lipids today.  3. Obesity: Patient has started regular exercise.  She also needs to work on her diet.     Loralie Champagne 11/11/2013

## 2013-11-11 NOTE — Telephone Encounter (Signed)
Follow up  ° ° °Patient calling back to speak with nurse  °

## 2013-11-11 NOTE — Telephone Encounter (Signed)
Spoke with patient about recent lab

## 2014-04-04 ENCOUNTER — Encounter: Payer: Self-pay | Admitting: Cardiology

## 2014-04-05 ENCOUNTER — Ambulatory Visit: Payer: Medicaid Other | Admitting: Hematology and Oncology

## 2014-04-05 ENCOUNTER — Other Ambulatory Visit: Payer: Medicaid Other

## 2014-04-13 ENCOUNTER — Other Ambulatory Visit: Payer: Self-pay | Admitting: Hematology and Oncology

## 2014-05-12 ENCOUNTER — Encounter (HOSPITAL_COMMUNITY): Payer: Self-pay | Admitting: Cardiology

## 2014-05-17 ENCOUNTER — Telehealth: Payer: Self-pay | Admitting: *Deleted

## 2014-05-17 ENCOUNTER — Encounter: Payer: Self-pay | Admitting: Hematology and Oncology

## 2014-05-17 NOTE — Telephone Encounter (Signed)
Noted collaborative nurse has sent Dr. Alvy Bimler a message as patient has also left a voicemail for collaborative.  Printed this request and placed on Dr. Calton Dach desk for her review.

## 2014-05-17 NOTE — Telephone Encounter (Signed)
Pt left VM asking if we can add on IV iron on her appt Monday 12/21?  She feels like her iron is low.

## 2014-05-18 ENCOUNTER — Telehealth: Payer: Self-pay | Admitting: *Deleted

## 2014-05-18 NOTE — Telephone Encounter (Signed)
Left message to call.

## 2014-05-18 NOTE — Telephone Encounter (Signed)
Does she want to come in today or tomorrow for labs first and if iron level is low we can add IV iron infusion. However, before you do that, please make sure there is availability at infusion room

## 2014-05-19 ENCOUNTER — Telehealth: Payer: Self-pay | Admitting: *Deleted

## 2014-05-19 NOTE — Telephone Encounter (Signed)
Called pt and left her a VM asking if she can come in today or tomorrow to check her iron level?  If it is low then we can give her Iron on Monday after her MD visit.  Asked her to call nurse back.

## 2014-05-23 ENCOUNTER — Telehealth: Payer: Self-pay | Admitting: Hematology and Oncology

## 2014-05-23 ENCOUNTER — Encounter: Payer: Self-pay | Admitting: Hematology and Oncology

## 2014-05-23 ENCOUNTER — Ambulatory Visit (HOSPITAL_BASED_OUTPATIENT_CLINIC_OR_DEPARTMENT_OTHER): Payer: Medicaid Other

## 2014-05-23 ENCOUNTER — Ambulatory Visit (HOSPITAL_BASED_OUTPATIENT_CLINIC_OR_DEPARTMENT_OTHER): Payer: Medicaid Other | Admitting: Lab

## 2014-05-23 ENCOUNTER — Telehealth: Payer: Self-pay | Admitting: *Deleted

## 2014-05-23 ENCOUNTER — Ambulatory Visit (HOSPITAL_BASED_OUTPATIENT_CLINIC_OR_DEPARTMENT_OTHER): Payer: Medicaid Other | Admitting: Hematology and Oncology

## 2014-05-23 VITALS — BP 129/77 | HR 91 | Temp 98.0°F | Resp 20 | Ht 63.0 in | Wt 254.1 lb

## 2014-05-23 DIAGNOSIS — D473 Essential (hemorrhagic) thrombocythemia: Secondary | ICD-10-CM

## 2014-05-23 DIAGNOSIS — D509 Iron deficiency anemia, unspecified: Secondary | ICD-10-CM

## 2014-05-23 DIAGNOSIS — D75839 Thrombocytosis, unspecified: Secondary | ICD-10-CM

## 2014-05-23 DIAGNOSIS — I2510 Atherosclerotic heart disease of native coronary artery without angina pectoris: Secondary | ICD-10-CM

## 2014-05-23 DIAGNOSIS — M069 Rheumatoid arthritis, unspecified: Secondary | ICD-10-CM

## 2014-05-23 DIAGNOSIS — Z9861 Coronary angioplasty status: Secondary | ICD-10-CM

## 2014-05-23 LAB — CBC & DIFF AND RETIC
BASO%: 1.1 % (ref 0.0–2.0)
BASOS ABS: 0.1 10*3/uL (ref 0.0–0.1)
EOS ABS: 0.1 10*3/uL (ref 0.0–0.5)
EOS%: 1.4 % (ref 0.0–7.0)
HEMATOCRIT: 30.6 % — AB (ref 34.8–46.6)
HEMOGLOBIN: 8.2 g/dL — AB (ref 11.6–15.9)
Immature Retic Fract: 25.2 % — ABNORMAL HIGH (ref 1.60–10.00)
LYMPH%: 29.9 % (ref 14.0–49.7)
MCH: 17.4 pg — AB (ref 25.1–34.0)
MCHC: 26.8 g/dL — ABNORMAL LOW (ref 31.5–36.0)
MCV: 64.8 fL — AB (ref 79.5–101.0)
MONO#: 0.6 10*3/uL (ref 0.1–0.9)
MONO%: 7.7 % (ref 0.0–14.0)
NEUT%: 59.9 % (ref 38.4–76.8)
NEUTROS ABS: 5 10*3/uL (ref 1.5–6.5)
PLATELETS: 549 10*3/uL — AB (ref 145–400)
RBC: 4.72 10*6/uL (ref 3.70–5.45)
RDW: 19.1 % — ABNORMAL HIGH (ref 11.2–14.5)
RETIC CT ABS: 122.72 10*3/uL — AB (ref 33.70–90.70)
Retic %: 2.6 % — ABNORMAL HIGH (ref 0.70–2.10)
WBC: 8.3 10*3/uL (ref 3.9–10.3)
lymph#: 2.5 10*3/uL (ref 0.9–3.3)

## 2014-05-23 LAB — FERRITIN CHCC: Ferritin: <4 ng/ml — ABNORMAL LOW (ref 9–269)

## 2014-05-23 MED ORDER — ALTEPLASE 2 MG IJ SOLR
2.0000 mg | Freq: Once | INTRAMUSCULAR | Status: DC | PRN
  Filled 2014-05-23: qty 2

## 2014-05-23 MED ORDER — FERUMOXYTOL INJECTION 510 MG/17 ML
510.0000 mg | Freq: Once | INTRAVENOUS | Status: AC
  Administered 2014-05-23: 510 mg via INTRAVENOUS
  Filled 2014-05-23: qty 17

## 2014-05-23 MED ORDER — SODIUM CHLORIDE 0.9 % IV SOLN
Freq: Once | INTRAVENOUS | Status: AC
  Administered 2014-05-23: 15:00:00 via INTRAVENOUS

## 2014-05-23 NOTE — Assessment & Plan Note (Signed)
She is on multiple medications for this. According to the patient, her arthritis symptoms are stable

## 2014-05-23 NOTE — Telephone Encounter (Signed)
Per staff message and POF I have scheduled appts. Advised scheduler of appts. JMW  

## 2014-05-23 NOTE — Assessment & Plan Note (Signed)
This is likely reactive in nature. This will resolve once her iron deficiency improves.

## 2014-05-23 NOTE — Patient Instructions (Signed)

## 2014-05-23 NOTE — Progress Notes (Signed)
[facility] OFFICE PROGRESS NOTE  [name], MD SUMMARY OF HEMATOLOGIC HISTORY: She was found to have abnormal CBC from routine cbc. She says she was "anemic all her life". Review of her past CBC showed her hemoglobin ranges from 7.6 to 9.0. She denies recent chest pain on exertion, shortness of breath on minimal exertion, pre-syncopal episodes, or palpitations. She does has chronic fatigue and bilateral leg cramps. Last year, she suffered from NSTEMI exacerbated from severe anemia. She had not noticed any recent bleeding such as epistaxis, hematuria or hematochezia. She had history of menorrhagia in the past. The patient denies over the counter NSAID ingestion. She was on prednisone for chronic arthritis for many years. She is on antiplatelets agents for history of heart attack. On 08/31/2013 & 10/05/13, she was given iron infusion. Her pica resolved.  INTERVAL HISTORY: Abigail Garcia 43 y.o. female returns for further follow-up. She complained of pica again. She denies menorrhagia. The patient denies any recent signs or symptoms of bleeding such as spontaneous epistaxis, hematuria or hematochezia. She complained of fatigue. I have reviewed the past medical history, past surgical history, social history and family history with the patient and they are unchanged from previous note.  ALLERGIES:  has No Known Allergies.  MEDICATIONS:  Current Outpatient Prescriptions  Medication Sig Dispense Refill  . aspirin 81 MG EC tablet Take 1 tablet (81 mg total) by mouth daily. 30 tablet 11  . atorvastatin (LIPITOR) 80 MG tablet Take 1 tablet (80 mg total) by mouth daily at 6 PM. 30 tablet 11  . clopidogrel (PLAVIX) 75 MG tablet Take 1 tablet (75 mg total) by mouth daily with breakfast. 30 tablet 11  . ferrous sulfate 325 (65 FE) MG tablet Take 1 tablet (325 mg total) by mouth 3 (three) times daily with meals. 90 tablet 11  . leflunomide (ARAVA) 10 MG tablet Take 10 mg by mouth daily.     . metoprolol tartrate (LOPRESSOR) 25 MG tablet Take 0.5 tablets (12.5 mg total) by mouth 2 (two) times daily. 30 tablet 11  . pantoprazole (PROTONIX) 40 MG tablet Take 1 tablet (40 mg total) by mouth daily. For 3-4 weeks, then as needed for indigestion. 30 tablet 2  . predniSONE (DELTASONE) 5 MG tablet Take 5 mg by mouth daily with breakfast.    . ramipril (ALTACE) 2.5 MG capsule Take 1 capsule (2.5 mg total) by mouth daily. 30 capsule 6   No current facility-administered medications for this visit.     REVIEW OF SYSTEMS:   Constitutional: Denies fevers, chills or night sweats Eyes: Denies blurriness of vision Ears, nose, mouth, throat, and face: Denies mucositis or sore throat Respiratory: Denies cough, dyspnea or wheezes Cardiovascular: Denies palpitation, chest discomfort or lower extremity swelling Gastrointestinal:  Denies nausea, heartburn or change in bowel habits Skin: Denies abnormal skin rashes Lymphatics: Denies new lymphadenopathy or easy bruising Neurological:Denies numbness, tingling or new weaknesses Behavioral/Psych: Mood is stable, no new changes  All other systems were reviewed with the patient and are negative.  PHYSICAL EXAMINATION: ECOG PERFORMANCE STATUS: 1 - Symptomatic but completely ambulatory  Filed Vitals:   05/23/14 1351  BP: 129/77  Pulse: 91  Temp: 98 F (36.7 C)  Resp: 20   Filed Weights   05/23/14 1351  Weight: 254 lb 1.6 oz (115.259 kg)    GENERAL:alert, no distress and comfortable. She is morbidly obese SKIN: skin color, texture, turgor are normal, no rashes or significant lesions EYES: normal, Conjunctiva are pale and  non-injected, sclera clear OROPHARYNX:no exudate, no erythema and lips, buccal mucosa, and tongue normal  NECK: supple, thyroid normal size, non-tender, without nodularity LYMPH:  no palpable lymphadenopathy in the cervical, axillary or inguinal LUNGS: clear to auscultation and percussion with normal breathing  effort HEART: regular rate & rhythm and no murmurs and no lower extremity edema ABDOMEN:abdomen soft, non-tender and normal bowel sounds Musculoskeletal:no cyanosis of digits and no clubbing  NEURO: alert & oriented x 3 with fluent speech, no focal motor/sensory deficits  LABORATORY DATA:  I have reviewed the data as listed Results for orders placed or performed in visit on 05/23/14 (from the past 48 hour(s))  CBC & Diff and Retic     Status: Abnormal   Collection Time: 05/23/14  1:40 PM  Result Value Ref Range   WBC 8.3 3.9 - 10.3 10e3/uL   NEUT# 5.0 1.5 - 6.5 10e3/uL   HGB 8.2 (L) 11.6 - 15.9 g/dL   HCT 30.6 (L) 34.8 - 46.6 %   Platelets 549 (H) 145 - 400 10e3/uL   MCV 64.8 (L) 79.5 - 101.0 fL   MCH 17.4 (L) 25.1 - 34.0 pg   MCHC 26.8 (L) 31.5 - 36.0 g/dL   RBC 4.72 3.70 - 5.45 10e6/uL   RDW 19.1 (H) 11.2 - 14.5 %   lymph# 2.5 0.9 - 3.3 10e3/uL   MONO# 0.6 0.1 - 0.9 10e3/uL   Eosinophils Absolute 0.1 0.0 - 0.5 10e3/uL   Basophils Absolute 0.1 0.0 - 0.1 10e3/uL   NEUT% 59.9 38.4 - 76.8 %   LYMPH% 29.9 14.0 - 49.7 %   MONO% 7.7 0.0 - 14.0 %   EOS% 1.4 0.0 - 7.0 %   BASO% 1.1 0.0 - 2.0 %   Retic % 2.60 (H) 0.70 - 2.10 %   Retic Ct Abs 122.72 (H) 33.70 - 90.70 10e3/uL   Immature Retic Fract 25.20 (H) 1.60 - 10.00 %    Lab Results  Component Value Date   WBC 8.3 05/23/2014   HGB 8.2* 05/23/2014   HCT 30.6* 05/23/2014   MCV 64.8* 05/23/2014   PLT 549* 05/23/2014   ASSESSMENT & PLAN:  Anemia, iron deficiency This is due to GI bleed until proven otherwise. I recommend iv iron infusion X 2 The most likely cause of her anemia is due to chronic blood loss. We discussed some of the risks, benefits, and alternatives of intravenous iron infusions. The patient is symptomatic from anemia and the iron level is critically low. She tolerated oral iron supplement poorly and desires to achieved higher levels of iron faster for adequate hematopoesis. Some of the side-effects to be expected  including risks of infusion reactions, phlebitis, headaches, nausea and fatigue.  The patient is willing to proceed. Patient education material was dispensed.  Goal is to keep ferritin level greater than 100. I also recommend GI evaluation. I will bring her back in 3 months with repeat blood work 1 week prior to her appointment. She may need more IV iron in the future   Rheumatoid arthritis She is on multiple medications for this. According to the patient, her arthritis symptoms are stable  Thrombocytosis This is likely reactive in nature. This will resolve once her iron deficiency improves.  CAD S/P percutaneous coronary angioplasty She has no angina today. There is no indication for blood transfusion now. She will remain on anti-platelet agents. I will consult Gi to exclude GI bleed as a cause of her iron deficiency anemia   All questions were  answered. The patient knows to call the clinic with any problems, questions or concerns. No barriers to learning was detected.  I spent 30 minutes counseling the patient face to face. The total time spent in the appointment was 40 minutes and more than 50% was on counseling.     [facility], [place], MD 05/23/2014 2:23 PM

## 2014-05-23 NOTE — Telephone Encounter (Signed)
Gave avs & cal for March. °

## 2014-05-23 NOTE — Assessment & Plan Note (Signed)
She has no angina today. There is no indication for blood transfusion now. She will remain on anti-platelet agents. I will consult Gi to exclude GI bleed as a cause of her iron deficiency anemia

## 2014-05-23 NOTE — Assessment & Plan Note (Signed)
This is due to GI bleed until proven otherwise. I recommend iv iron infusion X 2 The most likely cause of her anemia is due to chronic blood loss. We discussed some of the risks, benefits, and alternatives of intravenous iron infusions. The patient is symptomatic from anemia and the iron level is critically low. She tolerated oral iron supplement poorly and desires to achieved higher levels of iron faster for adequate hematopoesis. Some of the side-effects to be expected including risks of infusion reactions, phlebitis, headaches, nausea and fatigue.  The patient is willing to proceed. Patient education material was dispensed.  Goal is to keep ferritin level greater than 100. I also recommend GI evaluation. I will bring her back in 3 months with repeat blood work 1 week prior to her appointment. She may need more IV iron in the future

## 2014-05-31 ENCOUNTER — Ambulatory Visit (HOSPITAL_BASED_OUTPATIENT_CLINIC_OR_DEPARTMENT_OTHER): Payer: Medicaid Other

## 2014-05-31 ENCOUNTER — Other Ambulatory Visit: Payer: Self-pay | Admitting: *Deleted

## 2014-05-31 DIAGNOSIS — D509 Iron deficiency anemia, unspecified: Secondary | ICD-10-CM

## 2014-05-31 MED ORDER — SODIUM CHLORIDE 0.9 % IV SOLN
510.0000 mg | Freq: Once | INTRAVENOUS | Status: AC
  Administered 2014-05-31: 510 mg via INTRAVENOUS
  Filled 2014-05-31: qty 17

## 2014-05-31 MED ORDER — SODIUM CHLORIDE 0.9 % IV SOLN
Freq: Once | INTRAVENOUS | Status: AC
  Administered 2014-05-31: 16:00:00 via INTRAVENOUS

## 2014-05-31 NOTE — Patient Instructions (Signed)

## 2014-06-01 ENCOUNTER — Other Ambulatory Visit: Payer: Medicaid Other

## 2014-06-01 ENCOUNTER — Ambulatory Visit (INDEPENDENT_AMBULATORY_CARE_PROVIDER_SITE_OTHER): Payer: Medicaid Other | Admitting: Physician Assistant

## 2014-06-01 ENCOUNTER — Encounter: Payer: Self-pay | Admitting: Physician Assistant

## 2014-06-01 VITALS — BP 102/76 | HR 72 | Ht 63.0 in | Wt 256.0 lb

## 2014-06-01 DIAGNOSIS — D509 Iron deficiency anemia, unspecified: Secondary | ICD-10-CM

## 2014-06-01 DIAGNOSIS — Z7901 Long term (current) use of anticoagulants: Secondary | ICD-10-CM

## 2014-06-01 MED ORDER — MOVIPREP 100 G PO SOLR: 1.0000 | ORAL | Status: DC

## 2014-06-01 NOTE — Progress Notes (Signed)
Patient ID: Anthonia Monger, female   DOB: 05/23/1971, 43 y.o.   MRN: [id number]   Subjective:    Patient ID: Shelly Flatten, female    DOB: 1971/02/09, 43 y.o.   MRN: [id number]  HPI Guila is a pleasant 43 year old African-American female referred today by Dr. Barkley Bruns /oncology for evaluation of chronic iron deficiency anemia with PICA. Patient has a long history of anemia dating back several years and hemoglobin has generally been in the 7.5-9 range. She has undergone prior IV iron infusions. Patient states that she has had problems with anemia since her early teens but not to the extent that she has had over the past couple of years. She had a non-ST MI in June 2014 which is felt possibly to have been precipitated by anemia. She underwent a thrombectomy and angioplasty and has been on Plavix and aspirin since. She is being followed by Dr. Benjamine Mola. History of rheumatoid arthritis. Patient states that since being on blood thinners she has been having very have re-menstrual periods with clotting. She says her periods last about 7 days and have been very regular. She did see a gynecologist in 2014 Dr. Vickii Chafe constant and had undergone endometrial biopsy which was benign. Review of her records shows a pelvic ultrasound in 2014 showed multiple fibroids. Patient states that she's been told by Dr. Benjamine Mola that she is going to be weaned off of Plavix and will not need to stay on this long-term. Patient has absolutely no GI symptoms. Specifically no problems with abdominal discomfort changes in bowel habits constipation diarrhea melena or hematochezia. No upper GI symptoms. Line Family history is negative for GI disease Patient does not believe that she's been tested for celiac disease or thalassemia. Most recent labs 05/23/2014 hemoglobin 8.2 hematocrit of 30.6 platelets 549 and ferritin less than 4.  Review of Systems Pertinent positive and negative review of systems were noted in the above HPI section.  All  other review of systems was otherwise negative.  Outpatient Encounter Prescriptions as of 06/01/2014  Medication Sig  . aspirin 81 MG EC tablet Take 1 tablet (81 mg total) by mouth daily.  Marland Kitchen atorvastatin (LIPITOR) 80 MG tablet Take 1 tablet (80 mg total) by mouth daily at 6 PM.  . clopidogrel (PLAVIX) 75 MG tablet Take 1 tablet (75 mg total) by mouth daily with breakfast.  . ferrous sulfate 325 (65 FE) MG tablet Take 1 tablet (325 mg total) by mouth 3 (three) times daily with meals.  Marland Kitchen leflunomide (ARAVA) 10 MG tablet Take 10 mg by mouth daily.  . metoprolol tartrate (LOPRESSOR) 25 MG tablet Take 0.5 tablets (12.5 mg total) by mouth 2 (two) times daily.  . pantoprazole (PROTONIX) 40 MG tablet Take 1 tablet (40 mg total) by mouth daily. For 3-4 weeks, then as needed for indigestion.  . predniSONE (DELTASONE) 5 MG tablet Take 5 mg by mouth daily with breakfast.  . ramipril (ALTACE) 2.5 MG capsule Take 1 capsule (2.5 mg total) by mouth daily.  Marland Kitchen MOVIPREP 100 G SOLR Take 1 kit (200 g total) by mouth as directed.   No Known Allergies Patient Active Problem List   Diagnosis Date Noted  . Obesity 11/11/2013  . Leukocytosis, unspecified 08/31/2013  . Thrombocytosis 08/31/2013  . Anemia 08/20/2013  . Ganglion cyst of wrist 06/12/2013  . Health care maintenance 06/12/2013  . Menorrhagia 12/28/2012  . Hyperlipidemia 12/03/2012  . CAD S/P percutaneous coronary angioplasty 11/30/2012  . Rheumatoid arthritis 11/14/2012  . Anemia, iron deficiency  11/14/2012   History   Social History  . Marital Status: Single    Spouse Name: N/A    Number of Children: N/A  . Years of Education: N/A   Occupational History  . Not on file.   Social History Main Topics  . Smoking status: Never Smoker   . Smokeless tobacco: Never Used  . Alcohol Use: No  . Drug Use: No  . Sexual Activity: Not on file   Other Topics Concern  . Not on file   Social History Narrative    Ms. Abramo family history  includes Heart disease in an other family member.      Objective:    Filed Vitals:   06/01/14 0951  BP: 102/76  Pulse: 72    Physical Exam  well-developed young African-American female in no acute distress, pleasant vitals as above height 5 foot 3 weight 256 on a BMI of 45. HEENT; nontraumatic normocephalic EOMI PERRLA sclera anicteric, Supple; no JVD, Cardiovascular ;regular rate and rhythm with S1-S2 no murmur or gallop, Pulmonary; clear bilaterally, Abdomen; obese soft nontender nondistended bowel sounds are active no palpable mass or hepatosplenomegaly, Rectal; exam not done, Ext; no clubbing cyanosis or edema skin warm and dry, Psych; mood and affect appropriate       Assessment & Plan:   #43 43 year old African-American female with a chronic anemia markedly low MCV and iron deficiency. This has been chronic and worse over the past 2 years. I suspect worsening over the past couple of years is secondary to antiplatelet therapy with Plavix and aspirin and secondary menorrhagia likely secondary to uterine fibroids. We will rule out occult GI blood loss though feel less likely Also rule out lithemia and celiac disease #2 coronary artery disease status post non-ST MI June 2014 with thrombectomy and angioplasty #3 current antiplatelet therapy with Plavix and aspirin #4 rheumatoid arthritis  Plan; celiac panel, hemoglobin electrophoresis Hemosure stool occult blood testing Will schedule for colonoscopy and EGD with Dr. Olevia Perches- procedures discussed in detail with the patient and she is agreeable to proceed. We will obtain consent from her cardiologist Dr. Marigene Ehlers for patient to hold Plavix for 5-7 days prior to the procedure. I have also asked patient to get a return office visit with GYN Dr. Mora Bellman for repeat evaluation and consideration of treatment of uterine fibroids and/or hysterectomy. Hopefully if she can come off Plavix  Completely  this will significantlydecrease her  menorrhagia.  Edan Juday S Joseluis Alessio PA-C 06/01/2014

## 2014-06-01 NOTE — Progress Notes (Signed)
Reviewed. Pt has heavy menses which is likely causing severe iron deficiency. I agree with r/o GI lesion by EGD/colon

## 2014-06-01 NOTE — Patient Instructions (Addendum)
Please go to the basement level to have your labs drawn and also a stool card test.  Make appointment to see Dr. Mora Bellman- GYN MD regarding the heavy vaginal bleeding.  You have been scheduled for an endoscopy and colonoscopy. Please follow the written instructions given to you at your visit today. Please pick up your prep at the pharmacy within the next 1-3 days. If you use inhalers (even only as needed), please bring them with you on the day of your procedure.  We sent a prescription for the Moviprep to [address].

## 2014-06-02 LAB — CELIAC PANEL 10
Endomysial Screen: NEGATIVE
Gliadin IgA: 7 Units (ref <20)
Gliadin IgG: 6 Units (ref <20)
IGA: 228 mg/dL (ref 69–380)
TISSUE TRANSGLUT AB: 3 U/mL (ref <6)
Tissue Transglutaminase Ab, IgA: 1 U/mL (ref <4)

## 2014-06-06 LAB — HEMOGLOBINOPATHY EVALUATION
HGB A2 QUANT: 2.1 % — AB (ref 2.2–3.2)
Hemoglobin Other: 0 %
Hgb A: 97.9 % — ABNORMAL HIGH (ref 96.8–97.8)
Hgb F Quant: 0 % (ref 0.0–2.0)
Hgb S Quant: 0 %

## 2014-06-13 ENCOUNTER — Telehealth: Payer: Self-pay | Admitting: Hematology and Oncology

## 2014-06-13 NOTE — Telephone Encounter (Signed)
lvm for pt regarding to 3.21 appt moved to 3.22 due to MD on pal....mailed pt appt sched/avs and letter

## 2014-07-06 ENCOUNTER — Encounter: Payer: Medicaid Other | Admitting: Internal Medicine

## 2014-08-08 ENCOUNTER — Encounter: Payer: Self-pay | Admitting: *Deleted

## 2014-08-15 ENCOUNTER — Other Ambulatory Visit (HOSPITAL_BASED_OUTPATIENT_CLINIC_OR_DEPARTMENT_OTHER): Payer: Medicaid Other

## 2014-08-15 DIAGNOSIS — D509 Iron deficiency anemia, unspecified: Secondary | ICD-10-CM

## 2014-08-15 LAB — CBC & DIFF AND RETIC
BASO%: 0.9 % (ref 0.0–2.0)
Basophils Absolute: 0.1 10*3/uL (ref 0.0–0.1)
EOS ABS: 0.3 10*3/uL (ref 0.0–0.5)
EOS%: 3.8 % (ref 0.0–7.0)
HEMATOCRIT: 28 % — AB (ref 34.8–46.6)
HGB: 8.3 g/dL — ABNORMAL LOW (ref 11.6–15.9)
Immature Retic Fract: 26.2 % — ABNORMAL HIGH (ref 1.60–10.00)
LYMPH%: 28.3 % (ref 14.0–49.7)
MCH: 22.7 pg — AB (ref 25.1–34.0)
MCHC: 29.6 g/dL — ABNORMAL LOW (ref 31.5–36.0)
MCV: 76.5 fL — AB (ref 79.5–101.0)
MONO#: 0.9 10*3/uL (ref 0.1–0.9)
MONO%: 9.4 % (ref 0.0–14.0)
NEUT#: 5.2 10*3/uL (ref 1.5–6.5)
NEUT%: 57.6 % (ref 38.4–76.8)
Platelets: 491 10*3/uL — ABNORMAL HIGH (ref 145–400)
RBC: 3.66 10*6/uL — ABNORMAL LOW (ref 3.70–5.45)
RDW: 19.9 % — ABNORMAL HIGH (ref 11.2–14.5)
RETIC CT ABS: 129.56 10*3/uL — AB (ref 33.70–90.70)
Retic %: 3.54 % — ABNORMAL HIGH (ref 0.70–2.10)
WBC: 9 10*3/uL (ref 3.9–10.3)
lymph#: 2.6 10*3/uL (ref 0.9–3.3)

## 2014-08-15 LAB — FERRITIN CHCC: Ferritin: 6 ng/ml — ABNORMAL LOW (ref 9–269)

## 2014-08-15 LAB — IRON AND TIBC CHCC
%SAT: 4 % — ABNORMAL LOW (ref 21–57)
IRON: 14 ug/dL — AB (ref 41–142)
TIBC: 389 ug/dL (ref 236–444)
UIBC: 374 ug/dL (ref 120–384)

## 2014-08-16 LAB — SEDIMENTATION RATE: Sed Rate: 28 mm/hr — ABNORMAL HIGH (ref 0–20)

## 2014-08-22 ENCOUNTER — Ambulatory Visit: Payer: Medicaid Other

## 2014-08-22 ENCOUNTER — Ambulatory Visit: Payer: Medicaid Other | Admitting: Hematology and Oncology

## 2014-08-23 ENCOUNTER — Ambulatory Visit: Payer: Medicaid Other

## 2014-08-23 ENCOUNTER — Ambulatory Visit (HOSPITAL_BASED_OUTPATIENT_CLINIC_OR_DEPARTMENT_OTHER): Payer: Medicaid Other | Admitting: Hematology and Oncology

## 2014-08-23 ENCOUNTER — Telehealth: Payer: Self-pay | Admitting: Hematology and Oncology

## 2014-08-23 ENCOUNTER — Encounter: Payer: Self-pay | Admitting: Hematology and Oncology

## 2014-08-23 VITALS — BP 119/85 | HR 94 | Temp 98.6°F | Resp 20 | Ht 63.0 in | Wt 259.4 lb

## 2014-08-23 DIAGNOSIS — D509 Iron deficiency anemia, unspecified: Secondary | ICD-10-CM | POA: Diagnosis not present

## 2014-08-23 NOTE — Assessment & Plan Note (Signed)
Despite severe anemia, she is not symptomatic. The patient does not want intravenous iron infusion because she is busy with her schedule. After prolonged discussion, she is in agreement to get infusion on April 7, April 21 on April 28. I plan to recheck her blood work and iron study on April 28. The patient did not have significant benefit from prior infusion treatment in December, and hence, I plan to give her 3 infusion treatment this time and recheck in 2 months instead of waiting 3 months. The most likely cause of her anemia is due to chronic blood loss/malabsorption syndrome. We discussed some of the risks, benefits, and alternatives of intravenous iron infusions. The patient is symptomatic from anemia and the iron level is critically low. She tolerated oral iron supplement poorly and desires to achieved higher levels of iron faster for adequate hematopoesis. Some of the side-effects to be expected including risks of infusion reactions, phlebitis, headaches, nausea and fatigue.  The patient is willing to proceed. Patient education material was dispensed.  Goal is to keep ferritin level greater than 50

## 2014-08-23 NOTE — Progress Notes (Signed)
Abigail Garcia  Abigail Gallant, MD SUMMARY OF HEMATOLOGIC HISTORY:  She was found to have abnormal CBC from routine cbc. She says she was "anemic all her life". Review of her past CBC showed her hemoglobin ranges from 7.6 to 9.0. She denies recent chest pain on exertion, shortness of breath on minimal exertion, pre-syncopal episodes, or palpitations. She does has chronic fatigue and bilateral leg cramps. Last year, she suffered from NSTEMI exacerbated from severe anemia. She had not noticed any recent bleeding such as epistaxis, hematuria or hematochezia. She had history of menorrhagia in the past. The patient denies over the counter NSAID ingestion. She was on prednisone for chronic arthritis for many years. She is on antiplatelets agents for history of heart attack. On 08/31/2013, 10/05/13 05/23/14 & 05/31/14, she was given iron infusion. Her pica resolved.  INTERVAL HISTORY: Abigail Garcia 44 y.o. female returns for further follow-up. She complained of menorrhagia. She continues to take aspirin and Plavix for her heart condition. The patient denies any recent signs or symptoms of bleeding such as spontaneous epistaxis, hematuria or hematochezia.   I have reviewed the past medical history, past surgical history, social history and family history with the patient and they are unchanged from previous Garcia.  ALLERGIES:  has No Known Allergies.  MEDICATIONS:  Current Outpatient Prescriptions  Medication Sig Dispense Refill  . aspirin 81 MG EC tablet Take 1 tablet (81 mg total) by mouth daily. 30 tablet 11  . atorvastatin (LIPITOR) 80 MG tablet Take 1 tablet (80 mg total) by mouth daily at 6 PM. 30 tablet 11  . clopidogrel (PLAVIX) 75 MG tablet Take 1 tablet (75 mg total) by mouth daily with breakfast. 30 tablet 11  . ferrous sulfate 325 (65 FE) MG tablet Take 1 tablet (325 mg total) by mouth 3 (three) times daily with meals. 90 tablet 11  . leflunomide (ARAVA) 10  MG tablet Take 10 mg by mouth daily.    . metoprolol tartrate (LOPRESSOR) 25 MG tablet Take 0.5 tablets (12.5 mg total) by mouth 2 (two) times daily. 30 tablet 11  . MOVIPREP 100 G SOLR Take 1 kit (200 g total) by mouth as directed. 1 kit 0  . pantoprazole (PROTONIX) 40 MG tablet Take 1 tablet (40 mg total) by mouth daily. For 3-4 weeks, then as needed for indigestion. 30 tablet 2  . predniSONE (DELTASONE) 5 MG tablet Take 5 mg by mouth daily with breakfast.    . ramipril (ALTACE) 2.5 MG capsule Take 1 capsule (2.5 mg total) by mouth daily. 30 capsule 6   No current facility-administered medications for this visit.     REVIEW OF SYSTEMS:   Constitutional: Denies fevers, chills or night sweats Eyes: Denies blurriness of vision Ears, nose, mouth, throat, and face: Denies mucositis or sore throat Respiratory: Denies cough, dyspnea or wheezes Cardiovascular: Denies palpitation, chest discomfort or lower extremity swelling Gastrointestinal:  Denies nausea, heartburn or change in bowel habits Skin: Denies abnormal skin rashes Lymphatics: Denies new lymphadenopathy or easy bruising Neurological:Denies numbness, tingling or new weaknesses Behavioral/Psych: Mood is stable, no new changes  All other systems were reviewed with the patient and are negative.  PHYSICAL EXAMINATION: ECOG PERFORMANCE STATUS: 0 - Asymptomatic  Filed Vitals:   08/23/14 1348  BP: 119/85  Pulse: 94  Temp: 98.6 F (37 C)  Resp: 20   Filed Weights   08/23/14 1348  Weight: 259 lb 6.4 oz (117.663 kg)    GENERAL:alert, no distress  and comfortable. She is morbidly obese SKIN: skin color, texture, turgor are normal, no rashes or significant lesions EYES: normal, Conjunctiva are pale and non-injected, sclera clear Musculoskeletal:no cyanosis of digits and no clubbing  NEURO: alert & oriented x 3 with fluent speech, no focal motor/sensory deficits  LABORATORY DATA:  I have reviewed the data as listed No results  found for this or any previous visit (from the past 48 hour(s)).  Lab Results  Component Value Date   WBC 9.0 08/15/2014   HGB 8.3* 08/15/2014   HCT 28.0* 08/15/2014   MCV 76.5* 08/15/2014   PLT 491* 08/15/2014    ASSESSMENT & PLAN:  Anemia, iron deficiency Despite severe anemia, she is not symptomatic. The patient does not want intravenous iron infusion because she is busy with her schedule. After prolonged discussion, she is in agreement to get infusion on April 7, April 21 on April 28. I plan to recheck her blood work and iron study on April 28. The patient did not have significant benefit from prior infusion treatment in December, and hence, I plan to give her 3 infusion treatment this time and recheck in 2 months instead of waiting 3 months. The most likely cause of her anemia is due to chronic blood loss/malabsorption syndrome. We discussed some of the risks, benefits, and alternatives of intravenous iron infusions. The patient is symptomatic from anemia and the iron level is critically low. She tolerated oral iron supplement poorly and desires to achieved higher levels of iron faster for adequate hematopoesis. Some of the side-effects to be expected including risks of infusion reactions, phlebitis, headaches, nausea and fatigue.  The patient is willing to proceed. Patient education material was dispensed.  Goal is to keep ferritin level greater than 50     All questions were answered. The patient knows to call the clinic with any problems, questions or concerns. No barriers to learning was detected.  I spent 15 minutes counseling the patient face to face. The total time spent in the appointment was 20 minutes and more than 50% was on counseling.     [facility], Larah Kuntzman, MD 3/22/20164:17 PM

## 2014-08-23 NOTE — Telephone Encounter (Signed)
gave and printed appt sched and avs for pt for April and June...sed added tx...Marland Kitchenper pt MD did not sched 4th iron due to pt work sched.Marland KitchenMarland Kitchen

## 2014-08-30 ENCOUNTER — Ambulatory Visit: Payer: Medicaid Other

## 2014-09-08 ENCOUNTER — Ambulatory Visit: Payer: Medicaid Other

## 2014-09-09 ENCOUNTER — Ambulatory Visit (HOSPITAL_BASED_OUTPATIENT_CLINIC_OR_DEPARTMENT_OTHER): Payer: Medicaid Other

## 2014-09-09 ENCOUNTER — Telehealth: Payer: Self-pay | Admitting: Hematology and Oncology

## 2014-09-09 DIAGNOSIS — D500 Iron deficiency anemia secondary to blood loss (chronic): Secondary | ICD-10-CM

## 2014-09-09 DIAGNOSIS — N920 Excessive and frequent menstruation with regular cycle: Secondary | ICD-10-CM | POA: Diagnosis not present

## 2014-09-09 DIAGNOSIS — D509 Iron deficiency anemia, unspecified: Secondary | ICD-10-CM

## 2014-09-09 MED ORDER — ALTEPLASE 2 MG IJ SOLR
2.0000 mg | Freq: Once | INTRAMUSCULAR | Status: DC | PRN
  Filled 2014-09-09: qty 2

## 2014-09-09 MED ORDER — SODIUM CHLORIDE 0.9 % IV SOLN
Freq: Once | INTRAVENOUS | Status: AC
  Administered 2014-09-09: 15:00:00 via INTRAVENOUS

## 2014-09-09 MED ORDER — SODIUM CHLORIDE 0.9 % IV SOLN
510.0000 mg | Freq: Once | INTRAVENOUS | Status: AC
  Administered 2014-09-09: 510 mg via INTRAVENOUS
  Filled 2014-09-09: qty 17

## 2014-09-09 NOTE — Telephone Encounter (Signed)
s.w. pt and advised on i will call her back if chemo can add her appt today

## 2014-09-09 NOTE — Patient Instructions (Signed)

## 2014-09-09 NOTE — Telephone Encounter (Signed)
s.w. pt and advised on today appt d.t

## 2014-09-12 ENCOUNTER — Other Ambulatory Visit: Payer: Self-pay | Admitting: *Deleted

## 2014-09-12 ENCOUNTER — Telehealth: Payer: Self-pay | Admitting: Cardiology

## 2014-09-12 DIAGNOSIS — I2510 Atherosclerotic heart disease of native coronary artery without angina pectoris: Secondary | ICD-10-CM

## 2014-09-12 DIAGNOSIS — Z9861 Coronary angioplasty status: Principal | ICD-10-CM

## 2014-09-12 MED ORDER — METOPROLOL TARTRATE 25 MG PO TABS: 12.5000 mg | ORAL_TABLET | Freq: Two times a day (BID) | ORAL | Status: DC

## 2014-09-12 NOTE — Telephone Encounter (Signed)
Pt states she has not been taking any of her cardiac medication except aspirin since December.  Pt states she understood she was to discontinue her cardiac medication at the end of December.  Pt states she has had an uneven feeling recently.  Pt denies chest pain/SOB/nausea or other cardiac symptoms except she thought her heart rate was high today. Pt took a metoprolol earlier today and feels better now.   Pt states she is leaving around 3 AM Wed morning for a 10 hour car trip and would like to come to be checked before she leaves on this trip.  Pt advised Dr Aundra Dubin not in the office tomorrow but I will check PA/NP schedule to see if she could be seen tomorrow.  Pt advised I will call her in the morning.

## 2014-09-12 NOTE — Telephone Encounter (Signed)
New Message  Pt wanted to speak w/ Rn about recent changes in health. Per pt- has been feeling "uneven" last few days. Pt was given next avail PA appt but wanted to speak w/ Rn about what to do going forward. Please call back and discuss.

## 2014-09-13 NOTE — Telephone Encounter (Signed)
Pt states she feels better this morning. Pt denies any chest pain/tightness/SOB or other cardiac symptoms today. Pt states that she had a transfusion last Friday, she is now thinking that her symptoms may be related to medical problems requiring transfusion.   I reviewed with Patterson Hammersmith recommended pt follow up with PCP today. Pt states she is comfortable with this recommendation.  Pt advised to keep appt scheduled with Ermalinda Barrios in May, I confirmed appt time with patient.

## 2014-09-22 ENCOUNTER — Ambulatory Visit (HOSPITAL_BASED_OUTPATIENT_CLINIC_OR_DEPARTMENT_OTHER): Payer: Medicaid Other

## 2014-09-22 VITALS — BP 113/69 | HR 86 | Temp 99.0°F | Resp 16

## 2014-09-22 DIAGNOSIS — D509 Iron deficiency anemia, unspecified: Secondary | ICD-10-CM

## 2014-09-22 DIAGNOSIS — N920 Excessive and frequent menstruation with regular cycle: Secondary | ICD-10-CM

## 2014-09-22 DIAGNOSIS — D500 Iron deficiency anemia secondary to blood loss (chronic): Secondary | ICD-10-CM | POA: Diagnosis present

## 2014-09-22 MED ORDER — SODIUM CHLORIDE 0.9 % IV SOLN
510.0000 mg | Freq: Once | INTRAVENOUS | Status: AC
  Administered 2014-09-22: 510 mg via INTRAVENOUS
  Filled 2014-09-22: qty 17

## 2014-09-22 MED ORDER — HEPARIN SOD (PORK) LOCK FLUSH 100 UNIT/ML IV SOLN
250.0000 [IU] | Freq: Once | INTRAVENOUS | Status: DC | PRN
  Filled 2014-09-22: qty 5

## 2014-09-22 MED ORDER — SODIUM CHLORIDE 0.9 % IV SOLN
Freq: Once | INTRAVENOUS | Status: AC
  Administered 2014-09-22: 14:00:00 via INTRAVENOUS

## 2014-09-22 NOTE — Patient Instructions (Signed)

## 2014-09-29 ENCOUNTER — Other Ambulatory Visit: Payer: Medicaid Other

## 2014-09-29 ENCOUNTER — Ambulatory Visit: Payer: Medicaid Other

## 2014-09-30 ENCOUNTER — Ambulatory Visit (AMBULATORY_SURGERY_CENTER): Payer: Medicaid Other | Admitting: Internal Medicine

## 2014-09-30 ENCOUNTER — Encounter: Payer: Self-pay | Admitting: Internal Medicine

## 2014-09-30 VITALS — BP 115/72 | HR 82 | Temp 98.4°F | Resp 17 | Ht 63.0 in | Wt 256.0 lb

## 2014-09-30 DIAGNOSIS — K209 Esophagitis, unspecified without bleeding: Secondary | ICD-10-CM

## 2014-09-30 DIAGNOSIS — D509 Iron deficiency anemia, unspecified: Secondary | ICD-10-CM | POA: Diagnosis not present

## 2014-09-30 MED ORDER — RANITIDINE HCL 150 MG PO TABS: 150.0000 mg | ORAL_TABLET | Freq: Two times a day (BID) | ORAL | Status: DC

## 2014-09-30 MED ORDER — SODIUM CHLORIDE 0.9 % IV SOLN: 500.0000 mL | INTRAVENOUS | Status: DC

## 2014-09-30 NOTE — Progress Notes (Signed)
Called to room to assist during endoscopic procedure.  Patient ID and intended procedure confirmed with present staff. Received instructions for my participation in the procedure from the performing physician.  

## 2014-09-30 NOTE — Patient Instructions (Signed)
Discharge instructions given. Biopsies taken. Resume previous medications. YOU HAD AN ENDOSCOPIC PROCEDURE TODAY AT [facility]:   Refer to the procedure report that was given to you for any specific questions about what was found during the examination.  If the procedure report does not answer your questions, please call your gastroenterologist to clarify.  If you requested that your care partner not be given the details of your procedure findings, then the procedure report has been included in a sealed envelope for you to review at your convenience later.  YOU SHOULD EXPECT: Some feelings of bloating in the abdomen. Passage of more gas than usual.  Walking can help get rid of the air that was put into your GI tract during the procedure and reduce the bloating. If you had a lower endoscopy (such as a colonoscopy or flexible sigmoidoscopy) you may notice spotting of blood in your stool or on the toilet paper. If you underwent a bowel prep for your procedure, you may not have a normal bowel movement for a few days.  Please Note:  You might notice some irritation and congestion in your nose or some drainage.  This is from the oxygen used during your procedure.  There is no need for concern and it should clear up in a day or so.  SYMPTOMS TO REPORT IMMEDIATELY:   Following lower endoscopy (colonoscopy or flexible sigmoidoscopy):  Excessive amounts of blood in the stool  Significant tenderness or worsening of abdominal pains  Swelling of the abdomen that is new, acute  Fever of 100F or higher   Following upper endoscopy (EGD)  Vomiting of blood or coffee ground material  New chest pain or pain under the shoulder blades  Painful or persistently difficult swallowing  New shortness of breath  Fever of 100F or higher  Black, tarry-looking stools  For urgent or emergent issues, a gastroenterologist can be reached at any hour by calling [phone].   DIET: Your first meal  following the procedure should be a small meal and then it is ok to progress to your normal diet. Heavy or fried foods are harder to digest and may make you feel nauseous or bloated.  Likewise, meals heavy in dairy and vegetables can increase bloating.  Drink plenty of fluids but you should avoid alcoholic beverages for 24 hours.  ACTIVITY:  You should plan to take it easy for the rest of today and you should NOT DRIVE or use heavy machinery until tomorrow (because of the sedation medicines used during the test).    FOLLOW UP: Our staff will call the number listed on your records the next business day following your procedure to check on you and address any questions or concerns that you may have regarding the information given to you following your procedure. If we do not reach you, we will leave a message.  However, if you are feeling well and you are not experiencing any problems, there is no need to return our call.  We will assume that you have returned to your regular daily activities without incident.  If any biopsies were taken you will be contacted by phone or by letter within the next 1-3 weeks.  Please call us at [phone] if you have not heard about the biopsies in 3 weeks.    SIGNATURES/CONFIDENTIALITY: You and/or your care partner have signed paperwork which will be entered into your electronic medical record.  These signatures attest to the fact that that the information above  on your After Visit Summary has been reviewed and is understood.  Full responsibility of the confidentiality of this discharge information lies with you and/or your care-partner.

## 2014-09-30 NOTE — Op Note (Addendum)
[facility]  [address] [place], [address]   COLONOSCOPY PROCEDURE REPORT  PATIENT: Abigail, Garcia  MR#: [id number] BIRTHDATE: 02/26/1971 , 44  yrs. old GENDER: female ENDOSCOPIST: Lafayette Dragon, MD REFERRED BY:Dr Alvy Bimler. Dr  [name] PROCEDURE DATE:  09/30/2014 PROCEDURE:   Colonoscopy, diagnostic First Screening Colonoscopy - Avg.  risk and is 50 yrs.  old or older - No.  Prior Negative Screening - Now for repeat screening. N/A  History of Adenoma - Now for follow-up colonoscopy & has been > or = to 3 yrs.  N/A ASA CLASS:   Class II INDICATIONS:Colorectal Neoplasm Risk Assessment for this procedure is average risk and severe iron deficiency anemia.  Otherwise asymptomatic,.  Plavix  d/c ed 4 weeks ago MEDICATIONS: Monitored anesthesia care and Propofol 150 mg IV  DESCRIPTION OF PROCEDURE:   After the risks benefits and alternatives of the procedure were thoroughly explained, informed consent was obtained.  The digital rectal exam revealed no abnormalities of the rectum.   The LB [id number]  endoscope was introduced through the anus and advanced to the cecum, which was identified by both the appendix and ileocecal valve. No adverse events experienced.   The quality of the prep was good.  (MoviPrep was used)  The instrument was then slowly withdrawn as the colon was fully examined.      COLON FINDINGS: A normal appearing cecum, ileocecal valve, and appendiceal orifice were identified.  The ascending, transverse, descending, sigmoid colon, and rectum appeared unremarkable. Retroflexed views revealed no abnormalities. The time to cecum = 3.19 Withdrawal time = 6.01   The scope was withdrawn and the procedure completed. COMPLICATIONS: There were no immediate complications.  ENDOSCOPIC IMPRESSION: Normal colonoscopy  RECOMMENDATIONS:  Continue iron infusions as per Dr Homero Fellers, Continue to monitor H&H There is no evidence of GI blood loss.   Consider small bowel capsule endoscopy if anemia continues despite of iron infusions Recall colonoscopy at age 44  eSigned:  Lafayette Dragon, MD 09/30/2014 12:23 PM Revised: 09/30/2014 12:23 PM  cc:

## 2014-09-30 NOTE — Progress Notes (Signed)
To recovery, report to RN, VSS. 

## 2014-09-30 NOTE — Op Note (Signed)
[facility]  [address] [place], [address]   ENDOSCOPY PROCEDURE REPORT  PATIENT: Abigail Garcia, Abigail Garcia  MR#: [id number] BIRTHDATE: Dec 14, 1970 , 43  yrs. old GENDER: female ENDOSCOPIST: Lafayette Dragon, MD REFERRED BY:  Dr Alvy Bimler, Dr  [name] PROCEDURE DATE:  09/30/2014 PROCEDURE:  EGD w/ biopsy ASA CLASS:     Class II INDICATIONS:  Iron deficiency anemia while on Plavix.  Patient with menorrhagia due to fibroids. MEDICATIONS: Monitored anesthesia care and Propofol 200 mg IV TOPICAL ANESTHETIC: none  DESCRIPTION OF PROCEDURE: After the risks benefits and alternatives of the procedure were thoroughly explained, informed consent was obtained.  The LB [id number] endoscope was introduced through the mouth and advanced to the second portion of the duodenum , Without limitations.  The instrument was slowly withdrawn as the mucosa was fully examined.    Esophagus:  there was one short 5 mm erosion at the GE junction consistent with grade 1 esophagitis  Stomach: Gastric folds were normal. Gastric antrum and pyloric outlet was unremarkable. The  retroflexion of the endoscope revealed normal fundus and cardiaDuodenum: duodenal bulb and descending duodenum was normal[  .multiple biopsies were obtained from descending duodenum to rule out villous atrophy       The scope was then withdrawn from the patient and the procedure completed.  COMPLICATIONS: There were no immediate complications.  ENDOSCOPIC IMPRESSION: 1. grade 1 esophagitis. Does not explain iron deficiency anemia 2.small bowel biopsies to rule out sprue  RECOMMENDATIONS: 1.  Await pathology results 2.  Anti-reflux regimen to be follow 3.  Ranitidine 150 mg by mouth daily for reflux  REPEAT EXAM:  eSigned:  Lafayette Dragon, MD 09/30/2014 12:09 PM    CC:  PATIENT NAME:  [name], [name] MR#: [id number]

## 2014-10-03 ENCOUNTER — Telehealth: Payer: Self-pay

## 2014-10-03 NOTE — Telephone Encounter (Signed)
  Follow up Call-  Call back number 09/30/2014  Post procedure Call Back phone  # [phone]  Permission to leave phone message Yes     Patient questions:  Do you have a fever, pain , or abdominal swelling? No. Pain Score  0 *  Have you tolerated food without any problems? Yes.    Have you been able to return to your normal activities? Yes.    Do you have any questions about your discharge instructions: Diet   No. Medications  No. Follow up visit  No.  Do you have questions or concerns about your Care? No.  Actions: * If pain score is 4 or above: No action needed, pain <4.

## 2014-10-04 ENCOUNTER — Encounter: Payer: Self-pay | Admitting: Internal Medicine

## 2014-10-10 ENCOUNTER — Ambulatory Visit: Payer: Medicaid Other | Admitting: Physician Assistant

## 2014-10-11 ENCOUNTER — Encounter: Payer: Self-pay | Admitting: Internal Medicine

## 2014-11-21 ENCOUNTER — Encounter: Payer: Self-pay | Admitting: Physician Assistant

## 2014-11-21 ENCOUNTER — Ambulatory Visit (INDEPENDENT_AMBULATORY_CARE_PROVIDER_SITE_OTHER): Payer: Medicaid Other | Admitting: Physician Assistant

## 2014-11-21 VITALS — BP 104/60 | HR 84 | Ht 63.0 in | Wt 256.0 lb

## 2014-11-21 DIAGNOSIS — Z9861 Coronary angioplasty status: Secondary | ICD-10-CM

## 2014-11-21 DIAGNOSIS — I2510 Atherosclerotic heart disease of native coronary artery without angina pectoris: Secondary | ICD-10-CM

## 2014-11-21 DIAGNOSIS — E669 Obesity, unspecified: Secondary | ICD-10-CM | POA: Diagnosis not present

## 2014-11-21 DIAGNOSIS — E785 Hyperlipidemia, unspecified: Secondary | ICD-10-CM

## 2014-11-21 MED ORDER — METOPROLOL TARTRATE 25 MG PO TABS: 12.5000 mg | ORAL_TABLET | Freq: Two times a day (BID) | ORAL | Status: DC

## 2014-11-21 MED ORDER — ATORVASTATIN CALCIUM 80 MG PO TABS: 80.0000 mg | ORAL_TABLET | Freq: Every day | ORAL | Status: DC

## 2014-11-21 NOTE — Assessment & Plan Note (Addendum)
Signs and weight loss program recommended. She has been given information on weight watchers and  hospital program. Check TSH

## 2014-11-21 NOTE — Progress Notes (Signed)
Cardiology Office Note   Date:  11/21/2014   ID:  Abigail Garcia, DOB May 01, 1971, MRN [id number]  PCP:  Cresenciano Genre, MD  Cardiologist:  Loralie Champagne, MD  Chief Complaint:    History of Present Illness: Abigail Garcia is a 44 y.o. female who presents for yearly follow-up. She has history of CAD status post non-STEMI in 11/2012 with thrombus noted in the mid LAD. She underwent aspiration thrombectomy and PTCA. She was treated with Plavix which was stopped in August 2015. She had an ETT in 08/2013 for atypical chest pain that showed poor exercise tolerance but no ischemic EKG changes. She also has hyperlipidemia and obesity.  The patient comes in today stating that she ran out of her medications last August and hasn't been taking anything but aspirin. She called in last month not feeling well and was given a prescription for metoprolol which she did fill. She is not of that. She is not exercising and is asking why she is gaining weight. Her weight is up 5 pounds from last year but she says it was actually up in the 260 pound range for while. He denies any chest pain, palpitations, dyspnea, dyspnea on exertion, dizziness or presyncope. He also receives chronic iron infusions for anemia.    Past Medical History  Diagnosis Date  . RA (rheumatoid arthritis)   . CAD (coronary artery disease)     a. 11/2012 NSTEMI/Cath/PCI: LM nl, LAD 80-90 thrombotic (tx with Heparin x 2 days then aspiration thrombectomy and PTCA), RI nl, LCX sm/nl, RCA nl, EF 55-65%.  . Anemia   . NSTEMI (non-ST elevated myocardial infarction)     11/2012   . Menorrhagia   . Hx of echocardiogram     a. Echo (6/14) with EF 55-60%.   . Obesity   . Esophagitis     grade 1    Past Surgical History  Procedure Laterality Date  . Cesarean section    . Cardiac catheterization  2014  . Left heart catheterization with coronary angiogram N/A 11/16/2012    Procedure: LEFT HEART CATHETERIZATION WITH CORONARY ANGIOGRAM;  Surgeon: Peter M  Martinique, MD;  Location: [facility] CATH LAB;  Service: Cardiovascular;  Laterality: N/A;  . Coronary angiogram  11/19/2012    Procedure: CORONARY ANGIOGRAM;  Surgeon: Peter M Martinique, MD;  Location: [facility] CATH LAB;  Service: Cardiovascular;;  . Percutaneous coronary intervention-balloon only  11/19/2012    Procedure: PERCUTANEOUS CORONARY INTERVENTION-BALLOON ONLY;  Surgeon: Peter M Martinique, MD;  Location: [facility] CATH LAB;  Service: Cardiovascular;;  . Intravascular ultrasound  11/19/2012    Procedure: INTRAVASCULAR ULTRASOUND;  Surgeon: Peter M Martinique, MD;  Location: [facility] CATH LAB;  Service: Cardiovascular;;     Current Outpatient Prescriptions  Medication Sig Dispense Refill  . aspirin 81 MG EC tablet Take 1 tablet (81 mg total) by mouth daily. 30 tablet 11  . atorvastatin (LIPITOR) 80 MG tablet Take 1 tablet (80 mg total) by mouth daily at 6 PM. 30 tablet 11  . clopidogrel (PLAVIX) 75 MG tablet Take 1 tablet (75 mg total) by mouth daily with breakfast. 30 tablet 11  . ferrous sulfate 325 (65 FE) MG tablet Take 1 tablet (325 mg total) by mouth 3 (three) times daily with meals. 90 tablet 11  . metoprolol tartrate (LOPRESSOR) 25 MG tablet Take 0.5 tablets (12.5 mg total) by mouth 2 (two) times daily. 30 tablet 3   No current facility-administered medications for this visit.    Allergies:   Review of  patient's allergies indicates no known allergies.    Social History:  The patient  reports that she has never smoked. She has never used smokeless tobacco. She reports that she does not drink alcohol or use illicit drugs.   Family History:  The patient's   family history includes Heart disease in an other family member.    ROS:  Please see the history of present illness.   Otherwise, review of systems are positive for occasional lower extremity edema.   All other systems are reviewed and negative.    PHYSICAL EXAM: VS:  BP 104/60 mmHg  Pulse 84  Ht 5\' 3"  (1.6 m)  Wt 256 lb (116.121 kg)  BMI 45.36 kg/m2 ,  BMI Body mass index is 45.36 kg/(m^2). GEN: Obese, in no acute distress Neck: no JVD, HJR, carotid bruits, or masses Cardiac:  RRR; positive S4, 2/6 systolic murmur at the left sternal border, no rubs, thrill or heave,  Respiratory:  clear to auscultation bilaterally, normal work of breathing GI: soft, nontender, nondistended, + BS MS: no deformity or atrophy Extremities: without cyanosis, clubbing, edema, good distal pulses bilaterally.  Skin: warm and dry, no rash Neuro:  Strength and sensation are intact    EKG:  EKG is ordered today. The ekg ordered today demonstrates normal sinus rhythm, normal EKG   Recent Labs: 08/15/2014: HGB 8.3*; Platelets 491*    Lipid Panel    Component Value Date/Time   CHOL 151 11/10/2013 1411   TRIG 101.0 11/10/2013 1411   HDL 42.50 11/10/2013 1411   CHOLHDL 4 11/10/2013 1411   VLDL 20.2 11/10/2013 1411   LDLCALC 88 11/10/2013 1411      Wt Readings from Last 3 Encounters:  11/21/14 256 lb (116.121 kg)  09/30/14 256 lb (116.121 kg)  08/23/14 259 lb 6.4 oz (117.663 kg)      Other studies Reviewed: Additional studies/ records that were reviewed today include and review of the records demonstrates:  PMH: 1. Rheumatoid arthritis: Currently on Arava 2. Fe deficiency anemia thought to be related to heavy menses.   3. Hyperlipidemia 4. CAD: NSTEMI 6/14 with LHC showing thrombus in the mLAD with 80-90% stenosis.  Patient was treated with heparin and integrilin gtts for 2 days then repeat LHC was done.  This showed the clot was still present.  The patient then underwent aspiration thrombectomy and PTCA.  Patient had dyspnea with Brilinta.  Echo (6/14) with EF 55-60%.  ETT (3/15) with poor exercise tolerance (3') but no chest pain or ischemic ECG changes.   5. Obesity   ASSESSMENT AND PLAN:  CAD S/P percutaneous coronary angioplasty Patient denies any chest pain. She did stop all her medications. Resume metoprolol 25 mg half tablet twice a  day. Hold off on room peripheral for now because of low blood pressure. Resume Lipitor 80 mg daily. Follow-up with Dr. Aundra Dubin in 3 months.  Obesity Signs and weight loss program recommended. She has been given information on weight watchers and  hospital program. Check TSH  Hyperlipidemia Check fasting lipid panel and LFTs. Resume Lipitor    Signed, Ermalinda Barrios, PA-C  11/21/2014 11:45 AM    [facility] [address], [place], [place]  [address] Phone: [phone]; Fax: [phone]

## 2014-11-21 NOTE — Patient Instructions (Addendum)
Medication Instructions:   Your physician recommends that you continue on your current medications as directed. Please refer to the Current Medication list given to you today.  Labwork  MAKE SURE YOU RETURN FASTING LABS  BMET TSH LIPIDS LFT 11/29/14   Testing/Procedures:   Follow-Up:  WITH DR [facility] IN 3 TO 4 MONTHS   Any Other Special Instructions Will Be Listed Below (If Applicable).  WEIGHT WATCHER CONTACT NUMBER  [employer]  Weight Loss Service  Address: [address], [place], [place] [address]  [phone]    Calorie Counting for Weight Loss Calories are energy you get from the things you eat and drink. Your body uses this energy to keep you going throughout the day. The number of calories you eat affects your weight. When you eat more calories than your body needs, your body stores the extra calories as fat. When you eat fewer calories than your body needs, your body burns fat to get the energy it needs. Calorie counting means keeping track of how many calories you eat and drink each day. If you make sure to eat fewer calories than your body needs, you should lose weight. In order for calorie counting to work, you will need to eat the number of calories that are right for you in a day to lose a healthy amount of weight per week. A healthy amount of weight to lose per week is usually 1-2 lb (0.5-0.9 kg). A dietitian can determine how many calories you need in a day and give you suggestions on how to reach your calorie goal.  WHAT IS MY MY PLAN? My goal is to have __________ calories per day.  If I have this many calories per day, I should lose around __________ pounds per week. WHAT DO I NEED TO KNOW ABOUT CALORIE COUNTING? In order to meet your daily calorie goal, you will need to:  Find out how many calories are in each food you would like to eat. Try to do this before you eat.  Decide how much of the food you can eat.  Write down what you ate and how many  calories it had. Doing this is called keeping a food log. WHERE DO I FIND CALORIE INFORMATION? The number of calories in a food can be found on a Nutrition Facts label. Note that all the information on a label is based on a specific serving of the food. If a food does not have a Nutrition Facts label, try to look up the calories online or ask your dietitian for help. HOW DO I DECIDE HOW MUCH TO EAT? To decide how much of the food you can eat, you will need to consider both the number of calories in one serving and the size of one serving. This information can be found on the Nutrition Facts label. If a food does not have a Nutrition Facts label, look up the information online or ask your dietitian for help. Remember that calories are listed per serving. If you choose to have more than one serving of a food, you will have to multiply the calories per serving by the amount of servings you plan to eat. For example, the label on a package of bread might say that a serving size is 1 slice and that there are 90 calories in a serving. If you eat 1 slice, you will have eaten 90 calories. If you eat 2 slices, you will have eaten 180 calories. HOW DO I KEEP A FOOD LOG? After  each meal, record the following information in your food log:  What you ate.  How much of it you ate.  How many calories it had.  Then, add up your calories. Keep your food log near you, such as in a small notebook in your pocket. Another option is to use a mobile app or website. Some programs will calculate calories for you and show you how many calories you have left each time you add an item to the log. WHAT ARE SOME CALORIE COUNTING TIPS?  Use your calories on foods and drinks that will fill you up and not leave you hungry. Some examples of this include foods like nuts and nut butters, vegetables, lean proteins, and high-fiber foods (more than 5 g fiber per serving).  Eat nutritious foods and avoid empty calories. Empty calories  are calories you get from foods or beverages that do not have many nutrients, such as candy and soda. It is better to have a nutritious high-calorie food (such as an avocado) than a food with few nutrients (such as a bag of chips).  Know how many calories are in the foods you eat most often. This way, you do not have to look up how many calories they have each time you eat them.  Look out for foods that may seem like low-calorie foods but are really high-calorie foods, such as baked goods, soda, and fat-free candy.  Pay attention to calories in drinks. Drinks such as sodas, specialty coffee drinks, alcohol, and juices have a lot of calories yet do not fill you up. Choose low-calorie drinks like water and diet drinks.  Focus your calorie counting efforts on higher calorie items. Logging the calories in a garden salad that contains only vegetables is less important than calculating the calories in a milk shake.  Find a way of tracking calories that works for you. Get creative. Most people who are successful find ways to keep track of how much they eat in a day, even if they do not count every calorie. WHAT ARE SOME PORTION CONTROL TIPS?  Know how many calories are in a serving. This will help you know how many servings of a certain food you can have.  Use a measuring cup to measure serving sizes. This is helpful when you start out. With time, you will be able to estimate serving sizes for some foods.  Take some time to put servings of different foods on your favorite plates, bowls, and cups so you know what a serving looks like.  Try not to eat straight from a bag or box. Doing this can lead to overeating. Put the amount you would like to eat in a cup or on a plate to make sure you are eating the right portion.  Use smaller plates, glasses, and bowls to prevent overeating. This is a quick and easy way to practice portion control. If your plate is smaller, less food can fit on it.  Try not to  multitask while eating, such as watching TV or using your computer. If it is time to eat, sit down at a table and enjoy your food. Doing this will help you to start recognizing when you are full. It will also make you more aware of what and how much you are eating. HOW CAN I CALORIE COUNT WHEN EATING OUT?  Ask for smaller portion sizes or child-sized portions.  Consider sharing an entree and sides instead of getting your own entree.  If you get your own  entree, eat only half. Ask for a box at the beginning of your meal and put the rest of your entree in it so you are not tempted to eat it.  Look for the calories on the menu. If calories are listed, choose the lower calorie options.  Choose dishes that include vegetables, fruits, whole grains, low-fat dairy products, and lean protein. Focusing on smart food choices from each of the 5 food groups can help you stay on track at restaurants.  Choose items that are boiled, broiled, grilled, or steamed.  Choose water, milk, unsweetened iced tea, or other drinks without added sugars. If you want an alcoholic beverage, choose a lower calorie option. For example, a regular margarita can have up to 700 calories and a glass of wine has around 150.  Stay away from items that are buttered, battered, fried, or served with cream sauce. Items labeled "crispy" are usually fried, unless stated otherwise.  Ask for dressings, sauces, and syrups on the side. These are usually very high in calories, so do not eat much of them.  Watch out for salads. Many people think salads are a healthy option, but this is often not the case. Many salads come with bacon, fried chicken, lots of cheese, fried chips, and dressing. All of these items have a lot of calories. If you want a salad, choose a garden salad and ask for grilled meats or steak. Ask for the dressing on the side, or ask for olive oil and vinegar or lemon to use as dressing.  Estimate how many servings of a food  you are given. For example, a serving of cooked rice is  cup or about the size of half a tennis ball or one cupcake wrapper. Knowing serving sizes will help you be aware of how much food you are eating at restaurants. The list below tells you how big or small some common portion sizes are based on everyday objects.  1 oz--4 stacked dice.  3 oz--1 deck of cards.  1 tsp--1 dice.  1 Tbsp-- a Ping-Pong ball.  2 Tbsp--1 Ping-Pong ball.   cup--1 tennis ball or 1 cupcake wrapper.  1 cup--1 baseball. Document Released: 05/20/2005 Document Revised: 10/04/2013 Document Reviewed: 03/25/2013 [facility] Patient Information 2015 [place], [place]. This information is not intended to replace advice given to you by your health care provider. Make sure you discuss any questions you have with your health care provider.

## 2014-11-21 NOTE — Assessment & Plan Note (Signed)
Patient denies any chest pain. She did stop all her medications. Resume metoprolol 25 mg half tablet twice a day. Hold off on room peripheral for now because of low blood pressure. Resume Lipitor 80 mg daily. Follow-up with Dr. Aundra Dubin in 3 months.

## 2014-11-21 NOTE — Assessment & Plan Note (Signed)
Check fasting lipid panel and LFTs. Resume Lipitor

## 2014-11-28 ENCOUNTER — Telehealth: Payer: Self-pay | Admitting: Hematology and Oncology

## 2014-11-28 NOTE — Telephone Encounter (Signed)
pt called to r/s lab to same day as MD visit due to transportation....done....pt is aware that all lab results may not be back....pt ok and aware

## 2014-11-29 ENCOUNTER — Other Ambulatory Visit: Payer: Medicaid Other

## 2014-12-01 ENCOUNTER — Telehealth: Payer: Self-pay | Admitting: Hematology and Oncology

## 2014-12-01 ENCOUNTER — Other Ambulatory Visit (HOSPITAL_BASED_OUTPATIENT_CLINIC_OR_DEPARTMENT_OTHER): Payer: Medicaid Other

## 2014-12-01 ENCOUNTER — Other Ambulatory Visit: Payer: Medicaid Other

## 2014-12-01 ENCOUNTER — Encounter: Payer: Self-pay | Admitting: Hematology and Oncology

## 2014-12-01 ENCOUNTER — Ambulatory Visit (HOSPITAL_BASED_OUTPATIENT_CLINIC_OR_DEPARTMENT_OTHER): Payer: Medicaid Other | Admitting: Hematology and Oncology

## 2014-12-01 VITALS — BP 116/73 | HR 94 | Temp 98.3°F | Resp 19 | Ht 63.0 in | Wt 257.8 lb

## 2014-12-01 DIAGNOSIS — D509 Iron deficiency anemia, unspecified: Secondary | ICD-10-CM

## 2014-12-01 DIAGNOSIS — D75839 Thrombocytosis, unspecified: Secondary | ICD-10-CM

## 2014-12-01 DIAGNOSIS — I2510 Atherosclerotic heart disease of native coronary artery without angina pectoris: Secondary | ICD-10-CM | POA: Diagnosis not present

## 2014-12-01 DIAGNOSIS — D473 Essential (hemorrhagic) thrombocythemia: Secondary | ICD-10-CM

## 2014-12-01 DIAGNOSIS — Z9861 Coronary angioplasty status: Secondary | ICD-10-CM

## 2014-12-01 LAB — CBC & DIFF AND RETIC
BASO%: 0.3 % (ref 0.0–2.0)
Basophils Absolute: 0 10*3/uL (ref 0.0–0.1)
EOS ABS: 0.4 10*3/uL (ref 0.0–0.5)
EOS%: 3.3 % (ref 0.0–7.0)
HCT: 32.4 % — ABNORMAL LOW (ref 34.8–46.6)
HGB: 10.1 g/dL — ABNORMAL LOW (ref 11.6–15.9)
IMMATURE RETIC FRACT: 14.5 % — AB (ref 1.60–10.00)
LYMPH%: 17.8 % (ref 14.0–49.7)
MCH: 23.1 pg — AB (ref 25.1–34.0)
MCHC: 31.2 g/dL — ABNORMAL LOW (ref 31.5–36.0)
MCV: 74.1 fL — ABNORMAL LOW (ref 79.5–101.0)
MONO#: 1.1 10*3/uL — AB (ref 0.1–0.9)
MONO%: 9.4 % (ref 0.0–14.0)
NEUT%: 69.2 % (ref 38.4–76.8)
NEUTROS ABS: 8 10*3/uL — AB (ref 1.5–6.5)
PLATELETS: 467 10*3/uL — AB (ref 145–400)
RBC: 4.37 10*6/uL (ref 3.70–5.45)
RDW: 19.7 % — ABNORMAL HIGH (ref 11.2–14.5)
Retic %: 2.18 % — ABNORMAL HIGH (ref 0.70–2.10)
Retic Ct Abs: 95.27 10*3/uL — ABNORMAL HIGH (ref 33.70–90.70)
WBC: 11.6 10*3/uL — ABNORMAL HIGH (ref 3.9–10.3)
lymph#: 2.1 10*3/uL (ref 0.9–3.3)

## 2014-12-01 LAB — FERRITIN CHCC: Ferritin: 12 ng/ml (ref 9–269)

## 2014-12-01 NOTE — Assessment & Plan Note (Signed)
She has no angina today. There is no indication for blood transfusion now. She will remain on dual anti-platelet agents. I recommend she reduces the use of NSAID to reduce risks of stomach ulcers

## 2014-12-01 NOTE — Assessment & Plan Note (Signed)
The most likely cause of her anemia is due to chronic blood loss/malabsorption syndrome. We discussed some of the risks, benefits, and alternatives of intravenous iron infusions. The patient is symptomatic from anemia and the iron level is critically low. She tolerated oral iron supplement poorly and desires to achieved higher levels of iron faster for adequate hematopoesis. Some of the side-effects to be expected including risks of infusion reactions, phlebitis, headaches, nausea and fatigue.  The patient is willing to proceed. Patient education material was dispensed.  Goal is to keep ferritin level greater than 100 After treatment next 2 weeks, I plan to recheck her blood work next month Clinically she is responding to treatment with also improvement of thrombocytosis

## 2014-12-01 NOTE — Assessment & Plan Note (Signed)
This is likely reactive in nature. This is improving.

## 2014-12-01 NOTE — Progress Notes (Signed)
Abigail Garcia  Cresenciano Genre, MD SUMMARY OF HEMATOLOGIC HISTORY:  She was found to have abnormal CBC from routine cbc. She says she was "anemic all her life". Review of her past CBC showed her hemoglobin ranges from 7.6 to 9.0. She denies recent chest pain on exertion, shortness of breath on minimal exertion, pre-syncopal episodes, or palpitations. She does has chronic fatigue and bilateral leg cramps. Last year, she suffered from NSTEMI exacerbated from severe anemia. She had not noticed any recent bleeding such as epistaxis, hematuria or hematochezia. She had history of menorrhagia in the past. The patient denies over the counter NSAID ingestion. She was on prednisone for chronic arthritis for many years. She is on antiplatelets agents for history of heart attack. On 08/31/2013, 10/05/13 05/23/14 & 05/31/14, she was given iron infusion. Her pica resolved. She received further IV iron in April 2016. INTERVAL HISTORY: Abigail Garcia 44 y.o. female returns for further follow-up. She complained of menorrhagia. She continues to take aspirin and Plavix for her heart condition. The patient denies any recent signs or symptoms of bleeding such as spontaneous epistaxis, hematuria or hematochezia. She takes NSAIDS periodically for rheumatoid arthritis  I have reviewed the past medical history, past surgical history, social history and family history with the patient and they are unchanged from previous Garcia.  ALLERGIES:  has No Known Allergies.  MEDICATIONS:  Current Outpatient Prescriptions  Medication Sig Dispense Refill  . aspirin 81 MG EC tablet Take 1 tablet (81 mg total) by mouth daily. 30 tablet 11  . atorvastatin (LIPITOR) 80 MG tablet Take 1 tablet (80 mg total) by mouth daily at 6 PM. 30 tablet 6  . clopidogrel (PLAVIX) 75 MG tablet Take 1 tablet (75 mg total) by mouth daily with breakfast. 30 tablet 11  . ferrous sulfate 325 (65 FE) MG tablet Take 1 tablet  (325 mg total) by mouth 3 (three) times daily with meals. 90 tablet 11  . metoprolol tartrate (LOPRESSOR) 25 MG tablet Take 0.5 tablets (12.5 mg total) by mouth 2 (two) times daily. 60 tablet 6   No current facility-administered medications for this visit.     REVIEW OF SYSTEMS:   Constitutional: Denies fevers, chills or night sweats Eyes: Denies blurriness of vision Ears, nose, mouth, throat, and face: Denies mucositis or sore throat Respiratory: Denies cough, dyspnea or wheezes Cardiovascular: Denies palpitation, chest discomfort or lower extremity swelling Gastrointestinal:  Denies nausea, heartburn or change in bowel habits Skin: Denies abnormal skin rashes Lymphatics: Denies new lymphadenopathy or easy bruising Neurological:Denies numbness, tingling or new weaknesses Behavioral/Psych: Mood is stable, no new changes  All other systems were reviewed with the patient and are negative.  PHYSICAL EXAMINATION: ECOG PERFORMANCE STATUS: 1 - Symptomatic but completely ambulatory  Filed Vitals:   12/01/14 1236  BP: 116/73  Pulse: 94  Temp: 98.3 F (36.8 C)  Resp: 19   Filed Weights   12/01/14 1236  Weight: 257 lb 12.8 oz (116.937 kg)    GENERAL:alert, no distress and comfortable. She is obese SKIN: skin color, texture, turgor are normal, no rashes or significant lesions EYES: normal, Conjunctiva are pale and non-injected, sclera clear Musculoskeletal:no cyanosis of digits and no clubbing  NEURO: alert & oriented x 3 with fluent speech, no focal motor/sensory deficits  LABORATORY DATA:  I have reviewed the data as listed Results for orders placed or performed in visit on 12/01/14 (from the past 48 hour(s))  CBC & Diff and Retic  Status: Abnormal   Collection Time: 12/01/14 12:23 PM  Result Value Ref Range   WBC 11.6 (H) 3.9 - 10.3 10e3/uL   NEUT# 8.0 (H) 1.5 - 6.5 10e3/uL   HGB 10.1 (L) 11.6 - 15.9 g/dL   HCT 32.4 (L) 34.8 - 46.6 %   Platelets 467 (H) 145 - 400 10e3/uL    MCV 74.1 (L) 79.5 - 101.0 fL   MCH 23.1 (L) 25.1 - 34.0 pg   MCHC 31.2 (L) 31.5 - 36.0 g/dL   RBC 4.37 3.70 - 5.45 10e6/uL   RDW 19.7 (H) 11.2 - 14.5 %   lymph# 2.1 0.9 - 3.3 10e3/uL   MONO# 1.1 (H) 0.1 - 0.9 10e3/uL   Eosinophils Absolute 0.4 0.0 - 0.5 10e3/uL   Basophils Absolute 0.0 0.0 - 0.1 10e3/uL   NEUT% 69.2 38.4 - 76.8 %   LYMPH% 17.8 14.0 - 49.7 %   MONO% 9.4 0.0 - 14.0 %   EOS% 3.3 0.0 - 7.0 %   BASO% 0.3 0.0 - 2.0 %   Retic % 2.18 (H) 0.70 - 2.10 %   Retic Ct Abs 95.27 (H) 33.70 - 90.70 10e3/uL   Immature Retic Fract 14.50 (H) 1.60 - 10.00 %  Ferritin     Status: None   Collection Time: 12/01/14 12:23 PM  Result Value Ref Range   Ferritin 12 9 - 269 ng/ml    Lab Results  Component Value Date   WBC 11.6* 12/01/2014   HGB 10.1* 12/01/2014   HCT 32.4* 12/01/2014   MCV 74.1* 12/01/2014   PLT 467* 12/01/2014   ASSESSMENT & PLAN:  Anemia, iron deficiency The most likely cause of her anemia is due to chronic blood loss/malabsorption syndrome. We discussed some of the risks, benefits, and alternatives of intravenous iron infusions. The patient is symptomatic from anemia and the iron level is critically low. She tolerated oral iron supplement poorly and desires to achieved higher levels of iron faster for adequate hematopoesis. Some of the side-effects to be expected including risks of infusion reactions, phlebitis, headaches, nausea and fatigue.  The patient is willing to proceed. Patient education material was dispensed.  Goal is to keep ferritin level greater than 100 After treatment next 2 weeks, I plan to recheck her blood work next month Clinically she is responding to treatment with also improvement of thrombocytosis  CAD S/P percutaneous coronary angioplasty She has no angina today. There is no indication for blood transfusion now. She will remain on dual anti-platelet agents. I recommend she reduces the use of NSAID to reduce risks of stomach  ulcers  Thrombocytosis This is likely reactive in nature. This is improving.     All questions were answered. The patient knows to call the clinic with any problems, questions or concerns. No barriers to learning was detected.  I spent 15 minutes counseling the patient face to face. The total time spent in the appointment was 20 minutes and more than 50% was on counseling.     Alvy Bimler, Maury Groninger, MD 6/30/201610:25 PM

## 2014-12-01 NOTE — Telephone Encounter (Signed)
Pt confirmed labs/ov per 06/30 POF, gave pt AVS and Calendar... KJ, IV iron was added

## 2014-12-02 ENCOUNTER — Telehealth: Payer: Self-pay | Admitting: *Deleted

## 2014-12-02 NOTE — Telephone Encounter (Signed)
LVM informing pt of Dr. Calton Dach message below and keep appt next week for Iron as scheduled.

## 2014-12-02 NOTE — Telephone Encounter (Signed)
-----   Message from Heath Lark, MD sent at 12/01/2014  3:18 PM EDT ----- Regarding: low ferritin Please let her know ferritin is low as suspected. Plan for IV iron as discussed

## 2014-12-07 ENCOUNTER — Other Ambulatory Visit: Payer: Medicaid Other

## 2014-12-09 ENCOUNTER — Ambulatory Visit (HOSPITAL_BASED_OUTPATIENT_CLINIC_OR_DEPARTMENT_OTHER): Payer: Medicaid Other

## 2014-12-09 VITALS — BP 124/78 | HR 82 | Temp 98.4°F | Resp 18

## 2014-12-09 DIAGNOSIS — D509 Iron deficiency anemia, unspecified: Secondary | ICD-10-CM

## 2014-12-09 MED ORDER — SODIUM CHLORIDE 0.9 % IV SOLN
510.0000 mg | Freq: Once | INTRAVENOUS | Status: AC
  Administered 2014-12-09: 510 mg via INTRAVENOUS
  Filled 2014-12-09: qty 17

## 2014-12-09 MED ORDER — SODIUM CHLORIDE 0.9 % IV SOLN
INTRAVENOUS | Status: DC
  Administered 2014-12-09: 16:00:00 via INTRAVENOUS

## 2014-12-16 ENCOUNTER — Ambulatory Visit (HOSPITAL_BASED_OUTPATIENT_CLINIC_OR_DEPARTMENT_OTHER): Payer: Medicaid Other

## 2014-12-16 VITALS — BP 108/57 | HR 78 | Temp 98.3°F | Resp 16

## 2014-12-16 DIAGNOSIS — D509 Iron deficiency anemia, unspecified: Secondary | ICD-10-CM

## 2014-12-16 MED ORDER — SODIUM CHLORIDE 0.9 % IV SOLN
510.0000 mg | Freq: Once | INTRAVENOUS | Status: AC
  Administered 2014-12-16: 510 mg via INTRAVENOUS
  Filled 2014-12-16: qty 17

## 2014-12-16 NOTE — Patient Instructions (Signed)

## 2014-12-20 ENCOUNTER — Encounter (HOSPITAL_COMMUNITY): Payer: Self-pay | Admitting: Emergency Medicine

## 2014-12-20 ENCOUNTER — Emergency Department (HOSPITAL_COMMUNITY)
Admission: EM | Admit: 2014-12-20 | Discharge: 2014-12-20 | Disposition: A | Discharge status: Home / Self Care | Payer: Medicaid Other | Attending: Emergency Medicine | Admitting: Emergency Medicine

## 2014-12-20 DIAGNOSIS — Z7982 Long term (current) use of aspirin: Secondary | ICD-10-CM | POA: Insufficient documentation

## 2014-12-20 DIAGNOSIS — T22231A Burn of second degree of right upper arm, initial encounter: Secondary | ICD-10-CM | POA: Diagnosis not present

## 2014-12-20 DIAGNOSIS — E669 Obesity, unspecified: Secondary | ICD-10-CM | POA: Insufficient documentation

## 2014-12-20 DIAGNOSIS — Y9389 Activity, other specified: Secondary | ICD-10-CM | POA: Diagnosis not present

## 2014-12-20 DIAGNOSIS — I252 Old myocardial infarction: Secondary | ICD-10-CM | POA: Diagnosis not present

## 2014-12-20 DIAGNOSIS — Y998 Other external cause status: Secondary | ICD-10-CM | POA: Insufficient documentation

## 2014-12-20 DIAGNOSIS — X158XXA Contact with other hot household appliances, initial encounter: Secondary | ICD-10-CM | POA: Diagnosis not present

## 2014-12-20 DIAGNOSIS — T22031A Burn of unspecified degree of right upper arm, initial encounter: Secondary | ICD-10-CM | POA: Diagnosis present

## 2014-12-20 DIAGNOSIS — Z79899 Other long term (current) drug therapy: Secondary | ICD-10-CM | POA: Insufficient documentation

## 2014-12-20 DIAGNOSIS — Y9289 Other specified places as the place of occurrence of the external cause: Secondary | ICD-10-CM | POA: Diagnosis not present

## 2014-12-20 DIAGNOSIS — Z23 Encounter for immunization: Secondary | ICD-10-CM | POA: Insufficient documentation

## 2014-12-20 DIAGNOSIS — Z8719 Personal history of other diseases of the digestive system: Secondary | ICD-10-CM | POA: Insufficient documentation

## 2014-12-20 DIAGNOSIS — T2220XA Burn of second degree of shoulder and upper limb, except wrist and hand, unspecified site, initial encounter: Secondary | ICD-10-CM

## 2014-12-20 DIAGNOSIS — I2510 Atherosclerotic heart disease of native coronary artery without angina pectoris: Secondary | ICD-10-CM | POA: Insufficient documentation

## 2014-12-20 DIAGNOSIS — Z8742 Personal history of other diseases of the female genital tract: Secondary | ICD-10-CM | POA: Diagnosis not present

## 2014-12-20 DIAGNOSIS — D649 Anemia, unspecified: Secondary | ICD-10-CM | POA: Diagnosis not present

## 2014-12-20 MED ORDER — SILVER SULFADIAZINE 1 % EX CREA: 1.0000 "application " | TOPICAL_CREAM | Freq: Every day | CUTANEOUS | Status: DC

## 2014-12-20 MED ORDER — TETANUS-DIPHTH-ACELL PERTUSSIS 5-2.5-18.5 LF-MCG/0.5 IM SUSP
0.5000 mL | Freq: Once | INTRAMUSCULAR | Status: AC
  Administered 2014-12-20: 0.5 mL via INTRAMUSCULAR
  Filled 2014-12-20: qty 0.5
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
## 2014-12-20 NOTE — Discharge Instructions (Signed)
Burn Care Your skin is a natural barrier to infection. It is the largest organ of your body. Burns damage this natural protection. To help prevent infection, it is very important to follow your caregiver's instructions in the care of your burn. Burns are classified as:  First degree. There is only redness of the skin (erythema). No scarring is expected.  Second degree. There is blistering of the skin. Scarring may occur with deeper burns.  Third degree. All layers of the skin are injured, and scarring is expected. HOME CARE INSTRUCTIONS   Wash your hands well before changing your bandage.  Change your bandage as often as directed by your caregiver.  Remove the old bandage. If the bandage sticks, you may soak it off with cool, clean water.  Cleanse the burn thoroughly but gently with mild soap and water.  Pat the area dry with a clean, dry cloth.  Apply a thin layer of antibacterial cream to the burn.  Apply a clean bandage as instructed by your caregiver.  Keep the bandage as clean and dry as possible.  Elevate the affected area for the first 24 hours, then as instructed by your caregiver.  Only take over-the-counter or prescription medicines for pain, discomfort, or fever as directed by your caregiver. SEEK IMMEDIATE MEDICAL CARE IF:   You develop excessive pain.  You develop redness, tenderness, swelling, or red streaks near the burn.  The burned area develops yellowish-white fluid (pus) or a bad smell.  You have a fever. MAKE SURE YOU:   Understand these instructions.  Will watch your condition.  Will get help right away if you are not doing well or get worse. Document Released: 05/20/2005 Document Revised: 08/12/2011 Document Reviewed: 10/10/2010 [facility] Patient Information 2015 [place], [place]. This information is not intended to replace advice given to you by your health care provider. Make sure you discuss any questions you have with your health care  provider.  Second-Degree Burn A second-degree burn affects the 2 outer layers of skin. The outer layer (epidermis) and the layer underneath it (dermis) are both burned. Another name for this type of burn is a partial thickness burn. A second-degree burn may be called minor or major. This depends on the size of the burn. It also depends on what parts of the skin are burned. Minor burns may be treated with first aid. Major burns are a medical emergency. A second-degree burn is worse than a first-degree burn, but not as bad as a third-degree burn. A first-degree burn affects only the epidermis. A third-degree burn goes through all the layers of skin. A second-degree burn usually heals in 3 to 4 weeks. A minor second-degree burn usually does not leave a scar.Deeper second-degree burns may lead to scarring of the skin or contractures over joints.Contractures are scars that form over joints and may lead to reduced mobility at those joints. CAUSES  Heat (thermal) injury. This happens when skin comes in contact with something very hot. It could be a flame, a hot object, hot liquid, or steam. Most second-degree burns are thermal injuries.  Radiation. Sunlight is one type of radiation that can burn the skin. Another type of radiation is used to heat food. Radiation is also used to treat some diseases, such as cancer. All types of radiation can burn the skin. Sunlight usually causes a first-degree burn. Radiation used for heating food or treating a disease can cause a second-degree burn.  Electricity. Electrical burns can cause more damage under the skin than  on the surface. They should always be treated as major burns.  Chemicals. Many chemicals can burn the skin. The burn should be flushed with cool water and checked by an emergency caregiver. SYMPTOMS Symptoms of second-degree burns include:  Severe pain.  Extreme tenderness.  Deep redness.  Blistered skin.  Skin that has changed color.It might  look blotchy, wet, or shiny.  Swelling. TREATMENT Some second-degree burns may need to be treated in a hospital. These include major burns, electrical burns, and chemical burns. Many other second-degree burns can be treated with regular first aid, such as:  Cooling the burn. Use cool, germ-free (sterile) salt water. Place the burned area of skin into a tub of water, or cover the burned area with clean, wet towels.  Taking pain medicine.  Removing the dead skin from broken blisters. A trained caregiver may do this. Do not pop blisters.  Gently washing your skin with mild soap.  Covering the burned area with a cream.Silver sulfadiazine is a cream for burns. An antibiotic cream, such as bacitracin, may also be used to fight infection. Do not use other ointments or creams unless your caregiver says it is okay.  Protecting the burn with a sterile, non-sticky bandage.  Bandaging fingers and toes separately. This keeps them from sticking together.  Taking an antibiotic. This can help prevent infection.  Getting a tetanus shot. HOME CARE INSTRUCTIONS Medication  Take any medicine prescribed by your caregiver. Follow the directions carefully.  Ask your caregiver if you can take over-the-counter medicine to relieve pain and swelling. Do not give aspirin to children.  Make sure your caregiver knows about all other medicines you take.This includes over-the-counter medicines. Burn care  You will need to change the bandage on your burn. You may need to do this 2 or 3 times each day.  Gently clean the burned area.  Put ointment on it.  Cover the burn with a sterile bandage.  For some deeper burns or burns that cover a large area, compression garments may be prescribed. These garments can help minimize scarring and protect your mobility.  Do not put butter or oil on your skin. Use only the cream prescribed by your caregiver.  Do not put ice on your burn.  Do not break blisters on  your skin.  Keep the bandaged area dry. You might need to take a sponge bath for awhile.Ask your caregiver when you can take a shower or a tub bath again.  Do not scratch an itchy burn. Your caregiver may give you medicine to relieve very bad itching.  Infection is a big danger after a second-degree burn. Tell your caregiver right away if you have signs of infection, such as:  Redness or changing color in the burned area.  Fluid leaking from the burn.  Swelling in the burn area.  A bad smell coming from the wound. Follow-up  Keep all follow-up appointments.This is important. This is how your caregiver can tell if your treatment is working.  Protect your burn from sunlight.Use sunscreen whenever you go outside.Burned areas may be sensitive to the sun for up to 1 year. Exposure to the sun may also cause permanent darkening of scars. SEEK MEDICAL CARE IF:  You have any questions about medicines.  You have any questions about your treatment.  You wonder if it is okay to do a particular activity.  You develop a fever of more than 100.5 F (38.1 C). SEEK IMMEDIATE MEDICAL CARE IF:  You think your burn  might be infected. It may change color, become red, leak fluid, swell, or smell bad.  You develop a fever of more than 102 F (38.9 C). Document Released: 10/22/2010 Document Revised: 08/12/2011 Document Reviewed: 10/22/2010 [facility] Patient Information 2015 [place], [place]. This information is not intended to replace advice given to you by your health care provider. Make sure you discuss any questions you have with your health care provider.
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
## 2015-01-18 ENCOUNTER — Telehealth: Payer: Self-pay | Admitting: Internal Medicine

## 2015-01-18 ENCOUNTER — Other Ambulatory Visit: Payer: Medicaid Other

## 2015-01-18 NOTE — Telephone Encounter (Signed)
Patient called in to cancel all appointments and will call back to reschedule

## 2015-01-20 ENCOUNTER — Ambulatory Visit: Payer: Medicaid Other

## 2015-01-20 ENCOUNTER — Ambulatory Visit: Payer: Medicaid Other | Admitting: Hematology and Oncology

## 2015-03-06 ENCOUNTER — Ambulatory Visit: Payer: Medicaid Other | Admitting: Internal Medicine

## 2015-05-24 ENCOUNTER — Other Ambulatory Visit: Payer: Self-pay | Admitting: Hematology and Oncology

## 2015-08-11 ENCOUNTER — Other Ambulatory Visit (HOSPITAL_COMMUNITY)
Admission: RE | Admit: 2015-08-11 | Discharge: 2015-08-11 | Disposition: A | Discharge status: Home / Self Care | Payer: Medicaid Other | Source: Ambulatory Visit | Attending: Obstetrics & Gynecology | Admitting: Obstetrics & Gynecology

## 2015-08-11 ENCOUNTER — Ambulatory Visit (INDEPENDENT_AMBULATORY_CARE_PROVIDER_SITE_OTHER): Payer: Self-pay | Admitting: Obstetrics & Gynecology

## 2015-08-11 ENCOUNTER — Encounter: Payer: Self-pay | Admitting: Obstetrics & Gynecology

## 2015-08-11 ENCOUNTER — Encounter: Payer: Self-pay | Admitting: General Practice

## 2015-08-11 VITALS — BP 108/81 | HR 81 | Temp 98.2°F | Ht 63.0 in | Wt 246.1 lb

## 2015-08-11 DIAGNOSIS — N840 Polyp of corpus uteri: Secondary | ICD-10-CM | POA: Insufficient documentation

## 2015-08-11 DIAGNOSIS — Z1151 Encounter for screening for human papillomavirus (HPV): Secondary | ICD-10-CM

## 2015-08-11 DIAGNOSIS — N939 Abnormal uterine and vaginal bleeding, unspecified: Secondary | ICD-10-CM | POA: Insufficient documentation

## 2015-08-11 DIAGNOSIS — N938 Other specified abnormal uterine and vaginal bleeding: Secondary | ICD-10-CM | POA: Diagnosis present

## 2015-08-11 DIAGNOSIS — N926 Irregular menstruation, unspecified: Secondary | ICD-10-CM

## 2015-08-11 DIAGNOSIS — Z01419 Encounter for gynecological examination (general) (routine) without abnormal findings: Secondary | ICD-10-CM

## 2015-08-11 DIAGNOSIS — Z01411 Encounter for gynecological examination (general) (routine) with abnormal findings: Secondary | ICD-10-CM | POA: Diagnosis present

## 2015-08-11 DIAGNOSIS — N841 Polyp of cervix uteri: Secondary | ICD-10-CM

## 2015-08-11 DIAGNOSIS — Z124 Encounter for screening for malignant neoplasm of cervix: Secondary | ICD-10-CM

## 2015-08-11 LAB — CBC
HCT: 29.7 % — ABNORMAL LOW (ref 36.0–46.0)
Hemoglobin: 8.8 g/dL — ABNORMAL LOW (ref 12.0–15.0)
MCH: 20 pg — AB (ref 26.0–34.0)
MCHC: 29.6 g/dL — ABNORMAL LOW (ref 30.0–36.0)
MCV: 67.5 fL — ABNORMAL LOW (ref 78.0–100.0)
MPV: 8.6 fL (ref 8.6–12.4)
PLATELETS: 686 10*3/uL — AB (ref 150–400)
RBC: 4.4 MIL/uL (ref 3.87–5.11)
RDW: 17 % — ABNORMAL HIGH (ref 11.5–15.5)
WBC: 9.8 10*3/uL (ref 4.0–10.5)

## 2015-08-11 NOTE — Progress Notes (Signed)
Here for annual exam. Also c/o irregular bleeding.  Discussed need for mammogram. She will call for her appointment with [facility] where she prefers when she gets her medicaid straightened out.

## 2015-08-11 NOTE — Patient Instructions (Signed)

## 2015-08-11 NOTE — Progress Notes (Signed)
Patient ID: Abigail Garcia, female   DOB: 1970/07/15, 45 y.o.   MRN: [id number]  Chief Complaint  Patient presents with  . Gynecologic Exam  irregular bleeding since 12/16  HPI Abigail Garcia is a 45 y.o. female.  [id number] Patient's last menstrual period was 08/10/2015. Irregular bleeding now only spotting since December, previously normal menses. No pain. Last pelvic and pap was 02/2013, and she was told she had fibroids. [place] was done and endometrial biopsy. Pap and bx were normal.  HPI  Past Medical History  Diagnosis Date  . RA (rheumatoid arthritis) ([place])   . CAD (coronary artery disease)     a. 11/2012 NSTEMI/Cath/PCI: LM nl, LAD 80-90 thrombotic (tx with Heparin x 2 days then aspiration thrombectomy and PTCA), RI nl, LCX sm/nl, RCA nl, EF 55-65%.  . Anemia   . NSTEMI (non-ST elevated myocardial infarction) ([place])     11/2012   . Menorrhagia   . Hx of echocardiogram     a. Echo (6/14) with EF 55-60%.   . Obesity   . Esophagitis     grade 1    Past Surgical History  Procedure Laterality Date  . Cesarean section    . Cardiac catheterization  2014  . Left heart catheterization with coronary angiogram N/A 11/16/2012    Procedure: LEFT HEART CATHETERIZATION WITH CORONARY ANGIOGRAM;  Surgeon: Peter M Martinique, MD;  Location: [facility] C[facility] LAB;  Service: Cardiovascular;  Laterality: N/A;  . Coronary angiogram  11/19/2012    Procedure: CORONARY ANGIOGRAM;  Surgeon: Peter M Martinique, MD;  Location: [facility] CATH LAB;  Service: Cardiovascular;;  . Percutaneous coronary intervention-balloon only  11/19/2012    Procedure: PERCUTANEOUS CORONARY INTERVENTION-BALLOON ONLY;  Surgeon: Peter M Martinique, MD;  Location: [facility] CATH LAB;  Service: Cardiovascular;;  . Intravascular ultrasound  11/19/2012    Procedure: INTRAVASCULAR ULTRASOUND;  Surgeon: Peter M Martinique, MD;  Location: [facility] CATH LAB;  Service: Cardiovascular;;  . Bilateral tubal    . Dilation and curettage of uterus      Family History  Problem Relation Age of  Onset  . Heart disease      Social History Social History  Substance Use Topics  . Smoking status: Never Smoker   . Smokeless tobacco: Never Used  . Alcohol Use: No    No Known Allergies  Current Outpatient Prescriptions  Medication Sig Dispense Refill  . acetaminophen (TYLENOL) 325 MG tablet Take 650 mg by mouth every 6 (six) hours as needed.    Marland Kitchen aspirin 81 MG EC tablet Take 1 tablet (81 mg total) by mouth daily. 30 tablet 11  . ferrous sulfate 325 (65 FE) MG tablet Take 1 tablet (325 mg total) by mouth 3 (three) times daily with meals. 90 tablet 11  . atorvastatin (LIPITOR) 80 MG tablet Take 1 tablet (80 mg total) by mouth daily at 6 PM. (Patient not taking: Reported on 08/11/2015) 30 tablet 6  . clopidogrel (PLAVIX) 75 MG tablet Take 1 tablet (75 mg total) by mouth daily with breakfast. (Patient not taking: Reported on 08/11/2015) 30 tablet 11  . metoprolol tartrate (LOPRESSOR) 25 MG tablet Take 0.5 tablets (12.5 mg total) by mouth 2 (two) times daily. (Patient not taking: Reported on 08/11/2015) 60 tablet 6   No current facility-administered medications for this visit.    Review of Systems Review of Systems  Respiratory: Negative.   Cardiovascular: Negative.   Gastrointestinal: Negative.   Genitourinary: Positive for vaginal bleeding and menstrual problem. Negative for vaginal discharge and pelvic  pain.    Blood pressure 108/81, pulse 81, temperature 98.2 F (36.8 C), height 5\' 3"  (1.6 m), weight 246 lb 1.6 oz (111.63 kg), last menstrual period 08/10/2015.  Physical Exam Physical Exam  Constitutional: She is oriented to person, place, and time. She appears well-developed. No distress.  Cardiovascular: Normal rate.   Pulmonary/Chest: Effort normal. No respiratory distress.  Breasts: breasts appear normal, no suspicious masses, no skin or nipple changes or axillary nodes.   Abdominal: Soft. She exhibits no mass. There is no tenderness.  Genitourinary: Vagina normal. No  vaginal discharge found.  1.5 cm wide erythematous polyp at cervical os. Verbal and written consent for removal obtained. Time out done. Grasped with forceps and incompletely removed and specimen sent to pathology. Uterus approximately 6 week size no mass  Neurological: She is alert and oriented to person, place, and time.  Skin: Skin is warm. No pallor.  Psychiatric: She has a normal mood and affect. Her behavior is normal.  Vitals reviewed.   Data Reviewed   CLINICAL DATA: Menorrhagia  EXAM: TRANSABDOMINAL AND TRANSVAGINAL ULTRASOUND OF PELVIS  TECHNIQUE: Both transabdominal and transvaginal ultrasound examinations of the pelvis were performed. Transabdominal technique was performed for global imaging of the pelvis including uterus, ovaries, adnexal regions, and pelvic cul-de-sac. It was necessary to proceed with endovaginal exam following the transabdominal exam to visualize the endometrium.  COMPARISON: None  FINDINGS: Uterus  Measurements: 13.2 x 7.9 x 8.1 cm. There are multiple fibroids (at least 3). 2.1 cm anterior mid uterus fibroid. 1.4 cm right mid body fibroid (these 2 contain calcifications and shadowing). 3.0 cm left fundal fibroid.  Endometrium  Thickness: 17 mm in thickness, homogeneous.  Right ovary  Measurements: 3.4 x 2.2 x 2.1 cm. Normal size and echotexture. No adnexal masses.  Left ovary  Measurements: 3.7 x 2.4 x 2.6 cm. Normal appearance/no adnexal mass.  Other findings  No free fluid.  IMPRESSION: Enlarged fibroid uterus.  Endometrium borderline thickened at 17 mm.   Electronically Signed  By: Rolm Baptise M.D.  On: 02/08/2013 14:56       Assessment    DUB with cervical or prolapsed endometrial polyp Well woman exam-pap and breast exam done     Plan    Schedule pelvic US RTC for f/u Patient will go to [facility] for mammogram CBC today         ARNOLD,JAMES 08/11/2015, 11:56 AM

## 2015-08-14 LAB — CYTOLOGY - PAP

## 2015-08-16 ENCOUNTER — Telehealth: Payer: Self-pay | Admitting: *Deleted

## 2015-08-16 MED ORDER — METRONIDAZOLE 500 MG PO TABS: 2000.0000 mg | ORAL_TABLET | Freq: Once | ORAL | Status: DC

## 2015-08-16 NOTE — Telephone Encounter (Signed)
-----   Message from Woodroe Mode, MD sent at 08/16/2015 10:22 AM EDT ----- Trichomonas on pap, may Rx Flagyl 2 g po single dose. Surgical path benign polyp

## 2015-08-16 NOTE — Telephone Encounter (Signed)
Spoke to patient and let her know her biopsy was negative. However her PAP showed trich. A prescription for flagyl is being sent to her pharmacy. Advised her to let her partner know as well so that he can get treatment. Understanding voiced.

## 2015-08-17 ENCOUNTER — Encounter: Payer: Self-pay | Admitting: Internal Medicine

## 2015-08-17 ENCOUNTER — Ambulatory Visit (INDEPENDENT_AMBULATORY_CARE_PROVIDER_SITE_OTHER): Payer: Medicaid Other | Admitting: Internal Medicine

## 2015-08-17 VITALS — BP 118/76 | HR 93 | Temp 98.6°F | Wt 249.3 lb

## 2015-08-17 DIAGNOSIS — M06042 Rheumatoid arthritis without rheumatoid factor, left hand: Secondary | ICD-10-CM

## 2015-08-17 DIAGNOSIS — D509 Iron deficiency anemia, unspecified: Secondary | ICD-10-CM | POA: Diagnosis not present

## 2015-08-17 DIAGNOSIS — M069 Rheumatoid arthritis, unspecified: Secondary | ICD-10-CM | POA: Diagnosis not present

## 2015-08-17 DIAGNOSIS — Z7982 Long term (current) use of aspirin: Secondary | ICD-10-CM

## 2015-08-17 DIAGNOSIS — I2510 Atherosclerotic heart disease of native coronary artery without angina pectoris: Secondary | ICD-10-CM

## 2015-08-17 DIAGNOSIS — Z9861 Coronary angioplasty status: Secondary | ICD-10-CM | POA: Diagnosis not present

## 2015-08-17 DIAGNOSIS — R7303 Prediabetes: Secondary | ICD-10-CM

## 2015-08-17 DIAGNOSIS — H538 Other visual disturbances: Secondary | ICD-10-CM

## 2015-08-17 DIAGNOSIS — M06041 Rheumatoid arthritis without rheumatoid factor, right hand: Secondary | ICD-10-CM

## 2015-08-17 LAB — POCT GLYCOSYLATED HEMOGLOBIN (HGB A1C): Hemoglobin A1C: 5.1

## 2015-08-17 LAB — GLUCOSE, CAPILLARY: GLUCOSE-CAPILLARY: 82 mg/dL (ref 65–99)

## 2015-08-17 NOTE — Patient Instructions (Signed)
Thank you for coming to see me today. It was a pleasure. Today we talked about:   1.  Please follow up with your cardiologist, Dr. Aundra Dubin.  In the meantime, please resume taking your medications (Metoprolol and Lipitor). 2.  Please follow up with Dr. Alvy Bimler to manage your anemia and possibly restart your infusions. 3.  I will refer you to optometry to have your vision checked.  I will check your A1c to see if you have developed diabetes. 4.  I will refer you to a new rheumatologist.  Please follow-up with me in 6 months.  If you have any questions or concerns, please do not hesitate to call the office at [phone].  Take Care,   [name], DO

## 2015-08-18 ENCOUNTER — Other Ambulatory Visit: Payer: Self-pay | Admitting: Internal Medicine

## 2015-08-18 DIAGNOSIS — R7303 Prediabetes: Secondary | ICD-10-CM | POA: Insufficient documentation

## 2015-08-18 DIAGNOSIS — H538 Other visual disturbances: Secondary | ICD-10-CM | POA: Insufficient documentation

## 2015-08-18 NOTE — Assessment & Plan Note (Signed)
Assessment: Patient has not been to follow up with cardiology since June 2016.  She stopped taking her atorvastatin and metoprolol.  Only taking aspirin and iron supplementation at present time.  She understands she needs to follow up with cardiology but reports she just kind of lost track of following up with her providers over the past 9 months.  She reports using Aleve for her pain due to RA.  She is not having any anginal symptoms or shortness of breath  Plan: - recommended resuming atorvastatin and metoprolol as directed in last cardiology notes - continue aspirin - will take plavix off her medication list as was supposed to be on DAP for 1 year from 2014 - follow up with cardiology

## 2015-08-18 NOTE — Progress Notes (Signed)
Patient ID: Abigail Garcia, female   DOB: 09-May-1971, 45 y.o.   MRN: [id number]   Subjective:   Patient ID: Abigail Garcia female   DOB: 20-Feb-1971 45 y.o.   MRN: [id number]  HPI: Abigail Garcia is a 45 y.o. with PMHx detailed below presenting to [facility] for follow up of her rheumatoid arthritis.  Please see A&P for status of medical conditions addressed at today's visit.    Past Medical History  Diagnosis Date  . RA (rheumatoid arthritis) ([place])   . CAD (coronary artery disease)     a. 11/2012 NSTEMI/Cath/PCI: LM nl, LAD 80-90 thrombotic (tx with Heparin x 2 days then aspiration thrombectomy and PTCA), RI nl, LCX sm/nl, RCA nl, EF 55-65%.  . Anemia   . NSTEMI (non-ST elevated myocardial infarction) ([place])     11/2012   . Menorrhagia   . Hx of echocardiogram     a. Echo (6/14) with EF 55-60%.   . Obesity   . Esophagitis     grade 1   Current Outpatient Prescriptions  Medication Sig Dispense Refill  . acetaminophen (TYLENOL) 325 MG tablet Take 650 mg by mouth every 6 (six) hours as needed.    Marland Kitchen aspirin 81 MG EC tablet Take 1 tablet (81 mg total) by mouth daily. 30 tablet 11  . atorvastatin (LIPITOR) 80 MG tablet Take 1 tablet (80 mg total) by mouth daily at 6 PM. (Patient not taking: Reported on 08/11/2015) 30 tablet 6  . clopidogrel (PLAVIX) 75 MG tablet Take 1 tablet (75 mg total) by mouth daily with breakfast. (Patient not taking: Reported on 08/11/2015) 30 tablet 11  . ferrous sulfate 325 (65 FE) MG tablet Take 1 tablet (325 mg total) by mouth 3 (three) times daily with meals. 90 tablet 11  . metoprolol tartrate (LOPRESSOR) 25 MG tablet Take 0.5 tablets (12.5 mg total) by mouth 2 (two) times daily. (Patient not taking: Reported on 08/11/2015) 60 tablet 6  . metroNIDAZOLE (FLAGYL) 500 MG tablet Take 4 tablets (2,000 mg total) by mouth once. 4 tablet 0   No current facility-administered medications for this visit.   Family History  Problem Relation Age of Onset  . Heart disease     Social  History   Social History  . Marital Status: Single    Spouse Name: N/A  . Number of Children: N/A  . Years of Education: N/A   Social History Main Topics  . Smoking status: Never Smoker   . Smokeless tobacco: Never Used  . Alcohol Use: No  . Drug Use: No  . Sexual Activity: Not Currently    Birth Control/ Protection: Surgical   Other Topics Concern  . None   Social History Narrative   Review of Systems: Review of Systems  Constitutional: Positive for malaise/fatigue. Negative for fever and chills.  Eyes: Positive for blurred vision.  Respiratory: Negative for cough and shortness of breath.   Cardiovascular: Negative for chest pain and leg swelling.  Musculoskeletal: Positive for joint pain.     Objective:  Physical Exam: Filed Vitals:   08/17/15 1419  BP: 118/76  Pulse: 93  Temp: 98.6 F (37 C)  TempSrc: Oral  Weight: 249 lb 4.8 oz (113.082 kg)  SpO2: 100%   Physical Exam  Constitutional: She is oriented to person, place, and time and well-developed, well-nourished, and in no distress.  HENT:  Head: Normocephalic and atraumatic.  Eyes: Conjunctivae and EOM are normal.  Neck: Normal range of motion.  Cardiovascular: Normal rate and regular  rhythm.   Pulmonary/Chest: Effort normal.  Musculoskeletal:  Decreased passive and active ROM bilateral wrists.   Unable to make a full fist with right hand due to discomfort. Some noted swelling of PIP joints bilaterally.  Neurological: She is alert and oriented to person, place, and time.  Skin: Skin is warm and dry. No erythema.    Assessment & Plan:  Please see problem list for assessment and plan.  Case discussed with Dr. Daryll Drown.

## 2015-08-18 NOTE — Assessment & Plan Note (Signed)
Assessment: Patient reports diagnosis in 1999 while living in [place].  States she has been on multiple medications (Prednisone, Methotrexate, Enbrel).  Per notes from office visit with Dr. 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  She has been off all her RA medications for about 1 year and is asking for referral back to rheumatology but would like to establish with new provider.  Plan: - rheumatology referral

## 2015-08-18 NOTE — Assessment & Plan Note (Signed)
Assessment: Patient reports symptoms of blurry vision that she has been using reading glasses for but would like to get her eyes checked.  She was pre-diabetic in 2014 with A1c 5.7.  A1c today is 5.1 so do not suspect DM contributing to her symptoms of blurred vision.  Plan: - check A1c again in 12 months - encourage weight loss and exercise - optometry referral

## 2015-08-18 NOTE — Assessment & Plan Note (Signed)
Assessment: Patient was seen by Ob/Gyn last week and CBC showed hemoglobin of 8.8 with MCV 67.5.  Patient reports some fatigue but does not have any anginal symptoms shortness of breath.  Plan: - follow up with hematology/oncology (Dr. Alvy Bimler)

## 2015-08-21 ENCOUNTER — Ambulatory Visit (HOSPITAL_COMMUNITY): Payer: Self-pay

## 2015-08-24 NOTE — Progress Notes (Signed)
Internal Medicine Clinic Attending  Case discussed with Dr. Wallace at the time of the visit.  We reviewed the resident's history and exam and pertinent patient test results.  I agree with the assessment, diagnosis, and plan of care documented in the resident's note.  

## 2015-08-25 ENCOUNTER — Ambulatory Visit (HOSPITAL_COMMUNITY)
Admission: RE | Admit: 2015-08-25 | Discharge: 2015-08-25 | Disposition: A | Discharge status: Home / Self Care | Payer: Medicaid Other | Source: Ambulatory Visit | Attending: Obstetrics & Gynecology | Admitting: Obstetrics & Gynecology

## 2015-08-25 ENCOUNTER — Ambulatory Visit (INDEPENDENT_AMBULATORY_CARE_PROVIDER_SITE_OTHER): Payer: Medicaid Other | Admitting: Obstetrics & Gynecology

## 2015-08-25 ENCOUNTER — Telehealth: Payer: Self-pay

## 2015-08-25 VITALS — BP 125/81 | HR 89 | Temp 98.2°F | Wt 251.6 lb

## 2015-08-25 DIAGNOSIS — N939 Abnormal uterine and vaginal bleeding, unspecified: Secondary | ICD-10-CM | POA: Insufficient documentation

## 2015-08-25 DIAGNOSIS — D259 Leiomyoma of uterus, unspecified: Secondary | ICD-10-CM | POA: Diagnosis not present

## 2015-08-25 DIAGNOSIS — N926 Irregular menstruation, unspecified: Secondary | ICD-10-CM

## 2015-08-25 DIAGNOSIS — N841 Polyp of cervix uteri: Secondary | ICD-10-CM | POA: Diagnosis present

## 2015-08-25 NOTE — Progress Notes (Signed)
Subjective:     Patient ID: Abigail Garcia, female   DOB: May 14, 1971, 45 y.o.   MRN: [id number]  HPI Abigail Garcia is a [phone] [id number] with a history of irregular bleeding who came to clinic today for followup after removal of a benign cervical polyp on 08/11/15. She has no complaints. After the procedure, she states that she had some brown bleeding for a few days that transitioned into a normal period lasting about 7 days. She says that the brown bleeding had a foul odor that went away after she took Flagyl for trichomonas infection. She has not had any abdominal pain, abnormal discharge, or urinary symptoms.  Review of Systems  Constitutional: Negative for fever and chills.  Gastrointestinal: Negative for nausea and vomiting.  Genitourinary: Negative for dysuria, urgency, frequency, difficulty urinating and pelvic pain.    Past Medical History  Diagnosis Date  . RA (rheumatoid arthritis) ([place])   . CAD (coronary artery disease)     a. 11/2012 NSTEMI/Cath/PCI: LM nl, LAD 80-90 thrombotic (tx with Heparin x 2 days then aspiration thrombectomy and PTCA), RI nl, LCX sm/nl, RCA nl, EF 55-65%.  . Anemia   . NSTEMI (non-ST elevated myocardial infarction) ([place])     11/2012   . Menorrhagia   . Hx of echocardiogram     a. Echo (6/14) with EF 55-60%.   . Obesity   . Esophagitis     grade 1    Past Surgical History  Procedure Laterality Date  . Cesarean section    . Cardiac catheterization  2014  . Left heart catheterization with coronary angiogram N/A 11/16/2012    Procedure: LEFT HEART CATHETERIZATION WITH CORONARY ANGIOGRAM;  Surgeon: Peter M Martinique, MD;  Location: [facility] CATH LAB;  Service: Cardiovascular;  Laterality: N/A;  . Coronary angiogram  11/19/2012    Procedure: CORONARY ANGIOGRAM;  Surgeon: Peter M Martinique, MD;  Location: [facility] CATH LAB;  Service: Cardiovascular;;  . Percutaneous coronary intervention-balloon only  11/19/2012    Procedure: PERCUTANEOUS CORONARY INTERVENTION-BALLOON ONLY;  Surgeon:  Peter M Martinique, MD;  Location: [facility] CATH LAB;  Service: Cardiovascular;;  . Intravascular ultrasound  11/19/2012    Procedure: INTRAVASCULAR ULTRASOUND;  Surgeon: Peter M Martinique, MD;  Location: [facility] CATH LAB;  Service: Cardiovascular;;  . Bilateral tubal    . Dilation and curettage of uterus      Current Outpatient Prescriptions on File Prior to Visit  Medication Sig Dispense Refill  . aspirin 81 MG EC tablet Take 1 tablet (81 mg total) by mouth daily. 30 tablet 11  . ferrous sulfate 325 (65 FE) MG tablet Take 1 tablet (325 mg total) by mouth 3 (three) times daily with meals. 90 tablet 11  . acetaminophen (TYLENOL) 325 MG tablet Take 650 mg by mouth every 6 (six) hours as needed. Reported on 08/25/2015    . atorvastatin (LIPITOR) 80 MG tablet Take 1 tablet (80 mg total) by mouth daily at 6 PM. (Patient not taking: Reported on 08/11/2015) 30 tablet 6  . metoprolol tartrate (LOPRESSOR) 25 MG tablet Take 0.5 tablets (12.5 mg total) by mouth 2 (two) times daily. (Patient not taking: Reported on 08/11/2015) 60 tablet 6  . metroNIDAZOLE (FLAGYL) 500 MG tablet Take 4 tablets (2,000 mg total) by mouth once. (Patient not taking: Reported on 08/25/2015) 4 tablet 0   No current facility-administered medications on file prior to visit.       Objective:   Physical Exam Filed Vitals:   08/25/15 1016  BP: 125/81  Pulse: 89  Temp: 98.2 F (36.8 C)  Weight: 251 lb 9.6 oz (114.125 kg)    Transvaginal/transabdominal ultrasound results on 08/25/15:  Two small approximately 2.8 cm uterine fibroids (unchanged from previous ultrasound on 02/08/13). Endometrium was unremarkable. Ovaries not visualized. No acute or focal abnormality. No free pelvic fluid .    Assessment:     Abigail Garcia is a 45yo with a history of irregular bleeding who came to clinic today for followup after removal of a benign cervical polyp, with no complaints and reassuring ultrasound findings today.    Plan:     Expectant management:  Advised patient to follow up if she has any further problems.      Urbano Heir, Med Student 08/25/15 10:39 AM

## 2015-08-25 NOTE — Telephone Encounter (Signed)
Please pre-cert patient U/S for 08/25/2015 and called Kendra at [phone]

## 2015-08-25 NOTE — Patient Instructions (Signed)
Trichomoniasis Trichomoniasis is an infection caused by an organism called Trichomonas. The infection can affect both women and men. In women, the outer female genitalia and the vagina are affected. In men, the penis is mainly affected, but the prostate and other reproductive organs can also be involved. Trichomoniasis is a sexually transmitted infection (STI) and is most often passed to another person through sexual contact.  RISK FACTORS  Having unprotected sexual intercourse.  Having sexual intercourse with an infected partner. SIGNS AND SYMPTOMS  Symptoms of trichomoniasis in women include:  Abnormal gray-green frothy vaginal discharge.  Itching and irritation of the vagina.  Itching and irritation of the area outside the vagina. Symptoms of trichomoniasis in men include:   Penile discharge with or without pain.  Pain during urination. This results from inflammation of the urethra. DIAGNOSIS  Trichomoniasis may be found during a Pap test or physical exam. Your health care provider may use one of the following methods to help diagnose this infection:  Testing the pH of the vagina with a test tape.  Using a vaginal swab test that checks for the Trichomonas organism. A test is available that provides results within a few minutes.  Examining a urine sample.  Testing vaginal secretions. Your health care provider may test you for other STIs, including HIV. TREATMENT   You may be given medicine to fight the infection. Women should inform their health care provider if they could be or are pregnant. Some medicines used to treat the infection should not be taken during pregnancy.  Your health care provider may recommend over-the-counter medicines or creams to decrease itching or irritation.  Your sexual partner will need to be treated if infected.  Your health care provider may test you for infection again 3 months after treatment. HOME CARE INSTRUCTIONS   Take medicines only as  directed by your health care provider.  Take over-the-counter medicine for itching or irritation as directed by your health care provider.  Do not have sexual intercourse while you have the infection.  Women should not douche or wear tampons while they have the infection.  Discuss your infection with your partner. Your partner may have gotten the infection from you, or you may have gotten it from your partner.  Have your sex partner get examined and treated if necessary.  Practice safe, informed, and protected sex.  See your health care provider for other STI testing. SEEK MEDICAL CARE IF:   You still have symptoms after you finish your medicine.  You develop abdominal pain.  You have pain when you urinate.  You have bleeding after sexual intercourse.  You develop a rash.  Your medicine makes you sick or makes you throw up (vomit). MAKE SURE YOU:  Understand these instructions.  Will watch your condition.  Will get help right away if you are not doing well or get worse.   This information is not intended to replace advice given to you by your health care provider. Make sure you discuss any questions you have with your health care provider.   Document Released: 11/13/2000 Document Revised: 06/10/2014 Document Reviewed: 03/01/2013 Elsevier Interactive Patient Education 2016 Elsevier Inc.  

## 2015-08-25 NOTE — Telephone Encounter (Signed)
Left pt's pre-cert # on pt's appt.

## 2015-09-06 LAB — HM DIABETES EYE EXAM

## 2015-09-22 ENCOUNTER — Encounter: Payer: Self-pay | Admitting: Internal Medicine

## 2015-09-22 ENCOUNTER — Encounter: Payer: Self-pay | Admitting: *Deleted

## 2015-09-22 ENCOUNTER — Other Ambulatory Visit: Payer: Self-pay | Admitting: Oncology

## 2015-09-22 ENCOUNTER — Ambulatory Visit (INDEPENDENT_AMBULATORY_CARE_PROVIDER_SITE_OTHER): Payer: Medicaid Other | Admitting: Internal Medicine

## 2015-09-22 VITALS — BP 126/92 | HR 78 | Temp 97.6°F | Ht 63.0 in | Wt 250.1 lb

## 2015-09-22 DIAGNOSIS — E785 Hyperlipidemia, unspecified: Secondary | ICD-10-CM

## 2015-09-22 DIAGNOSIS — D259 Leiomyoma of uterus, unspecified: Secondary | ICD-10-CM

## 2015-09-22 DIAGNOSIS — D509 Iron deficiency anemia, unspecified: Secondary | ICD-10-CM

## 2015-09-22 DIAGNOSIS — M06041 Rheumatoid arthritis without rheumatoid factor, right hand: Secondary | ICD-10-CM

## 2015-09-22 DIAGNOSIS — M06042 Rheumatoid arthritis without rheumatoid factor, left hand: Principal | ICD-10-CM

## 2015-09-22 DIAGNOSIS — M069 Rheumatoid arthritis, unspecified: Secondary | ICD-10-CM | POA: Diagnosis not present

## 2015-09-22 MED ORDER — POLYSACCHARIDE IRON COMPLEX 150 MG PO CAPS: 150.0000 mg | ORAL_CAPSULE | Freq: Two times a day (BID) | ORAL | Status: DC

## 2015-09-22 MED ORDER — MELOXICAM 7.5 MG PO TABS: 7.5000 mg | ORAL_TABLET | Freq: Every day | ORAL | Status: DC

## 2015-09-22 NOTE — Assessment & Plan Note (Addendum)
Pt reports iron deficiency but denies fatigue, HA, dizziness.  She has a h/o fibroids but was told this was not the cause of her excessive bleeding.  US pelvis shows small fibroids.  She is requesting labs for iron studies.  Has received feraheme and followed by Dr. Alvy Bimler in the past.  She has been seen by GI and gynecology for workup of her anemia and workup unremarkable.  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 -iron studies  -cont iron supplementation  -will set her up with feraheme infusion -will need to obtain urine to check for hematuria as a source  -referral back to Dr. Alvy Bimler

## 2015-09-22 NOTE — Patient Instructions (Signed)
Thank you for your visit today.  We will check your labs today. I have sent you some meloxicam for your pain, please take once daily.  Continue iron supplementation. Follow up with cardiology.    Please be sure to bring all of your medications with you to every visit; this includes herbal supplements, vitamins, eye drops, and any over-the-counter medications.   Should you have any questions regarding your medications and/or any new or worsening symptoms, please be sure to call the clinic at [phone].   If you believe that you are suffering from a life threatening condition or one that may result in the loss of limb or function, then you should call 911 and proceed to the nearest Emergency Department.   A healthy lifestyle and preventative care can promote health and wellness.   Maintain regular health, dental, and eye exams.  Eat a healthy diet. Foods like vegetables, fruits, whole grains, low-fat dairy products, and lean protein foods contain the nutrients you need without too many calories. Decrease your intake of foods high in solid fats, added sugars, and salt. Get information about a proper diet from your caregiver, if necessary.  Regular physical exercise is one of the most important things you can do for your health. Most adults should get at least 150 minutes of moderate-intensity exercise (any activity that increases your heart rate and causes you to sweat) each week. In addition, most adults need muscle-strengthening exercises on 2 or more days a week.   Maintain a healthy weight. The body mass index (BMI) is a screening tool to identify possible weight problems. It provides an estimate of body fat based on height and weight. Your caregiver can help determine your BMI, and can help you achieve or maintain a healthy weight. For adults 20 years and older:  A BMI below 18.5 is considered underweight.  A BMI of 18.5 to 24.9 is normal.  A BMI of 25 to 29.9 is considered  overweight.  A BMI of 30 and above is considered obese.

## 2015-09-22 NOTE — Progress Notes (Signed)
Patient ID: Abigail Garcia, female   DOB: March 18, 1971, 45 y.o.   MRN: [id number]     Subjective:   Patient ID: Abigail Garcia female    DOB: 17-Feb-1971 45 y.o.    MRN: [id number] Health Maintenance Due: Health Maintenance Due  Topic Date Due  . HIV Screening  02/07/1986    _________________________________________________  HPI: Abigail Garcia is a 45 y.o. female here for f/u visit for RA.  Pt has a PMH outlined below.  Please see problem-based charting assessment and plan for further status of patient's chronic medical problems addressed at today's visit.  PMH: Past Medical History  Diagnosis Date  . RA (rheumatoid arthritis) ([place])   . CAD (coronary artery disease)     a. 11/2012 NSTEMI/Cath/PCI: LM nl, LAD 80-90 thrombotic (tx with Heparin x 2 days then aspiration thrombectomy and PTCA), RI nl, LCX sm/nl, RCA nl, EF 55-65%.  . Anemia   . NSTEMI (non-ST elevated myocardial infarction) ([place])     11/2012   . Menorrhagia   . Hx of echocardiogram     a. Echo (6/14) with EF 55-60%.   . Obesity   . Esophagitis     grade 1    Medications: Current Outpatient Prescriptions on File Prior to Visit  Medication Sig Dispense Refill  . acetaminophen (TYLENOL) 325 MG tablet Take 650 mg by mouth every 6 (six) hours as needed. Reported on 08/25/2015    . aspirin 81 MG EC tablet Take 1 tablet (81 mg total) by mouth daily. 30 tablet 11  . atorvastatin (LIPITOR) 80 MG tablet Take 1 tablet (80 mg total) by mouth daily at 6 PM. (Patient not taking: Reported on 08/11/2015) 30 tablet 6  . ferrous sulfate 325 (65 FE) MG tablet Take 1 tablet (325 mg total) by mouth 3 (three) times daily with meals. 90 tablet 11  . metoprolol tartrate (LOPRESSOR) 25 MG tablet Take 0.5 tablets (12.5 mg total) by mouth 2 (two) times daily. (Patient not taking: Reported on 08/11/2015) 60 tablet 6  . metroNIDAZOLE (FLAGYL) 500 MG tablet Take 4 tablets (2,000 mg total) by mouth once. (Patient not taking: Reported on 08/25/2015) 4 tablet 0    No current facility-administered medications on file prior to visit.    Allergies: No Known Allergies  FH: Family History  Problem Relation Age of Onset  . Heart disease      SH: Social History   Social History  . Marital Status: Single    Spouse Name: N/A  . Number of Children: N/A  . Years of Education: N/A   Social History Main Topics  . Smoking status: Never Smoker   . Smokeless tobacco: Never Used  . Alcohol Use: No  . Drug Use: No  . Sexual Activity: Not Currently    Birth Control/ Protection: Surgical   Other Topics Concern  . None   Social History Narrative    Review of Systems: Constitutional: Negative for fever, chills.  Eyes: Negative for blurred vision.  Respiratory: Negative for cough and shortness of breath.  Cardiovascular: Negative for chest pain.  Gastrointestinal: Negative for nausea, vomiting. Musculoskeletal: +swelling hands/feet, +pain hands/feet.    Objective:   Vital Signs: Filed Vitals:   09/22/15 1034 09/22/15 1035  BP:  126/92  Pulse:  78  Temp: 97.6 F (36.4 C)   TempSrc: Oral   Height: 5\' 3"  (1.6 m)   Weight: 250 lb 1.6 oz (113.445 kg)   SpO2:  100%      BP Readings from  Last 3 Encounters:  09/22/15 126/92  08/25/15 125/81  08/17/15 118/76    Physical Exam: Constitutional: Vital signs reviewed.  Patient is in NAD and cooperative with exam.  Head: Normocephalic and atraumatic. Eyes: EOMI, conjunctivae nl, no scleral icterus.  Neck: Supple. Cardiovascular: RRR, no MRG. Pulmonary/Chest: Normal effort, CTAB, no wheezes, rales, or rhonchi. Abdominal: Soft. NT/ND +BS. Musculoskeletal: R hand/L hand: decreased ROM of wrist b/l, swelling of the MCP, PIP b/l without pain or deformity.   Neurological: A&O x3, cranial nerves II-XII are grossly intact, moving all extremities. Extremities: No LE edema. Skin: Warm, dry and intact.   Assessment & Plan:   Assessment and plan was discussed and formulated with my  attending.

## 2015-09-22 NOTE — Assessment & Plan Note (Addendum)
Pt here requesting pain medication for her RA.  She used to go to Dr. Charlestine Night but was unhappy.  Pain is in the right hand and foot greater than the left.  Also reports swelling in the hands and feet.   -meloxicam qd

## 2015-09-22 NOTE — Assessment & Plan Note (Signed)
Pt reports being on statin in the past but has not taken it last year.  She reports having nausea from the medication.   -lipid panel today -will f/u with Dr. Aundra Dubin on Monday, 4/24

## 2015-09-23 LAB — BMP8+ANION GAP
ANION GAP: 17 mmol/L (ref 10.0–18.0)
BUN/Creatinine Ratio: 17 (ref 9–23)
BUN: 11 mg/dL (ref 6–24)
CALCIUM: 9.3 mg/dL (ref 8.7–10.2)
CHLORIDE: 101 mmol/L (ref 96–106)
CO2: 20 mmol/L (ref 18–29)
Creatinine, Ser: 0.63 mg/dL (ref 0.57–1.00)
GFR calc Af Amer: 126 mL/min/{1.73_m2} (ref >59)
GFR calc non Af Amer: 109 mL/min/{1.73_m2} (ref >59)
GLUCOSE: 91 mg/dL (ref 65–99)
Potassium: 4.8 mmol/L (ref 3.5–5.2)
Sodium: 138 mmol/L (ref 134–144)

## 2015-09-23 LAB — IRON AND TIBC
Iron Saturation: 5 % — CL (ref 15–55)
Iron: 21 ug/dL — ABNORMAL LOW (ref 27–159)
Total Iron Binding Capacity: 439 ug/dL (ref 250–450)
UIBC: 418 ug/dL (ref 131–425)

## 2015-09-23 LAB — LIPID PANEL
CHOLESTEROL TOTAL: 208 mg/dL — AB (ref 100–199)
Chol/HDL Ratio: 4.6 ratio units — ABNORMAL HIGH (ref 0.0–4.4)
HDL: 45 mg/dL (ref >39)
LDL CALC: 147 mg/dL — AB (ref 0–99)
TRIGLYCERIDES: 80 mg/dL (ref 0–149)
VLDL Cholesterol Cal: 16 mg/dL (ref 5–40)

## 2015-09-23 LAB — FERRITIN: Ferritin: 3 ng/mL — ABNORMAL LOW (ref 15–150)

## 2015-09-25 ENCOUNTER — Encounter: Payer: Self-pay | Admitting: Physician Assistant

## 2015-09-25 ENCOUNTER — Ambulatory Visit (INDEPENDENT_AMBULATORY_CARE_PROVIDER_SITE_OTHER): Payer: Medicaid Other | Admitting: Physician Assistant

## 2015-09-25 VITALS — BP 110/60 | HR 86 | Ht 63.0 in | Wt 248.8 lb

## 2015-09-25 DIAGNOSIS — E785 Hyperlipidemia, unspecified: Secondary | ICD-10-CM | POA: Diagnosis not present

## 2015-09-25 DIAGNOSIS — Z9861 Coronary angioplasty status: Secondary | ICD-10-CM

## 2015-09-25 DIAGNOSIS — D509 Iron deficiency anemia, unspecified: Secondary | ICD-10-CM

## 2015-09-25 DIAGNOSIS — I2510 Atherosclerotic heart disease of native coronary artery without angina pectoris: Secondary | ICD-10-CM | POA: Diagnosis not present

## 2015-09-25 DIAGNOSIS — E669 Obesity, unspecified: Secondary | ICD-10-CM

## 2015-09-25 MED ORDER — ATORVASTATIN CALCIUM 80 MG PO TABS: 80.0000 mg | ORAL_TABLET | Freq: Every day | ORAL | Status: DC

## 2015-09-25 NOTE — Patient Instructions (Addendum)
Medication Instructions:   Your physician recommends that you continue   Your OTHER  current medications as directed. Please refer to the Current Medication list given to you today.  START TAKING ATORVASTATIN 80 MG ONCE A DAY   If you need a refill on your cardiac medications before your next appointment, please call your pharmacy.  Labwork:  FASTING LIPIDS AMF LFT IN 6 WEEKS   Testing/Procedures:  NONE ORDER TODAY    Follow-Up:  Your physician wants you to follow-up in: [place] will receive a reminder letter in the mail two months in advance. If you don't receive a letter, please call our office to schedule the follow-up appointment.     Any Other Special Instructions Will Be Listed Below (If Applicable).

## 2015-09-25 NOTE — Progress Notes (Signed)
Cardiology Office Note    Date:  09/25/2015   ID:  Abigail Garcia, DOB 1970/10/24, MRN [id number]  PCP:  [name], DO  Cardiologist:Dr. Aundra Dubin    Chief Complaint  Patient presents with  . Follow-up    no sx    History of Present Illness:  Abigail Garcia is a 45 y.o. female female who presents for yearly follow-up. She has history of CAD status post non-STEMI in 11/2012 with thrombus noted in the mid LAD. She underwent aspiration thrombectomy and PTCA. She was treated with Plavix which was stopped in August 2015. She had an ETT in 08/2013 for atypical chest pain that showed poor exercise tolerance but no ischemic EKG changes. She also has hyperlipidemia and obesity.  I saw the patient last June 2016 at which time she had stopped taking her medications almost a year prior to that. She was not exercising and weight was up to 260 pounds. I resumed metoprolol 12.5 mg 3 times a day and Lipitor 80 mg daily.  Patient comes in today for yearly checkup. She stopped her Lipitor because she didn't think she needed to take it. She has since been diagnosed with rheumatoid arthritis and wants to begin a swim program. She is not exercising at this time. She had fasting lipid panel checked by her primary care last week and her cholesterol was 208 LDL 147. She is also chronically anemic from heavy menses but just had a polyp removed and has decreased bleeding. She denies any chest pain, palpitations, dyspnea, dyspnea on exertion, dizziness or presyncope.      Past Medical History  Diagnosis Date  . RA (rheumatoid arthritis) ([place])   . CAD (coronary artery disease)     a. 11/2012 NSTEMI/Cath/PCI: LM nl, LAD 80-90 thrombotic (tx with Heparin x 2 days then aspiration thrombectomy and PTCA), RI nl, LCX sm/nl, RCA nl, EF 55-65%.  . Anemia   . NSTEMI (non-ST elevated myocardial infarction) ([place])     11/2012   . Menorrhagia   . Hx of echocardiogram     a. Echo (6/14) with EF 55-60%.   . Obesity   .  Esophagitis     grade 1    Past Surgical History  Procedure Laterality Date  . Cesarean section    . Cardiac catheterization  2014  . Left heart catheterization with coronary angiogram N/A 11/16/2012    Procedure: LEFT HEART CATHETERIZATION WITH CORONARY ANGIOGRAM;  Surgeon: Peter M Martinique, MD;  Location: [facility] CATH LAB;  Service: Cardiovascular;  Laterality: N/A;  . Coronary angiogram  11/19/2012    Procedure: CORONARY ANGIOGRAM;  Surgeon: Peter M Martinique, MD;  Location: [facility] CATH LAB;  Service: Cardiovascular;;  . Percutaneous coronary intervention-balloon only  11/19/2012    Procedure: PERCUTANEOUS CORONARY INTERVENTION-BALLOON ONLY;  Surgeon: Peter M Martinique, MD;  Location: [facility] CATH LAB;  Service: Cardiovascular;;  . Intravascular ultrasound  11/19/2012    Procedure: INTRAVASCULAR ULTRASOUND;  Surgeon: Peter M Martinique, MD;  Location: [facility] CATH LAB;  Service: Cardiovascular;;  . Bilateral tubal    . Dilation and curettage of uterus      Current Medications: Outpatient Prescriptions Prior to Visit  Medication Sig Dispense Refill  . aspirin 81 MG EC tablet Take 1 tablet (81 mg total) by mouth daily. 30 tablet 11  . iron polysaccharides (NIFEREX) 150 MG capsule Take 1 capsule (150 mg total) by mouth 2 (two) times daily. 60 capsule 3  . meloxicam (MOBIC) 7.5 MG tablet Take 1 tablet (7.5 mg  total) by mouth daily. 30 tablet 0  . metoprolol tartrate (LOPRESSOR) 25 MG tablet Take 0.5 tablets (12.5 mg total) by mouth 2 (two) times daily. 60 tablet 6   No facility-administered medications prior to visit.     Allergies:   Review of patient's allergies indicates no known allergies.   Social History   Social History  . Marital Status: Single    Spouse Name: N/A  . Number of Children: N/A  . Years of Education: N/A   Social History Main Topics  . Smoking status: Never Smoker   . Smokeless tobacco: Never Used  . Alcohol Use: No  . Drug Use: No  . Sexual Activity: Not Currently    Birth Control/  Protection: Surgical   Other Topics Concern  . None   Social History Narrative     Family History:  The patient's    family history is not on file.   ROS:   Please see the history of present illness.    Review of Systems  Constitution: Negative.  HENT: Negative.   Cardiovascular: Negative.   Respiratory: Negative.   Hematologic/Lymphatic: Positive for bleeding problem.  Musculoskeletal: Positive for arthritis. Negative for joint pain.  Gastrointestinal: Negative.   Genitourinary: Negative.   Neurological: Negative.    All other systems reviewed and are negative.   PHYSICAL EXAM:   VS:  BP 110/60 mmHg  Pulse 86  Ht 5\' 3"  (1.6 m)  Wt 248 lb 12.8 oz (112.855 kg)  BMI 44.08 kg/m2  SpO2 96%  LMP 09/13/2015 (Exact Date)   GEN: Obese, in no acute distress Neck: no JVD, carotid bruits, or masses Cardiac: RRR; positive S4, no murmurs, rubs, or,no edema  Respiratory:  clear to auscultation bilaterally, normal work of breathing GI: soft, nontender, nondistended, + BS MS: no deformity or atrophy Skin: warm and dry, no rash Neuro:  Alert and Oriented x 3, Strength and sensation are intact Psych: euthymic mood, full affect  Wt Readings from Last 3 Encounters:  09/25/15 248 lb 12.8 oz (112.855 kg)  09/22/15 250 lb 1.6 oz (113.445 kg)  08/25/15 251 lb 9.6 oz (114.125 kg)      Studies/Labs Reviewed:   EKG:  EKG is  ordered today.  The ekg ordered today demonstrates Normal sinus rhythm, normal EKG  Recent Labs: 08/11/2015: Hemoglobin 8.8*; Platelets 686* 09/22/2015: BUN 11; Creatinine, Ser 0.63; Potassium 4.8; Sodium 138   Lipid Panel    Component Value Date/Time   CHOL 208* 09/22/2015 1130   CHOL 151 11/10/2013 1411   TRIG 80 09/22/2015 1130   HDL 45 09/22/2015 1130   HDL 42.50 11/10/2013 1411   CHOLHDL 4.6* 09/22/2015 1130   CHOLHDL 4 11/10/2013 1411   VLDL 20.2 11/10/2013 1411   LDLCALC 147* 09/22/2015 1130   LDLCALC 88 11/10/2013 1411    Additional studies/  records that were reviewed today include:   NSTEMI 6/14 with LHC showing thrombus in the mLAD with 80-90% stenosis.  Patient was treated with heparin and integrilin gtts for 2 days then repeat LHC was done.  This showed the clot was still present.  The patient then underwent aspiration thrombectomy and PTCA.  Patient had dyspnea with Brilinta.  Echo (6/14) with EF 55-60%.  ETT (3/15) with poor exercise tolerance (3') but no chest pain or ischemic ECG changes.      ASSESSMENT:    1. Hyperlipemia   2. CAD S/P percutaneous coronary angioplasty   3. Obesity   4. Hyperlipidemia   5.  Anemia, iron deficiency      PLAN:  In order of problems listed above:  Patient has hyperlipidemia and has not been taking her Lipitor. She says it caused some reflux in the past but she can't remember how she was taking it. We'll resume Lipitor 80 mg once daily and check fasting lipid panel and LFTs in 6 weeks. Follow-up with Dr. Aundra Dubin in one year  CAD is stable without chest pain. Continue metoprolol and aspirin  Obesity weight loss is recommended. She is trying to get into swim program to help her lose weight  Anemia on chronic iron felt secondary to excessive menses. Recently had a polyp removed and this has improved  Medication Adjustments/Labs and Tests Ordered: Current medicines are reviewed at length with the patient today.  Concerns regarding medicines are outlined above.  Medication changes, Labs and Tests ordered today are listed in the Patient Instructions below. Patient Instructions  Medication Instructions:   Your physician recommends that you continue   Your OTHER  current medications as directed. Please refer to the Current Medication list given to you today.  START TAKING ATORVASTATIN 80 MG ONCE A DAY   If you need a refill on your cardiac medications before your next appointment, please call your pharmacy.  Labwork:  FASTING LIPIDS AMF LFT IN 6 WEEKS   Testing/Procedures:  NONE ORDER  TODAY    Follow-Up:  Your physician wants you to follow-up in: [place] will receive a reminder letter in the mail two months in advance. If you don't receive a letter, please call our office to schedule the follow-up appointment.     Any Other Special Instructions Will Be Listed Below (If Applicable).                                                                                                                                                       Sumner Boast, PA-C  09/25/2015 12:52 PM    [facility] [address], [place], [place]  [address] Phone: [phone]; Fax: [phone]

## 2015-09-26 ENCOUNTER — Telehealth: Payer: Self-pay | Admitting: *Deleted

## 2015-09-26 NOTE — Progress Notes (Signed)
Medicine attending: Medical history, presenting problems, physical findings, and medications, reviewed with resident physician Dr Jacqueline Gill  on the day of the patient visit and I concur with her evaluation and management plan. 

## 2015-09-27 ENCOUNTER — Telehealth: Payer: Self-pay | Admitting: *Deleted

## 2015-10-02 ENCOUNTER — Other Ambulatory Visit (HOSPITAL_COMMUNITY): Payer: Self-pay | Admitting: *Deleted

## 2015-10-03 ENCOUNTER — Ambulatory Visit (HOSPITAL_COMMUNITY)
Admission: RE | Admit: 2015-10-03 | Discharge: 2015-10-03 | Disposition: A | Discharge status: Home / Self Care | Payer: Medicaid Other | Source: Ambulatory Visit | Attending: Internal Medicine | Admitting: Internal Medicine

## 2015-10-03 VITALS — BP 115/70 | HR 76 | Temp 97.7°F | Resp 20 | Ht 63.0 in | Wt 250.0 lb

## 2015-10-03 DIAGNOSIS — D509 Iron deficiency anemia, unspecified: Secondary | ICD-10-CM | POA: Diagnosis not present

## 2015-10-03 MED ORDER — SODIUM CHLORIDE 0.9 % IV SOLN
510.0000 mg | INTRAVENOUS | Status: DC
  Administered 2015-10-03: 510 mg via INTRAVENOUS
  Filled 2015-10-03: qty 17

## 2015-10-03 NOTE — Telephone Encounter (Signed)
See note above

## 2015-10-09 ENCOUNTER — Encounter (HOSPITAL_COMMUNITY)
Admission: RE | Admit: 2015-10-09 | Discharge: 2015-10-09 | Disposition: A | Discharge status: Home / Self Care | Payer: Medicaid Other | Source: Ambulatory Visit | Attending: Internal Medicine | Admitting: Internal Medicine

## 2015-10-09 VITALS — BP 113/72 | HR 74 | Temp 97.9°F | Resp 20 | Ht 63.0 in | Wt 250.0 lb

## 2015-10-09 DIAGNOSIS — D509 Iron deficiency anemia, unspecified: Secondary | ICD-10-CM | POA: Diagnosis not present

## 2015-10-09 MED ORDER — SODIUM CHLORIDE 0.9 % IV SOLN
510.0000 mg | INTRAVENOUS | Status: AC
  Administered 2015-10-09: 510 mg via INTRAVENOUS
  Filled 2015-10-09: qty 17

## 2015-10-09 MED ORDER — SODIUM CHLORIDE 0.9 % IV SOLN
510.0000 mg | INTRAVENOUS | Status: DC
  Filled 2015-10-09: qty 17

## 2015-10-31 ENCOUNTER — Telehealth: Payer: Self-pay | Admitting: Hematology and Oncology

## 2015-10-31 NOTE — Telephone Encounter (Signed)
s.w. pt and advised on 6.15 appt.Marland KitchenMarland KitchenMarland KitchenMarland Kitchenpt ok and aware

## 2015-11-06 ENCOUNTER — Other Ambulatory Visit: Payer: Medicaid Other

## 2015-11-14 ENCOUNTER — Other Ambulatory Visit: Payer: Self-pay | Admitting: Internal Medicine

## 2015-11-14 DIAGNOSIS — M06042 Rheumatoid arthritis without rheumatoid factor, left hand: Principal | ICD-10-CM

## 2015-11-14 DIAGNOSIS — M06041 Rheumatoid arthritis without rheumatoid factor, right hand: Secondary | ICD-10-CM

## 2015-11-14 MED ORDER — MELOXICAM 7.5 MG PO TABS: 7.5000 mg | ORAL_TABLET | Freq: Every day | ORAL | Status: DC

## 2015-11-15 ENCOUNTER — Other Ambulatory Visit: Payer: Self-pay | Admitting: Hematology and Oncology

## 2015-11-15 ENCOUNTER — Other Ambulatory Visit (HOSPITAL_BASED_OUTPATIENT_CLINIC_OR_DEPARTMENT_OTHER): Payer: Medicaid Other

## 2015-11-15 ENCOUNTER — Telehealth: Payer: Self-pay | Admitting: *Deleted

## 2015-11-15 DIAGNOSIS — D509 Iron deficiency anemia, unspecified: Secondary | ICD-10-CM | POA: Diagnosis not present

## 2015-11-15 LAB — CBC & DIFF AND RETIC
BASO%: 0.7 % (ref 0.0–2.0)
Basophils Absolute: 0.1 10*3/uL (ref 0.0–0.1)
EOS ABS: 0.3 10*3/uL (ref 0.0–0.5)
EOS%: 3.3 % (ref 0.0–7.0)
HCT: 38.8 % (ref 34.8–46.6)
HEMOGLOBIN: 12 g/dL (ref 11.6–15.9)
IMMATURE RETIC FRACT: 6.6 % (ref 1.60–10.00)
LYMPH%: 28.8 % (ref 14.0–49.7)
MCH: 22.6 pg — ABNORMAL LOW (ref 25.1–34.0)
MCHC: 30.9 g/dL — ABNORMAL LOW (ref 31.5–36.0)
MCV: 73.2 fL — AB (ref 79.5–101.0)
MONO#: 0.9 10*3/uL (ref 0.1–0.9)
MONO%: 10.2 % (ref 0.0–14.0)
NEUT#: 5.2 10*3/uL (ref 1.5–6.5)
NEUT%: 57 % (ref 38.4–76.8)
Platelets: 386 10*3/uL (ref 145–400)
RBC: 5.3 10*6/uL (ref 3.70–5.45)
RETIC %: 1.35 % (ref 0.70–2.10)
Retic Ct Abs: 71.55 10*3/uL (ref 33.70–90.70)
WBC: 9.1 10*3/uL (ref 3.9–10.3)
lymph#: 2.6 10*3/uL (ref 0.9–3.3)

## 2015-11-15 NOTE — Telephone Encounter (Signed)
Explained to pt she needs to have labs done at least one day prior to office visit so Dr. Alvy Bimler will have results to review on her visit.  Pt agreed to come in this afternoon for lab so she can keep her appt w/ Dr. Alvy Bimler tomorrrow.

## 2015-11-16 ENCOUNTER — Other Ambulatory Visit: Payer: Self-pay | Admitting: Hematology and Oncology

## 2015-11-16 ENCOUNTER — Encounter: Payer: Self-pay | Admitting: Hematology and Oncology

## 2015-11-16 ENCOUNTER — Ambulatory Visit (HOSPITAL_BASED_OUTPATIENT_CLINIC_OR_DEPARTMENT_OTHER): Payer: Medicaid Other | Admitting: Hematology and Oncology

## 2015-11-16 VITALS — BP 117/78 | HR 93 | Temp 98.0°F | Resp 19 | Wt 247.2 lb

## 2015-11-16 DIAGNOSIS — D509 Iron deficiency anemia, unspecified: Secondary | ICD-10-CM | POA: Diagnosis not present

## 2015-11-16 LAB — IRON AND TIBC
%SAT: 14 % — AB (ref 21–57)
IRON: 44 ug/dL (ref 41–142)
TIBC: 321 ug/dL (ref 236–444)
UIBC: 277 ug/dL (ref 120–384)

## 2015-11-16 LAB — FERRITIN: FERRITIN: 17 ng/mL (ref 9–269)

## 2015-11-16 NOTE — Assessment & Plan Note (Addendum)
She had received iron infusions in the past. Her anemia had resolved but she has persistent microcytosis. Even though her last blood test for hemoglobinopathy evaluation came back within normal limits, I suspect she might have thalassemia trait. This is benign. Due to history of recurrent, severe iron deficiency anemia, I recommend she takes 1 iron supplement indefinitely I plan to reorder blood work in 3 months.

## 2015-11-16 NOTE — Progress Notes (Signed)
Abig[facility] OFFICE PROGRESS NOTE  [name], DO SUMMARY OF HEMATOLOGIC HISTORY:  She was found to have abnormal CBC from routine cbc. She says she was "anemic all her life". Review of her past CBC showed her hemoglobin ranges from 7.6 to 9.0. She denies recent chest pain on exertion, shortness of breath on minimal exertion, pre-syncopal episodes, or palpitations. She does has chronic fatigue and bilateral leg cramps. Last year, she suffered from NSTEMI exacerbated from severe anemia. She had not noticed any recent bleeding such as epistaxis, hematuria or hematochezia. She had history of menorrhagia in the past. The patient denies over the counter NSAID ingestion. She was on prednisone for chronic arthritis for many years. She is on antiplatelets agents for history of heart attack. On 08/31/2013, 10/05/13 05/23/14 & 05/31/14, she was given iron infusion. Her pica resolved. She received further IV iron in April 2016, July 2016 and May 2017 INTERVAL HISTORY: Abigail Garcia 45 y.o. female returns for further follow-up. She had extensive evaluation done for evaluation of uterine fibroids. Her periods are becoming more regular and light recently. She feels well. The patient denies any recent signs or symptoms of bleeding such as spontaneous epistaxis, hematuria or hematochezia. Denies pica.  I have reviewed the past medical history, past surgical history, social history and family history with the patient and they are unchanged from previous note.  ALLERGIES:  has No Known Allergies.  MEDICATIONS:  Current Outpatient Prescriptions  Medication Sig Dispense Refill  . aspirin 81 MG EC tablet Take 1 tablet (81 mg total) by mouth daily. 30 tablet 11  . atorvastatin (LIPITOR) 80 MG tablet Take 1 tablet (80 mg total) by mouth daily. 30 tablet 6  . iron polysaccharides (NIFEREX) 150 MG capsule Take 1 capsule (150 mg total) by mouth 2 (two) times daily. 60 capsule 3  . meloxicam (MOBIC)  7.5 MG tablet Take 1 tablet (7.5 mg total) by mouth daily. 30 tablet 0  . metoprolol tartrate (LOPRESSOR) 25 MG tablet Take 0.5 tablets (12.5 mg total) by mouth 2 (two) times daily. 60 tablet 6   No current facility-administered medications for this visit.     REVIEW OF SYSTEMS:   Constitutional: Denies fevers, chills or night sweats Eyes: Denies blurriness of vision Ears, nose, mouth, throat, and face: Denies mucositis or sore throat Respiratory: Denies cough, dyspnea or wheezes Cardiovascular: Denies palpitation, chest discomfort or lower extremity swelling Gastrointestinal:  Denies nausea, heartburn or change in bowel habits Skin: Denies abnormal skin rashes Lymphatics: Denies new lymphadenopathy or easy bruising Neurological:Denies numbness, tingling or new weaknesses Behavioral/Psych: Mood is stable, no new changes  All other systems were reviewed with the patient and are negative.  PHYSICAL EXAMINATION: ECOG PERFORMANCE STATUS: 0 - Asymptomatic  Filed Vitals:   11/16/15 1235  BP: 117/78  Pulse: 93  Temp: 98 F (36.7 C)  Resp: 19   Filed Weights   11/16/15 1235  Weight: 247 lb 3.2 oz (112.129 kg)    GENERAL:alert, no distress and comfortable. She is obese SKIN: skin color, texture, turgor are normal, no rashes or significant lesions EYES: normal, Conjunctiva are pink and non-injected, sclera clear Musculoskeletal:no cyanosis of digits and no clubbing  NEURO: alert & oriented x 3 with fluent speech, no focal motor/sensory deficits  LABORATORY DATA:  I have reviewed the data as listed     Component Value Date/Time   NA 138 09/22/2015 1130   NA 141 09/29/2013 1216   NA 138 08/20/2013 1437  K 4.8 09/22/2015 1130   K 3.8 09/29/2013 1216   CL 101 09/22/2015 1130   CO2 20 09/22/2015 1130   CO2 23 09/29/2013 1216   GLUCOSE 91 09/22/2015 1130   GLUCOSE 103 09/29/2013 1216   GLUCOSE 80 08/20/2013 1437   BUN 11 09/22/2015 1130   BUN 10.5 09/29/2013 1216   BUN 12  08/20/2013 1437   CREATININE 0.63 09/22/2015 1130   CREATININE 0.8 09/29/2013 1216   CREATININE 0.73 08/20/2013 1437   CALCIUM 9.3 09/22/2015 1130   CALCIUM 9.7 09/29/2013 1216   PROT 7.5 09/29/2013 1216   PROT 7.1 08/20/2013 1437   ALBUMIN 3.4* 09/29/2013 1216   ALBUMIN 3.7 08/20/2013 1437   AST 20 09/29/2013 1216   AST 22 08/20/2013 1437   ALT 20 09/29/2013 1216   ALT 26 08/20/2013 1437   ALKPHOS 76 09/29/2013 1216   ALKPHOS 81 08/20/2013 1437   BILITOT 0.28 09/29/2013 1216   BILITOT 0.2 08/20/2013 1437   GFRNONAA 109 09/22/2015 1130   GFRNONAA >89 12/28/2012 1114   GFRAA 126 09/22/2015 1130   GFRAA >89 12/28/2012 1114    No results found for: SPEP, UPEP  Lab Results  Component Value Date   WBC 9.1 11/15/2015   NEUTROABS 5.2 11/15/2015   HGB 12.0 11/15/2015   HCT 38.8 11/15/2015   MCV 73.2* 11/15/2015   PLT 386 11/15/2015      Chemistry      Component Value Date/Time   NA 138 09/22/2015 1130   NA 141 09/29/2013 1216   NA 138 08/20/2013 1437   K 4.8 09/22/2015 1130   K 3.8 09/29/2013 1216   CL 101 09/22/2015 1130   CO2 20 09/22/2015 1130   CO2 23 09/29/2013 1216   BUN 11 09/22/2015 1130   BUN 10.5 09/29/2013 1216   BUN 12 08/20/2013 1437   CREATININE 0.63 09/22/2015 1130   CREATININE 0.8 09/29/2013 1216   CREATININE 0.73 08/20/2013 1437      Component Value Date/Time   CALCIUM 9.3 09/22/2015 1130   CALCIUM 9.7 09/29/2013 1216   ALKPHOS 76 09/29/2013 1216   ALKPHOS 81 08/20/2013 1437   AST 20 09/29/2013 1216   AST 22 08/20/2013 1437   ALT 20 09/29/2013 1216   ALT 26 08/20/2013 1437   BILITOT 0.28 09/29/2013 1216   BILITOT 0.2 08/20/2013 1437      ASSESSMENT & PLAN:  Anemia, iron deficiency She had received iron infusions in the past. Her anemia had resolved but she has persistent microcytosis. Even though her last blood test for hemoglobinopathy evaluation came back within normal limits, I suspect she might have thalassemia trait. This is  benign. Due to history of recurrent, severe iron deficiency anemia, I recommend she takes 1 iron supplement indefinitely I plan to reorder blood work in 3 months.     All questions were answered. The patient knows to call the clinic with any problems, questions or concerns. No barriers to learning was detected.  I spent 10 minutes counseling the patient face to face. The total time spent in the appointment was 15 minutes and more than 50% was on counseling.     [facility], Jujhar Everett, MD 6/15/20172:47 PM

## 2016-02-15 ENCOUNTER — Emergency Department (HOSPITAL_COMMUNITY): Payer: Medicaid Other

## 2016-02-15 ENCOUNTER — Encounter (HOSPITAL_COMMUNITY): Payer: Self-pay | Admitting: Emergency Medicine

## 2016-02-15 ENCOUNTER — Other Ambulatory Visit: Payer: Self-pay

## 2016-02-15 DIAGNOSIS — I252 Old myocardial infarction: Secondary | ICD-10-CM | POA: Insufficient documentation

## 2016-02-15 DIAGNOSIS — R0789 Other chest pain: Secondary | ICD-10-CM | POA: Diagnosis present

## 2016-02-15 DIAGNOSIS — I2510 Atherosclerotic heart disease of native coronary artery without angina pectoris: Secondary | ICD-10-CM | POA: Insufficient documentation

## 2016-02-15 DIAGNOSIS — Z7982 Long term (current) use of aspirin: Secondary | ICD-10-CM | POA: Insufficient documentation

## 2016-02-15 DIAGNOSIS — Z79899 Other long term (current) drug therapy: Secondary | ICD-10-CM | POA: Insufficient documentation

## 2016-02-15 DIAGNOSIS — M79605 Pain in left leg: Secondary | ICD-10-CM | POA: Diagnosis not present

## 2016-02-15 LAB — BASIC METABOLIC PANEL
Anion gap: 8 (ref 5–15)
BUN: 14 mg/dL (ref 6–20)
CO2: 24 mmol/L (ref 22–32)
CREATININE: 0.7 mg/dL (ref 0.44–1.00)
Calcium: 9.8 mg/dL (ref 8.9–10.3)
Chloride: 104 mmol/L (ref 101–111)
Glucose, Bld: 97 mg/dL (ref 65–99)
POTASSIUM: 3.8 mmol/L (ref 3.5–5.1)
SODIUM: 136 mmol/L (ref 135–145)

## 2016-02-15 LAB — CBC
HEMATOCRIT: 39.5 % (ref 36.0–46.0)
Hemoglobin: 12.6 g/dL (ref 12.0–15.0)
MCH: 25.3 pg — ABNORMAL LOW (ref 26.0–34.0)
MCHC: 31.9 g/dL (ref 30.0–36.0)
MCV: 79.2 fL (ref 78.0–100.0)
PLATELETS: 395 10*3/uL (ref 150–400)
RBC: 4.99 MIL/uL (ref 3.87–5.11)
RDW: 16.6 % — AB (ref 11.5–15.5)
WBC: 14.9 10*3/uL — AB (ref 4.0–10.5)

## 2016-02-15 LAB — I-STAT TROPONIN, ED: Troponin i, poc: 0 ng/mL (ref 0.00–0.08)

## 2016-02-15 NOTE — ED Triage Notes (Signed)
Pt. reports intermittent central chest pain with SOB onset today , denies emesis or diaphoresis , pt. added left leg pain onset yesterday denies injury / ambulatory .

## 2016-02-16 ENCOUNTER — Emergency Department (HOSPITAL_COMMUNITY)
Admission: EM | Admit: 2016-02-16 | Discharge: 2016-02-16 | Disposition: A | Discharge status: Home / Self Care | Payer: Medicaid Other | Attending: Emergency Medicine | Admitting: Emergency Medicine

## 2016-02-16 DIAGNOSIS — R0789 Other chest pain: Secondary | ICD-10-CM

## 2016-02-16 DIAGNOSIS — M79605 Pain in left leg: Secondary | ICD-10-CM

## 2016-02-16 LAB — I-STAT TROPONIN, ED: TROPONIN I, POC: 0 ng/mL (ref 0.00–0.08)

## 2016-02-16 LAB — D-DIMER, QUANTITATIVE (NOT AT ARMC): D DIMER QUANT: 0.27 ug{FEU}/mL (ref 0.00–0.50)

## 2016-02-16 MED ORDER — ASPIRIN 81 MG PO CHEW
324.0000 mg | CHEWABLE_TABLET | Freq: Once | ORAL | Status: AC
  Administered 2016-02-16: 324 mg via ORAL
  Filled 2016-02-16: qty 4

## 2016-02-16 NOTE — Discharge Instructions (Signed)
We saw you in the ER for the chest pain and leg discomfort. All of our cardiac workup is normal, including labs, EKG and chest X-RAY are normal. We are not sure what is causing your discomfort, but we feel comfortable sending you home at this time. The workup in the ER is not complete, and you should follow up with your Cardiologist for further evaluation. Please return to the ER if you have worsening chest pain, shortness of breath, pain radiating to your jaw, shoulder, or back, sweats or fainting. Otherwise see the Cardiologist or your primary care doctor as requested.  For the leg pain -  you have normal pulse everywhere, and we screened you for a blood clot - and the results are normal. Please see your primary doctor if the symptoms are getting worse.

## 2016-02-16 NOTE — ED Notes (Signed)
Dr Nanavati in room  

## 2016-02-16 NOTE — ED Provider Notes (Signed)
[facility] DEPT Provider Note   CSN: [id number] Arrival date & time: 02/15/16  2117  By signing my name below, I, Soijett Blue, attest that this documentation has been prepared under the direction and in the presence of Varney Biles, MD. Electronically Signed: Soijett Blue, ED Scribe. 02/16/16. 10:20 AM.   History   Chief Complaint Chief Complaint  Patient presents with  . Chest Pain    HPI  Abigail Garcia is a 45 y.o. female with a PMHx of CAD, NSTEMI, echocardiogram, who presents to the Emergency Department complaining of intermittent, tightness, left sided, CP onset mid-day yesterday. Pt notes that her CP is non-radiating and when she has the CP episodes, it will last for seconds and go away for 1 hour. Pt denies worsening or alleviating factors for her CP. Pt reports that she had a MI in 2014 and a coronary angiogram completed that found severe blockage found to her left sided heart that was surgically removed. Pt notes that she is still followed by a cardiologist, with her last visit being this year. Pt states that with her MI in 2014 she had palpitations. She states that she has not tried any medications for the relief for her symptoms. She denies nausea, SOB, diaphoresis, and any other symptoms. Pt denies smoking cigarettes, cocaine use, ETOH use. Pt states that she takes daily 81 mg ASA.    Pt secondarily complains of intermittent, twitching/shaking, left leg pain x tightness sensation onset yesterday morning. Pt reports that she has left leg pain episodes every hour lasting several seconds. Pt notes that there is a internal twitching/shaking sensation to her left leg. Pt denies any injury or trauma. Pt states that she drove 12 hours to [place] 2 weeks ago and she is concerned for a possible blood clot. Pt has not tried any medications for the relief of her symptoms. Pt denies any other symptoms. Denies hx of DVT, PE, birth control use, or clotting disorder.    The history is  provided by the patient. No language interpreter was used.    Past Medical History:  Diagnosis Date  . Anemia   . CAD (coronary artery disease)    a. 11/2012 NSTEMI/Cath/PCI: LM nl, LAD 80-90 thrombotic (tx with Heparin x 2 days then aspiration thrombectomy and PTCA), RI nl, LCX sm/nl, RCA nl, EF 55-65%.  . Esophagitis    grade 1  . Hx of echocardiogram    a. Echo (6/14) with EF 55-60%.   . Menorrhagia   . NSTEMI (non-ST elevated myocardial infarction) ([place])    11/2012   . Obesity   . RA (rheumatoid arthritis) [facility])     Patient Active Problem List   Diagnosis Date Noted  . Prediabetes 08/18/2015  . Blurry vision, bilateral 08/18/2015  . DUB (dysfunctional uterine bleeding) 08/11/2015  . Obesity 11/11/2013  . Leukocytosis, unspecified 08/31/2013  . Thrombocytosis ([place]) 08/31/2013  . Ganglion cyst of wrist 06/12/2013  . Health care maintenance 06/12/2013  . Menorrhagia 12/28/2012  . Hyperlipidemia 12/03/2012  . CAD S/P percutaneous coronary angioplasty 11/30/2012  . Rheumatoid arthritis ([place]) 11/14/2012  . Anemia, iron deficiency 11/14/2012    Past Surgical History:  Procedure Laterality Date  . bilateral tubal    . CARDIAC CATHETERIZATION  2014  . CESAREAN SECTION    . CORONARY ANGIOGRAM  11/19/2012   Procedure: CORONARY ANGIOGRAM;  Surgeon: Peter M Martinique, MD;  Location: [facility] CATH LAB;  Service: Cardiovascular;;  . DILATION AND CURETTAGE OF UTERUS    . INTRAVASCULAR  ULTRASOUND  11/19/2012   Procedure: INTRAVASCULAR ULTRASOUND;  Surgeon: Peter M Martinique, MD;  Location: [facility] CATH LAB;  Service: Cardiovascular;;  . LEFT HEART CATHETERIZATION WITH CORONARY ANGIOGRAM N/A 11/16/2012   Procedure: LEFT HEART CATHETERIZATION WITH CORONARY ANGIOGRAM;  Surgeon: Peter M Martinique, MD;  Location: [facility] CATH LAB;  Service: Cardiovascular;  Laterality: N/A;  . PERCUTANEOUS CORONARY INTERVENTION-BALLOON ONLY  11/19/2012   Procedure: PERCUTANEOUS CORONARY INTERVENTION-BALLOON ONLY;  Surgeon: Peter M  Martinique, MD;  Location[facility] CATH LAB;  Service: Cardiovascular;;    OB History    Gravida Para Term Preterm AB Living   7 3 2 1 4 4    SAB TAB Ectopic Multiple Live Births   4 0 0 1 2       Home Medications    Prior to Admission medications   Medication Sig Start Date End Date Taking? Authorizing Provider  aspirin 81 MG EC tablet Take 1 tablet (81 mg total) by mouth daily. 06/11/13  Yes Jerrye Noble, MD  atorvastatin (LIPITOR) 80 MG tablet Take 1 tablet (80 mg total) by mouth daily. 09/25/15  Yes Imogene Burn, PA-C  iron polysaccharides (NIFEREX) 150 MG capsule Take 1 capsule (150 mg total) by mouth 2 (two) times daily. Patient taking differently: Take 150 mg by mouth daily.  09/22/15  Yes Jones Bales, MD  metoprolol tartrate (LOPRESSOR) 25 MG tablet Take 0.5 tablets (12.5 mg total) by mouth 2 (two) times daily. 11/21/14  Yes Imogene Burn, PA-C  predniSONE (DELTASONE) 5 MG tablet Take 5 mg by mouth daily with breakfast.   Yes Historical Provider, MD    Family History Family History  Problem Relation Age of Onset  . Heart disease      Social History Social History  Substance Use Topics  . Smoking status: Never Smoker  . Smokeless tobacco: Never Used  . Alcohol use No     Allergies   Review of patient's allergies indicates no known allergies.   Review of Systems Review of Systems A complete 10 system review of systems was obtained and all systems are negative except as noted in the HPI and PMH.   Physical Exam Updated Vital Signs BP 126/83   Pulse 78   Temp 99 F (37.2 C) (Oral)   Resp 17   Ht 5\' 3"  (1.6 m)   Wt 252 lb (114.3 kg)   LMP 01/29/2016 (Approximate)   SpO2 95%   BMI 44.64 kg/m   Physical Exam  Constitutional: She is oriented to person, place, and time. She appears well-developed and well-nourished. No distress.  HENT:  Head: Normocephalic and atraumatic.  Eyes: EOM are normal.  Neck: Neck supple. No JVD present.  No JVD  Cardiovascular:  Normal rate, regular rhythm and normal heart sounds.  Exam reveals no gallop and no friction rub.   No murmur heard. Pulses:      Femoral pulses are 2+ on the right side, and 2+ on the left side.      Dorsalis pedis pulses are 2+ on the right side, and 2+ on the left side.  Pt has 2+ and equal dorsalis pedis and 2+ femoral pulses bilaterally.   Pulmonary/Chest: Effort normal and breath sounds normal. No respiratory distress. She has no wheezes. She has no rales.  Lungs are clear to auscultation bilaterally.   Abdominal: Soft. She exhibits no distension and no mass. There is no tenderness.  No palpable masses.  Musculoskeletal: Normal range of motion. She exhibits no edema.  No pitting edema bilaterally. No unilateral swelling.   Neurological: She is alert and oriented to person, place, and time.  Skin: Skin is warm and dry.  Skin is warm to touch in LE without any focal hyperpigmentation.   Psychiatric: She has a normal mood and affect. Her behavior is normal.  Nursing note and vitals reviewed.    ED Treatments / Results  DIAGNOSTIC STUDIES: Oxygen Saturation is 94% on RA, adequate by my interpretation.    COORDINATION OF CARE: 2:49 AM Discussed treatment plan with pt at bedside which includes labs, EKG, CXR, and pt agreed to plan.  3:23 AM- Pt reassessed, labs discussed, and pt informed to follow up with PCP and cardiology.   Labs (all labs ordered are listed, but only abnormal results are displayed) Labs Reviewed  CBC - Abnormal; Notable for the following:       Result Value   WBC 14.9 (*)    MCH 25.3 (*)    RDW 16.6 (*)    All other components within normal limits  BASIC METABOLIC PANEL  D-DIMER, QUANTITATIVE (NOT AT [facility])  Randolm Idol, ED  Randolm Idol, ED    EKG  EKG Interpretation  Date/Time:  Thursday February 15 2016 21:23:11 EDT Ventricular Rate:  94 PR Interval:  138 QRS Duration: 72 QT Interval:  330 QTC Calculation: 412 R Axis:   -7 Text  Interpretation:  Normal sinus rhythm Possible Left atrial enlargement Borderline ECG No acute changes Confirmed by Kathrynn Humble, MD, Thelma Comp [id number]) on 02/16/2016 2:08:12 AM Also confirmed by Kathrynn Humble, MD, Thelma Comp [phone]), editor [place]  CCT, [place] ([id number])  on 02/16/2016 7:25:26 AM       Radiology Dg Chest 2 View  Result Date: 02/15/2016 CLINICAL DATA:  Left-sided chest pain today. EXAM: CHEST  2 VIEW COMPARISON:  11/29/2012 FINDINGS: The lungs are clear. The pulmonary vasculature is normal. Heart size is normal. Hilar and mediastinal contours are unremarkable. There is no pleural effusion. IMPRESSION: No active cardiopulmonary disease. Electronically Signed   By: Andreas Newport M.D.   On: 02/15/2016 22:33    Procedures Procedures (including critical care time)  Medications Ordered in ED Medications  aspirin chewable tablet 324 mg (324 mg Oral Given 02/16/16 0319)     Initial Impression / Assessment and Plan / ED Course  I have reviewed the triage vital signs and the nursing notes.  Pertinent labs & imaging results that were available during my care of the patient were reviewed by me and considered in my medical decision making (see chart for details).  Clinical Course   Pt comes in with cc of chest pain. She has extremely atypical chest discomfort  -pain comes and lasts for 2 seconds and resolves spontaneously. Pain is not evoked by exertion. Pt's pain is not similar to her NSTEMI pain - when there was pulsatile, more constant pain. Pt is having pulsatile pain in her legs though. No DVT risk factors or signs of it. Pulse is normal and there is no signs of critical limb pathology. We will get trops x 2, if neg, close cards f/u advised. Strict ER return precautions have been discussed, and patient is agreeing with the plan and is comfortable with the workup done and the recommendations from the ER.  Final Clinical Impressions(s) / ED Diagnoses   Final diagnoses:  Atypical chest pain    Leg pain, anterior, left    New Prescriptions Discharge Medication List as of 02/16/2016  3:48 AM  Varney Biles, MD 02/17/16 1023

## 2016-02-29 ENCOUNTER — Other Ambulatory Visit: Payer: Self-pay | Admitting: Hematology and Oncology

## 2016-02-29 ENCOUNTER — Other Ambulatory Visit (HOSPITAL_BASED_OUTPATIENT_CLINIC_OR_DEPARTMENT_OTHER): Payer: Medicaid Other

## 2016-02-29 ENCOUNTER — Telehealth: Payer: Self-pay | Admitting: *Deleted

## 2016-02-29 DIAGNOSIS — D509 Iron deficiency anemia, unspecified: Secondary | ICD-10-CM | POA: Diagnosis not present

## 2016-02-29 LAB — CBC & DIFF AND RETIC
BASO%: 0.5 % (ref 0.0–2.0)
BASOS ABS: 0.1 10*3/uL (ref 0.0–0.1)
EOS ABS: 0.3 10*3/uL (ref 0.0–0.5)
EOS%: 3.3 % (ref 0.0–7.0)
HEMATOCRIT: 36.3 % (ref 34.8–46.6)
HEMOGLOBIN: 11.8 g/dL (ref 11.6–15.9)
IMMATURE RETIC FRACT: 9.6 % (ref 1.60–10.00)
LYMPH#: 2.7 10*3/uL (ref 0.9–3.3)
LYMPH%: 29.6 % (ref 14.0–49.7)
MCH: 25.3 pg (ref 25.1–34.0)
MCHC: 32.5 g/dL (ref 31.5–36.0)
MCV: 77.7 fL — ABNORMAL LOW (ref 79.5–101.0)
MONO#: 0.9 10*3/uL (ref 0.1–0.9)
MONO%: 9.3 % (ref 0.0–14.0)
NEUT#: 5.3 10*3/uL (ref 1.5–6.5)
NEUT%: 57.3 % (ref 38.4–76.8)
PLATELETS: 445 10*3/uL — AB (ref 145–400)
RBC: 4.67 10*6/uL (ref 3.70–5.45)
RDW: 16.5 % — ABNORMAL HIGH (ref 11.2–14.5)
RETIC CT ABS: 114.88 10*3/uL — AB (ref 33.70–90.70)
Retic %: 2.46 % — ABNORMAL HIGH (ref 0.70–2.10)
WBC: 9.2 10*3/uL (ref 3.9–10.3)

## 2016-02-29 LAB — IRON AND TIBC
%SAT: 14 % — AB (ref 21–57)
Iron: 52 ug/dL (ref 41–142)
TIBC: 384 ug/dL (ref 236–444)
UIBC: 332 ug/dL (ref 120–384)

## 2016-02-29 LAB — FERRITIN: Ferritin: 7 ng/ml — ABNORMAL LOW (ref 9–269)

## 2016-02-29 NOTE — Telephone Encounter (Signed)
Informed pt of Dr. Calton Dach message and need for IV iron.  Need to r/s her appt from Thursday to Friday next week so we can add on for IV Iron.  Pt understands and agrees. She will come next Friday 10/6 at 11:15 am for Dr. Alvy Bimler and IV iron to be scheduled after MD visit.  Scheduling message sent.

## 2016-02-29 NOTE — Telephone Encounter (Signed)
-----   Message from Heath Lark, MD sent at 02/29/2016  1:50 PM EDT ----- Regarding: labs PLs call patient. She is profoundly iron def again Unfortunately, she did not have infusion scheduled next week. Can you ask if she can come in on Friday instead on 10/6 and see me with IV iron same day? If so, please send scheduling message to reschedule I can see her around 1115 am ----- Message ----- From: Interface, Lab In Three Zero One Sent: 02/29/2016  12:17 PM To: Heath Lark, MD

## 2016-03-07 ENCOUNTER — Ambulatory Visit: Payer: Medicaid Other | Admitting: Hematology and Oncology

## 2016-03-08 ENCOUNTER — Ambulatory Visit (HOSPITAL_BASED_OUTPATIENT_CLINIC_OR_DEPARTMENT_OTHER): Payer: Medicaid Other

## 2016-03-08 ENCOUNTER — Ambulatory Visit (HOSPITAL_BASED_OUTPATIENT_CLINIC_OR_DEPARTMENT_OTHER): Payer: Medicaid Other | Admitting: Hematology and Oncology

## 2016-03-08 ENCOUNTER — Telehealth: Payer: Self-pay

## 2016-03-08 ENCOUNTER — Encounter: Payer: Self-pay | Admitting: Hematology and Oncology

## 2016-03-08 VITALS — BP 117/76 | HR 79 | Resp 17

## 2016-03-08 DIAGNOSIS — D473 Essential (hemorrhagic) thrombocythemia: Secondary | ICD-10-CM | POA: Diagnosis not present

## 2016-03-08 DIAGNOSIS — D500 Iron deficiency anemia secondary to blood loss (chronic): Secondary | ICD-10-CM

## 2016-03-08 DIAGNOSIS — D509 Iron deficiency anemia, unspecified: Secondary | ICD-10-CM | POA: Diagnosis not present

## 2016-03-08 DIAGNOSIS — D75839 Thrombocytosis, unspecified: Secondary | ICD-10-CM

## 2016-03-08 DIAGNOSIS — Z9861 Coronary angioplasty status: Secondary | ICD-10-CM

## 2016-03-08 DIAGNOSIS — I2510 Atherosclerotic heart disease of native coronary artery without angina pectoris: Secondary | ICD-10-CM

## 2016-03-08 MED ORDER — SODIUM CHLORIDE 0.9 % IV SOLN
510.0000 mg | Freq: Once | INTRAVENOUS | Status: AC
  Administered 2016-03-08: 510 mg via INTRAVENOUS
  Filled 2016-03-08: qty 17

## 2016-03-08 MED ORDER — SODIUM CHLORIDE 0.9 % IV SOLN
Freq: Once | INTRAVENOUS | Status: AC
  Administered 2016-03-08: 13:00:00 via INTRAVENOUS

## 2016-03-08 NOTE — Patient Instructions (Signed)

## 2016-03-08 NOTE — Progress Notes (Signed)
[place] OFFICE PROGRESS NOTE  [name], DO SUMMARY OF HEMATOLOGIC HISTORY:  She was found to have abnormal CBC from routine cbc. She says she was "anemic all her life". Review of her past CBC showed her hemoglobin ranges from 7.6 to 9.0. She denies recent chest pain on exertion, shortness of breath on minimal exertion, pre-syncopal episodes, or palpitations. She does has chronic fatigue and bilateral leg cramps. Last year, she suffered from NSTEMI exacerbated from severe anemia. She had not noticed any recent bleeding such as epistaxis, hematuria or hematochezia. She had history of menorrhagia in the past. The patient denies over the counter NSAID ingestion. She was on prednisone for chronic arthritis for many years. She is on antiplatelets agents for history of heart attack. On 08/31/2013, 10/05/13 05/23/14 & 05/31/14, she was given iron infusion. Her pica resolved. She received further IV iron in April 2016, July 2016 and May 2017 INTERVAL HISTORY: Abigail Garcia 45 y.o. female returns for follow-up. She complained of fatigue. Denies pica. The patient denies any recent signs or symptoms of bleeding such as spontaneous epistaxis, hematuria or hematochezia.   I have reviewed the past medical history, past surgical history, social history and family history with the patient and they are unchanged from previous note.  ALLERGIES:  has No Known Allergies.  MEDICATIONS:  Current Outpatient Prescriptions  Medication Sig Dispense Refill  . aspirin 81 MG EC tablet Take 1 tablet (81 mg total) by mouth daily. 30 tablet 11  . atorvastatin (LIPITOR) 80 MG tablet Take 1 tablet (80 mg total) by mouth daily. 30 tablet 6  . iron polysaccharides (NIFEREX) 150 MG capsule Take 1 capsule (150 mg total) by mouth 2 (two) times daily. (Patient taking differently: Take 150 mg by mouth daily. ) 60 capsule 3  . metoprolol tartrate (LOPRESSOR) 25 MG tablet Take 0.5 tablets (12.5 mg total) by  mouth 2 (two) times daily. 60 tablet 6  . predniSONE (DELTASONE) 5 MG tablet Take 5 mg by mouth daily with breakfast.     No current facility-administered medications for this visit.      REVIEW OF SYSTEMS:   Constitutional: Denies fevers, chills or night sweats Eyes: Denies blurriness of vision Ears, nose, mouth, throat, and face: Denies mucositis or sore throat Respiratory: Denies cough, dyspnea or wheezes Cardiovascular: Denies palpitation, chest discomfort or lower extremity swelling Gastrointestinal:  Denies nausea, heartburn or change in bowel habits Skin: Denies abnormal skin rashes Lymphatics: Denies new lymphadenopathy or easy bruising Neurological:Denies numbness, tingling or new weaknesses Behavioral/Psych: Mood is stable, no new changes  All other systems were reviewed with the patient and are negative.  PHYSICAL EXAMINATION: ECOG PERFORMANCE STATUS: 0 - Asymptomatic  Vitals:   03/08/16 1138  BP: 114/67  Pulse: 78  Resp: 17  Temp: 97.8 F (36.6 C)   Filed Weights   03/08/16 1138  Weight: 255 lb 14.4 oz (116.1 kg)    GENERAL:alert, no distress and comfortable SKIN: skin color, texture, turgor are normal, no rashes or significant lesions EYES: normal, Conjunctiva are pink and non-injected, sclera clear OROPHARYNX:no exudate, no erythema and lips, buccal mucosa, and tongue normal  NECK: supple, thyroid normal size, non-tender, without nodularity LYMPH:  no palpable lymphadenopathy in the cervical, axillary or inguinal LUNGS: clear to auscultation and percussion with normal breathing effort HEART: regular rate & rhythm and no murmurs and no lower extremity edema ABDOMEN:abdomen soft, non-tender and normal bowel sounds Musculoskeletal:no cyanosis of digits and no clubbing  NEURO: alert &  oriented x 3 with fluent speech, no focal motor/sensory deficits  LABORATORY DATA:  I have reviewed the data as listed     Component Value Date/Time   NA 136 02/15/2016 2143    NA 138 09/22/2015 1130   NA 141 09/29/2013 1216   K 3.8 02/15/2016 2143   K 3.8 09/29/2013 1216   CL 104 02/15/2016 2143   CO2 24 02/15/2016 2143   CO2 23 09/29/2013 1216   GLUCOSE 97 02/15/2016 2143   GLUCOSE 103 09/29/2013 1216   BUN 14 02/15/2016 2143   BUN 11 09/22/2015 1130   BUN 10.5 09/29/2013 1216   CREATININE 0.70 02/15/2016 2143   CREATININE 0.8 09/29/2013 1216   CALCIUM 9.8 02/15/2016 2143   CALCIUM 9.7 09/29/2013 1216   PROT 7.5 09/29/2013 1216   ALBUMIN 3.4 (L) 09/29/2013 1216   AST 20 09/29/2013 1216   ALT 20 09/29/2013 1216   ALKPHOS 76 09/29/2013 1216   BILITOT 0.28 09/29/2013 1216   GFRNONAA >60 02/15/2016 2143   GFRNONAA >89 12/28/2012 1114   GFRAA >60 02/15/2016 2143   GFRAA >89 12/28/2012 1114    No results found for: SPEP, UPEP  Lab Results  Component Value Date   WBC 9.2 02/29/2016   NEUTROABS 5.3 02/29/2016   HGB 11.8 02/29/2016   HCT 36.3 02/29/2016   MCV 77.7 (L) 02/29/2016   PLT 445 (H) 02/29/2016      Chemistry      Component Value Date/Time   NA 136 02/15/2016 2143   NA 138 09/22/2015 1130   NA 141 09/29/2013 1216   K 3.8 02/15/2016 2143   K 3.8 09/29/2013 1216   CL 104 02/15/2016 2143   CO2 24 02/15/2016 2143   CO2 23 09/29/2013 1216   BUN 14 02/15/2016 2143   BUN 11 09/22/2015 1130   BUN 10.5 09/29/2013 1216   CREATININE 0.70 02/15/2016 2143   CREATININE 0.8 09/29/2013 1216      Component Value Date/Time   CALCIUM 9.8 02/15/2016 2143   CALCIUM 9.7 09/29/2013 1216   ALKPHOS 76 09/29/2013 1216   AST 20 09/29/2013 1216   ALT 20 09/29/2013 1216   BILITOT 0.28 09/29/2013 1216      ASSESSMENT & PLAN:  Anemia, iron deficiency The most likely cause of her anemia is due to chronic blood loss/malabsorption syndrome. We discussed some of the risks, benefits, and alternatives of intravenous iron infusions. The patient is symptomatic from anemia and the iron level is critically low. She tolerated oral iron supplement poorly and  desires to achieved higher levels of iron faster for adequate hematopoesis. Some of the side-effects to be expected including risks of infusion reactions, phlebitis, headaches, nausea and fatigue.  The patient is willing to proceed. Patient education material was dispensed.  Goal is to keep ferritin level greater than 100 After treatment, I plan to recheck her blood work in 4 months  Thrombocytosis This is likely reactive in nature. Observe only   CAD S/P percutaneous coronary angioplasty She has no angina today. There is no indication for blood transfusion now. She will remain on anti-platelet agents.   All questions were answered. The patient knows to call the clinic with any problems, questions or concerns. No barriers to learning was detected.  I spent 15 minutes counseling the patient face to face. The total time spent in the appointment was 20 minutes and more than 50% was on counseling.     Heath Lark, MD 10/6/20171:20 PM

## 2016-03-08 NOTE — Assessment & Plan Note (Signed)
She has no angina today. There is no indication for blood transfusion now. She will remain on anti-platelet agents.

## 2016-03-08 NOTE — Telephone Encounter (Signed)
Appointments made,avs and calendar printed  Abigail Garcia

## 2016-03-08 NOTE — Assessment & Plan Note (Signed)
The most likely cause of her anemia is due to chronic blood loss/malabsorption syndrome. We discussed some of the risks, benefits, and alternatives of intravenous iron infusions. The patient is symptomatic from anemia and the iron level is critically low. She tolerated oral iron supplement poorly and desires to achieved higher levels of iron faster for adequate hematopoesis. Some of the side-effects to be expected including risks of infusion reactions, phlebitis, headaches, nausea and fatigue.  The patient is willing to proceed. Patient education material was dispensed.  Goal is to keep ferritin level greater than 100 After treatment, I plan to recheck her blood work in 4 months

## 2016-03-08 NOTE — Assessment & Plan Note (Signed)
This is likely reactive in nature. Observe only

## 2016-03-11 ENCOUNTER — Ambulatory Visit: Payer: Medicaid Other | Admitting: Hematology and Oncology

## 2016-03-15 ENCOUNTER — Ambulatory Visit: Payer: Medicaid Other

## 2016-04-13 IMAGING — US US TRANSVAGINAL NON-OB
1 series · 15 of 25 positions shown · non-contrast
Comparison: 02/08/2013.

CLINICAL DATA: Irregular bleeding.

EXAM:
TRANSABDOMINAL AND TRANSVAGINAL ULTRASOUND OF PELVIS
TECHNIQUE: Both transabdominal and transvaginal ultrasound examinations of the
pelvis were performed. Transabdominal technique was performed for
global imaging of the pelvis including uterus, ovaries, adnexal
regions, and pelvic cul-de-sac. It was necessary to proceed with
endovaginal exam following the transabdominal exam to visualize the
uterus and ovaries.

[Series 1: us transvaginal non-ob · 15 of 69 slices shown]
[im 1/69]
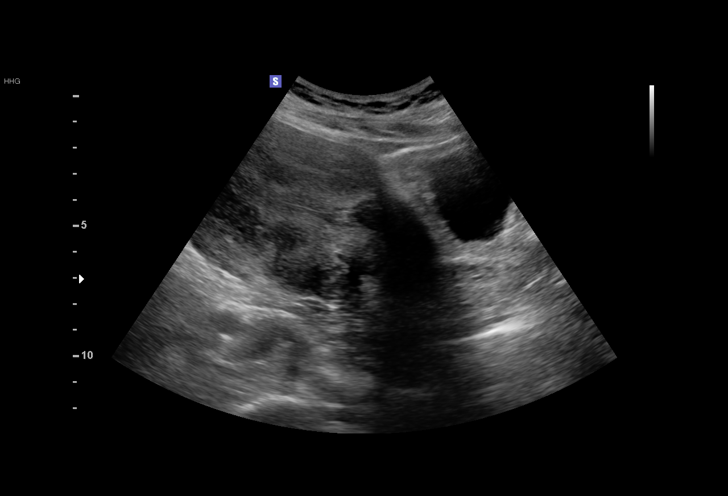
[im 6/69]
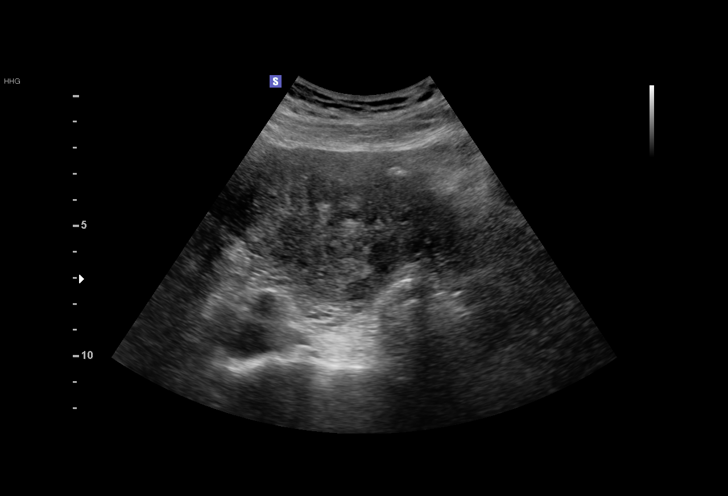
[im 12/69]
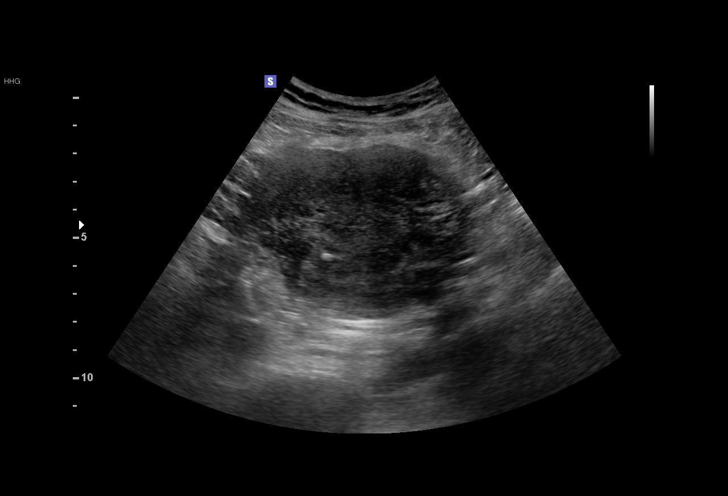
[im 15/69]
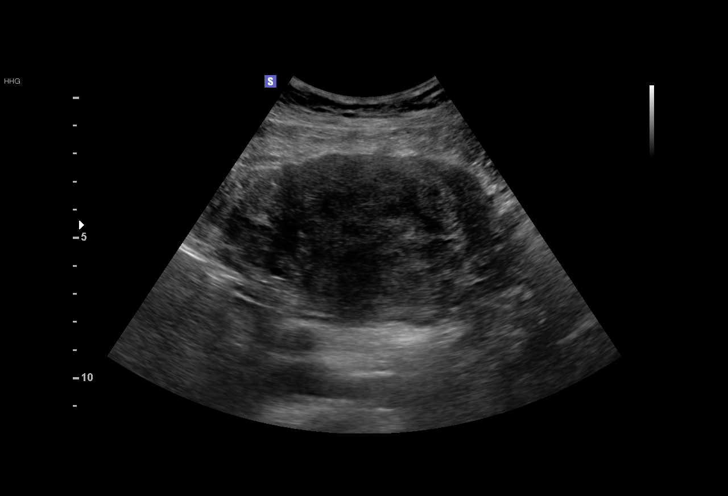
[im 20/69]
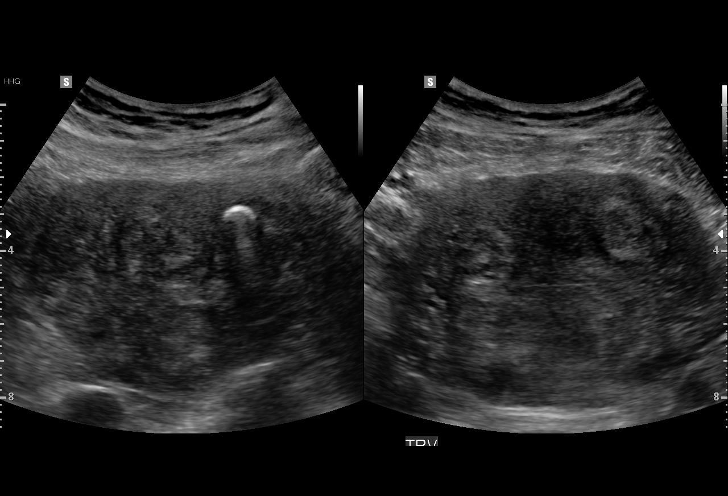
[im 26/69]
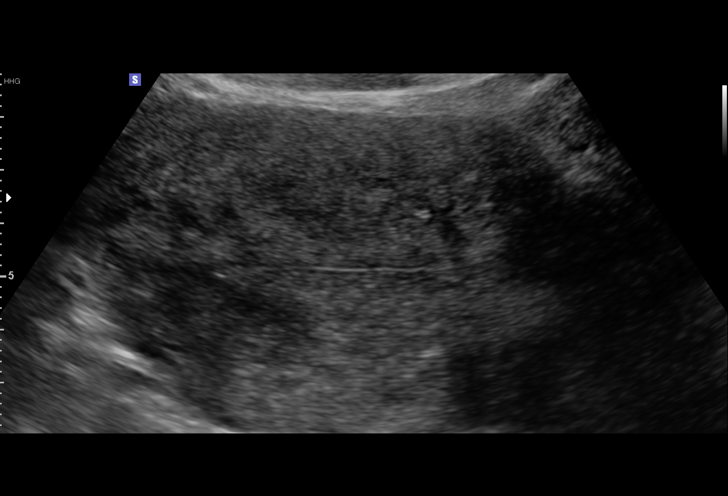
[im 29/69]
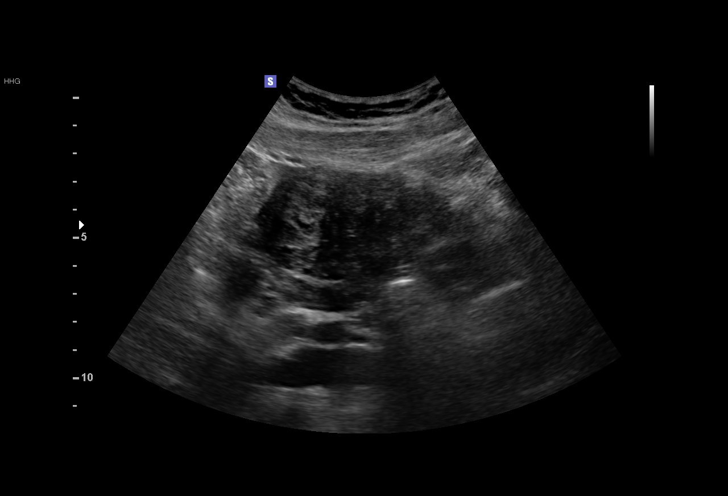
[im 35/69]
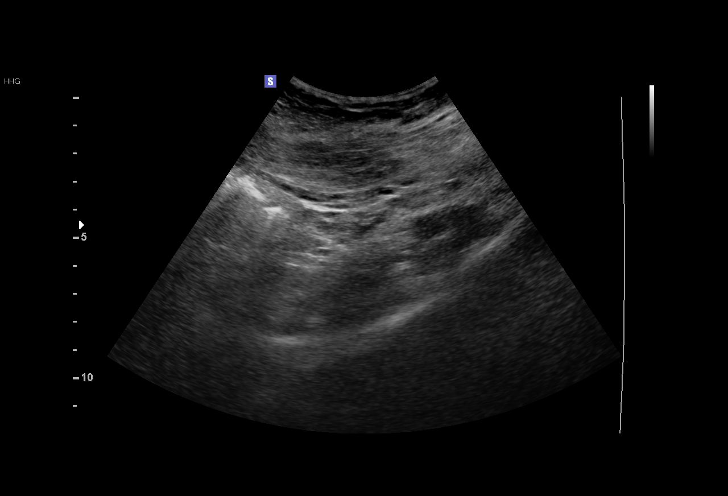
[im 40/69]
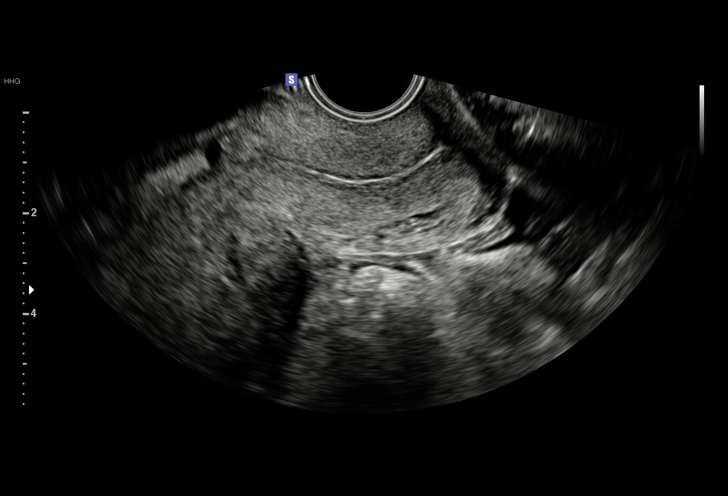
[im 43/69]
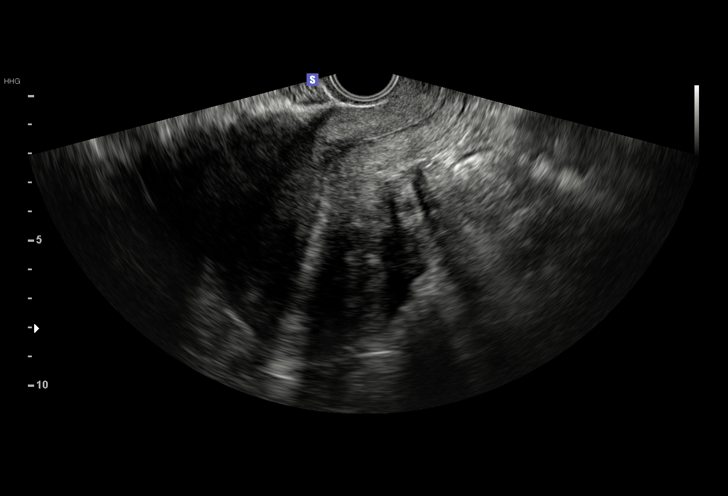
[im 49/69]
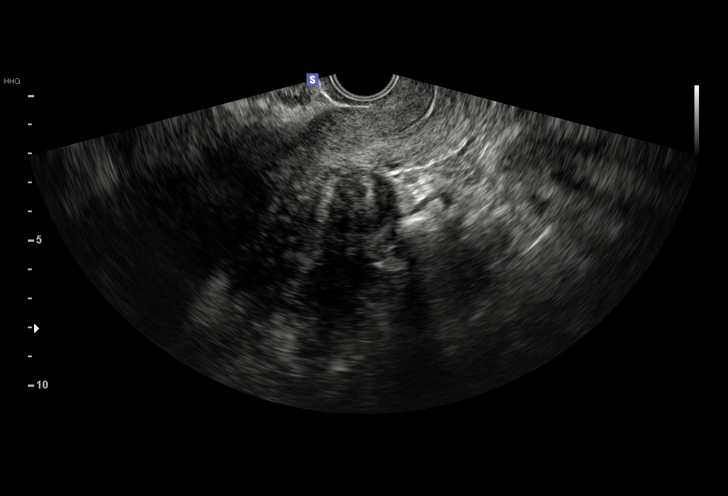
[im 54/69]
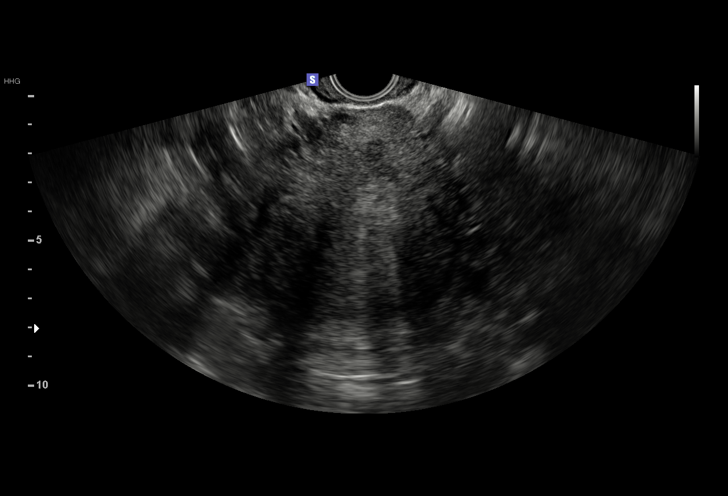
[im 57/69]
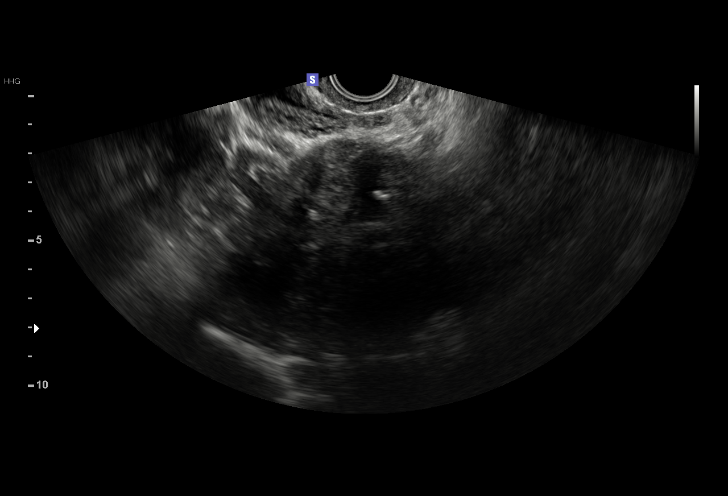
[im 63/69]
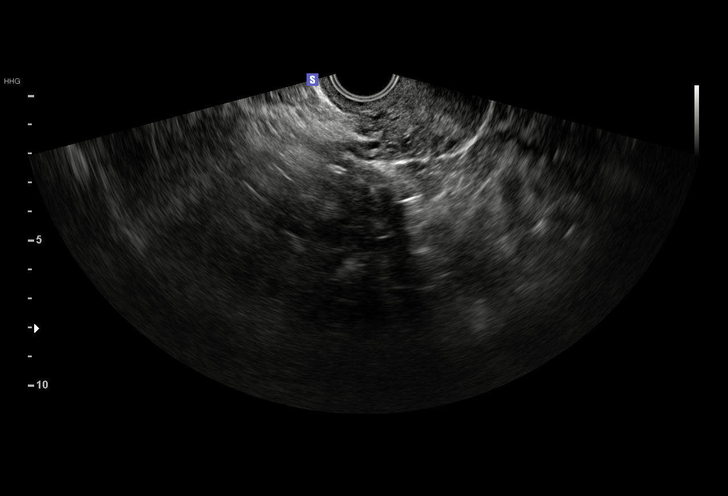
[im 69/69]
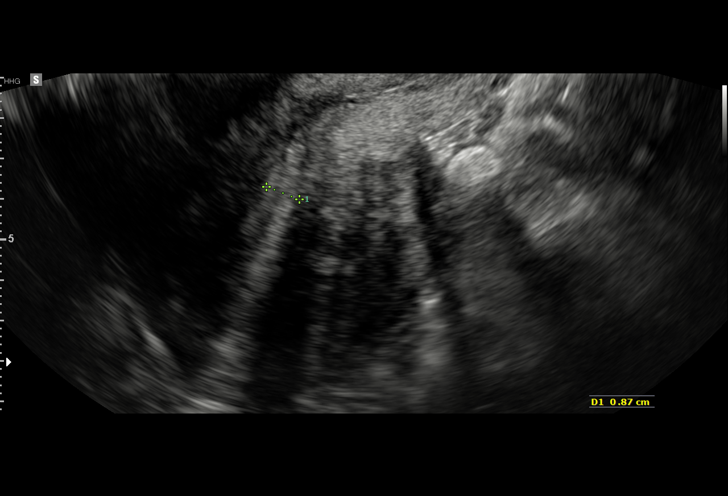

[15 of 25 positions shown; findings below may reference images not displayed]

FINDINGS: Uterus

Measurements: 13.8 x 6.6 x 9.5 cm. Two small approximately 2.8 cm
fibroids are noted.

Endometrium

Thickness: 1.2 mm.  No focal abnormality visualized.

Right ovary

Not visualized.

Left ovary

Not visualized.

Other findings

No abnormal free fluid.
IMPRESSION: Two small approximately 2.8 cm fibroid is uterine fibroids.
Endometrium is unremarkable. Ovaries not visualized. No acute or
focal abnormality. No free pelvic fluid .

## 2016-07-12 ENCOUNTER — Other Ambulatory Visit: Payer: Medicaid Other

## 2016-07-19 ENCOUNTER — Ambulatory Visit (HOSPITAL_BASED_OUTPATIENT_CLINIC_OR_DEPARTMENT_OTHER): Payer: Medicaid Other | Admitting: Hematology and Oncology

## 2016-07-19 ENCOUNTER — Telehealth: Payer: Self-pay | Admitting: *Deleted

## 2016-07-19 ENCOUNTER — Other Ambulatory Visit (HOSPITAL_BASED_OUTPATIENT_CLINIC_OR_DEPARTMENT_OTHER): Payer: Medicaid Other

## 2016-07-19 ENCOUNTER — Ambulatory Visit: Payer: Medicaid Other

## 2016-07-19 DIAGNOSIS — D509 Iron deficiency anemia, unspecified: Secondary | ICD-10-CM

## 2016-07-19 DIAGNOSIS — D500 Iron deficiency anemia secondary to blood loss (chronic): Secondary | ICD-10-CM

## 2016-07-19 LAB — CBC & DIFF AND RETIC
BASO%: 0.6 % (ref 0.0–2.0)
BASOS ABS: 0.1 10*3/uL (ref 0.0–0.1)
EOS ABS: 0.3 10*3/uL (ref 0.0–0.5)
EOS%: 4 % (ref 0.0–7.0)
HCT: 36.9 % (ref 34.8–46.6)
HEMOGLOBIN: 12.1 g/dL (ref 11.6–15.9)
IMMATURE RETIC FRACT: 6.4 % (ref 1.60–10.00)
LYMPH#: 2.7 10*3/uL (ref 0.9–3.3)
LYMPH%: 35.1 % (ref 14.0–49.7)
MCH: 26.2 pg (ref 25.1–34.0)
MCHC: 32.8 g/dL (ref 31.5–36.0)
MCV: 79.9 fL (ref 79.5–101.0)
MONO#: 0.6 10*3/uL (ref 0.1–0.9)
MONO%: 8.1 % (ref 0.0–14.0)
NEUT#: 4 10*3/uL (ref 1.5–6.5)
NEUT%: 52.2 % (ref 38.4–76.8)
PLATELETS: 354 10*3/uL (ref 145–400)
RBC: 4.62 10*6/uL (ref 3.70–5.45)
RDW: 14.8 % — ABNORMAL HIGH (ref 11.2–14.5)
RETIC CT ABS: 107.18 10*3/uL — AB (ref 33.70–90.70)
Retic %: 2.32 % — ABNORMAL HIGH (ref 0.70–2.10)
WBC: 7.7 10*3/uL (ref 3.9–10.3)

## 2016-07-19 LAB — FERRITIN: Ferritin: 9 ng/ml (ref 9–269)

## 2016-07-19 NOTE — Telephone Encounter (Signed)
Per 2/16 LOS and staff message I have scheduled appt. Notified the scheduler

## 2016-07-21 ENCOUNTER — Encounter: Payer: Self-pay | Admitting: Hematology and Oncology

## 2016-07-21 NOTE — Progress Notes (Signed)
[facility] OFFICE PROGRESS NOTE  Abigail Ser, DO SUMMARY OF HEMATOLOGIC HISTORY:  She was found to have abnormal CBC from routine cbc. She says she was "anemic all her life". Review of her past CBC showed her hemoglobin ranges from 7.6 to 9.0. She denies recent chest pain on exertion, shortness of breath on minimal exertion, pre-syncopal episodes, or palpitations. She does has chronic fatigue and bilateral leg cramps. Last year, she suffered from NSTEMI exacerbated from severe anemia. She had not noticed any recent bleeding such as epistaxis, hematuria or hematochezia. She had history of menorrhagia in the past. The patient denies over the counter NSAID ingestion. She was on prednisone for chronic arthritis for many years. She is on antiplatelets agents for history of heart attack. On 08/31/2013, 10/05/13 05/23/14 & 05/31/14, she was given iron infusion. Her pica resolved. She received further IV iron in 2016, July 2016, May 2017 and October 2017 INTERVAL HISTORY: Abigail Garcia 46 y.o. female returns for further workup. The patient did not have blood work done a week ahead of time. She complained of mild fatigue. The patient denies any recent signs or symptoms of bleeding such as spontaneous epistaxis, hematuria or hematochezia.   I have reviewed the past medical history, past surgical history, social history and family history with the patient and they are unchanged from previous note.  ALLERGIES:  has No Known Allergies.  MEDICATIONS:  Current Outpatient Prescriptions  Medication Sig Dispense Refill  . aspirin 81 MG EC tablet Take 1 tablet (81 mg total) by mouth daily. 30 tablet 11  . atorvastatin (LIPITOR) 80 MG tablet Take 1 tablet (80 mg total) by mouth daily. 30 tablet 6  . iron polysaccharides (NIFEREX) 150 MG capsule Take 1 capsule (150 mg total) by mouth 2 (two) times daily. (Patient taking differently: Take 150 mg by mouth daily. ) 60 capsule 3  . metoprolol  tartrate (LOPRESSOR) 25 MG tablet Take 0.5 tablets (12.5 mg total) by mouth 2 (two) times daily. 60 tablet 6  . predniSONE (DELTASONE) 5 MG tablet Take 5 mg by mouth daily with breakfast.     No current facility-administered medications for this visit.      REVIEW OF SYSTEMS:   Constitutional: Denies fevers, chills or night sweats Eyes: Denies blurriness of vision Ears, nose, mouth, throat, and face: Denies mucositis or sore throat Respiratory: Denies cough, dyspnea or wheezes Cardiovascular: Denies palpitation, chest discomfort or lower extremity swelling Gastrointestinal:  Denies nausea, heartburn or change in bowel habits Skin: Denies abnormal skin rashes Lymphatics: Denies new lymphadenopathy or easy bruising Neurological:Denies numbness, tingling or new weaknesses Behavioral/Psych: Mood is stable, no new changes  All other systems were reviewed with the patient and are negative.  PHYSICAL EXAMINATION: ECOG PERFORMANCE STATUS: 0 - Asymptomatic GENERAL:alert, no distress and comfortable SKIN: skin color, texture, turgor are normal, no rashes or significant lesions EYES: normal, Conjunctiva are pink and non-injected, sclera clear OROPHARYNX:no exudate, no erythema and lips, buccal mucosa, and tongue normal  NECK: supple, thyroid normal size, non-tender, without nodularity LYMPH:  no palpable lymphadenopathy in the cervical, axillary or inguinal LUNGS: clear to auscultation and percussion with normal breathing effort HEART: regular rate & rhythm and no murmurs and no lower extremity edema ABDOMEN:abdomen soft, non-tender and normal bowel sounds Musculoskeletal:no cyanosis of digits and no clubbing  NEURO: alert & oriented x 3 with fluent speech, no focal motor/sensory deficits  LABORATORY DATA:  I have reviewed the data as listed     Component  Value Date/Time   NA 136 02/15/2016 2143   NA 138 09/22/2015 1130   NA 141 09/29/2013 1216   K 3.8 02/15/2016 2143   K 3.8  09/29/2013 1216   CL 104 02/15/2016 2143   CO2 24 02/15/2016 2143   CO2 23 09/29/2013 1216   GLUCOSE 97 02/15/2016 2143   GLUCOSE 103 09/29/2013 1216   BUN 14 02/15/2016 2143   BUN 11 09/22/2015 1130   BUN 10.5 09/29/2013 1216   CREATININE 0.70 02/15/2016 2143   CREATININE 0.8 09/29/2013 1216   CALCIUM 9.8 02/15/2016 2143   CALCIUM 9.7 09/29/2013 1216   PROT 7.5 09/29/2013 1216   ALBUMIN 3.4 (L) 09/29/2013 1216   AST 20 09/29/2013 1216   ALT 20 09/29/2013 1216   ALKPHOS 76 09/29/2013 1216   BILITOT 0.28 09/29/2013 1216   GFRNONAA >60 02/15/2016 2143   GFRNONAA >89 12/28/2012 1114   GFRAA >60 02/15/2016 2143   GFRAA >89 12/28/2012 1114    No results found for: SPEP, UPEP  Lab Results  Component Value Date   WBC 7.7 07/19/2016   NEUTROABS 4.0 07/19/2016   HGB 12.1 07/19/2016   HCT 36.9 07/19/2016   MCV 79.9 07/19/2016   PLT 354 07/19/2016      Chemistry      Component Value Date/Time   NA 136 02/15/2016 2143   NA 138 09/22/2015 1130   NA 141 09/29/2013 1216   K 3.8 02/15/2016 2143   K 3.8 09/29/2013 1216   CL 104 02/15/2016 2143   CO2 24 02/15/2016 2143   CO2 23 09/29/2013 1216   BUN 14 02/15/2016 2143   BUN 11 09/22/2015 1130   BUN 10.5 09/29/2013 1216   CREATININE 0.70 02/15/2016 2143   CREATININE 0.8 09/29/2013 1216      Component Value Date/Time   CALCIUM 9.8 02/15/2016 2143   CALCIUM 9.7 09/29/2013 1216   ALKPHOS 76 09/29/2013 1216   AST 20 09/29/2013 1216   ALT 20 09/29/2013 1216   BILITOT 0.28 09/29/2013 1216      ASSESSMENT & PLAN:  Anemia, iron deficiency The patient is not anemic today. Even though serum iron is low, based on current guidelines, she does not meet the criteria for intravenous iron replacement therapy. I recommend rescheduling her appointment with repeat blood work in approximately 3 months. She will return to have blood work done 1 week ahead of time.    Orders Placed This Encounter  Procedures  . Ferritin    Standing  Status:   Future    Standing Expiration Date:   07/19/2017  . CBC & Diff and Retic    Standing Status:   Future    Standing Expiration Date:   08/23/2017    All questions were answered. The patient knows to call the clinic with any problems, questions or concerns. No barriers to learning was detected.  I spent 6 minutes counseling the patient face to face. The total time spent in the appointment was 10 minutes and more than 50% was on counseling.     Heath Lark, MD 2/18/20185:55 AM

## 2016-07-21 NOTE — Assessment & Plan Note (Signed)
The patient is not anemic today. Even though serum iron is low, based on current guidelines, she does not meet the criteria for intravenous iron replacement therapy. I recommend rescheduling her appointment with repeat blood work in approximately 3 months. She will return to have blood work done 1 week ahead of time.

## 2016-10-04 IMAGING — DX DG CHEST 2V
2 series · 2 of 2 positions shown · non-contrast
Comparison: 11/29/2012

CLINICAL DATA: Left-sided chest pain today.

EXAM:
CHEST  2 VIEW

[chest pa]
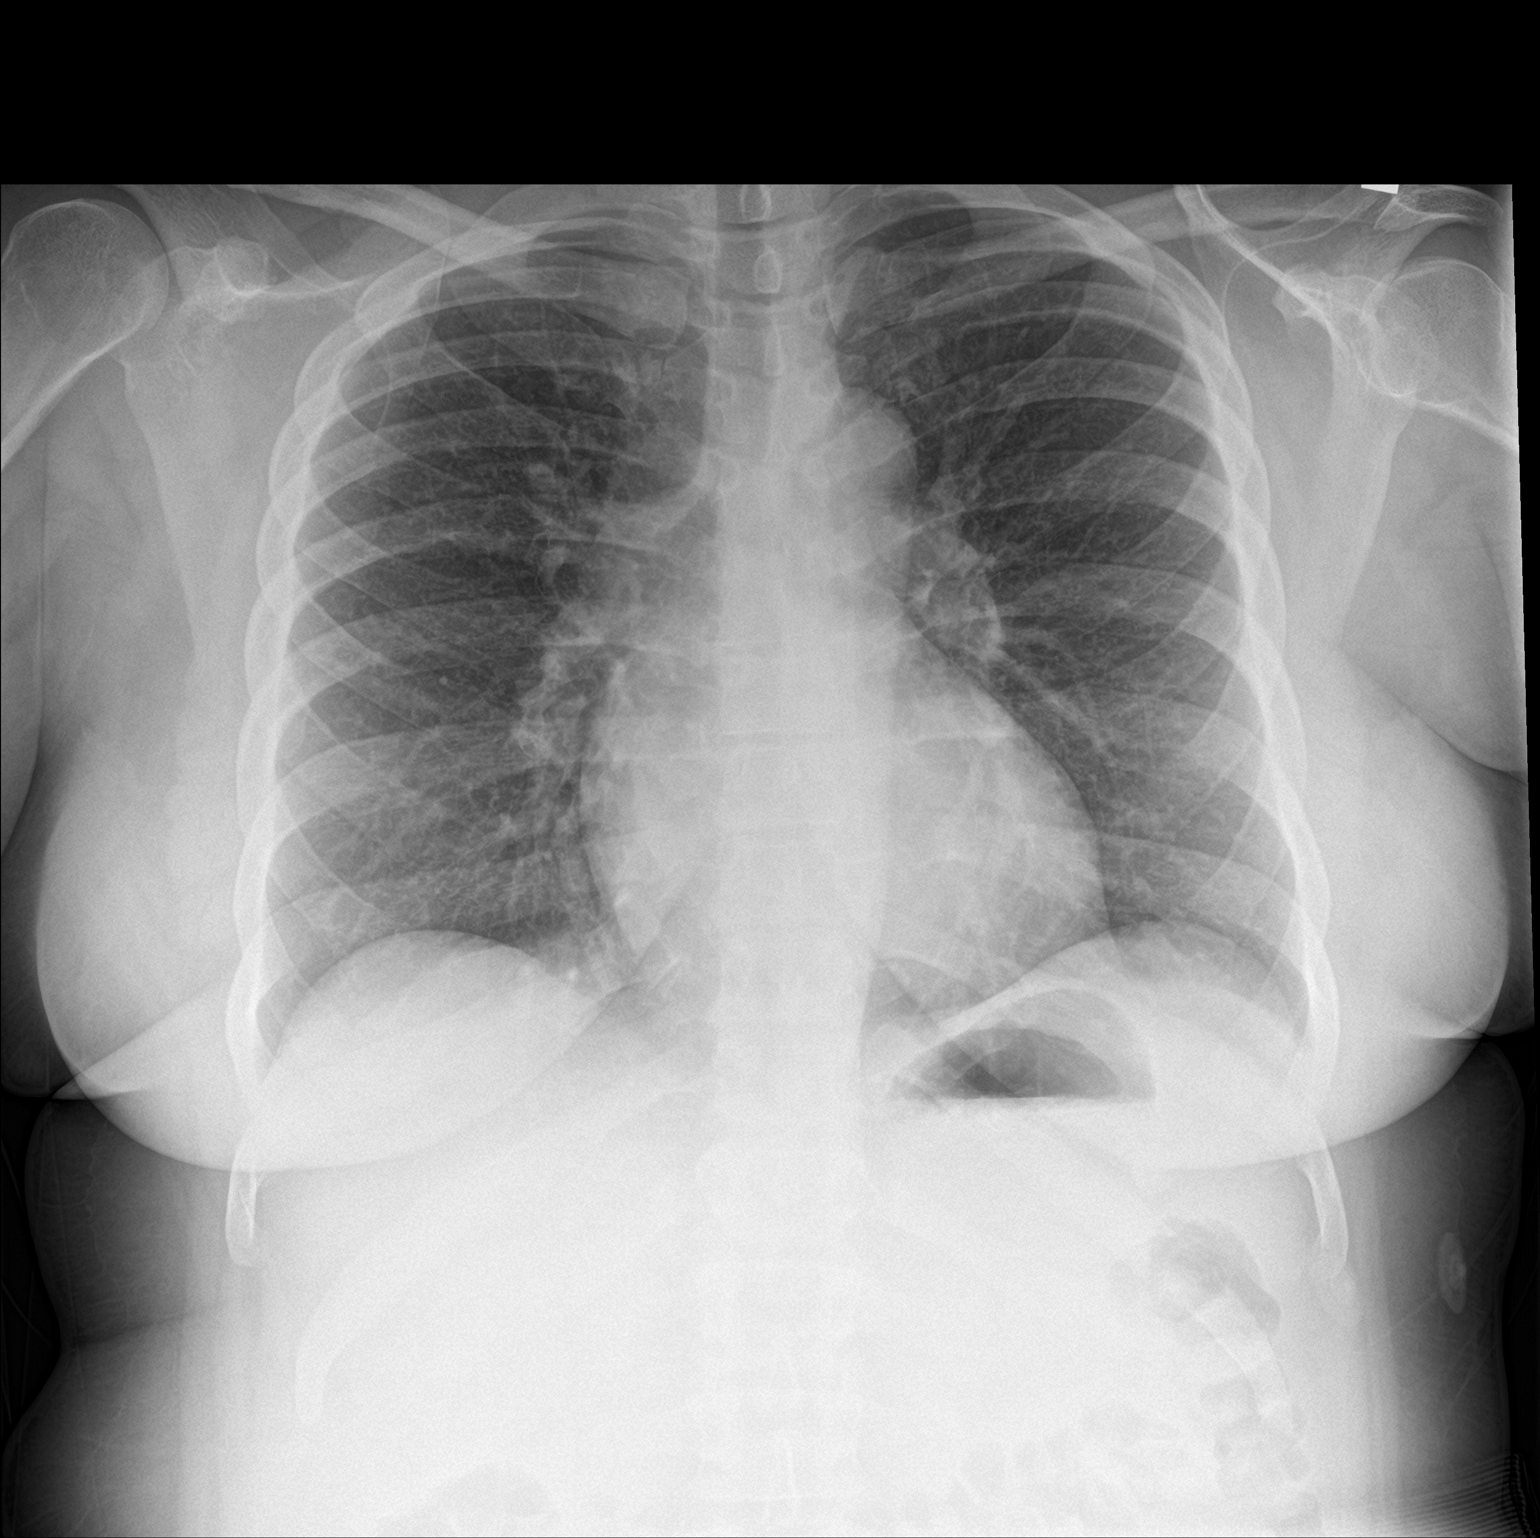

[chest lat]
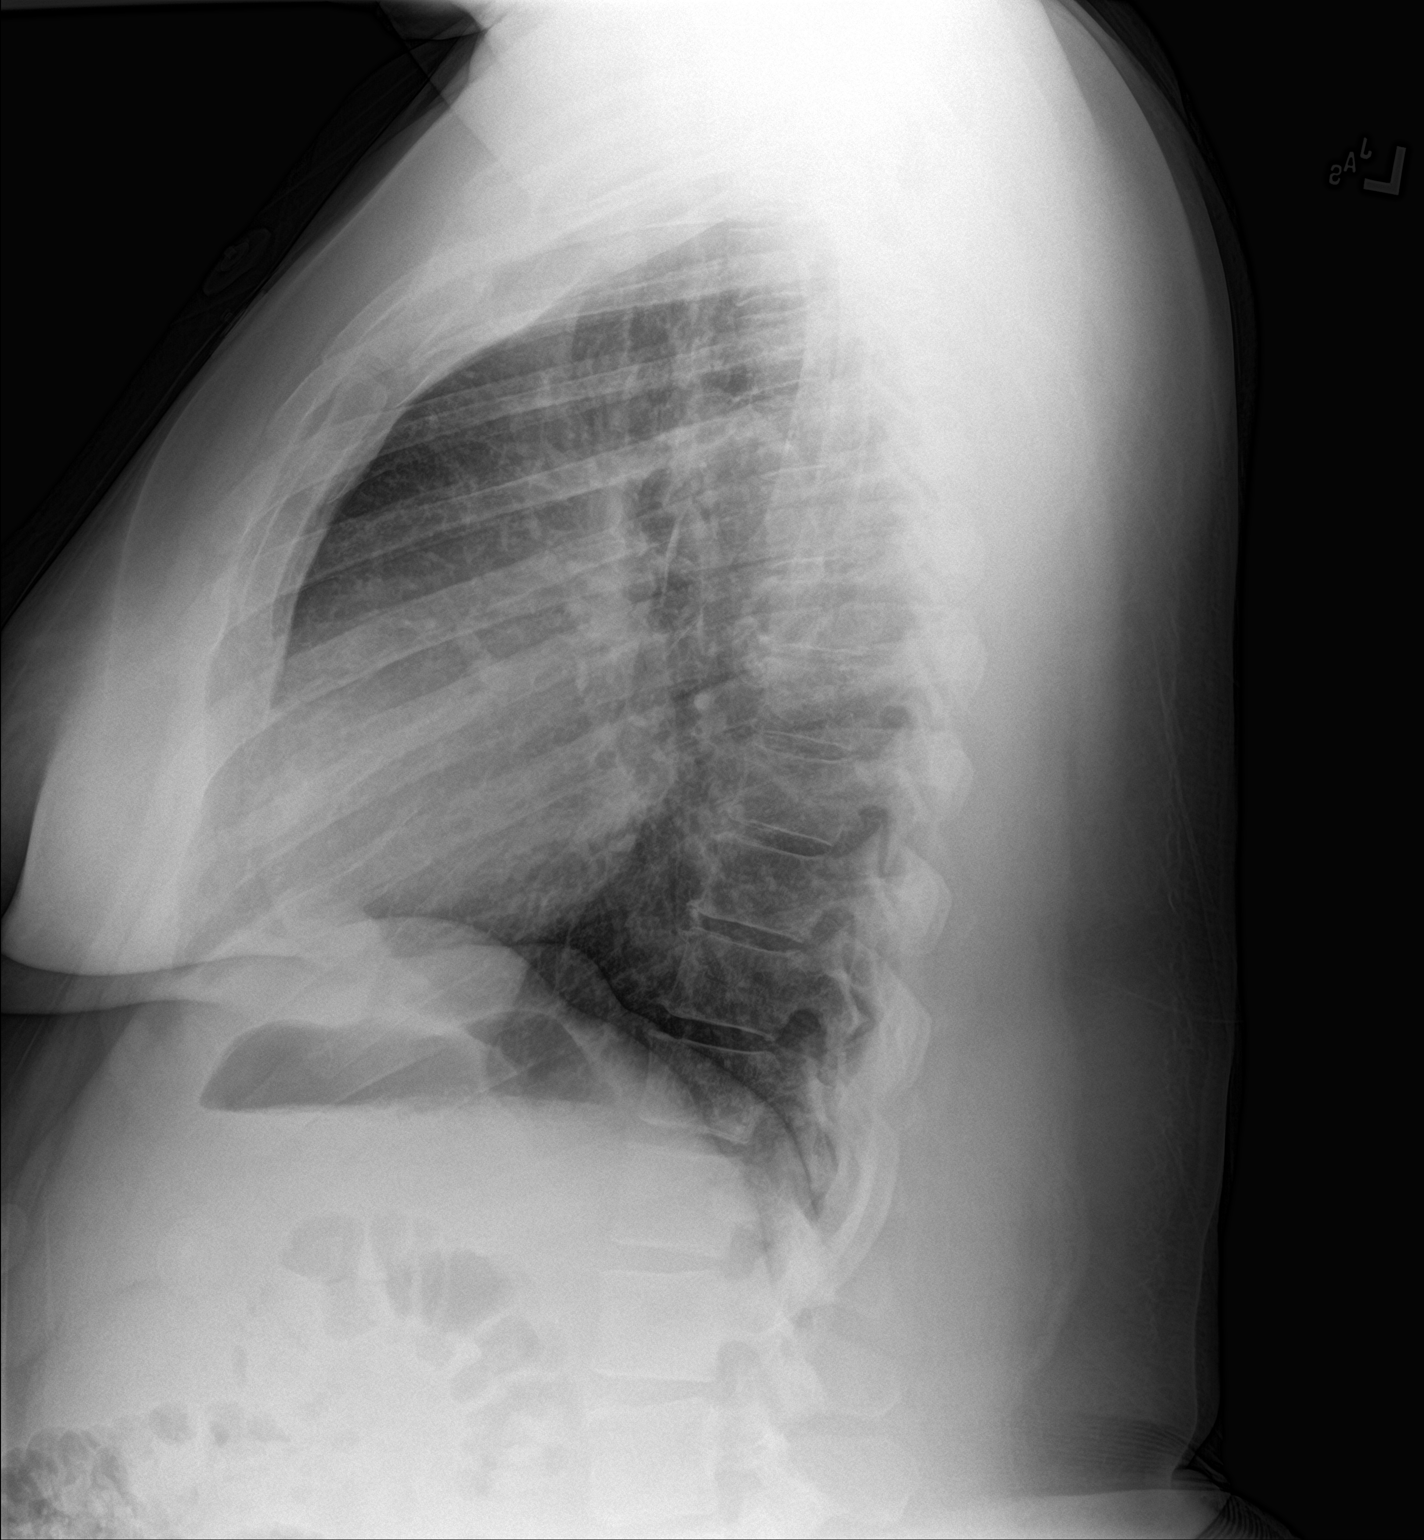

[2 of 2 positions shown; findings below may reference images not displayed]

FINDINGS: The lungs are clear. The pulmonary vasculature is normal. Heart size
is normal. Hilar and mediastinal contours are unremarkable. There is
no pleural effusion.
IMPRESSION: No active cardiopulmonary disease.

## 2016-10-11 ENCOUNTER — Other Ambulatory Visit: Payer: Medicaid Other

## 2016-10-17 ENCOUNTER — Telehealth: Payer: Self-pay

## 2016-10-17 NOTE — Telephone Encounter (Signed)
Called patient about appt tomorrow. She states that she is going to have to reschedule. She is unable to make appt tomorrow.

## 2016-10-18 ENCOUNTER — Ambulatory Visit: Payer: Medicaid Other | Admitting: Hematology and Oncology

## 2016-10-18 ENCOUNTER — Ambulatory Visit: Payer: Medicaid Other

## 2017-01-09 ENCOUNTER — Ambulatory Visit (INDEPENDENT_AMBULATORY_CARE_PROVIDER_SITE_OTHER): Payer: Medicaid Other | Admitting: Internal Medicine

## 2017-01-09 ENCOUNTER — Encounter: Payer: Self-pay | Admitting: Internal Medicine

## 2017-01-09 VITALS — BP 107/69 | HR 85 | Temp 97.8°F | Ht 63.0 in | Wt 264.4 lb

## 2017-01-09 DIAGNOSIS — Z8249 Family history of ischemic heart disease and other diseases of the circulatory system: Secondary | ICD-10-CM | POA: Diagnosis not present

## 2017-01-09 DIAGNOSIS — Z0000 Encounter for general adult medical examination without abnormal findings: Secondary | ICD-10-CM

## 2017-01-09 DIAGNOSIS — I2510 Atherosclerotic heart disease of native coronary artery without angina pectoris: Secondary | ICD-10-CM | POA: Diagnosis present

## 2017-01-09 DIAGNOSIS — Z7982 Long term (current) use of aspirin: Secondary | ICD-10-CM

## 2017-01-09 DIAGNOSIS — Z6841 Body Mass Index (BMI) 40.0 and over, adult: Secondary | ICD-10-CM

## 2017-01-09 DIAGNOSIS — Z791 Long term (current) use of non-steroidal anti-inflammatories (NSAID): Secondary | ICD-10-CM

## 2017-01-09 DIAGNOSIS — Z7251 High risk heterosexual behavior: Secondary | ICD-10-CM

## 2017-01-09 DIAGNOSIS — M06041 Rheumatoid arthritis without rheumatoid factor, right hand: Secondary | ICD-10-CM

## 2017-01-09 DIAGNOSIS — Z9861 Coronary angioplasty status: Secondary | ICD-10-CM | POA: Diagnosis not present

## 2017-01-09 DIAGNOSIS — M06042 Rheumatoid arthritis without rheumatoid factor, left hand: Secondary | ICD-10-CM | POA: Diagnosis not present

## 2017-01-09 DIAGNOSIS — E66813 Obesity, class 3: Secondary | ICD-10-CM

## 2017-01-09 DIAGNOSIS — Z79899 Other long term (current) drug therapy: Secondary | ICD-10-CM | POA: Diagnosis not present

## 2017-01-09 MED ORDER — MELOXICAM 7.5 MG PO TABS: 7.5000 mg | ORAL_TABLET | Freq: Every day | ORAL | 2 refills | Status: DC

## 2017-01-09 NOTE — Assessment & Plan Note (Signed)
Assessment: She is adherent to her BB, ASA, and statin.  No complaints of chest pain.  Due for follow up with cardiology.  Plan: - she will continue current regimen and follow up as planned with cardiology.

## 2017-01-09 NOTE — Assessment & Plan Note (Signed)
Check HIV 

## 2017-01-09 NOTE — Patient Instructions (Signed)
Thank you for coming to see me today. It was a pleasure. Today we talked about:   Rheumatoid arthritis: I have placed a referral to rheumatology for you.  They will be contacting you for an appointment.  Please schedule an appointment with your heart doctor for follow up.  Please follow-up with me in 12 months.  If you have any questions or concerns, please do not hesitate to call the office at [phone].  Take Care,   [name], DO

## 2017-01-09 NOTE — Assessment & Plan Note (Signed)
Assessment: She has been gaining weight per chart review and is interested in weight loss.  She reports her clothes still fit okay but has noticed the weight gain.  Has noticed more weight gain since stopping prednisone.  She is not exericising regularly due to RA pain.  She denies orthopnea, worsening DOE from her baseline, or worsening edema.  Last Echo was okay.  TSH normal in 2014.  She is interested in nutrition counseling. Body mass index is 46.84 kg/m.   Plan: - encouraged exercise as tolerated - referral placed to medical nutrition counseling - likely reasonable to check TSH next visit

## 2017-01-09 NOTE — Assessment & Plan Note (Signed)
Assessment: Patient formerly seen by Dr. Charlestine Night.  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  Seen at [facility] in Aug 2017 and tapered off prednisone, started on Arava.  Currently on neither.  Requesting referral to new rheumatologist here in [place].  Requesting something for pain and reports doing well in Meloxicam in the past.  Plan: - referral placed to rheumatology - Meloxicam 7.5mg  daily

## 2017-01-09 NOTE — Progress Notes (Signed)
   CC: here for f/u CAD and concern of weight gain  HPI:  [name] is a 46 y.o. woman with a past medical history listed below here today for follow up of her CAD, RA, and concern for weight gain.  For details of today's visit and the status of her chronic medical issues please refer to the assessment and plan.   Past Medical History:  Diagnosis Date  . Anemia   . CAD (coronary artery disease)    a. 11/2012 NSTEMI/Cath/PCI: LM nl, LAD 80-90 thrombotic (tx with Heparin x 2 days then aspiration thrombectomy and PTCA), RI nl, LCX sm/nl, RCA nl, EF 55-65%.  . Esophagitis    grade 1  . Hx of echocardiogram    a. Echo (6/14) with EF 55-60%.   . Menorrhagia   . NSTEMI (non-ST elevated myocardial infarction) ([place])    11/2012   . Obesity   . RA (rheumatoid arthritis) ([place])    Review of Systems:  Please see pertinent ROS reviewed in HPI and problem based charting.   Physical Exam:  Vitals:   01/09/17 1440  BP: 107/69  Pulse: 85  Temp: 97.8 F (36.6 C)  TempSrc: Oral  SpO2: 98%  Weight: 264 lb 6.4 oz (119.9 kg)  Height: 5\' 3"  (1.6 m)   General: sitting in chair, moderately overweight, pleasant, NAD HEENT: NCAT, EOMI, no scleral icterus Cardiac: RRR Pulm: normal effort Abd: soft, nontender, nondistended, BS present Ext: edema of bilateral wrists w/ decreased passive ROM, trace lower extremity edema, synovitis of PIP joints bilaterally, mild ulnar deviation Neuro: alert and oriented X3, cranial nerves II-XII grossly intact  Psych: normal mood and affect.  Assessment & Plan:   See Encounters Tab for problem based charting.  Patient discussed with Dr. Daryll Drown .  Obesity Assessment: She has been gaining weight per chart review and is interested in weight loss.  She reports her clothes still fit okay but has noticed the weight gain.  Has noticed more weight gain since stopping prednisone.  She is not exericising regularly due to RA pain.  She denies orthopnea, worsening DOE  from her baseline, or worsening edema.  Last Echo was okay.  TSH normal in 2014.  She is interested in nutrition counseling. Body mass index is 46.84 kg/m.   Plan: - encouraged exercise as tolerated - referral placed to medical nutrition counseling - likely reasonable to check TSH next visit   Health care maintenance Check HIV  Rheumatoid arthritis Assessment: Patient formerly seen by Dr. Charlestine Night.  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  Seen at [facility] in Aug 2017 and tapered off prednisone, started on Arava.  Currently on neither.  Requesting referral to new rheumatologist here in [place].  Requesting something for pain and reports doing well in Meloxicam in the past.  Plan: - referral placed to rheumatology - Meloxicam 7.5mg  daily  CAD S/P percutaneous coronary angioplasty Assessment: She is adherent to her BB, ASA, and statin.  No complaints of chest pain.  Due for follow up with cardiology.  Plan: - she will continue current regimen and follow up as planned with cardiology.

## 2017-01-10 LAB — HIV ANTIBODY (ROUTINE TESTING W REFLEX): HIV SCREEN 4TH GENERATION: NONREACTIVE

## 2017-01-10 NOTE — Addendum Note (Signed)
Addended by: Gilles Chiquito B on: 01/10/2017 03:55 PM   Modules accepted: Level of Service

## 2017-01-10 NOTE — Progress Notes (Signed)
Internal Medicine Clinic Attending  Case discussed with Dr. Wallace at the time of the visit.  We reviewed the resident's history and exam and pertinent patient test results.  I agree with the assessment, diagnosis, and plan of care documented in the resident's note.  

## 2017-01-28 ENCOUNTER — Encounter: Payer: Self-pay | Admitting: Internal Medicine

## 2017-02-13 ENCOUNTER — Encounter: Payer: Medicaid Other | Admitting: Dietician

## 2017-03-13 NOTE — Addendum Note (Signed)
Addended by: Hulan Fray on: 03/13/2017 04:56 PM   Modules accepted: Orders

## 2017-03-20 ENCOUNTER — Encounter (HOSPITAL_COMMUNITY): Payer: Self-pay | Admitting: Emergency Medicine

## 2017-03-20 ENCOUNTER — Emergency Department (HOSPITAL_COMMUNITY): Payer: Medicaid Other

## 2017-03-20 ENCOUNTER — Emergency Department (HOSPITAL_COMMUNITY)
Admission: EM | Admit: 2017-03-20 | Discharge: 2017-03-20 | Disposition: A | Discharge status: Home / Self Care | Payer: Medicaid Other | Attending: Emergency Medicine | Admitting: Emergency Medicine

## 2017-03-20 DIAGNOSIS — R079 Chest pain, unspecified: Secondary | ICD-10-CM | POA: Insufficient documentation

## 2017-03-20 DIAGNOSIS — Z79899 Other long term (current) drug therapy: Secondary | ICD-10-CM | POA: Diagnosis not present

## 2017-03-20 DIAGNOSIS — I2510 Atherosclerotic heart disease of native coronary artery without angina pectoris: Secondary | ICD-10-CM | POA: Insufficient documentation

## 2017-03-20 DIAGNOSIS — Z7982 Long term (current) use of aspirin: Secondary | ICD-10-CM | POA: Insufficient documentation

## 2017-03-20 LAB — CBC
HEMATOCRIT: 36.6 % (ref 36.0–46.0)
HEMOGLOBIN: 11.6 g/dL — AB (ref 12.0–15.0)
MCH: 23.9 pg — AB (ref 26.0–34.0)
MCHC: 31.7 g/dL (ref 30.0–36.0)
MCV: 75.3 fL — ABNORMAL LOW (ref 78.0–100.0)
Platelets: 457 10*3/uL — ABNORMAL HIGH (ref 150–400)
RBC: 4.86 MIL/uL (ref 3.87–5.11)
RDW: 16.2 % — ABNORMAL HIGH (ref 11.5–15.5)
WBC: 12.2 10*3/uL — ABNORMAL HIGH (ref 4.0–10.5)

## 2017-03-20 LAB — BASIC METABOLIC PANEL
ANION GAP: 9 (ref 5–15)
BUN: 13 mg/dL (ref 6–20)
CO2: 23 mmol/L (ref 22–32)
Calcium: 9.1 mg/dL (ref 8.9–10.3)
Chloride: 101 mmol/L (ref 101–111)
Creatinine, Ser: 0.75 mg/dL (ref 0.44–1.00)
GLUCOSE: 103 mg/dL — AB (ref 65–99)
POTASSIUM: 3.5 mmol/L (ref 3.5–5.1)
Sodium: 133 mmol/L — ABNORMAL LOW (ref 135–145)

## 2017-03-20 LAB — I-STAT TROPONIN, ED
TROPONIN I, POC: 0.01 ng/mL (ref 0.00–0.08)
Troponin i, poc: 0 ng/mL (ref 0.00–0.08)

## 2017-03-20 MED ORDER — MORPHINE SULFATE (PF) 4 MG/ML IV SOLN
1.0000 mg | Freq: Once | INTRAVENOUS | Status: DC
  Filled 2017-03-20: qty 1

## 2017-03-20 MED ORDER — NITROGLYCERIN 0.4 MG SL SUBL
0.4000 mg | SUBLINGUAL_TABLET | SUBLINGUAL | Status: DC | PRN
Start: 2017-03-20 — End: 2017-03-20
  Administered 2017-03-20: 0.4 mg via SUBLINGUAL
  Filled 2017-03-20: qty 1

## 2017-03-20 NOTE — Discharge Instructions (Signed)
You were seen at [facility] ED for chest pain. Your blood work was normal. We recommend that you follow-up with your cardiologist as you may need further workup. Return to the ED if you start to experience worsening chest pain that is associated with shortness of breath, nausea, vomiting, or sweating.

## 2017-03-20 NOTE — ED Provider Notes (Signed)
[facility] EMERGENCY DEPARTMENT Provider Note   CSN: [id number] Arrival date & time: 03/20/17  7425    History   Chief Complaint Chief Complaint  Patient presents with  . Chest Pain    HPI Abigail Garcia is a 46 y.o. female with history of CAD s/p tx with heparin, aspiration thrombectomy and PTCA, NSTEMI 2014, HLD, IDA, prediabetes, RA who presents to the ED with chest pain. Patient states she was in her usual state of health until this morning when she woke up. She started to experience LUQ abdominal pain that she describes as mild. It did not wake her from sleep.The pain then radiated to her chest. She describes the pain as nonradiating, pressure-like chest pain that was associated with mild shortness of breath. It did not improve with rest or worsen with exertion. She proceeded to take her morning dose of metoprolol and baby aspirin without resolution of symptoms. Denies nausea, vomiting, or diaphoresis at that time. States to pain feels similar to her prior NSTEMI but not as severe. She has not been seen by cardiology since 2017 reports compliance with all her home medications. Denies recent illness, fevers, chills, headache, shortness of breath, abdominal pain, nausea, vomiting, changes in bowel movements, or lower extremity swelling.  HPI  Past Medical History:  Diagnosis Date  . Anemia   . CAD (coronary artery disease)    a. 11/2012 NSTEMI/Cath/PCI: LM nl, LAD 80-90 thrombotic (tx with Heparin x 2 days then aspiration thrombectomy and PTCA), RI nl, LCX sm/nl, RCA nl, EF 55-65%.  . Esophagitis    grade 1  . Hx of echocardiogram    a. Echo (6/14) with EF 55-60%.   . Menorrhagia   . NSTEMI (non-ST elevated myocardial infarction) ([place])    11/2012   . Obesity   . RA (rheumatoid arthritis) [facility])     Patient Active Problem List   Diagnosis Date Noted  . Prediabetes 08/18/2015  . Blurry vision, bilateral 08/18/2015  . DUB (dysfunctional uterine bleeding)  08/11/2015  . Obesity 11/11/2013  . Leukocytosis, unspecified 08/31/2013  . Thrombocytosis ([place]) 08/31/2013  . Ganglion cyst of wrist 06/12/2013  . Health care maintenance 06/12/2013  . Menorrhagia 12/28/2012  . Hyperlipidemia 12/03/2012  . CAD S/P percutaneous coronary angioplasty 11/30/2012  . Rheumatoid arthritis ([place]) 11/14/2012  . Anemia, iron deficiency 11/14/2012    Past Surgical History:  Procedure Laterality Date  . bilateral tubal    . CARDIAC CATHETERIZATION  2014  . CESAREAN SECTION    . CORONARY ANGIOGRAM  11/19/2012   Procedure: CORONARY ANGIOGRAM;  Surgeon: Peter M Martinique, MD;  Location[facility] CATH LAB;  Service: Cardiovascular;;  . DILATION AND CURETTAGE OF UTERUS    . INTRAVASCULAR ULTRASOUND  11/19/2012   Procedure: INTRAVASCULAR ULTRASOUND;  Surgeon: Peter M Martinique, MD;  Location: [facility] CATH LAB;  Service: Cardiovascular;;  . LEFT HEART CATHETERIZATION WITH CORONARY ANGIOGRAM N/A 11/16/2012   Procedure: LEFT HEART CATHETERIZATION WITH CORONARY ANGIOGRAM;  Surgeon: Peter M Martinique, MD;  Location: [facility] CATH LAB;  Service: Cardiovascular;  Laterality: N/A;  . PERCUTANEOUS CORONARY INTERVENTION-BALLOON ONLY  11/19/2012   Procedure: PERCUTANEOUS CORONARY INTERVENTION-BALLOON ONLY;  Surgeon: Peter M Martinique, MD;  Location: [facility] CATH LAB;  Service: Cardiovascular;;    OB History    Gravida Para Term Preterm AB Living   7 3 2 1 4 4    SAB TAB Ectopic Multiple Live Births   4 0 0 1 2       Home Medications  Prior to Admission medications   Medication Sig Start Date End Date Taking? Authorizing Provider  aspirin 81 MG EC tablet Take 1 tablet (81 mg total) by mouth daily. 06/11/13  Yes Jerrye Noble, MD  iron polysaccharides (NIFEREX) 150 MG capsule Take 1 capsule (150 mg total) by mouth 2 (two) times daily. Patient taking differently: Take 150 mg by mouth daily.  09/22/15  Yes Jones Bales, MD  meloxicam (MOBIC) 7.5 MG tablet Take 1 tablet (7.5 mg total) by mouth daily.  01/09/17 01/09/18 Yes [name], DO  metoprolol tartrate (LOPRESSOR) 25 MG tablet Take 0.5 tablets (12.5 mg total) by mouth 2 (two) times daily. 11/21/14  Yes Imogene Burn, PA-C  atorvastatin (LIPITOR) 80 MG tablet Take 1 tablet (80 mg total) by mouth daily. Patient not taking: Reported on 03/20/2017 09/25/15   Imogene Burn, PA-C    Family History Family History  Problem Relation Age of Onset  . Heart disease Unknown     Social History Social History  Substance Use Topics  . Smoking status: Never Smoker  . Smokeless tobacco: Never Used  . Alcohol use No     Allergies   Patient has no known allergies.   Review of Systems Review of Systems  Constitutional: Negative for activity change, appetite change, chills, diaphoresis, fatigue and fever.  Respiratory: Positive for shortness of breath. Negative for cough and chest tightness.   Cardiovascular: Positive for chest pain. Negative for palpitations and leg swelling.  Gastrointestinal: Negative for abdominal distention, abdominal pain, blood in stool, constipation, diarrhea, nausea and vomiting.  Genitourinary: Negative for difficulty urinating.  Neurological: Negative for dizziness, weakness and light-headedness.  All other systems reviewed and are negative.    Physical Exam Updated Vital Signs BP 100/75   Pulse 78   Temp 97.9 F (36.6 C) (Oral)   Resp 20   LMP 02/27/2017 (Approximate)   SpO2 97%   Physical Exam  Constitutional: She is oriented to person, place, and time. She appears well-developed and well-nourished. No distress.  Cardiovascular: Normal rate, regular rhythm, normal heart sounds and intact distal pulses.  Exam reveals no gallop and no friction rub.   No murmur heard. Pain is not reproducible on exam   Pulmonary/Chest: Effort normal and breath sounds normal. No respiratory distress. She has no wheezes. She has no rales. She exhibits no tenderness.  Abdominal: Soft. Bowel sounds are normal. She  exhibits no distension. There is no tenderness. There is no rebound and no guarding.  Musculoskeletal: She exhibits no edema.  Neurological: She is alert and oriented to person, place, and time.  Skin: She is not diaphoretic.     ED Treatments / Results  Labs (all labs ordered are listed, but only abnormal results are displayed) Labs Reviewed  BASIC METABOLIC PANEL - Abnormal; Notable for the following:       Result Value   Sodium 133 (*)    Glucose, Bld 103 (*)    All other components within normal limits  CBC - Abnormal; Notable for the following:    WBC 12.2 (*)    Hemoglobin 11.6 (*)    MCV 75.3 (*)    MCH 23.9 (*)    RDW 16.2 (*)    Platelets 457 (*)    All other components within normal limits  I-STAT TROPONIN, ED  I-STAT TROPONIN, ED    EKG  EKG Interpretation  Date/Time:  Thursday March 20 2017 07:27:41 EDT Ventricular Rate:  88 PR Interval:  148  QRS Duration: 72 QT Interval:  362 QTC Calculation: 438 R Axis:   12 Text Interpretation:  Normal sinus rhythm Artifact Otherwise within normal limits Confirmed by Carmin Muskrat [phone]) on 03/20/2017 9:03:30 AM       Radiology Dg Chest 2 View  Result Date: 03/20/2017 CLINICAL DATA:  Chest pain and shortness of Breath EXAM: CHEST  2 VIEW COMPARISON:  02/15/2016 FINDINGS: The heart size and mediastinal contours are within normal limits. Both lungs are clear. The visualized skeletal structures are unremarkable. IMPRESSION: No active cardiopulmonary disease. Electronically Signed   By: Inez Catalina M.D.   On: 03/20/2017 07:48    Procedures Procedures (including critical care time)  Medications Ordered in ED Medications  nitroGLYCERIN (NITROSTAT) SL tablet 0.4 mg (0.4 mg Sublingual Given 03/20/17 1012)  morphine 4 MG/ML injection 1 mg (1 mg Intravenous Not Given 03/20/17 1015)     Initial Impression / Assessment and Plan / ED Course  I have reviewed the triage vital signs and the nursing notes.  Pertinent  labs & imaging results that were available during my care of the patient were reviewed by me and considered in my medical decision making (see chart for details).  Clinical Course as of Mar 21 1127  Thu Mar 20, 2017  1245 Comment 3:        [IS]    Clinical Course User Index [IS] Welford Roche, MD   Abigail Garcia is a 46 y.o. female with history of CAD s/p tx with heparin, aspiration thrombectomy and PTCA, NSTEMI 2014, HLD, IDA, prediabetes, RA who presents to the ED with chest pain concerning for ACS given patient's prior history of NSTEMI. Her EKG does not show signs of acute ischemia and first troponin negative. There are signs or symptoms concerning for infectious etiology at this time, her lung exam is normal, VSS, and CXR negative for acute processes therefore low suspicion for pneumonia, PE, or PTX. She denies association of chest pain with food intake but reports pain started in her LUQ, does have a history of grade 1 esophagitis and does not appear to be on a PPI. GERD remains in the differential. IV morphine 1 g x1, SL nitro and reassess. Will order second troponin as well.   1045 Patient reports chest pain resolved after SL nitro x1. She is now reporting sensation of fast heart beat. HR 80 and normall cardiac exam. She is resting comfortably in bed. Second troponin ordered.   1120 I stat troponin negative x 2. She remains hemodynamically stable and chest pain-free. Advised patient to follow-up with her cardiologist as she has not been seen since 2017. Return precautions discussed. Patient voiced understanding and is in agreement with plan.  Final Clinical Impressions(s) / ED Diagnoses   Final diagnoses:  Chest pain, unspecified type    New Prescriptions New Prescriptions   No medications on file     Welford Roche, MD 03/20/17 1128    Carmin Muskrat, MD 03/22/17 2035

## 2017-03-20 NOTE — ED Triage Notes (Signed)
Pt reports L sided CP, nonradiating, present on waking this am, reports hx MI.  Pt reports pain has improved, states, "It still feels not right." Resp e/u, NAD noted at this time.

## 2017-03-20 NOTE — ED Notes (Signed)
Patient verbalizes understanding of discharge instructions. Opportunity for questioning and answers were provided. 

## 2017-03-26 ENCOUNTER — Ambulatory Visit: Payer: Self-pay | Admitting: Obstetrics & Gynecology

## 2017-03-26 ENCOUNTER — Other Ambulatory Visit (HOSPITAL_COMMUNITY)
Admission: RE | Admit: 2017-03-26 | Discharge: 2017-03-26 | Disposition: A | Discharge status: Home / Self Care | Payer: Medicaid Other | Source: Ambulatory Visit | Attending: Obstetrics & Gynecology | Admitting: Obstetrics & Gynecology

## 2017-03-26 ENCOUNTER — Ambulatory Visit (INDEPENDENT_AMBULATORY_CARE_PROVIDER_SITE_OTHER): Payer: Medicaid Other | Admitting: General Practice

## 2017-03-26 DIAGNOSIS — B9689 Other specified bacterial agents as the cause of diseases classified elsewhere: Secondary | ICD-10-CM | POA: Insufficient documentation

## 2017-03-26 DIAGNOSIS — Z113 Encounter for screening for infections with a predominantly sexual mode of transmission: Secondary | ICD-10-CM

## 2017-03-26 DIAGNOSIS — N898 Other specified noninflammatory disorders of vagina: Secondary | ICD-10-CM | POA: Insufficient documentation

## 2017-03-26 DIAGNOSIS — A5909 Other urogenital trichomoniasis: Secondary | ICD-10-CM | POA: Diagnosis not present

## 2017-03-26 DIAGNOSIS — N760 Acute vaginitis: Secondary | ICD-10-CM | POA: Diagnosis not present

## 2017-03-26 NOTE — Progress Notes (Signed)
Patient here today for self swab. Patient states last month after period she had a horrible odor which continued for weeks. Patient started her period a few days and states something has just felt off and wishes to be tested. Patient instructed in self swab and told her we will contact her with abnormal results. Patient verbalized understanding

## 2017-03-27 ENCOUNTER — Other Ambulatory Visit: Payer: Self-pay | Admitting: Obstetrics & Gynecology

## 2017-03-27 ENCOUNTER — Encounter: Payer: Self-pay | Admitting: Obstetrics & Gynecology

## 2017-03-27 LAB — CERVICOVAGINAL ANCILLARY ONLY
Bacterial vaginitis: POSITIVE — AB
CHLAMYDIA, DNA PROBE: NEGATIVE
Candida vaginitis: NEGATIVE
Neisseria Gonorrhea: NEGATIVE
Trichomonas: POSITIVE — AB

## 2017-03-27 MED ORDER — METRONIDAZOLE 500 MG PO TABS: ORAL_TABLET | ORAL | 0 refills | Status: DC

## 2017-03-27 NOTE — Addendum Note (Signed)
Addended by: Woodroe Mode on: 03/27/2017 09:58 PM   Modules accepted: Orders

## 2017-04-17 ENCOUNTER — Ambulatory Visit: Payer: Medicaid Other | Admitting: Obstetrics & Gynecology

## 2017-04-17 ENCOUNTER — Other Ambulatory Visit (HOSPITAL_COMMUNITY)
Admission: RE | Admit: 2017-04-17 | Discharge: 2017-04-17 | Disposition: A | Discharge status: Home / Self Care | Payer: Medicaid Other | Source: Ambulatory Visit | Attending: Obstetrics & Gynecology | Admitting: Obstetrics & Gynecology

## 2017-04-17 ENCOUNTER — Encounter: Payer: Self-pay | Admitting: Obstetrics & Gynecology

## 2017-04-17 VITALS — BP 113/72 | HR 80 | Ht 63.0 in | Wt 259.9 lb

## 2017-04-17 DIAGNOSIS — Z01419 Encounter for gynecological examination (general) (routine) without abnormal findings: Secondary | ICD-10-CM | POA: Diagnosis not present

## 2017-04-17 DIAGNOSIS — Z0000 Encounter for general adult medical examination without abnormal findings: Secondary | ICD-10-CM | POA: Diagnosis not present

## 2017-04-17 DIAGNOSIS — L680 Hirsutism: Secondary | ICD-10-CM

## 2017-04-17 NOTE — Patient Instructions (Signed)
Mammogram A mammogram is an X-ray of the breasts that is done to check for changes that are not normal. This test can screen for and find any changes that may suggest breast cancer. This test can also help to find other changes and variations in the breast. What happens before the procedure?  Have this test done about 1-2 weeks after your period. This is usually when your breasts are the least tender.  If you are visiting a new doctor or clinic, send any past mammogram images to your new doctor's office.  Wash your breasts and under your arms the day of the test.  Do not use deodorants, perfumes, lotions, or powders on the day of the test.  Take off any jewelry from your neck.  Wear clothes that you can change into and out of easily. What happens during the procedure?  You will undress from the waist up. You will put on a gown.  You will stand in front of the X-ray machine.  Each breast will be placed between two plastic or glass plates. The plates will press down on your breast for a few seconds. Try to stay as relaxed as possible. This does not cause any harm to your breasts. Any discomfort you feel will be very brief.  X-rays will be taken from different angles of each breast. The procedure may vary among doctors and hospitals. What happens after the procedure?  The mammogram will be looked at by a specialist (radiologist).  You may need to do certain parts of the test again. This depends on the quality of the images.  Ask when your test results will be ready. Make sure you get your test results.  You may go back to your normal activities. This information is not intended to replace advice given to you by your health care provider. Make sure you discuss any questions you have with your health care provider. Document Released: 08/16/2008 Document Revised: 10/26/2015 Document Reviewed: 07/29/2014 Elsevier Interactive Patient Education  2018 Elsevier Inc.  

## 2017-04-17 NOTE — Progress Notes (Signed)
Patient ID: Abigail Garcia, female   DOB: 03/22/1971, 46 y.o.   MRN: [id number]  Chief Complaint  Patient presents with  . Gynecologic Exam    HPI Abigail Garcia is a 46 y.o. female.  [id number] Patient's last menstrual period was 03/25/2017 (approximate). Regular menses which may have flow using 3-4 pads a day at heaviest. She requests routine Gyn annual exam. Her pap is up to date and was normal 2017.  HPI  Past Medical History:  Diagnosis Date  . Anemia   . CAD (coronary artery disease)    a. 11/2012 NSTEMI/Cath/PCI: LM nl, LAD 80-90 thrombotic (tx with Heparin x 2 days then aspiration thrombectomy and PTCA), RI nl, LCX sm/nl, RCA nl, EF 55-65%.  . Esophagitis    grade 1  . Hx of echocardiogram    a. Echo (6/14) with EF 55-60%.   . Menorrhagia   . NSTEMI (non-ST elevated myocardial infarction) ([place])    11/2012   . Obesity   . RA (rheumatoid arthritis) ([place])     Past Surgical History:  Procedure Laterality Date  . bilateral tubal    . CARDIAC CATHETERIZATION  2014  . CESAREAN SECTION    . CORONARY ANGIOGRAM  11/19/2012   Procedure: CORONARY ANGIOGRAM;  Surgeon: Peter M Martinique, MD;  Location: [facility] CATH LAB;  Service: Cardiovascular;;  . DILATION AND CURETTAGE OF UTERUS    . INTRAVASCULAR ULTRASOUND  11/19/2012   Procedure: INTRAVASCULAR ULTRASOUND;  Surgeon: Peter M Martinique, MD;  Location: [facility] CATH LAB;  Service: Cardiovascular;;  . LEFT HEART CATHETERIZATION WITH CORONARY ANGIOGRAM N/A 11/16/2012   Procedure: LEFT HEART CATHETERIZATION WITH CORONARY ANGIOGRAM;  Surgeon: Peter M Martinique, MD;  Location: [facility] CATH LAB;  Service: Cardiovascular;  Laterality: N/A;  . PERCUTANEOUS CORONARY INTERVENTION-BALLOON ONLY  11/19/2012   Procedure: PERCUTANEOUS CORONARY INTERVENTION-BALLOON ONLY;  Surgeon: Peter M Martinique, MD;  Location: [facility] CATH LAB;  Service: Cardiovascular;;    Family History  Problem Relation Age of Onset  . Heart disease Unknown     Social History Social History   Tobacco Use  .  Smoking status: Never Smoker  . Smokeless tobacco: Never Used  Substance Use Topics  . Alcohol use: No  . Drug use: No    No Known Allergies  Current Outpatient Medications  Medication Sig Dispense Refill  . aspirin 81 MG EC tablet Take 1 tablet (81 mg total) by mouth daily. 30 tablet 11  . iron polysaccharides (NIFEREX) 150 MG capsule Take 1 capsule (150 mg total) by mouth 2 (two) times daily. 60 capsule 3  . meloxicam (MOBIC) 7.5 MG tablet Take 1 tablet (7.5 mg total) by mouth daily. 30 tablet 2  . metoprolol tartrate (LOPRESSOR) 25 MG tablet Take 0.5 tablets (12.5 mg total) by mouth 2 (two) times daily. 60 tablet 6  . atorvastatin (LIPITOR) 80 MG tablet Take 1 tablet (80 mg total) by mouth daily. (Patient not taking: Reported on 03/20/2017) 30 tablet 6  . metroNIDAZOLE (FLAGYL) 500 MG tablet Take two tablets by mouth twice a day, for one day.  Or you can take all four tablets at once if you can tolerate it. (Patient not taking: Reported on 04/17/2017) 4 tablet 0   No current facility-administered medications for this visit.     Review of Systems Review of Systems  Constitutional: Negative.   Respiratory: Negative.   Gastrointestinal: Negative.   Endocrine: Negative for cold intolerance and heat intolerance.       Increased facial hair  Genitourinary: Negative.  Blood pressure 113/72, pulse 80, height 5\' 3"  (1.6 m), weight 259 lb 14.4 oz (117.9 kg), last menstrual period 03/25/2017.  Physical Exam Physical Exam  Constitutional: She is oriented to person, place, and time. She appears well-developed. No distress.  HENT:  Head: Normocephalic and atraumatic.  Acne and coarse facial hair   Eyes: Pupils are equal, round, and reactive to light.  Neck: Normal range of motion. No thyromegaly present.  Cardiovascular: Normal rate and normal heart sounds.  Pulmonary/Chest: Effort normal and breath sounds normal. No respiratory distress.  Abdominal: Soft. She exhibits no  distension and no mass. There is no tenderness.  Genitourinary: Vagina normal and uterus normal. No vaginal discharge found.  Genitourinary Comments: No mass  Musculoskeletal: Normal range of motion.  Neurological: She is alert and oriented to person, place, and time.  Skin: Skin is warm and dry.  Psychiatric: She has a normal mood and affect. Her behavior is normal.  Vitals reviewed. Breasts: breasts appear normal, no suspicious masses, no skin or nipple changes or axillary nodes.   Data Reviewed Mammogram 2017 normal  Assessment    Well woman exam Obesity, hirsutum requests hormone test today     Plan    TSH, FSH, testosterone Mammogram screening Yearly exam Report if menstrual sx worsen        Emeterio Reeve 04/17/2017, 9:32 AM

## 2017-04-18 LAB — CERVICOVAGINAL ANCILLARY ONLY
Bacterial vaginitis: POSITIVE — AB
CHLAMYDIA, DNA PROBE: NEGATIVE
Candida vaginitis: NEGATIVE
Neisseria Gonorrhea: NEGATIVE
TRICH (WINDOWPATH): NEGATIVE

## 2017-04-20 LAB — TESTT+TESTF+SHBG
SEX HORMONE BINDING: 58.1 nmol/L (ref 24.6–122.0)
TESTOSTERONE FREE: 0.9 pg/mL (ref 0.0–4.2)
Testosterone, total: 13.1 ng/dL

## 2017-04-20 LAB — TSH: TSH: 1.93 u[IU]/mL (ref 0.450–4.500)

## 2017-04-20 LAB — FOLLICLE STIMULATING HORMONE: FSH: 12.2 m[IU]/mL

## 2017-04-21 ENCOUNTER — Other Ambulatory Visit: Payer: Self-pay | Admitting: Obstetrics & Gynecology

## 2017-04-21 DIAGNOSIS — N924 Excessive bleeding in the premenopausal period: Secondary | ICD-10-CM

## 2017-04-21 MED ORDER — METRONIDAZOLE 500 MG PO TABS: 500.0000 mg | ORAL_TABLET | Freq: Two times a day (BID) | ORAL | 0 refills | Status: AC

## 2017-05-05 ENCOUNTER — Ambulatory Visit: Payer: Medicaid Other | Admitting: Physician Assistant

## 2017-05-05 ENCOUNTER — Encounter: Payer: Self-pay | Admitting: Physician Assistant

## 2017-05-05 VITALS — BP 110/78 | HR 61 | Ht 63.0 in | Wt 264.8 lb

## 2017-05-05 DIAGNOSIS — I2510 Atherosclerotic heart disease of native coronary artery without angina pectoris: Secondary | ICD-10-CM

## 2017-05-05 DIAGNOSIS — Z6841 Body Mass Index (BMI) 40.0 and over, adult: Secondary | ICD-10-CM

## 2017-05-05 DIAGNOSIS — E785 Hyperlipidemia, unspecified: Secondary | ICD-10-CM | POA: Diagnosis not present

## 2017-05-05 DIAGNOSIS — Z9861 Coronary angioplasty status: Secondary | ICD-10-CM | POA: Diagnosis not present

## 2017-05-05 MED ORDER — ROSUVASTATIN CALCIUM 20 MG PO TABS: 20.0000 mg | ORAL_TABLET | Freq: Every day | ORAL | 3 refills | Status: DC

## 2017-05-05 MED ORDER — METOPROLOL TARTRATE 25 MG PO TABS: 12.5000 mg | ORAL_TABLET | Freq: Two times a day (BID) | ORAL | 3 refills | Status: DC

## 2017-05-05 NOTE — Patient Instructions (Signed)
Medication Instructions:  Your physician has recommended you make the following change in your medication: 1.  STOP the Atorvastatin 2.  START Crestor 20 mg daily   Labwork: TODAY:  LIPID & LFT  Testing/Procedures: None ordered  Follow-Up: Your physician wants you to follow-up in: [place] will receive a reminder letter in the mail two months in advance. If you don't receive a letter, please call our office to schedule the follow-up appointment.   Any Other Special Instructions Will Be Listed Below (If Applicable).     If you need a refill on your cardiac medications before your next appointment, please call your pharmacy.

## 2017-05-05 NOTE — Progress Notes (Signed)
Cardiology Office Note    Date:  05/05/2017   ID:  Abigail Garcia, DOB [birth year], MRN [id number]  PCP:  [name], DO  Cardiologist: Dr. Aundra Dubin to be Dr. Saunders Revel  Chief Complaint  Patient presents with  . Follow-up    History of Present Illness:  Abigail Garcia is a 46 y.o. female with history of CAD status post NSTEMI 11/2012 with thrombus noted in the mid LAD.  She underwent aspiration thrombectomy and PTCA.  She was treated with Plavix until it was stopped in 01/2014.  GXT 08/2013 for atypical chest pain showed poor exercise tolerance but no ischemic EKG changes.  Patient also has hyperlipidemia and obesity.  I last saw her 09/2015 at which time she had stopped taking her Lipitor.  I resumed her Lipitor and encouraged a weight loss program.  Patient was in the ER with chest pain 03/20/17 with chest pain that was felt to be noncardiac and no EKG changes.  Troponins were negative.   Patient comes in for yearly f/u. Says she was under a lot of stress at that time of her ER visit.  She has had no further chest pain.  She says she vomits every time she tries to take the Lipitor.  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  She has difficult time with diet and exercise.  She was referred to weight loss management clinic at [facility] but had to cancel.  Has gained 15 pounds in the past year.  No regular exercise.     Past Medical History:  Diagnosis Date  . Anemia   . CAD (coronary artery disease)    a. 11/2012 NSTEMI/Cath/PCI: LM nl, LAD 80-90 thrombotic (tx with Heparin x 2 days then aspiration thrombectomy and PTCA), RI nl, LCX sm/nl, RCA nl, EF 55-65%.  . Esophagitis    grade 1  . Hx of echocardiogram    a. Echo (6/14) with EF 55-60%.   . Menorrhagia   . NSTEMI (non-ST elevated myocardial infarction) ([place])    11/2012   . Obesity   . RA (rheumatoid arthritis) ([place])     Past Surgical History:  Procedure Laterality Date  . bilateral tubal    . CARDIAC CATHETERIZATION  2014  .  CESAREAN SECTION    . CORONARY ANGIOGRAM  11/19/2012   Procedure: CORONARY ANGIOGRAM;  Surgeon: Peter M Martinique, MD;  Location[facility] CATH LAB;  Service: Cardiovascular;;  . DILATION AND CURETTAGE OF UTERUS    . INTRAVASCULAR ULTRASOUND  11/19/2012   Procedure: INTRAVASCULAR ULTRASOUND;  Surgeon: Peter M Martinique, MD;  Location: [facility] CATH LAB;  Service: Cardiovascular;;  . LEFT HEART CATHETERIZATION WITH CORONARY ANGIOGRAM N/A 11/16/2012   Procedure: LEFT HEART CATHETERIZATION WITH CORONARY ANGIOGRAM;  Surgeon: Peter M Martinique, MD;  Location: [facility] CATH LAB;  Service: Cardiovascular;  Laterality: N/A;  . PERCUTANEOUS CORONARY INTERVENTION-BALLOON ONLY  11/19/2012   Procedure: PERCUTANEOUS CORONARY INTERVENTION-BALLOON ONLY;  Surgeon: Peter M Martinique, MD;  Location: [facility] CATH LAB;  Service: Cardiovascular;;    Current Medications: Current Meds  Medication Sig  . aspirin 81 MG EC tablet Take 1 tablet (81 mg total) by mouth daily.  . iron polysaccharides (NIFEREX) 150 MG capsule Take 1 capsule (150 mg total) by mouth 2 (two) times daily.  . meloxicam (MOBIC) 7.5 MG tablet Take 1 tablet (7.5 mg total) by mouth daily.  . metoprolol tartrate (LOPRESSOR) 25 MG tablet Take 0.5 tablets (12.5 mg total) by mouth 2 (two) times daily.  . metroNIDAZOLE (  FLAGYL) 500 MG tablet Take two tablets by mouth twice a day, for one day.  Or you can take all four tablets at once if you can tolerate it.  . [DISCONTINUED] metoprolol tartrate (LOPRESSOR) 25 MG tablet Take 0.5 tablets (12.5 mg total) by mouth 2 (two) times daily.     Allergies:   Patient has no known allergies.   Social History   Socioeconomic History  . Marital status: Single    Spouse name: None  . Number of children: None  . Years of education: None  . Highest education level: None  Social Needs  . Financial resource strain: None  . Food insecurity - worry: None  . Food insecurity - inability: None  . Transportation needs - medical: None  . Transportation  needs - non-medical: None  Occupational History  . None  Tobacco Use  . Smoking status: Never Smoker  . Smokeless tobacco: Never Used  Substance and Sexual Activity  . Alcohol use: No  . Drug use: No  . Sexual activity: Not Currently    Birth control/protection: Surgical  Other Topics Concern  . None  Social History Narrative  . None     Family History:  The patient's family history includes Heart disease in her unknown relative.   ROS:   Please see the history of present illness.    Review of Systems  Constitution: Negative.  HENT: Negative.   Eyes: Negative.   Cardiovascular: Negative.   Respiratory: Negative.   Hematologic/Lymphatic: Negative.   Musculoskeletal: Positive for arthritis. Negative for joint pain.  Gastrointestinal: Negative.   Genitourinary: Negative.   Neurological: Negative.    All other systems reviewed and are negative.   PHYSICAL EXAM:   VS:  BP 110/78   Pulse 61   Ht 5\' 3"  (1.6 m)   Wt 264 lb 12.8 oz (120.1 kg)   SpO2 98%   BMI 46.91 kg/m   Physical Exam  GEN: Obese, in no acute distress  Neck: no JVD, carotid bruits, or masses Cardiac:RRR; 2/6 systolic murmur at the left sternal border Respiratory:  clear to auscultation bilaterally, normal work of breathing GI: soft, nontender, nondistended, + BS Ext: without cyanosis, clubbing, or edema, Good distal pulses bilaterally Neuro:  Alert and Oriented x 3 Psych: euthymic mood, full affect  Wt Readings from Last 3 Encounters:  05/05/17 264 lb 12.8 oz (120.1 kg)  04/17/17 259 lb 14.4 oz (117.9 kg)  01/09/17 264 lb 6.4 oz (119.9 kg)      Studies/Labs Reviewed:   EKG:  EKG is not ordered today.  EKG reviewed from the ER as described above Recent Labs: 03/20/2017: BUN 13; Creatinine, Ser 0.75; Hemoglobin 11.6; Platelets 457; Potassium 3.5; Sodium 133 04/17/2017: TSH 1.930   Lipid Panel    Component Value Date/Time   CHOL 208 (H) 09/22/2015 1130   TRIG 80 09/22/2015 1130   HDL 45  09/22/2015 1130   CHOLHDL 4.6 (H) 09/22/2015 1130   CHOLHDL 4 11/10/2013 1411   VLDL 20.2 11/10/2013 1411   LDLCALC 147 (H) 09/22/2015 1130    Additional studies/ records that were reviewed today include:  Cardiac cath 2014 Coronary angiography: Coronary dominance: right   Left mainstem: Normal   Left anterior descending (LAD): There is a thrombus in the mid LAD following the takeoff of the first diagonal with eccentric 80-90% stenosis. The LAD is a large vessel and otherwise appears normal.    There is a very large Ramus intermediate branch that appears normal.  Left circumflex (LCx): Relatively small and normal.   Right coronary artery (RCA): Anterior takeoff, dominant vessel. Normal.   Left ventriculography: Left ventricular systolic function is normal, LVEF is estimated at 55-65%, there is no significant mitral regurgitation    Final Conclusions:   1. 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  2. Normal LV function.   Recommendations: Recommend aggressive antithrombotic and antiplatelet therapy for 48-72 hours. Check for coagulopathy. Recommend repeat angiography on Thursday 11/19/12   Collier Salina [facility] 11/16/2012, 2:18 PM  Lesion Data: Vessel: LAD Percent stenosis (pre): 80% TIMI-flow (pre):  3 Aspiration thrombectomy and balloon angioplasty Percent stenosis (post): 0% TIMI-flow (post): 3   Conclusions:  Successful PCI of the mid LAD for a thrombotic lesion. This was successfully treated with aspiration thrombectomy and balloon angioplasty.   Recommendations: We'll continue IV Integrilin for 6 hours. Continue dual antiplatelet therapy for one year. Aggressive risk factor modification.   Collier Salina [facility] 11/19/2012, 9:04 AM     ASSESSMENT:    1. CAD S/P percutaneous coronary angioplasty   2. Hyperlipidemia, unspecified hyperlipidemia type   3. Class 3 severe obesity due to excess calories with serious comorbidity  and body mass index (BMI) of 45.0 to 49.9 in adult [facility])      PLAN:  In order of problems listed above:  CAD S/P PCIstatus post NSTEMI 11/2012 with thrombus noted in the mid LAD.  She underwent aspiration thrombectomy and PTCA.  Recent ER visit with chest pain when she was under a lot of stress.  EKG without change and troponins negative.  No further chest pain.  She is to call if she has any further symptoms.  We will have her follow-up with Dr. Saunders Revel in 1 year  Hyperlipidemia intolerant to Lipitor.  Check fasting lipid panel today try Crestor 20 mg once daily.  Obesity with continued weight gain.  Recommend weight loss clinic at [facility] and have given her the number to make an appointment.  Also recommend 150 minutes of exercise per week    Medication Adjustments/Labs and Tests Ordered: Current medicines are reviewed at length with the patient today.  Concerns regarding medicines are outlined above.  Medication changes, Labs and Tests ordered today are listed in the Patient Instructions below. There are no Patient Instructions on file for this visit.   Signed, Ermalinda Barrios, PA-C  05/05/2017 [facility] [place], [place], [place]  [address] Phone: [phone]; Fax: [phone]

## 2017-05-06 LAB — HEPATIC FUNCTION PANEL
ALK PHOS: 70 IU/L (ref 39–117)
ALT: 8 IU/L (ref 0–32)
AST: 10 IU/L (ref 0–40)
Albumin: 3.7 g/dL (ref 3.5–5.5)
Bilirubin Total: 0.2 mg/dL (ref 0.0–1.2)
Bilirubin, Direct: 0.07 mg/dL (ref 0.00–0.40)
TOTAL PROTEIN: 7.1 g/dL (ref 6.0–8.5)

## 2017-05-06 LAB — LIPID PANEL
CHOL/HDL RATIO: 5.3 ratio — AB (ref 0.0–4.4)
Cholesterol, Total: 231 mg/dL — ABNORMAL HIGH (ref 100–199)
HDL: 44 mg/dL (ref >39)
LDL Calculated: 155 mg/dL — ABNORMAL HIGH (ref 0–99)
TRIGLYCERIDES: 159 mg/dL — AB (ref 0–149)
VLDL CHOLESTEROL CAL: 32 mg/dL (ref 5–40)

## 2017-05-07 ENCOUNTER — Telehealth: Payer: Self-pay

## 2017-05-07 DIAGNOSIS — E785 Hyperlipidemia, unspecified: Secondary | ICD-10-CM

## 2017-05-07 NOTE — Telephone Encounter (Signed)
Call to patient with lab results.  She has not yet started Crestor, but plans to get prescription filled today.  Lab appt scheduled for June 18, 2016.

## 2017-06-18 ENCOUNTER — Other Ambulatory Visit: Payer: Medicaid Other | Admitting: *Deleted

## 2017-06-18 DIAGNOSIS — E785 Hyperlipidemia, unspecified: Secondary | ICD-10-CM

## 2017-06-18 LAB — HEPATIC FUNCTION PANEL
ALT: 10 IU/L (ref 0–32)
AST: 10 IU/L (ref 0–40)
Albumin: 3.8 g/dL (ref 3.5–5.5)
Alkaline Phosphatase: 76 IU/L (ref 39–117)
BILIRUBIN TOTAL: 0.3 mg/dL (ref 0.0–1.2)
BILIRUBIN, DIRECT: 0.08 mg/dL (ref 0.00–0.40)
Total Protein: 7.2 g/dL (ref 6.0–8.5)

## 2017-06-18 LAB — LIPID PANEL
CHOL/HDL RATIO: 3.7 ratio (ref 0.0–4.4)
CHOLESTEROL TOTAL: 162 mg/dL (ref 100–199)
HDL: 44 mg/dL (ref >39)
LDL Calculated: 97 mg/dL (ref 0–99)
TRIGLYCERIDES: 106 mg/dL (ref 0–149)
VLDL Cholesterol Cal: 21 mg/dL (ref 5–40)

## 2017-06-19 ENCOUNTER — Telehealth: Payer: Self-pay | Admitting: *Deleted

## 2017-06-19 DIAGNOSIS — Z79899 Other long term (current) drug therapy: Secondary | ICD-10-CM

## 2017-06-19 NOTE — Telephone Encounter (Signed)
-----   Message from Imogene Burn, PA-C sent at 06/19/2017  7:30 AM EST ----- Lipid profile much better. LDL still not at goal which is under 70. Repeat labs in 6 weeks and if LDL still up may need to increase Crestor

## 2017-06-30 ENCOUNTER — Telehealth: Payer: Self-pay | Admitting: General Practice

## 2017-06-30 DIAGNOSIS — N898 Other specified noninflammatory disorders of vagina: Secondary | ICD-10-CM

## 2017-06-30 NOTE — Telephone Encounter (Signed)
Patient called and left message on nurse line stating she is calling about her flagyl prescription. Patient states mychart says her test is negative and she doesn't know if she needs the flagyl or not. Called patient, no answer- left message stating we are trying to reach you to return your phone call, please call us back

## 2017-07-01 MED ORDER — METRONIDAZOLE 500 MG PO TABS: 500.0000 mg | ORAL_TABLET | Freq: Two times a day (BID) | ORAL | 0 refills | Status: DC

## 2017-07-01 NOTE — Telephone Encounter (Signed)
Patient called and left message on nurse line stating she is returning our call. Called patient and she states she never got the flagyl Rx from her visit in October. Patient states she wasn't really having symptoms then but she is now. Flagyl Rx sent in per protocol. Notified patient. Patient verbalized understanding to all & had no questions

## 2017-07-08 ENCOUNTER — Other Ambulatory Visit: Payer: Self-pay | Admitting: Internal Medicine

## 2017-07-08 NOTE — Telephone Encounter (Signed)
NEEDS REFILL ON MELOXICAM 7.5 MG, TO [facility] [phone]

## 2017-07-09 MED ORDER — MELOXICAM 7.5 MG PO TABS: 7.5000 mg | ORAL_TABLET | Freq: Every day | ORAL | 1 refills | Status: AC

## 2017-07-30 ENCOUNTER — Other Ambulatory Visit: Payer: Medicaid Other

## 2017-11-29 ENCOUNTER — Encounter: Payer: Self-pay | Admitting: *Deleted

## 2018-11-21 ENCOUNTER — Encounter: Payer: Self-pay | Admitting: *Deleted

## 2019-03-11 ENCOUNTER — Other Ambulatory Visit: Payer: Self-pay

## 2019-03-11 ENCOUNTER — Inpatient Hospital Stay (HOSPITAL_COMMUNITY)
Admission: EM | Admit: 2019-03-11 | Discharge: 2019-03-14 | DRG: 744 | Disposition: A | Discharge status: Home / Self Care | Payer: No Typology Code available for payment source | Attending: Internal Medicine | Admitting: Internal Medicine

## 2019-03-11 ENCOUNTER — Encounter (HOSPITAL_COMMUNITY): Payer: Self-pay | Admitting: Emergency Medicine

## 2019-03-11 ENCOUNTER — Emergency Department (HOSPITAL_COMMUNITY): Payer: No Typology Code available for payment source

## 2019-03-11 DIAGNOSIS — N921 Excessive and frequent menstruation with irregular cycle: Secondary | ICD-10-CM

## 2019-03-11 DIAGNOSIS — I2510 Atherosclerotic heart disease of native coronary artery without angina pectoris: Secondary | ICD-10-CM | POA: Diagnosis present

## 2019-03-11 DIAGNOSIS — Z7982 Long term (current) use of aspirin: Secondary | ICD-10-CM

## 2019-03-11 DIAGNOSIS — Y92009 Unspecified place in unspecified non-institutional (private) residence as the place of occurrence of the external cause: Secondary | ICD-10-CM

## 2019-03-11 DIAGNOSIS — D219 Benign neoplasm of connective and other soft tissue, unspecified: Secondary | ICD-10-CM | POA: Diagnosis not present

## 2019-03-11 DIAGNOSIS — Z79899 Other long term (current) drug therapy: Secondary | ICD-10-CM

## 2019-03-11 DIAGNOSIS — Z9114 Patient's other noncompliance with medication regimen: Secondary | ICD-10-CM

## 2019-03-11 DIAGNOSIS — T466X6A Underdosing of antihyperlipidemic and antiarteriosclerotic drugs, initial encounter: Secondary | ICD-10-CM | POA: Diagnosis present

## 2019-03-11 DIAGNOSIS — R7989 Other specified abnormal findings of blood chemistry: Secondary | ICD-10-CM | POA: Diagnosis present

## 2019-03-11 DIAGNOSIS — M069 Rheumatoid arthritis, unspecified: Secondary | ICD-10-CM | POA: Diagnosis present

## 2019-03-11 DIAGNOSIS — D259 Leiomyoma of uterus, unspecified: Secondary | ICD-10-CM | POA: Diagnosis not present

## 2019-03-11 DIAGNOSIS — M720 Palmar fascial fibromatosis [Dupuytren]: Secondary | ICD-10-CM | POA: Diagnosis present

## 2019-03-11 DIAGNOSIS — D649 Anemia, unspecified: Secondary | ICD-10-CM

## 2019-03-11 DIAGNOSIS — Z9851 Tubal ligation status: Secondary | ICD-10-CM

## 2019-03-11 DIAGNOSIS — Z20828 Contact with and (suspected) exposure to other viral communicable diseases: Secondary | ICD-10-CM | POA: Diagnosis present

## 2019-03-11 DIAGNOSIS — I252 Old myocardial infarction: Secondary | ICD-10-CM

## 2019-03-11 DIAGNOSIS — D62 Acute posthemorrhagic anemia: Secondary | ICD-10-CM | POA: Diagnosis present

## 2019-03-11 DIAGNOSIS — Z6841 Body Mass Index (BMI) 40.0 and over, adult: Secondary | ICD-10-CM

## 2019-03-11 DIAGNOSIS — Z9861 Coronary angioplasty status: Secondary | ICD-10-CM

## 2019-03-11 DIAGNOSIS — Z8249 Family history of ischemic heart disease and other diseases of the circulatory system: Secondary | ICD-10-CM

## 2019-03-11 DIAGNOSIS — Z91128 Patient's intentional underdosing of medication regimen for other reason: Secondary | ICD-10-CM

## 2019-03-11 DIAGNOSIS — I10 Essential (primary) hypertension: Secondary | ICD-10-CM | POA: Diagnosis present

## 2019-03-11 LAB — BASIC METABOLIC PANEL
Anion gap: 8 (ref 5–15)
BUN: 7 mg/dL (ref 6–20)
CO2: 26 mmol/L (ref 22–32)
Calcium: 8.9 mg/dL (ref 8.9–10.3)
Chloride: 104 mmol/L (ref 98–111)
Creatinine, Ser: 0.77 mg/dL (ref 0.44–1.00)
GFR calc Af Amer: >60 mL/min (ref >60)
GFR calc non Af Amer: >60 mL/min (ref >60)
Glucose, Bld: 98 mg/dL (ref 70–99)
Potassium: 3.9 mmol/L (ref 3.5–5.1)
Sodium: 138 mmol/L (ref 135–145)

## 2019-03-11 LAB — I-STAT BETA HCG BLOOD, ED (MC, WL, AP ONLY): I-stat hCG, quantitative: <5 m[IU]/mL (ref <5)

## 2019-03-11 LAB — CBC
HCT: 21.1 % — ABNORMAL LOW (ref 36.0–46.0)
Hemoglobin: 5.8 g/dL — CL (ref 12.0–15.0)
MCH: 18.1 pg — ABNORMAL LOW (ref 26.0–34.0)
MCHC: 27.5 g/dL — ABNORMAL LOW (ref 30.0–36.0)
MCV: 65.9 fL — ABNORMAL LOW (ref 80.0–100.0)
Platelets: 700 10*3/uL — ABNORMAL HIGH (ref 150–400)
RBC: 3.2 MIL/uL — ABNORMAL LOW (ref 3.87–5.11)
RDW: 21.6 % — ABNORMAL HIGH (ref 11.5–15.5)
WBC: 11.1 10*3/uL — ABNORMAL HIGH (ref 4.0–10.5)
nRBC: 0.3 % — ABNORMAL HIGH (ref 0.0–0.2)

## 2019-03-11 LAB — PREPARE RBC (CROSSMATCH)

## 2019-03-11 LAB — PROTIME-INR
INR: 1 (ref 0.8–1.2)
Prothrombin Time: 13.5 seconds (ref 11.4–15.2)

## 2019-03-11 LAB — TROPONIN I (HIGH SENSITIVITY): Troponin I (High Sensitivity): <2 ng/L (ref <18)

## 2019-03-11 MED ORDER — ACETAMINOPHEN 500 MG PO TABS
500.0000 mg | ORAL_TABLET | Freq: Once | ORAL | Status: AC
  Administered 2019-03-11: 23:00:00 500 mg via ORAL
  Filled 2019-03-11: qty 1

## 2019-03-11 MED ORDER — SODIUM CHLORIDE 0.9% FLUSH
3.0000 mL | Freq: Once | INTRAVENOUS | Status: AC
  Administered 2019-03-11: 23:00:00 3 mL via INTRAVENOUS

## 2019-03-11 MED ORDER — SODIUM CHLORIDE 0.9% IV SOLUTION
Freq: Once | INTRAVENOUS | Status: AC
  Administered 2019-03-11: 23:00:00 10 mL/h via INTRAVENOUS

## 2019-03-11 NOTE — ED Triage Notes (Signed)
Pt c/o constant chest pain and headache since Monday. Also c/o shortness of breath and her period lasting 3 weeks. Hx MI, has not seen her cardiologist x 2 years.

## 2019-03-11 NOTE — ED Notes (Signed)
Electronic blood consent obtained at this time 

## 2019-03-11 NOTE — ED Notes (Signed)
EDP at bedside will return for assessment

## 2019-03-11 NOTE — ED Provider Notes (Addendum)
[facility] EMERGENCY DEPARTMENT Provider Note   CSN: [id number] Arrival date & time: 03/11/19  1610     History   Chief Complaint Chief Complaint  Patient presents with  . Chest Pain  . Headache    HPI Abigail Garcia is a 48 y.o. female with a past medical history significant for iron deficiency anemia, CAD, hx of MI, Menorrhagia, and RA who presents to the ED on 10/8 due to sudden onset of uncontrollable menstrual bleeding for the past 3 weeks. Patient admits to changing her maxi pad every hour and frequently passes clots. Heavy menstrual bleeding is associated with palpitations and headache that started Monday. Patient admits that her palpitations are intermittent in nature and randomly occur, not associated with exertion. Patient also admits to right sided headache that has waxed and waned since Monday. Patient has tried Naproxen and Tylenol with moderate relief. Patient denies blurry vision, nausea, and vomiting associated with the headache. Patient does not have a history of migraines or headaches. Patient does have a history of a previous blood transfusion in 2005 while she was pregnant with her living twins. She had a D&C in 2004 after a miscarriage. Patient has had a total of 4 miscarriages none of which required treatment except for the [facility] in 2004. Patient takes ASA 81mg  daily. Patient denies chest pain, shortness of breath, fever, and chills.  Past Medical History:  Diagnosis Date  . Anemia   . CAD (coronary artery disease)    a. 11/2012 NSTEMI/Cath/PCI: LM nl, LAD 80-90 thrombotic (tx with Heparin x 2 days then aspiration thrombectomy and PTCA), RI nl, LCX sm/nl, RCA nl, EF 55-65%.  . Esophagitis    grade 1  . Hx of echocardiogram    a. Echo (6/14) with EF 55-60%.   . Menorrhagia   . NSTEMI (non-ST elevated myocardial infarction) ([place])    11/2012   . Obesity   . RA (rheumatoid arthritis) [facility])     Patient Active Problem List   Diagnosis Date Noted  .  Symptomatic anemia 03/12/2019  . Hirsutism 04/17/2017  . Prediabetes 08/18/2015  . Blurry vision, bilateral 08/18/2015  . DUB (dysfunctional uterine bleeding) 08/11/2015  . Obesity 11/11/2013  . Leukocytosis, unspecified 08/31/2013  . Thrombocytosis ([place]) 08/31/2013  . Ganglion cyst of wrist 06/12/2013  . Health care maintenance 06/12/2013  . Menorrhagia 12/28/2012  . Hyperlipidemia 12/03/2012  . CAD S/P percutaneous coronary angioplasty 11/30/2012  . Rheumatoid arthritis ([place]) 11/14/2012  . Anemia, iron deficiency 11/14/2012    Past Surgical History:  Procedure Laterality Date  . bilateral tubal    . CARDIAC CATHETERIZATION  2014  . CESAREAN SECTION    . CORONARY ANGIOGRAM  11/19/2012   Procedure: CORONARY ANGIOGRAM;  Surgeon: Peter M Martinique, MD;  Location: [facility] CATH LAB;  Service: Cardiovascular;;  . DILATION AND CURETTAGE OF UTERUS    . INTRAVASCULAR ULTRASOUND  11/19/2012   Procedure: INTRAVASCULAR ULTRASOUND;  Surgeon: Peter M Martinique, MD;  Location: [facility] CATH LAB;  Service: Cardiovascular;;  . LEFT HEART CATHETERIZATION WITH CORONARY ANGIOGRAM N/A 11/16/2012   Procedure: LEFT HEART CATHETERIZATION WITH CORONARY ANGIOGRAM;  Surgeon: Peter M Martinique, MD;  Location: [facility] CATH LAB;  Service: Cardiovascular;  Laterality: N/A;  . PERCUTANEOUS CORONARY INTERVENTION-BALLOON ONLY  11/19/2012   Procedure: PERCUTANEOUS CORONARY INTERVENTION-BALLOON ONLY;  Surgeon: Peter M Martinique, MD;  Location: [facility] CATH LAB;  Service: Cardiovascular;;     OB History    Gravida  7   Para  3  Term  2   Preterm  1   AB  4   Living  4     SAB  4   TAB  0   Ectopic  0   Multiple  1   Live Births  2            Home Medications    Prior to Admission medications   Medication Sig Start Date End Date Taking? Authorizing Provider  aspirin 81 MG EC tablet Take 1 tablet (81 mg total) by mouth daily. 06/11/13   Jerrye Noble, MD  iron polysaccharides (NIFEREX) 150 MG capsule Take 1 capsule (150 mg  total) by mouth 2 (two) times daily. 09/22/15   Jones Bales, MD  metoprolol tartrate (LOPRESSOR) 25 MG tablet Take 0.5 tablets (12.5 mg total) by mouth 2 (two) times daily. 05/05/17   Imogene Burn, PA-C  metroNIDAZOLE (FLAGYL) 500 MG tablet Take 1 tablet (500 mg total) by mouth 2 (two) times daily. 07/01/17   Woodroe Mode, MD  rosuvastatin (CRESTOR) 20 MG tablet Take 1 tablet (20 mg total) by mouth daily. 05/05/17 08/03/17  Imogene Burn, PA-C    Family History Family History  Problem Relation Age of Onset  . Heart disease Other     Social History Social History   Tobacco Use  . Smoking status: Never Smoker  . Smokeless tobacco: Never Used  Substance Use Topics  . Alcohol use: No  . Drug use: No     Allergies   Patient has no known allergies.   Review of Systems Review of Systems  Constitutional: Positive for appetite change. Negative for chills and fever.  Eyes: Negative for visual disturbance.  Respiratory: Negative for shortness of breath.   Cardiovascular: Positive for palpitations. Negative for chest pain and leg swelling.  Gastrointestinal: Negative for abdominal distention, abdominal pain, diarrhea, nausea and vomiting.  Genitourinary: Negative for dysuria.  Neurological: Positive for headaches. Negative for dizziness, syncope, weakness, light-headedness and numbness.  All other systems reviewed and are negative.    Physical Exam Updated Vital Signs BP 128/83   Pulse 85   Temp 98.3 F (36.8 C) (Oral)   Resp 13   LMP 02/25/2019   SpO2 100%   Physical Exam Vitals signs and nursing note reviewed.  Constitutional:      General: He is not in acute distress.    Appearance: He is not ill-appearing.  HENT:     Head: Normocephalic.  Eyes:     Pupils: Pupils are equal, round, and reactive to light.  Neck:     Musculoskeletal: Neck supple.  Cardiovascular:     Rate and Rhythm: Normal rate and regular rhythm.     Pulses: Normal pulses.     Heart  sounds: Normal heart sounds. No murmur. No friction rub. No gallop.   Pulmonary:     Effort: Pulmonary effort is normal. No respiratory distress.     Breath sounds: Normal breath sounds. No stridor. No wheezing.  Abdominal:     General: Abdomen is flat. Bowel sounds are normal. There is no distension.     Palpations: Abdomen is soft.  Musculoskeletal: Normal range of motion.  Skin:    General: Skin is warm and dry.  Neurological:     General: No focal deficit present.     Mental Status: He is alert.   Neurological:  Mental Status: Alert, oriented, thought content appropriate. Speech fluent without evidence of aphasia. Able to follow 2 step commands  without difficulty.  Cranial Nerves:  III,IV, VI: Peripheral visual fields grossly normal, pupils equal, round, reactive to lightptosis not present, extra-ocular motions intact bilaterally  V,VII: smile symmetric, facial light touch sensation equal VIII: hearing grossly normal bilaterally  IX,X: midline uvula rise  XI: bilateral shoulder shrug equal and strong XII: midline tongue extension  Motor:  5/5 in upper and lower extremities bilaterally including strong and equal grip strength and dorsiflexion/plantar flexion Sensory: light touch normal in all extremities.   ED Treatments / Results  Labs (all labs ordered are listed, but only abnormal results are displayed) Labs Reviewed  CBC - Abnormal; Notable for the following components:      Result Value   WBC 11.1 (*)    RBC 3.20 (*)    Hemoglobin 5.8 (*)    HCT 21.1 (*)    MCV 65.9 (*)    MCH 18.1 (*)    MCHC 27.5 (*)    RDW 21.6 (*)    Platelets 700 (*)    nRBC 0.3 (*)    All other components within normal limits  SARS CORONAVIRUS 2 (TAT 6-24 HRS)  BASIC METABOLIC PANEL  PROTIME-INR  HIV ANTIBODY (ROUTINE TESTING W REFLEX)  HIV4GL SAVE TUBE  CBC  BASIC METABOLIC PANEL  I-STAT BETA HCG BLOOD, ED ([facility], [facility], [facility] ONLY)  TYPE AND SCREEN  PREPARE RBC (CROSSMATCH)  TROPONIN I  (HIGH SENSITIVITY)    EKG EKG Interpretation  Date/Time:  Thursday March 11 2019 16:19:42 EDT Ventricular Rate:  93 PR Interval:  142 QRS Duration: 68 QT Interval:  336 QTC Calculation: 417 R Axis:   4 Text Interpretation:  Normal sinus rhythm Low voltage QRS Premature ventricular complexes Baseline wander Abnormal ekg Confirmed by Carmin Muskrat [phone]) on 03/11/2019 10:41:02 PM   Radiology Dg Chest 2 View  Result Date: 03/11/2019 CLINICAL DATA:  Central chest pain and tachycardia 4 days. EXAM: CHEST - 2 VIEW COMPARISON:  03/20/2017 FINDINGS: Lungs are adequately inflated and otherwise clear. Cardiomediastinal silhouette and remainder of the exam is unchanged. IMPRESSION: No active cardiopulmonary disease. Electronically Signed   By: Marin Olp M.D.   On: 03/11/2019 17:23    Procedures Procedures (including critical care time)  Medications Ordered in ED Medications  acetaminophen (TYLENOL) tablet 650 mg (has no administration in time range)    Or  acetaminophen (TYLENOL) suppository 650 mg (has no administration in time range)  sodium chloride flush (NS) 0.9 % injection 3 mL (3 mLs Intravenous Given 03/11/19 2317)  0.9 %  sodium chloride infusion (Manually program via Guardrails IV Fluids) (10 mL/hr Intravenous New Bag/Given 03/11/19 2329)  acetaminophen (TYLENOL) tablet 500 mg (500 mg Oral Given 03/11/19 2327)     Initial Impression / Assessment and Plan / ED Course  I have reviewed the triage vital signs and the nursing notes.  Pertinent labs & imaging results that were available during my care of the patient were reviewed by me and considered in my medical decision making (see chart for details).  Abigail Garcia is a 48 year old female who presents to the ED due to heavy menses x 3 weeks associated with palpitations and headache. Patient is afebrile, non-toxic appearing, normal heart rate, and oxygen saturation. Patient is not currently/ was never having chest pain, only  palpitations. Full neurological exam was unremarkable, no headache red flags. Suspect headache is due to low hemoglobin.  Triage obtained labs while patient was in the waiting room. Hemoglobin level of 5.8, leukocytosis of 11.1, MCV 65.9.  Troponin negative.BMP unremarkable. Spoke to patient about need for blood transfusion and discussed possible complications. Patient states understanding and agrees to get blood transfusion. Order placed for PT/INR and type and screen.Order for 2 units of PRBC have been ordered. EKG with no ST changes. Possible T-wave inversion, but appears to be similar to past EKGs.  Will give patient Tylenol for headache since she said it works. Will check back in 45 minutes. If pain is still uncontrolled will place order for Reglan and Benadryl for headache. COVID-19 test ordered for admission. Theodoro Grist, PA-C will consult Internal Medicine Teaching Service to admit patient for symptomatic anemia. Internal medicine teaching service will admit patient and will handle OBGYN consult. Patient made aware of plan.   Final Clinical Impressions(s) / ED Diagnoses   Final diagnoses:  Symptomatic anemia  Menorrhagia with irregular cycle    ED Discharge Orders    None       Jonette Eva, PA-C 03/12/19 0049    Jonette Eva, PA-C 03/12/19 0351    Carmin Muskrat, MD 03/12/19 2350

## 2019-03-12 ENCOUNTER — Encounter (HOSPITAL_COMMUNITY): Payer: Self-pay | Admitting: Internal Medicine

## 2019-03-12 ENCOUNTER — Other Ambulatory Visit: Payer: Self-pay

## 2019-03-12 ENCOUNTER — Observation Stay (HOSPITAL_COMMUNITY): Payer: No Typology Code available for payment source

## 2019-03-12 DIAGNOSIS — R002 Palpitations: Secondary | ICD-10-CM

## 2019-03-12 DIAGNOSIS — Z6841 Body Mass Index (BMI) 40.0 and over, adult: Secondary | ICD-10-CM | POA: Diagnosis not present

## 2019-03-12 DIAGNOSIS — R7989 Other specified abnormal findings of blood chemistry: Secondary | ICD-10-CM | POA: Diagnosis present

## 2019-03-12 DIAGNOSIS — Z9114 Patient's other noncompliance with medication regimen: Secondary | ICD-10-CM | POA: Diagnosis not present

## 2019-03-12 DIAGNOSIS — D259 Leiomyoma of uterus, unspecified: Secondary | ICD-10-CM | POA: Diagnosis present

## 2019-03-12 DIAGNOSIS — D62 Acute posthemorrhagic anemia: Secondary | ICD-10-CM

## 2019-03-12 DIAGNOSIS — M069 Rheumatoid arthritis, unspecified: Secondary | ICD-10-CM

## 2019-03-12 DIAGNOSIS — Z9119 Patient's noncompliance with other medical treatment and regimen: Secondary | ICD-10-CM

## 2019-03-12 DIAGNOSIS — Z791 Long term (current) use of non-steroidal anti-inflammatories (NSAID): Secondary | ICD-10-CM

## 2019-03-12 DIAGNOSIS — Z7982 Long term (current) use of aspirin: Secondary | ICD-10-CM | POA: Diagnosis not present

## 2019-03-12 DIAGNOSIS — Z9889 Other specified postprocedural states: Secondary | ICD-10-CM

## 2019-03-12 DIAGNOSIS — D649 Anemia, unspecified: Secondary | ICD-10-CM | POA: Diagnosis present

## 2019-03-12 DIAGNOSIS — R519 Headache, unspecified: Secondary | ICD-10-CM

## 2019-03-12 DIAGNOSIS — Y92009 Unspecified place in unspecified non-institutional (private) residence as the place of occurrence of the external cause: Secondary | ICD-10-CM | POA: Diagnosis not present

## 2019-03-12 DIAGNOSIS — Z9851 Tubal ligation status: Secondary | ICD-10-CM | POA: Diagnosis not present

## 2019-03-12 DIAGNOSIS — I2510 Atherosclerotic heart disease of native coronary artery without angina pectoris: Secondary | ICD-10-CM | POA: Diagnosis present

## 2019-03-12 DIAGNOSIS — D473 Essential (hemorrhagic) thrombocythemia: Secondary | ICD-10-CM

## 2019-03-12 DIAGNOSIS — M720 Palmar fascial fibromatosis [Dupuytren]: Secondary | ICD-10-CM | POA: Diagnosis present

## 2019-03-12 DIAGNOSIS — R9389 Abnormal findings on diagnostic imaging of other specified body structures: Secondary | ICD-10-CM | POA: Diagnosis not present

## 2019-03-12 DIAGNOSIS — I252 Old myocardial infarction: Secondary | ICD-10-CM | POA: Diagnosis not present

## 2019-03-12 DIAGNOSIS — N921 Excessive and frequent menstruation with irregular cycle: Secondary | ICD-10-CM

## 2019-03-12 DIAGNOSIS — D219 Benign neoplasm of connective and other soft tissue, unspecified: Secondary | ICD-10-CM | POA: Diagnosis present

## 2019-03-12 DIAGNOSIS — Z79899 Other long term (current) drug therapy: Secondary | ICD-10-CM | POA: Diagnosis not present

## 2019-03-12 DIAGNOSIS — Z91128 Patient's intentional underdosing of medication regimen for other reason: Secondary | ICD-10-CM | POA: Diagnosis not present

## 2019-03-12 DIAGNOSIS — I10 Essential (primary) hypertension: Secondary | ICD-10-CM | POA: Diagnosis present

## 2019-03-12 DIAGNOSIS — Z8249 Family history of ischemic heart disease and other diseases of the circulatory system: Secondary | ICD-10-CM | POA: Diagnosis not present

## 2019-03-12 DIAGNOSIS — Z9861 Coronary angioplasty status: Secondary | ICD-10-CM | POA: Diagnosis not present

## 2019-03-12 DIAGNOSIS — Z20828 Contact with and (suspected) exposure to other viral communicable diseases: Secondary | ICD-10-CM | POA: Diagnosis present

## 2019-03-12 DIAGNOSIS — T466X6A Underdosing of antihyperlipidemic and antiarteriosclerotic drugs, initial encounter: Secondary | ICD-10-CM | POA: Diagnosis present

## 2019-03-12 DIAGNOSIS — N939 Abnormal uterine and vaginal bleeding, unspecified: Secondary | ICD-10-CM | POA: Diagnosis not present

## 2019-03-12 DIAGNOSIS — N920 Excessive and frequent menstruation with regular cycle: Secondary | ICD-10-CM | POA: Diagnosis not present

## 2019-03-12 LAB — IRON AND TIBC
Iron: 7 ug/dL — ABNORMAL LOW (ref 28–170)
Saturation Ratios: 2 % — ABNORMAL LOW (ref 10.4–31.8)
TIBC: 410 ug/dL (ref 250–450)
UIBC: 403 ug/dL

## 2019-03-12 LAB — BASIC METABOLIC PANEL
Anion gap: 9 (ref 5–15)
BUN: 6 mg/dL (ref 6–20)
CO2: 23 mmol/L (ref 22–32)
Calcium: 8.8 mg/dL — ABNORMAL LOW (ref 8.9–10.3)
Chloride: 107 mmol/L (ref 98–111)
Creatinine, Ser: 0.68 mg/dL (ref 0.44–1.00)
GFR calc Af Amer: >60 mL/min (ref >60)
GFR calc non Af Amer: >60 mL/min (ref >60)
Glucose, Bld: 93 mg/dL (ref 70–99)
Potassium: 3.8 mmol/L (ref 3.5–5.1)
Sodium: 139 mmol/L (ref 135–145)

## 2019-03-12 LAB — CBC
HCT: 25.2 % — ABNORMAL LOW (ref 36.0–46.0)
Hemoglobin: 7.3 g/dL — ABNORMAL LOW (ref 12.0–15.0)
MCH: 20.2 pg — ABNORMAL LOW (ref 26.0–34.0)
MCHC: 29 g/dL — ABNORMAL LOW (ref 30.0–36.0)
MCV: 69.6 fL — ABNORMAL LOW (ref 80.0–100.0)
Platelets: 648 10*3/uL — ABNORMAL HIGH (ref 150–400)
RBC: 3.62 MIL/uL — ABNORMAL LOW (ref 3.87–5.11)
RDW: 23.7 % — ABNORMAL HIGH (ref 11.5–15.5)
WBC: 8.6 10*3/uL (ref 4.0–10.5)
nRBC: 0.3 % — ABNORMAL HIGH (ref 0.0–0.2)

## 2019-03-12 LAB — RETICULOCYTES
Immature Retic Fract: 42.6 % — ABNORMAL HIGH (ref 2.3–15.9)
RBC.: 3.62 MIL/uL — ABNORMAL LOW (ref 3.87–5.11)
Retic Count, Absolute: 48.1 10*3/uL (ref 19.0–186.0)
Retic Ct Pct: 1.3 % (ref 0.4–3.1)

## 2019-03-12 LAB — SARS CORONAVIRUS 2 (TAT 6-24 HRS): SARS Coronavirus 2: NEGATIVE

## 2019-03-12 LAB — LIPID PANEL
Cholesterol: 185 mg/dL (ref 0–200)
HDL: 39 mg/dL — ABNORMAL LOW (ref >40)
LDL Cholesterol: 128 mg/dL — ABNORMAL HIGH (ref 0–99)
Total CHOL/HDL Ratio: 4.7 RATIO
Triglycerides: 89 mg/dL (ref <150)
VLDL: 18 mg/dL (ref 0–40)

## 2019-03-12 LAB — FERRITIN: Ferritin: 2 ng/mL — ABNORMAL LOW (ref 11–307)

## 2019-03-12 LAB — HIV ANTIBODY (ROUTINE TESTING W REFLEX): HIV Screen 4th Generation wRfx: NONREACTIVE

## 2019-03-12 MED ORDER — ACETAMINOPHEN 325 MG PO TABS
650.0000 mg | ORAL_TABLET | Freq: Four times a day (QID) | ORAL | Status: DC | PRN
  Administered 2019-03-12 – 2019-03-14 (×5): 650 mg via ORAL
  Filled 2019-03-12 (×5): qty 2

## 2019-03-12 MED ORDER — ACETAMINOPHEN 650 MG RE SUPP: 650.0000 mg | Freq: Four times a day (QID) | RECTAL | Status: DC | PRN

## 2019-03-12 MED ORDER — SODIUM CHLORIDE 0.9 % IV SOLN
510.0000 mg | Freq: Once | INTRAVENOUS | Status: AC
  Administered 2019-03-12: 510 mg via INTRAVENOUS
  Filled 2019-03-12 (×2): qty 17

## 2019-03-12 NOTE — ED Notes (Signed)
Pt transferred to a hospital bed for comfort at this time

## 2019-03-12 NOTE — ED Notes (Signed)
Will take patient up once doctors have left bedside

## 2019-03-12 NOTE — H&P (Addendum)
Date: 03/12/2019               Patient Name:  Abigail Garcia MRN: [id number]  DOB: [birth year] Age / Sex: 48 y.o., female   PCP: Patient, No Pcp Per         Medical Service: Internal Medicine Teaching Service         Attending Physician: Dr. Lynnae January, Real Cons, MD    First Contact: Gilford Rile, MD, [place] Pager: SW [phone])  Second Contact: Myrtie Hawk, MD, [place] Pager: EM [phone])       After Hours (After 5p/  First Contact Pager: [phone]  weekends / holidays): Second Contact Pager: [phone]   Chief Complaint: palpitations    History of Present Illness: 48 y.o.  female w/ PMHx of CAD s/p PCTA, NSTEMI in 2014, RA, and iron deficiency anemia presenting with a three day history of palpitations and headache. Patient reports that symptoms were initially intermittent but have been increasing in frequency over the past few days. She states that she has a primarily right temporal headache that is throbbing in nature without radiation and with associated sensitivity to light. She reports taking naproxen and lying down which has resulted in moderate improvement of symptoms. No history of migraines. Patient also notes increasing palpitations, especially with activity and relieved with rest. She reports taking metoprolol for the palpitations which has provided some relief as well. Of note, patient has a history of menorrhagia and reports heavy menstrual bleeding for the past three weeks. She notes that she soaks through a heavy duty pad every 1-2 hours. Prior to this episode, her LMP was in July 2020. She denies any fevers, chills, chest pain, shortness of breath, dizziness/lightheadedness, vision changes, heat/cold intolerance, night sweats, abdominal pain, nausea/vomiting, melena, hematochezia, back pain, joint pain, or urinary symptoms.   ED course: Patient presented with headache and palpitations in setting of menorrhagia. She is hemodynamically stable and non-toxic appearing. Patient found to  have Hb of 5.8, MCV 65.9. No significant EKG changes. 2u pRBC ordered and patient admitted for symptomatic anemia.   Meds:  No outpatient medications have been marked as taking for the 03/11/19 encounter [facility] Encounter).  Aspirin 81mg  qd Naproxen qd Metoprolol 12.5mg  bid Crestor 20mg  qd Niferex 150mg  bid   Allergies: Allergies as of 03/11/2019  . (No Known Allergies)   Past Medical History:  Diagnosis Date  . Anemia   . CAD (coronary artery disease)    a. 11/2012 NSTEMI/Cath/PCI: LM nl, LAD 80-90 thrombotic (tx with Heparin x 2 days then aspiration thrombectomy and PTCA), RI nl, LCX sm/nl, RCA nl, EF 55-65%.  . Esophagitis    grade 1  . Hx of echocardiogram    a. Echo (6/14) with EF 55-60%.   . Menorrhagia   . NSTEMI (non-ST elevated myocardial infarction) ([place])    11/2012   . Obesity   . RA (rheumatoid arthritis) ([facility])     Family History:  Family History  Problem Relation Age of Onset  . Heart disease Other   . Breast cancer Paternal Grandmother      Social History:  Social History   Tobacco Use  . Smoking status: Never Smoker  . Smokeless tobacco: Never Used  Substance Use Topics  . Alcohol use: No  . Drug use: No  Patient lives at home with her 48 year old twins. 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 She denies any history of smoking, alcohol or illicit drug use.   OB/GYN [id number] Patient  reports 28-day cycle, period lasts for 7 days total during which patient uses 3-4 pads per day.  Currently, patient is changing pads every 1-2 hours for the past 3 weeks. No associated abdominal cramping. LMP prior to this episode was July 2020.  Patient had a D&C in 2004 after a miscarriage. Remainder of miscarriages did not require medical intervention. Hx of tubal ligation after last pregnancy.   Review of Systems: Review of Systems  Constitutional: Positive for malaise/fatigue. Negative for chills and fever.  Eyes: Positive for photophobia. Negative  for blurred vision, double vision, pain and discharge.  Respiratory: Negative for cough, shortness of breath and wheezing.   Cardiovascular: Positive for palpitations. Negative for chest pain, orthopnea, claudication, leg swelling and PND.  Gastrointestinal: Negative for abdominal pain, blood in stool, constipation, diarrhea, melena, nausea and vomiting.  Genitourinary: Negative for dysuria, frequency, hematuria and urgency.  Musculoskeletal: Negative for back pain, falls, joint pain and myalgias.  Skin: Negative for rash.  Neurological: Positive for headaches. Negative for dizziness, sensory change and focal weakness.  Endo/Heme/Allergies: Negative for polydipsia. Does not bruise/bleed easily.    Physical Exam: Blood pressure 120/80, pulse 87, temperature 97.9 F (36.6 C), temperature source Oral, resp. rate 20, last menstrual period 02/25/2019, SpO2 100 %. Physical Exam Vitals signs reviewed.  Constitutional:      General: She is not in acute distress.    Appearance: She is well-developed. She is obese. She is not toxic-appearing or diaphoretic.  HENT:     Head: Normocephalic and atraumatic.  Eyes:     General: No scleral icterus.    Extraocular Movements: Extraocular movements intact.     Comments: Pale conjunctivae  Neck:     Musculoskeletal: Normal range of motion and neck supple.  Cardiovascular:     Rate and Rhythm: Normal rate and regular rhythm.     Heart sounds: Normal heart sounds.  Pulmonary:     Effort: Pulmonary effort is normal. No respiratory distress.     Breath sounds: Normal breath sounds. No wheezing, rhonchi or rales.  Chest:     Chest wall: No mass or tenderness.  Abdominal:     General: Bowel sounds are normal.     Palpations: Abdomen is soft.     Tenderness: There is no abdominal tenderness. There is no guarding or rebound.  Musculoskeletal: Normal range of motion.  Skin:    General: Skin is warm and dry.     Capillary Refill: Capillary refill takes 2  to 3 seconds.  Neurological:     General: No focal deficit present.     Mental Status: She is alert and oriented to person, place, and time.     Motor: No weakness.    CBC Latest Ref Rng & Units 03/11/2019 03/20/2017 07/19/2016  WBC 4.0 - 10.5 K/uL 11.1(H) 12.2(H) 7.7  Hemoglobin 12.0 - 15.0 g/dL 5.8(LL) 11.6(L) 12.1  Hematocrit 36.0 - 46.0 % 21.1(L) 36.6 36.9  Platelets 150 - 400 K/uL 700(H) 457(H) 354   BMP Latest Ref Rng & Units 03/11/2019 03/20/2017 02/15/2016  Glucose 70 - 99 mg/dL 98 103(H) 97  BUN 6 - 20 mg/dL 7 13 14   Creatinine 0.44 - 1.00 mg/dL 0.77 0.75 0.70  BUN/Creat Ratio 9 - 23 - - -  Sodium 135 - 145 mmol/L 138 133(L) 136  Potassium 3.5 - 5.1 mmol/L 3.9 3.5 3.8  Chloride 98 - 111 mmol/L 104 101 104  CO2 22 - 32 mmol/L 26 23 24   Calcium 8.9 - 10.3  mg/dL 8.9 9.1 9.8   PT: 13.5 INR 1.0 Trop <2 bHCG <5.0  EKG: personally reviewed my interpretation is low voltage QRS, normal sinus rhythm.   CXR: personally reviewed my interpretation is no active cardiopulmonary disease.  PELVIC US WITH TVUS:  IMPRESSION: 1. Enlarged uterus with multiple fibroids measuring up to 2.7 cm. Transvaginal images were limited by the presence of multiple fibroids. 2. Poorly visualized endometrial stripe. Abnormal endometrial thickening versus mass within the cervix measuring up to 2.4 cm. 3. Nonvisualized right ovary.  Negative for left ovarian torsion  Assessment & Plan by Problem: Active Problems:   Symptomatic anemia  Patient is a 47yo female with PMHx of CAD s/p PCTA in 2016, RA and IDA presenting with three days of a primarily right temporal headache and palpitations secondary to severe anemia in setting of menorrhagia for 3 weeks. Found to have multiple fibroids on pelvic US with possible endometrial thickening vs. Cervical mass.   Symptomatic anemia:  Patient with history of iron deficiency anemia (previously requiring iron transfusions) presenting with three days of worsening  palpitations and headaches. Patient has tried symptomatic treatment of palpitations with metoprolol and headaches with naproxen with only moderate relief in symptoms. Patient has had extensive work up for her iron deficiency anemia and was prescribed Niferex previously but reports that she no longer takes this. Cause of her anemia is thought to be chronic blood loss/malabsorption syndrome vs thalassemia trait.  Patient has not seen oncologist since 2018 when she was told that she did not qualify for IV iron transfusions but was instructed to continue iron supplementation. However, patient reports that she does not take prescribed iron supplementation and instead, takes a liquid iron. Patient slightly tachycardic on examination with pale conjunctivae. Hb 5.8, MCV 65.9 in the ED. In setting of her menorrhagia, anemia is likely due to blood loss.  - Transfuse 2u pRBC - Iron panel - CBC  Thrombocytosis: Hx of thrombocytosis that is thought to be likely reactive to the chronic anemia. Plt 700 today. - Continue to monitor  Abnormal Uterine Bleeding:  Patient with history of menorrhagia and uterine fibroids presenting with menorrhagia for 3 weeks requiring her to change sanitary pads every 1-2 hours due to heavy flow.  No associated abdominal cramping. Prior workup in 2014 and 2017 significant for multiple uterine fibroids (3 in 2014, 2 in 2017). Pelvic US today consistent with multiple uterine fibroids (5 total), measuring up to 2.7cm. Also noted to have increased endometrial thickening vs mass in the cervix measuring up to 2.4cm that is new from prior examinations. Patient has had a benign cervical polyp that was removed in 2017 without any complications. Prior pap smear and endometrial biopsy results in 2017 were normal.  - OB/GYN evaluation  Hx of CAD:  Patient with Hx of CAD s/p NSTEMI with thrombus in mid LAD and underwent aspiration thrombectomy and percutaneous transluminal coronary angioplasty (PCTA)  in 2014 and supposed to be on beta blocker, aspirin and statin. However, patient reports compliance with only aspirin and has not had a follow up with PCP or cardiologist since 2018. Patient reports that she has been feeling well and has not had a need to see a doctor. Of note, patient does report taking her metoprolol 12.5mg  bid for the past week for palpitations which resulted in some improvement.  - Resume beta blocker, aspirin and statin on discharge  Diet: Heart Healthy Code: Full VTE Prophylaxis: SCDs  Dispo: Admit patient to Observation with expected length of stay  less than 2 midnights.  SignedHarvie Heck, MD 03/12/2019, 3:17 AM  Pager: [phone]

## 2019-03-12 NOTE — ED Notes (Addendum)
Patient not in room, transported to Ultrasound

## 2019-03-12 NOTE — ED Notes (Signed)
Tele   Breakfast ordered  

## 2019-03-12 NOTE — ED Notes (Signed)
Receiving blood right now, unable to draw labs

## 2019-03-12 NOTE — ED Notes (Signed)
Pt not available for initiation of blood transfusion. RN will return blood to blood bank at this time

## 2019-03-12 NOTE — Progress Notes (Signed)
Subjective:  Ms. Aneesha was seen at bedside this morning. She states that the headache and palpitations she was having this morning are currently resolving. We spoke about her needing to continue taking her heart medications, especially in the setting of her Rheumatoid arthritis and previous MI. She voiced her understanding. She did state that she discontinued her statin due to nausea and vomiting. We discussed starting a different statin regime, and to monitor her for nausea or vomiting. She was agreeable to the plan. All questions and concerns were addressed.   Objective:  Vital signs in last 24 hours: Vitals:   03/12/19 0512 03/12/19 0515 03/12/19 0557 03/12/19 0600  BP: 130/90 (!) 130/96 119/84 118/73  Pulse: 78 79 85 79  Resp: 17 17 18 14   Temp: 97.9 F (36.6 C)  97.9 F (36.6 C)   TempSrc: Oral  Oral   SpO2: 98% 99% 100% 99%   Physical Exam Constitutional:      General: She is not in acute distress.    Appearance: She is well-developed. She is not ill-appearing, toxic-appearing or diaphoretic.  HENT:     Head: Normocephalic and atraumatic.  Cardiovascular:     Heart sounds: Normal heart sounds. Heart sounds not distant. No murmur. No systolic murmur.  Pulmonary:     Effort: Pulmonary effort is normal. No tachypnea or respiratory distress.     Breath sounds: Normal breath sounds. No stridor. No decreased breath sounds, wheezing or rhonchi.  Abdominal:     General: Bowel sounds are normal.     Palpations: Abdomen is soft. There is no mass.     Tenderness: There is no abdominal tenderness. There is no guarding or rebound.  Neurological:     Mental Status: She is alert and oriented to person, place, and time.  Psychiatric:        Mood and Affect: Mood normal.        Behavior: Behavior normal.     Assessment/Plan:  Active Problems:   Symptomatic anemia  Symptomatic Anemia: - Patient presented to [facility] for heart palpitations and increasing headaches that are  located in R temporal region over the past several days. She endorses menorrhagia for 3 weeks and going through 1 heavy pad every 2 hours. She reports that today she continues to bleed, but it appears to be less in volume than the past 3 weeks. Her blood work showed a Hg of 5.8. She received 2 units of blood and her Hg came back at 7.3. She states that upon receiving her blood that her palpitations and headaches are resolving.  - Hg: 5.8 - Transfused 2 units with a Hg of 7.3 - Continue to monitor vitals - Continue to monitor CBC  CAD:  - Patient has a PMH of a NSTEMI in 11/2012, and has been noncompliant with her preventative measures. She states that she does take aspirin at home, and has recently started her metoprolol for her recent palpitations, but has not been taking it previously. She will be D/C on Rosuvastatin, ASA, and lopresor.  - Start Rosuvastatin 20 mg - Continue ASA 81 mg - Continue Lopressor 25 mg.   Thrombocytosis:  - Likely reactive to chronic anemia - Plt 700  Abnormal Uterine Bleeding:  Patient reports menorrhagia for 3 weeks and going through pads every 1-2 hours. She had a pelvic U/S which showed multiple uterine fibroids (5 in all), measuring up to 2.4cm that is new from prior examinations.  - OB/GYN consultation outpatient.   Rheumatoid  Arthritis:  Currently taking naproxen for her RA. She will follow up outpatient to start seeing a rheumatologist again now that she has new insurance.   Dispo: Anticipated discharge in approximately 1-2 day(s).   Maudie Mercury, MD 03/12/2019, 8:10 AM Pager: [phone]

## 2019-03-13 ENCOUNTER — Inpatient Hospital Stay (HOSPITAL_COMMUNITY): Payer: No Typology Code available for payment source | Admitting: Certified Registered Nurse Anesthetist

## 2019-03-13 ENCOUNTER — Encounter (HOSPITAL_COMMUNITY): Payer: Self-pay | Admitting: Certified Registered Nurse Anesthetist

## 2019-03-13 ENCOUNTER — Encounter (HOSPITAL_COMMUNITY)
Admission: EM | Disposition: A | Discharge status: Home / Self Care | Payer: Self-pay | Source: Home / Self Care | Attending: Internal Medicine

## 2019-03-13 DIAGNOSIS — R9389 Abnormal findings on diagnostic imaging of other specified body structures: Secondary | ICD-10-CM

## 2019-03-13 DIAGNOSIS — D649 Anemia, unspecified: Secondary | ICD-10-CM

## 2019-03-13 DIAGNOSIS — N939 Abnormal uterine and vaginal bleeding, unspecified: Secondary | ICD-10-CM

## 2019-03-13 HISTORY — PX: DILATION AND CURETTAGE OF UTERUS: SHX78

## 2019-03-13 LAB — CBC
HCT: 25.2 % — ABNORMAL LOW (ref 36.0–46.0)
HCT: 28.1 % — ABNORMAL LOW (ref 36.0–46.0)
HCT: 28.5 % — ABNORMAL LOW (ref 36.0–46.0)
Hemoglobin: 7.5 g/dL — ABNORMAL LOW (ref 12.0–15.0)
Hemoglobin: 8 g/dL — ABNORMAL LOW (ref 12.0–15.0)
Hemoglobin: 8.3 g/dL — ABNORMAL LOW (ref 12.0–15.0)
MCH: 20.4 pg — ABNORMAL LOW (ref 26.0–34.0)
MCH: 19.8 pg — ABNORMAL LOW (ref 26.0–34.0)
MCH: 20.1 pg — ABNORMAL LOW (ref 26.0–34.0)
MCHC: 29.8 g/dL — ABNORMAL LOW (ref 30.0–36.0)
MCHC: 28.5 g/dL — ABNORMAL LOW (ref 30.0–36.0)
MCHC: 29.1 g/dL — ABNORMAL LOW (ref 30.0–36.0)
MCV: 68.7 fL — ABNORMAL LOW (ref 80.0–100.0)
MCV: 69.6 fL — ABNORMAL LOW (ref 80.0–100.0)
MCV: 69.2 fL — ABNORMAL LOW (ref 80.0–100.0)
Platelets: 613 10*3/uL — ABNORMAL HIGH (ref 150–400)
Platelets: 719 10*3/uL — ABNORMAL HIGH (ref 150–400)
Platelets: 708 10*3/uL — ABNORMAL HIGH (ref 150–400)
RBC: 3.67 MIL/uL — ABNORMAL LOW (ref 3.87–5.11)
RBC: 4.04 MIL/uL (ref 3.87–5.11)
RBC: 4.12 MIL/uL (ref 3.87–5.11)
RDW: 23.3 % — ABNORMAL HIGH (ref 11.5–15.5)
RDW: 23.9 % — ABNORMAL HIGH (ref 11.5–15.5)
RDW: 23.8 % — ABNORMAL HIGH (ref 11.5–15.5)
WBC: 9.3 10*3/uL (ref 4.0–10.5)
WBC: 10.4 10*3/uL (ref 4.0–10.5)
WBC: 11.7 10*3/uL — ABNORMAL HIGH (ref 4.0–10.5)
nRBC: 0.5 % — ABNORMAL HIGH (ref 0.0–0.2)
nRBC: 1.1 % — ABNORMAL HIGH (ref 0.0–0.2)
nRBC: 0.9 % — ABNORMAL HIGH (ref 0.0–0.2)

## 2019-03-13 LAB — TYPE AND SCREEN
ABO/RH(D): O POS
Antibody Screen: NEGATIVE
Unit division: 0
Unit division: 0

## 2019-03-13 LAB — BPAM RBC
Blood Product Expiration Date: 202011022359
Blood Product Expiration Date: 202011022359
ISSUE DATE / TIME: 202010090253
ISSUE DATE / TIME: 202010090528
Unit Type and Rh: 5100
Unit Type and Rh: 5100

## 2019-03-13 SURGERY — DILATION AND CURETTAGE
Anesthesia: General

## 2019-03-13 MED ORDER — BUPIVACAINE HCL (PF) 0.5 % IJ SOLN
INTRAMUSCULAR | Status: AC
  Filled 2019-03-13: qty 30

## 2019-03-13 MED ORDER — SUCCINYLCHOLINE CHLORIDE 200 MG/10ML IV SOSY
PREFILLED_SYRINGE | INTRAVENOUS | Status: AC
  Filled 2019-03-13: qty 10

## 2019-03-13 MED ORDER — LACTATED RINGERS IV SOLN
INTRAVENOUS | Status: DC
  Administered 2019-03-13: 14:00:00 via INTRAVENOUS

## 2019-03-13 MED ORDER — LIDOCAINE 2% (20 MG/ML) 5 ML SYRINGE
INTRAMUSCULAR | Status: AC
  Filled 2019-03-13: qty 5

## 2019-03-13 MED ORDER — ONDANSETRON HCL 4 MG/2ML IJ SOLN
INTRAMUSCULAR | Status: DC | PRN
  Administered 2019-03-13: 4 mg via INTRAVENOUS

## 2019-03-13 MED ORDER — BUPIVACAINE HCL 0.5 % IJ SOLN
INTRAMUSCULAR | Status: DC | PRN
  Administered 2019-03-13: 30 mL

## 2019-03-13 MED ORDER — MIDAZOLAM HCL 5 MG/5ML IJ SOLN
INTRAMUSCULAR | Status: DC | PRN
  Administered 2019-03-13: 1 mg via INTRAVENOUS

## 2019-03-13 MED ORDER — PROPOFOL 10 MG/ML IV BOLUS
INTRAVENOUS | Status: AC
  Filled 2019-03-13: qty 20

## 2019-03-13 MED ORDER — PROMETHAZINE HCL 25 MG/ML IJ SOLN: 6.2500 mg | INTRAMUSCULAR | Status: DC | PRN

## 2019-03-13 MED ORDER — MIDAZOLAM HCL 2 MG/2ML IJ SOLN
INTRAMUSCULAR | Status: AC
  Filled 2019-03-13: qty 2

## 2019-03-13 MED ORDER — PROPOFOL 10 MG/ML IV BOLUS
INTRAVENOUS | Status: DC | PRN
  Administered 2019-03-13: 200 mg via INTRAVENOUS

## 2019-03-13 MED ORDER — DEXAMETHASONE SODIUM PHOSPHATE 10 MG/ML IJ SOLN
INTRAMUSCULAR | Status: AC
  Filled 2019-03-13: qty 1

## 2019-03-13 MED ORDER — ROSUVASTATIN CALCIUM 20 MG PO TABS
20.0000 mg | ORAL_TABLET | Freq: Every day | ORAL | Status: DC
  Administered 2019-03-13 – 2019-03-14 (×2): 20 mg via ORAL
  Filled 2019-03-13 (×2): qty 1

## 2019-03-13 MED ORDER — MEGESTROL ACETATE 40 MG PO TABS
40.0000 mg | ORAL_TABLET | Freq: Two times a day (BID) | ORAL | Status: DC
  Administered 2019-03-13 – 2019-03-14 (×4): 40 mg via ORAL
  Filled 2019-03-13 (×4): qty 1

## 2019-03-13 MED ORDER — FENTANYL CITRATE (PF) 100 MCG/2ML IJ SOLN
INTRAMUSCULAR | Status: DC | PRN
  Administered 2019-03-13 (×2): 50 ug via INTRAVENOUS

## 2019-03-13 MED ORDER — LIDOCAINE 2% (20 MG/ML) 5 ML SYRINGE
INTRAMUSCULAR | Status: DC | PRN
  Administered 2019-03-13: 100 mg via INTRAVENOUS

## 2019-03-13 MED ORDER — METOPROLOL TARTRATE 12.5 MG HALF TABLET
12.5000 mg | ORAL_TABLET | Freq: Two times a day (BID) | ORAL | Status: DC
  Administered 2019-03-13 – 2019-03-14 (×3): 12.5 mg via ORAL
  Filled 2019-03-13 (×3): qty 1

## 2019-03-13 MED ORDER — ROCURONIUM BROMIDE 10 MG/ML (PF) SYRINGE
PREFILLED_SYRINGE | INTRAVENOUS | Status: AC
  Filled 2019-03-13: qty 10

## 2019-03-13 MED ORDER — FENTANYL CITRATE (PF) 100 MCG/2ML IJ SOLN: 25.0000 ug | INTRAMUSCULAR | Status: DC | PRN

## 2019-03-13 MED ORDER — DEXAMETHASONE SODIUM PHOSPHATE 10 MG/ML IJ SOLN
INTRAMUSCULAR | Status: DC | PRN
  Administered 2019-03-13: 10 mg via INTRAVENOUS

## 2019-03-13 MED ORDER — SUCCINYLCHOLINE CHLORIDE 200 MG/10ML IV SOSY
PREFILLED_SYRINGE | INTRAVENOUS | Status: DC | PRN
  Administered 2019-03-13: 120 mg via INTRAVENOUS

## 2019-03-13 MED ORDER — FENTANYL CITRATE (PF) 250 MCG/5ML IJ SOLN
INTRAMUSCULAR | Status: AC
  Filled 2019-03-13: qty 5

## 2019-03-13 MED ORDER — ONDANSETRON HCL 4 MG/2ML IJ SOLN
INTRAMUSCULAR | Status: AC
  Filled 2019-03-13: qty 2

## 2019-03-13 MED ORDER — PHENYLEPHRINE 40 MCG/ML (10ML) SYRINGE FOR IV PUSH (FOR BLOOD PRESSURE SUPPORT)
PREFILLED_SYRINGE | INTRAVENOUS | Status: AC
  Filled 2019-03-13: qty 10

## 2019-03-13 SURGICAL SUPPLY — 14 items
CONT SPEC 4OZ CLIKSEAL STRL BL (MISCELLANEOUS) ×3 IMPLANT
DILATOR CANAL MILEX (MISCELLANEOUS) IMPLANT
GLOVE BIO SURGEON STRL SZ 6.5 (GLOVE) ×2 IMPLANT
GLOVE BIO SURGEONS STRL SZ 6.5 (GLOVE) ×1
GLOVE BIOGEL PI IND STRL 7.0 (GLOVE) ×1 IMPLANT
GLOVE BIOGEL PI INDICATOR 7.0 (GLOVE) ×2
GOWN STRL REUS W/ TWL LRG LVL3 (GOWN DISPOSABLE) ×2 IMPLANT
GOWN STRL REUS W/TWL LRG LVL3 (GOWN DISPOSABLE) ×4
KIT TURNOVER KIT B (KITS) ×3 IMPLANT
NEEDLE SPNL 18GX3.5 QUINCKE PK (NEEDLE) ×3 IMPLANT
PACK VAGINAL MINOR WOMEN LF (CUSTOM PROCEDURE TRAY) ×3 IMPLANT
PAD OB MATERNITY 4.3X12.25 (PERSONAL CARE ITEMS) ×3 IMPLANT
TOWEL GREEN STERILE FF (TOWEL DISPOSABLE) ×6 IMPLANT
UNDERPAD 30X30 (UNDERPADS AND DIAPERS) ×3 IMPLANT

## 2019-03-13 NOTE — Anesthesia Postprocedure Evaluation (Signed)
Anesthesia Post Note  Patient: [name]  Procedure(s) Performed: DILATATION AND CURETTAGE (N/A )     Patient location during evaluation: PACU Anesthesia Type: General Level of consciousness: awake and alert Pain management: pain level controlled Vital Signs Assessment: post-procedure vital signs reviewed and stable Respiratory status: spontaneous breathing, nonlabored ventilation and respiratory function stable Cardiovascular status: blood pressure returned to baseline and stable Postop Assessment: no apparent nausea or vomiting Anesthetic complications: no    Last Vitals:  Vitals:   03/13/19 1808 03/13/19 2004  BP: 138/85 120/76  Pulse: 88 99  Resp: 17 18  Temp: 36.6 C 36.7 C  SpO2: 96% 96%    Last Pain:  Vitals:   03/13/19 2004  TempSrc: Oral  PainSc:                  Catalina Gravel

## 2019-03-13 NOTE — Progress Notes (Signed)
Subjective:  Abigail Garcia was seen at bedside this morning. She states that she is concerned with how her blood is doing. We stated that her Hg levels were stable. We spoke about following outpatient to watch her Hg. We also spoke about following up with her OBGYN in the outpatient setting to help manage her AUB. All questions and concerns were addressed.   Objective:  Vital signs in last 24 hours: Vitals:   03/12/19 1246 03/12/19 1929 03/13/19 0430 03/13/19 0756  BP: 122/79 124/82 140/84 127/82  Pulse: 91 88 78 91  Resp: 18 18 18 19   Temp: 98.1 F (36.7 C) 98.7 F (37.1 C) 97.8 F (36.6 C) 98.4 F (36.9 C)  TempSrc: Oral Oral Oral Oral  SpO2: 97% 99% 94% 100%  Weight:      Height:       Physical Exam Constitutional:      General: She is not in acute distress.    Appearance: She is well-developed. She is not ill-appearing or toxic-appearing.     Comments: Female lying comfortably in bed. Appears anxious during examination.   HENT:     Head: Normocephalic and atraumatic.  Cardiovascular:     Rate and Rhythm: Normal rate and regular rhythm.     Heart sounds: Normal heart sounds. No murmur. No systolic murmur. No friction rub. No gallop.   Pulmonary:     Effort: Pulmonary effort is normal. No tachypnea or respiratory distress.     Breath sounds: Normal breath sounds. No decreased breath sounds, wheezing or rhonchi.  Abdominal:     General: Bowel sounds are normal.     Palpations: Abdomen is soft.  Musculoskeletal:     Right lower leg: No edema.     Left lower leg: No edema.  Neurological:     Mental Status: She is alert.  Psychiatric:        Mood and Affect: Mood is anxious.        Behavior: Behavior normal.     Assessment/Plan:  Active Problems:   Symptomatic anemia  Symptomatic Anemia: - Patient presented to [facility] for heart palpitations and increasing headaches that are located in R temporal region over the past several days. She endorses menorrhagia for 3  weeks and going through 1 heavy pad every 2 hours. She reports that today she continues to bleed, but it appears to be less in volume than the past 3 weeks. Her blood work showed a Hg of 5.8. She received 2 units of blood and her current Hg is 7.5. She states that upon receiving her blood that her palpitations and headaches are resolving.  - Hg: 7.5 - Continue to monitor vitals - Continue to monitor CBC  CAD:  - Patient has a PMH of a NSTEMI in 11/2012, and has been noncompliant with her preventative measures. She states that she does take aspirin at home, and has recently started her metoprolol for her recent palpitations, but has not been taking it previously. She will be D/C on Rosuvastatin, ASA, and lopresor.  - Continue Rosuvastatin 20 mg - Continue ASA 81 mg   Thrombocytosis:  - Likely reactive to chronic anemia - Plt 700  Abnormal Uterine Bleeding:  Patient reports menorrhagia for 3 weeks and going through pads every 1-2 hours. She had a pelvic U/S which showed multiple uterine fibroids (5 itotal), measuring up to 2.4cm that is new from prior examinations.  - Reached out to OBGYN today for a consultation. We appreciate their assistance with  Abigail Garcia health management.  Rheumatoid Arthritis:  Currently taking naproxen for her RA. She will follow up outpatient to start seeing a rheumatologist again now that she has new insurance.   Dispo: Anticipated discharge in approximately 1-2 days.   Maudie Mercury, MD 03/13/2019, 12:01 PM Pager: [phone]

## 2019-03-13 NOTE — Progress Notes (Signed)
RN received confirmation from MD, that Pt does not need to go back on tele

## 2019-03-13 NOTE — Anesthesia Preprocedure Evaluation (Signed)
Anesthesia Evaluation  Patient identified by MRN, date of birth, ID band Patient awake    Reviewed: Allergy & Precautions, NPO status , Patient's Chart, lab work & pertinent test results, reviewed documented beta blocker date and time   Airway Mallampati: II  TM Distance: >3 FB Neck ROM: Full    Dental  (+) Teeth Intact, Dental Advisory Given   Pulmonary neg pulmonary ROS,    Pulmonary exam normal breath sounds clear to auscultation       Cardiovascular hypertension, Pt. on home beta blockers + CAD and + Past MI  Normal cardiovascular exam Rhythm:Regular Rate:Normal     Neuro/Psych negative neurological ROS  negative psych ROS   GI/Hepatic Neg liver ROS, GERD  ,  Endo/Other  Morbid obesity  Renal/GU negative Renal ROS     Musculoskeletal  (+) Arthritis , Rheumatoid disorders,    Abdominal   Peds  Hematology  (+) Blood dyscrasia, anemia ,   Anesthesia Other Findings Day of surgery medications reviewed with the patient.  Reproductive/Obstetrics                             Anesthesia Physical Anesthesia Plan  ASA: III and emergent  Anesthesia Plan: General   Post-op Pain Management:    Induction: Intravenous and Rapid sequence  PONV Risk Score and Plan: 4 or greater and Dexamethasone, Ondansetron and Midazolam  Airway Management Planned: Oral ETT  Additional Equipment:   Intra-op Plan:   Post-operative Plan: Extubation in OR  Informed Consent: I have reviewed the patients History and Physical, chart, labs and discussed the procedure including the risks, benefits and alternatives for the proposed anesthesia with the patient or authorized representative who has indicated his/her understanding and acceptance.     Dental advisory given  Plan Discussed with: CRNA  Anesthesia Plan Comments:         Anesthesia Quick Evaluation

## 2019-03-13 NOTE — Op Note (Signed)
03/11/2019 - 03/13/2019  2:14 PM  PATIENT:  Abigail Garcia  48 y.o. female  PRE-OPERATIVE DIAGNOSIS: heavy vaginal bleeding, thickened endometrium, anemia, morbid obestiy  POST-OPERATIVE DIAGNOSIS: same  PROCEDURE:  Procedure(s): DILATATION AND CURETTAGE (N/A)  SURGEON:  Surgeon(s) and Role:    * Carita Sollars, Wilhemina Cash, MD - Primary    * Fair, Marin Shutter, MD - Assisting   ANESTHESIA:   local and general  EBL:  minimal   BLOOD ADMINISTERED:none  DRAINS: none   LOCAL MEDICATIONS USED:  MARCAINE     SPECIMEN:  Source of Specimen:  endometrial curettings  DISPOSITION OF SPECIMEN:  PATHOLOGY  COUNTS:  YES  TOURNIQUET:  * No tourniquets in log *  DICTATION: .Dragon Dictation  PLAN OF CARE: Admit to inpatient   PATIENT DISPOSITION:  PACU - hemodynamically stable.   Delay start of Pharmacological VTE agent (>24hrs) due to surgical blood loss or risk of bleeding: yes    The risks, benefits, and alternatives of surgery were explained, understood, and accepted. All questions were answered. Consents were signed. In the operating room general anesthesia was applied without complication, and she was placed in the dorsal lithotomy position. Her vagina was prepped and draped in the usual sterile fashion.  A bimanual exam revealed a 10 week size , anteverted mobile uterus. She has a wide pubic arch, should she need a vaginal hysterectomy. Her adnexa were nonenlarged. A speculum was placed and a single-tooth tenaculum was used to grasp the anterior lip of her cervix. A total of 30 mL of 0.5% Marcaine was used to perform a paracervical block. Her uterus sounded to 14 cm. Her cervix was already dilated. A curettage was done in all quadrants and the fundus of the uterus. A large amount of polypoid-type  tissue was obtained. A gritty sensation was appreciated throughout. There was no bleeding noted at the end of the case. She was taken to the recovery room after being extubated. She tolerated the procedure  well.

## 2019-03-13 NOTE — Discharge Instructions (Signed)
Dilation and Curettage or Vacuum Curettage, Care After °This sheet gives you information about how to care for yourself after your procedure. Your health care provider may also give you more specific instructions. If you have problems or questions, contact your health care provider. °What can I expect after the procedure? °After your procedure, it is common to have: °· Mild pain or cramping. °· Some vaginal bleeding or spotting. °These may last for up to 2 weeks after your procedure. °Follow these instructions at home: °Activity ° °· Do not drive or use heavy machinery while taking prescription pain medicine. °· Avoid driving for the first 24 hours after your procedure. °· Take frequent, short walks, followed by rest periods, throughout the day. Ask your health care provider what activities are safe for you. After 1-2 days, you may be able to return to your normal activities. °· Do not lift anything heavier than 10 lb (4.5 kg) until your health care provider approves. °· For at least 2 weeks, or as long as told by your health care provider, do not: °? Douche. °? Use tampons. °? Have sexual intercourse. °General instructions ° °· Take over-the-counter and prescription medicines only as told by your health care provider. This is especially important if you take blood thinning medicine. °· Do not take baths, swim, or use a hot tub until your health care provider approves. Take showers instead of baths. °· Wear compression stockings as told by your health care provider. These stockings help to prevent blood clots and reduce swelling in your legs. °· It is your responsibility to get the results of your procedure. Ask your health care provider, or the department performing the procedure, when your results will be ready. °· Keep all follow-up visits as told by your health care provider. This is important. °Contact a health care provider if: °· You have severe cramps that get worse or that do not get better with  medicine. °· You have severe abdominal pain. °· You cannot drink fluids without vomiting. °· You develop pain in a different area of your pelvis. °· You have bad-smelling vaginal discharge. °· You have a rash. °Get help right away if: °· You have vaginal bleeding that soaks more than one sanitary pad in 1 hour, for 2 hours in a row. °· You pass large blood clots from your vagina. °· You have a fever that is above 100.4°F (38.0°C). °· Your abdomen feels very tender or hard. °· You have chest pain. °· You have shortness of breath. °· You cough up blood. °· You feel dizzy or light-headed. °· You faint. °· You have pain in your neck or shoulder area. °This information is not intended to replace advice given to you by your health care provider. Make sure you discuss any questions you have with your health care provider. °Document Released: 05/17/2000 Document Revised: 05/02/2017 Document Reviewed: 12/21/2015 °Elsevier Patient Education © 2020 Elsevier Inc. ° °

## 2019-03-13 NOTE — Progress Notes (Signed)
RN paged MD top see if Pt needs to go back on Cardiac monitoring

## 2019-03-13 NOTE — Consult Note (Signed)
Reason for Consult:new onset heavy vaginal bleeding with anemia   Abigail Garcia is an 48 y.o. divorced P4 (s/p BTL) admitted with symptomatic anemia. She has a long h/o anemia and has thalasemia trait. She was admitted with a hbg of 5.8 and given 2 units of PRBCs. She says that her LNMP was 7/20 and she did not have any other bleeding until a few days ago when it became heavy. She saw Dr. Emeterio Reeve in 2017 and was found to have several small (2.5cm ) fibroids. They were asymptomatic at the time. An u/s done yesterday showed an 11.7 x 7.7 cm uterus with multiple small fibroids. The endometrium was 2.4 cm with a endometrial thickening versus a intracervical mass.    Patient's last menstrual period was 02/25/2019.    Past Medical History:  Diagnosis Date  . Anemia   . CAD (coronary artery disease)    a. 11/2012 NSTEMI/Cath/PCI: LM nl, LAD 80-90 thrombotic (tx with Heparin x 2 days then aspiration thrombectomy and PTCA), RI nl, LCX sm/nl, RCA nl, EF 55-65%.  . Esophagitis    grade 1  . Hx of echocardiogram    a. Echo (6/14) with EF 55-60%.   . Menorrhagia   . NSTEMI (non-ST elevated myocardial infarction) ([place])    11/2012   . Obesity   . RA (rheumatoid arthritis) ([place])     Past Surgical History:  Procedure Laterality Date  . bilateral tubal    . CARDIAC CATHETERIZATION  2014  . CESAREAN SECTION    . CORONARY ANGIOGRAM  11/19/2012   Procedure: CORONARY ANGIOGRAM;  Surgeon: Peter M Martinique, MD;  Location: [facility] C[facility] LAB;  Service: Cardiovascular;;  . DILATION AND CURETTAGE OF UTERUS    . INTRAVASCULAR ULTRASOUND  11/19/2012   Procedure: INTRAVASCULAR ULTRASOUND;  Surgeon: Peter M Martinique, MD;  Location: [facility] CATH LAB;  Service: Cardiovascular;;  . LEFT HEART CATHETERIZATION WITH CORONARY ANGIOGRAM N/A 11/16/2012   Procedure: LEFT HEART CATHETERIZATION WITH CORONARY ANGIOGRAM;  Surgeon: Peter M Martinique, MD;  Location: [facility] CATH LAB;  Service: Cardiovascular;  Laterality: N/A;  . PERCUTANEOUS  CORONARY INTERVENTION-BALLOON ONLY  11/19/2012   Procedure: PERCUTANEOUS CORONARY INTERVENTION-BALLOON ONLY;  Surgeon: Peter M Martinique, MD;  Location: [facility] CATH LAB;  Service: Cardiovascular;;    Family History  Problem Relation Age of Onset  . Heart disease Other   . Breast cancer Paternal Grandmother     Social History:  reports that she has never smoked. She has never used smokeless tobacco. She reports that she does not drink alcohol or use drugs.  Allergies: No Known Allergies  Medications: I have reviewed the patient's current medications.  ROS  Blood pressure 127/82, pulse 91, temperature 98.4 F (36.9 C), temperature source Oral, resp. rate 19, height 5\' 3"  (1.6 m), weight 115.5 kg, last menstrual period 02/25/2019, SpO2 100 %. Physical Exam Breathing, conversing,  normally Well nourished, well hydrated Black female, no apparent distress Abd- benign, obese  Results for orders placed or performed during the hospital encounter of 03/11/19 (from the past 48 hour(s))  Basic metabolic panel     Status: None   Collection Time: 03/11/19  4:24 PM  Result Value Ref Range   Sodium 138 135 - 145 mmol/L   Potassium 3.9 3.5 - 5.1 mmol/L   Chloride 104 98 - 111 mmol/L   CO2 26 22 - 32 mmol/L   Glucose, Bld 98 70 - 99 mg/dL   BUN 7 6 - 20 mg/dL   Creatinine, Ser 0.77  0.44 - 1.00 mg/dL   Calcium 8.9 8.9 - 10.3 mg/dL   GFR calc non Af Amer >60 >60 mL/min   GFR calc Af Amer >60 >60 mL/min   Anion gap 8 5 - 15    Comment: Performed at [facility] [address], [place], [place] [address]  CBC     Status: Abnormal   Collection Time: 03/11/19  4:24 PM  Result Value Ref Range   WBC 11.1 (H) 4.0 - 10.5 K/uL   RBC 3.20 (L) 3.87 - 5.11 MIL/uL   Hemoglobin 5.8 (LL) 12.0 - 15.0 g/dL    Comment: REPEATED TO VERIFY Reticulocyte Hemoglobin testing may be clinically indicated, consider ordering this additional test [id number] THIS CRITICAL RESULT HAS VERIFIED AND BEEN CALLED TO A  OLEARY,RN BY DENNIS BRADLEY ON 10 08 2020 AT 88, AND HAS BEEN READ BACK.     HCT 21.1 (L) 36.0 - 46.0 %   MCV 65.9 (L) 80.0 - 100.0 fL   MCH 18.1 (L) 26.0 - 34.0 pg   MCHC 27.5 (L) 30.0 - 36.0 g/dL   RDW 21.6 (H) 11.5 - 15.5 %   Platelets 700 (H) 150 - 400 K/uL   nRBC 0.3 (H) 0.0 - 0.2 %    Comment: Performed at [facility] [address], [place], [place] [address]  Troponin I (High Sensitivity)     Status: None   Collection Time: 03/11/19  4:24 PM  Result Value Ref Range   Troponin I (High Sensitivity) <2 <18 ng/L    Comment: Performed at [facility] [address], [place], [place] [address]  I-Stat beta hCG blood, ED     Status: None   Collection Time: 03/11/19  4:49 PM  Result Value Ref Range   I-stat hCG, quantitative <5.0 <5 mIU/mL   Comment 3            Comment:   GEST. AGE      CONC.  (mIU/mL)   <=1 WEEK        5 - 50     2 WEEKS       50 - 500     3 WEEKS       100 - 10,000     4 WEEKS     1,000 - 30,000        FEMALE AND NON-PREGNANT FEMALE:     LESS THAN 5 mIU/mL   Type and screen [place]     Status: None   Collection Time: 03/11/19 11:00 PM  Result Value Ref Range   ABO/RH(D) O POS    Antibody Screen NEG    Sample Expiration 03/14/2019,2359    Unit Number [id number]    Blood Component Type RBC LR PHER1    Unit division 00    Status of Unit ISSUED,FINAL    Transfusion Status OK TO TRANSFUSE    Crossmatch Result Compatible    Unit Number [id number]    Blood Component Type RBC LR PHER2    Unit division 00    Status of Unit ISSUED,FINAL    Transfusion Status OK TO TRANSFUSE    Crossmatch Result      Compatible Performed at [facility], [place] [address], [place], [place] [address]   Prepare RBC     Status: None   Collection Time: 03/11/19 11:00 PM  Result Value Ref Range   Order Confirmation      ORDER PROCESSED BY BLOOD  BANK Performed at [facility], [place] [address] [place], [place] [address]    Protime-INR     Status: None   Collection Time: 03/11/19 11:00 PM  Result Value Ref Range   Prothrombin Time 13.5 11.4 - 15.2 seconds   INR 1.0 0.8 - 1.2    Comment: (NOTE) INR goal varies based on device and disease states. Performed at [facility], [place] [address], [place], [place] [address]   SARS CORONAVIRUS 2 (TAT 6-24 HRS) Nasopharyngeal Nasopharyngeal Swab     Status: None   Collection Time: 03/11/19 11:29 PM   Specimen: Nasopharyngeal Swab  Result Value Ref Range   SARS Coronavirus 2 NEGATIVE NEGATIVE    Comment: (NOTE) SARS-CoV-2 target nucleic acids are NOT DETECTED. The SARS-CoV-2 RNA is generally detectable in upper and lower respiratory specimens during the acute phase of infection. Negative results do not preclude SARS-CoV-2 infection, do not rule out co-infections with other pathogens, and should not be used as the sole basis for treatment or other patient management decisions. Negative results must be combined with clinical observations, patient history, and epidemiological information. The expected result is Negative. Fact Sheet for Patients: SugarRoll.be Fact Sheet for Healthcare Providers: https://www.woods-mathews.com/ This test is not yet approved or cleared by the [place] FDA and  has been authorized for detection and/or diagnosis of SARS-CoV-2 by FDA under an Emergency Use Authorization (EUA). This EUA will remain  in effect (meaning this test can be used) for the duration of the COVID-19 declaration under Section 56 4(b)(1) of the Act, 21 U.S.C. section 360bbb-3(b)(1), unless the authorization is terminated or revoked sooner. Performed at [facility], [place] [address] [place], [place] [address]   HIV Antibody (routine testing w rflx)     Status: None   Collection Time: 03/12/19  2:52 AM  Result Value Ref Range   HIV Screen 4th Generation wRfx NON REACTIVE NON REACTIVE    Comment: Performed  at [facility] [address] [place], [place] [address]  Iron and TIBC     Status: Abnormal   Collection Time: 03/12/19  2:52 AM  Result Value Ref Range   Iron 7 (L) 28 - 170 ug/dL   TIBC 410 250 - 450 ug/dL   Saturation Ratios 2 (L) 10.4 - 31.8 %   UIBC 403 ug/dL    Comment: Performed at [facility], [place] [address] [place], [place] [address]  Ferritin     Status: Abnormal   Collection Time: 03/12/19  2:52 AM  Result Value Ref Range   Ferritin 2 (L) 11 - 307 ng/mL    Comment: Performed at [facility], [place] [address] [place], [place] [id number]  Basic metabolic panel     Status: Abnormal   Collection Time: 03/12/19  8:40 AM  Result Value Ref Range   Sodium 139 135 - 145 mmol/L   Potassium 3.8 3.5 - 5.1 mmol/L   Chloride 107 98 - 111 mmol/L   CO2 23 22 - 32 mmol/L   Glucose, Bld 93 70 - 99 mg/dL   BUN 6 6 - 20 mg/dL   Creatinine, Ser 0.68 0.44 - 1.00 mg/dL   Calcium 8.8 (L) 8.9 - 10.3 mg/dL   GFR calc non Af Amer >60 >60 mL/min   GFR calc Af Amer >60 >60 mL/min   Anion gap 9 5 - 15    Comment: Performed at [facility] [address],  [place], [place] [address]  CBC     Status: Abnormal   Collection Time: 03/12/19  8:40 AM  Result Value Ref Range   WBC 8.6 4.0 - 10.5 K/uL   RBC 3.62 (L) 3.87 - 5.11 MIL/uL   Hemoglobin 7.3 (L) 12.0 - 15.0 g/dL    Comment: REPEATED TO VERIFY POST TRANSFUSION SPECIMEN Reticulocyte Hemoglobin testing may be clinically indicated, consider ordering this additional test [id number]    HCT 25.2 (L) 36.0 - 46.0 %   MCV 69.6 (L) 80.0 - 100.0 fL   MCH 20.2 (L) 26.0 - 34.0 pg   MCHC 29.0 (L) 30.0 - 36.0 g/dL   RDW 23.7 (H) 11.5 - 15.5 %   Platelets 648 (H) 150 - 400 K/uL   nRBC 0.3 (H) 0.0 - 0.2 %    Comment: Performed at [facility], [place] [address], [place], [place] [address]  Reticulocytes     Status: Abnormal   Collection Time: 03/12/19  8:40 AM  Result Value Ref Range   Retic Ct Pct 1.3 0.4 - 3.1 %    RBC. 3.62 (L) 3.87 - 5.11 MIL/uL   Retic Count, Absolute 48.1 19.0 - 186.0 K/uL   Immature Retic Fract 42.6 (H) 2.3 - 15.9 %    Comment: Performed at [facility] [address] [place], [place] [address]  Lipid panel     Status: Abnormal   Collection Time: 03/12/19  8:40 AM  Result Value Ref Range   Cholesterol 185 0 - 200 mg/dL   Triglycerides 89 <150 mg/dL   HDL 39 (L) >40 mg/dL   Total CHOL/HDL Ratio 4.7 RATIO   VLDL 18 0 - 40 mg/dL   LDL Cholesterol 128 (H) 0 - 99 mg/dL    Comment:        Total Cholesterol/HDL:CHD Risk Coronary Heart Disease Risk Table                     Men   Women  1/2 Average Risk   3.4   3.3  Average Risk       5.0   4.4  2 X Average Risk   9.6   7.1  3 X Average Risk  23.4   11.0        Use the calculated Patient Ratio above and the CHD Risk Table to determine the patient's CHD Risk.        ATP III CLASSIFICATION (LDL):  <100     mg/dL   Optimal  100-129  mg/dL   Near or Above                    Optimal  130-159  mg/dL   Borderline  160-189  mg/dL   High  >190     mg/dL   Very High Performed at [facility] [address] [place], [place] [address]   CBC     Status: Abnormal   Collection Time: 03/13/19  6:59 AM  Result Value Ref Range   WBC 9.3 4.0 - 10.5 K/uL   RBC 3.67 (L) 3.87 - 5.11 MIL/uL   Hemoglobin 7.5 (L) 12.0 - 15.0 g/dL    Comment: Reticulocyte Hemoglobin testing may be clinically indicated, consider ordering this additional test [id number]    HCT 25.2 (L) 36.0 - 46.0 %   MCV 68.7 (L) 80.0 - 100.0 fL   MCH 20.4 (L) 26.0 - 34.0 pg   MCHC 29.8 (L) 30.0 -  36.0 g/dL   RDW 23.3 (H) 11.5 - 15.5 %   Platelets 613 (H) 150 - 400 K/uL   nRBC 0.5 (H) 0.0 - 0.2 %    Comment: Performed at [facility] [address] [place], [place] [address]    Dg Chest 2 View  Result Date: 03/11/2019 CLINICAL DATA:  Central chest pain and tachycardia 4 days. EXAM: CHEST - 2 VIEW COMPARISON:  03/20/2017 FINDINGS: Lungs are  adequately inflated and otherwise clear. Cardiomediastinal silhouette and remainder of the exam is unchanged. IMPRESSION: No active cardiopulmonary disease. Electronically Signed   By: Marin Olp M.D.   On: 03/11/2019 17:23   US Pelvic Complete With Transvaginal  Result Date: 03/12/2019 CLINICAL DATA:  Fibroids with bleeding EXAM: TRANSABDOMINAL AND TRANSVAGINAL ULTRASOUND OF PELVIS DOPPLER ULTRASOUND OF OVARIES TECHNIQUE: Both transabdominal and transvaginal ultrasound examinations of the pelvis were performed. Transabdominal technique was performed for global imaging of the pelvis including uterus, ovaries, adnexal regions, and pelvic cul-de-sac. It was necessary to proceed with endovaginal exam following the transabdominal exam to visualize the uterus endometrium ovaries. Color and duplex Doppler ultrasound was utilized to evaluate blood flow to the ovaries. COMPARISON:  Pelvic ultrasound 08/25/2015 FINDINGS: Uterus Measurements: 11.7 x 7.7 x 9 cm = volume: 430.3 mL. Multiple hypoechoic masses consistent with fibroids. Lower posterior hypoechoic fibroid measuring 2.7 x 2.7 cm. Smaller adjacent posterior myometrial fibroid measuring 2.1 x 1.4 cm. Anterior uterine wall fibroid with calcification measuring 2.2 x 1.9 cm. Small anterior fundal fibroid measuring 1.9 x 1.5 cm. Additional anterior wall fibroid measuring 2.4 x 1.9 cm. Endometrium Thickness: 2.4 cm.  Measurement taken at the cervix. Right ovary Not seen Left ovary Measurements: 3.9 x 2.6 x 2.9 cm = volume: 15.2 mL. Normal appearance/no adnexal mass. Pulsed Doppler evaluation of both ovaries demonstrates normal low-resistance arterial and venous waveforms. Other findings No abnormal free fluid. IMPRESSION: 1. Enlarged uterus with multiple fibroids measuring up to 2.7 cm. Transvaginal images were limited by the presence of multiple fibroids. 2. Poorly visualized endometrial stripe. Abnormal endometrial thickening versus mass within the cervix  measuring up to 2.4 cm. 3. Nonvisualized right ovary.  Negative for left ovarian torsion Electronically Signed   By: Donavan Foil M.D.   On: 03/12/2019 02:29    Assessment/Plan: Heavy vaginal bleeding, thickened endometrium, and severe anemia. I discussed doing a d&c today for diagnosis and treatment. She is amenable to this. I spoke with Dr. Gifford Shave of anesthesiology who will provide her anesthesia.  Emily Filbert 03/13/2019

## 2019-03-13 NOTE — Transfer of Care (Signed)
Immediate Anesthesia Transfer of Care Note  Patient: Abigail Garcia  Procedure(s) Performed: DILATATION AND CURETTAGE (N/A )  Patient Location: PACU  Anesthesia Type:General  Level of Consciousness: patient cooperative and responds to stimulation  Airway & Oxygen Therapy: Patient Spontanous Breathing and Patient connected to nasal cannula oxygen  Post-op Assessment: Report given to RN and Post -op Vital signs reviewed and stable  Post vital signs: Reviewed and stable  Last Vitals:  Vitals Value Taken Time  BP 123/85 03/13/19 1427  Temp    Pulse 103 03/13/19 1430  Resp 20 03/13/19 1430  SpO2 90 % 03/13/19 1430  Vitals shown include unvalidated device data.  Last Pain:  Vitals:   03/13/19 1425  TempSrc:   PainSc: (P) Asleep      Patients Stated Pain Goal: 0 ([id number])  Complications: No apparent anesthesia complications

## 2019-03-14 ENCOUNTER — Encounter (HOSPITAL_COMMUNITY): Payer: Self-pay | Admitting: Obstetrics & Gynecology

## 2019-03-14 DIAGNOSIS — N920 Excessive and frequent menstruation with regular cycle: Secondary | ICD-10-CM

## 2019-03-14 DIAGNOSIS — Z9114 Patient's other noncompliance with medication regimen: Secondary | ICD-10-CM

## 2019-03-14 LAB — CBC
HCT: 26 % — ABNORMAL LOW (ref 36.0–46.0)
Hemoglobin: 7.7 g/dL — ABNORMAL LOW (ref 12.0–15.0)
MCH: 20.6 pg — ABNORMAL LOW (ref 26.0–34.0)
MCHC: 29.6 g/dL — ABNORMAL LOW (ref 30.0–36.0)
MCV: 69.7 fL — ABNORMAL LOW (ref 80.0–100.0)
Platelets: 695 10*3/uL — ABNORMAL HIGH (ref 150–400)
RBC: 3.73 MIL/uL — ABNORMAL LOW (ref 3.87–5.11)
RDW: 24.7 % — ABNORMAL HIGH (ref 11.5–15.5)
WBC: 17.9 10*3/uL — ABNORMAL HIGH (ref 4.0–10.5)
nRBC: 1 % — ABNORMAL HIGH (ref 0.0–0.2)

## 2019-03-14 LAB — BASIC METABOLIC PANEL
Anion gap: 10 (ref 5–15)
BUN: 9 mg/dL (ref 6–20)
CO2: 19 mmol/L — ABNORMAL LOW (ref 22–32)
Calcium: 9.3 mg/dL (ref 8.9–10.3)
Chloride: 106 mmol/L (ref 98–111)
Creatinine, Ser: 0.78 mg/dL (ref 0.44–1.00)
GFR calc Af Amer: >60 mL/min (ref >60)
GFR calc non Af Amer: >60 mL/min (ref >60)
Glucose, Bld: 114 mg/dL — ABNORMAL HIGH (ref 70–99)
Potassium: 3.8 mmol/L (ref 3.5–5.1)
Sodium: 135 mmol/L (ref 135–145)

## 2019-03-14 MED ORDER — METOPROLOL TARTRATE 25 MG PO TABS: 12.5000 mg | ORAL_TABLET | Freq: Two times a day (BID) | ORAL | 1 refills | Status: DC

## 2019-03-14 MED ORDER — MEGESTROL ACETATE 40 MG PO TABS: 40.0000 mg | ORAL_TABLET | Freq: Two times a day (BID) | ORAL | 0 refills | Status: DC

## 2019-03-14 MED ORDER — METOPROLOL TARTRATE 25 MG PO TABS: 12.5000 mg | ORAL_TABLET | Freq: Two times a day (BID) | ORAL | 0 refills | Status: DC

## 2019-03-14 MED ORDER — ASPIRIN 81 MG PO TBEC: 81.0000 mg | DELAYED_RELEASE_TABLET | Freq: Every day | ORAL | 0 refills | Status: AC

## 2019-03-14 NOTE — Discharge Summary (Signed)
Name: Abigail Garcia MRN: [id number] DOB: 1971/04/01 48 y.o. PCP: Marianna Payment, MD  Date of Admission: 03/11/2019  4:15 PM Date of Discharge: 10/11/202010/11 Attending Physician: Dr. Philipp Ovens Discharge Diagnosis: 1. Active Problems:   Symptomatic anemia  Discharge Medications: Allergies as of 03/14/2019   No Known Allergies     Medication List    TAKE these medications   aspirin 81 MG EC tablet Take 1 tablet (81 mg total) by mouth daily.   iron polysaccharides 150 MG capsule Commonly known as: NIFEREX Take 1 capsule (150 mg total) by mouth 2 (two) times daily.   megestrol 40 MG tablet Commonly known as: MEGACE Take 1 tablet (40 mg total) by mouth 2 (two) times daily.   metoprolol tartrate 25 MG tablet Commonly known as: LOPRESSOR Take 0.5 tablets (12.5 mg total) by mouth 2 (two) times daily.       Disposition and follow-up:   [name] was discharged from [facility] in [facility] condition.  At the hospital follow up visit please address:  1.  Make sure she follows up with ob/gyn (Dr. Hulan Fray)  2- Make sure she establish care with rheumatologist  3- Make sure she takes her cardiovascular protective agents for her CAD   Labs / imaging needed at time of follow-up: CBC    Pending labs/ test needing follow-up: Pathology result  Follow-up Appointments: Follow-up Information    Emily Filbert, MD Follow up.   Specialty: Obstetrics and Gynecology Why: Message sent to office to schedule follow in 2-3 weeks Contact information: [address] [place] [address] [phone]           Hospital Course by problem list: 1. Symptomatic anemia 2/2vaginal bleeding due to fibroid uterous:  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 She has had menorrhagia and heavy menstrual bleeding for the past 3 weeks.   On admission, she was found to have a hemoglobin of 5.8 and was transfused 2 units of PRBCs as well as IV iron infusions. Her symptoms improved with transfusion. She still had some ongoing vaginal bleeding. OB GYN consulted and performrd D&C. A large amount of polypoid-type tissue was obtained and sample sent for pathology. Patient was discharge after that with stable Hb and no major bleeding. She was prescribed with Megace per ob gyn and will follow up out patient. (Hx of CAD was discussed with OB GYN and still recommended that, considering risk and benefit)   2-RA: She has joint pain (her hands and feet) and erosive disease on plain films of the hands. RA -, CCP +. She has been prescribed DMARDs in the past, but she is not taking that. Only takes OTC NSAID PRN. She has had difficulty obtaining rheumatology care when she had Medicaid. She would definitely benefit from a DMARD and so we will refer her now that she has insurance.   3- CAD, s/p PTCA for non STEMI.  She was not on Lipitor (stopped it due to vomiting). Also took her metoprolol only intermittently. We resumed metoprolol, ,aspirin. Recommended to try Crestor instead of lipitor.   Discharge Vitals:   BP 122/70 (BP Location: Left Arm)   Pulse 87   Temp 98.4 F (36.9 C) (Oral)   Resp 20   Ht 5\' 3"  (1.6 m)   Wt 115.5 kg   LMP  02/25/2019   SpO2 98%   BMI 45.11 kg/m   Pertinent Labs, Studies, and Procedures:  Lipid Panel     Component Value Date/Time   CHOL 185 03/12/2019 0840   CHOL 162 06/18/2017 1105   TRIG 89 03/12/2019 0840   HDL 39 (L) 03/12/2019 0840   HDL 44 06/18/2017 1105   CHOLHDL 4.7 03/12/2019 0840   VLDL 18 03/12/2019 0840   LDLCALC 128 (H) 03/12/2019 0840   LDLCALC 97 06/18/2017 1105   LABVLDL 21 06/18/2017 1105   CBC Latest Ref Rng & Units 03/22/2019 03/14/2019 03/13/2019  WBC 3.4 - 10.8 x10E3/uL 9.0 17.9(H) 11.7(H)  Hemoglobin 11.1 - 15.9 g/dL 9.1(L) 7.7(L) 8.3(L)  Hematocrit 34.0 - 46.6 % 32.3(L)  26.0(L) 28.5(L)  Platelets 150 - 450 x10E3/uL 374 695(H) 708(H)     Discharge Instructions: Discharge Instructions    Call MD for:  difficulty breathing, headache or visual disturbances   Complete by: As directed    Call MD for:  extreme fatigue   Complete by: As directed    Call MD for:  persistant dizziness or light-headedness   Complete by: As directed    Call MD for:  persistant nausea and vomiting   Complete by: As directed    Call MD for:  redness, tenderness, or signs of infection (pain, swelling, redness, odor or green/yellow discharge around incision site)   Complete by: As directed    Call MD for:  severe uncontrolled pain   Complete by: As directed    Call MD for:  temperature >100.4   Complete by: As directed    Diet - low sodium heart healthy   Complete by: As directed    Discharge instructions   Complete by: As directed    Please follow up at internal medicine center with your PCP within 7 days to repeat blood work and with ob/gyn physician as recommended. Some one from the offices will contact you for the appointment. Take Metoprolol and Megace as instructed. Hold aspirin for 5 days and then start if no bleeding.  Follow up with your rheumatologist and cardiologist as we discussed. Please call us at [phone] for any questions or concerns. If further sever bleeding, dizziness, shortness of breath or chest pain, please go to emergency room.  Thanks   Increase activity slowly   Complete by: As directed    Increase activity slowly   Complete by: As directed       Signed: Dewayne Hatch, MD 03/23/2019, 4:51 PM   Pager: [phone]

## 2019-03-14 NOTE — Progress Notes (Signed)
Patient discharged home.  She left department ambulatory with myself escorting her. She was offered a wheelchair ride and she refused. She denied complaint and was in stable condition upon leaving.

## 2019-03-14 NOTE — Progress Notes (Addendum)
   Subjective: .Patient was seen and evaluated at bedside on morning rounds. No acute events overnight. Tolerated D&C procedure yesterday. Small bleeding. She is willing to eat. We discussed her Hb number today, after DC plan and resuming his Metoprolol. She agrees to f/u with cardiology and rheum and Ob/GYN after DC.    Objective:  Vital signs in last 24 hours: Vitals:   03/13/19 1515 03/13/19 1808 03/13/19 2004 03/14/19 0615  BP: (!) 129/91 138/85 120/76 115/63  Pulse: 84 88 99 79  Resp: 19 17 18 16   Temp: 97.9 F (36.6 C) 97.8 F (36.6 C) 98.1 F (36.7 C) 98.4 F (36.9 C)  TempSrc: Oral Oral Oral Oral  SpO2: 92% 96% 96% 95%  Weight:      Height:       Physical Exam Cardiovascular:     Rate and Rhythm: Normal rate and regular rhythm.  Pulmonary:     Effort: Pulmonary effort is normal.     Breath sounds: Normal breath sounds.  Abdominal:     General: Bowel sounds are normal.     Palpations: Abdomen is soft.     Tenderness: There is no abdominal tenderness.  Neurological:     Mental Status: She is alert and oriented to person, place, and time.     Assessment/Plan:  Active Problems:   Symptomatic anemia  Symptomatic anemia 2/2 menorrhagia likely 2/2 fibroid uterous: S/P 2 u pRBC and Feraheme S/P D&C yesterday a large amount of polypoid-type  tissue was obtained -HD stable -bleeding improved -Hb post procedure yesterday 8.3 -Today Hb pending -->7.7 -Surgical path pending -Appreciate Ob/gyn f/u for DC plan -Megace per Ob/gyn (aware of Hx of CAD)  Hx of RA: No medication at home except NSAID -Will need rheumatologist referral and out patient f/u  CAD  Non adherent to home Metoprolol and ASA -Resumed Metoprolol 12.5 BID -Crestor 20 mg QD  Dispo: Anticipated discharge 0-1 day  Dewayne Hatch, MD 03/14/2019, 7:12 AM Pager: [phone]

## 2019-03-14 NOTE — Progress Notes (Signed)
Patient will continue Megace after discharge and be followed up in the office in 2-3 weeks   Verita Schneiders, MD, [place], [place] for [employer], [place]

## 2019-03-14 NOTE — Progress Notes (Signed)
Pt is inquiring to go home today. RN paged Pt's MD

## 2019-03-16 ENCOUNTER — Telehealth (INDEPENDENT_AMBULATORY_CARE_PROVIDER_SITE_OTHER): Payer: 59 | Admitting: Lactation Services

## 2019-03-16 DIAGNOSIS — N938 Other specified abnormal uterine and vaginal bleeding: Secondary | ICD-10-CM

## 2019-03-16 LAB — SURGICAL PATHOLOGY

## 2019-03-16 NOTE — Telephone Encounter (Signed)
Called pt to inform her of Pathology results were normal and that she needs follow up with Dr. Hulan Fray to determine POC. Will send a message to front office staff to call and schedule pt to follow up with Dr. Hulan Fray. Pt voiced understanding.   Ot has paperwork from her job that needs to be completed. Gave pt to fax # to send paperwork for front office to complete and pt will be notified when paperwork is completed. Pt voiced understanding.

## 2019-03-16 NOTE — Telephone Encounter (Signed)
-----   Message from Osborne Oman, MD sent at 03/16/2019 10:13 AM EDT ----- Benign pathology results.  Please call to inform patient of results and recommendations.  She will follow uo in the office for further management, needs follow up with Dr. Hulan Fray.

## 2019-03-22 ENCOUNTER — Other Ambulatory Visit: Payer: Self-pay

## 2019-03-22 ENCOUNTER — Ambulatory Visit (INDEPENDENT_AMBULATORY_CARE_PROVIDER_SITE_OTHER): Payer: 59 | Admitting: Internal Medicine

## 2019-03-22 VITALS — BP 115/84 | HR 82 | Temp 98.0°F | Ht 63.0 in | Wt 253.1 lb

## 2019-03-22 DIAGNOSIS — M06041 Rheumatoid arthritis without rheumatoid factor, right hand: Secondary | ICD-10-CM

## 2019-03-22 DIAGNOSIS — M069 Rheumatoid arthritis, unspecified: Secondary | ICD-10-CM

## 2019-03-22 DIAGNOSIS — D649 Anemia, unspecified: Secondary | ICD-10-CM

## 2019-03-22 DIAGNOSIS — I2510 Atherosclerotic heart disease of native coronary artery without angina pectoris: Secondary | ICD-10-CM

## 2019-03-22 DIAGNOSIS — D259 Leiomyoma of uterus, unspecified: Secondary | ICD-10-CM

## 2019-03-22 DIAGNOSIS — Z791 Long term (current) use of non-steroidal anti-inflammatories (NSAID): Secondary | ICD-10-CM

## 2019-03-22 DIAGNOSIS — R7303 Prediabetes: Secondary | ICD-10-CM

## 2019-03-22 DIAGNOSIS — N920 Excessive and frequent menstruation with regular cycle: Secondary | ICD-10-CM

## 2019-03-22 DIAGNOSIS — M06042 Rheumatoid arthritis without rheumatoid factor, left hand: Secondary | ICD-10-CM

## 2019-03-22 DIAGNOSIS — R7309 Other abnormal glucose: Secondary | ICD-10-CM

## 2019-03-22 DIAGNOSIS — E785 Hyperlipidemia, unspecified: Secondary | ICD-10-CM

## 2019-03-22 DIAGNOSIS — Z79899 Other long term (current) drug therapy: Secondary | ICD-10-CM

## 2019-03-22 DIAGNOSIS — N924 Excessive bleeding in the premenopausal period: Secondary | ICD-10-CM

## 2019-03-22 MED ORDER — ROSUVASTATIN CALCIUM 20 MG PO TABS: 20.0000 mg | ORAL_TABLET | Freq: Every day | ORAL | 3 refills | Status: DC

## 2019-03-22 MED ORDER — NAPROXEN SODIUM 220 MG PO TABS: 440.0000 mg | ORAL_TABLET | Freq: Two times a day (BID) | ORAL | 0 refills | Status: DC | PRN

## 2019-03-22 NOTE — Progress Notes (Deleted)
   CC: ***  HPI:  [name] is a 48 y.o.   Past Medical History:  Diagnosis Date  . Anemia   . CAD (coronary artery disease)    a. 11/2012 NSTEMI/Cath/PCI: LM nl, LAD 80-90 thrombotic (tx with Heparin x 2 days then aspiration thrombectomy and PTCA), RI nl, LCX sm/nl, RCA nl, EF 55-65%.  . Esophagitis    grade 1  . Hx of echocardiogram    a. Echo (6/14) with EF 55-60%.   . Menorrhagia   . NSTEMI (non-ST elevated myocardial infarction) ([place])    11/2012   . Obesity   . RA (rheumatoid arthritis) (HCC)    Review of Systems:  ***  Physical Exam:  Vitals:   03/22/19 1455  BP: 115/84  Pulse: 82  Temp: 98 F (36.7 C)  TempSrc: Oral  SpO2: 100%  Weight: 253 lb 1.6 oz (114.8 kg)  Height: 5\' 3"  (1.6 m)   ***  Assessment & Plan:   See Encounters Tab for problem based charting.  Patient {GC/GE:3044014::"discussed with","seen with"} Dr. {NAMES:[id number]::"Butcher","Guilloud","Hoffman","Mullen","Narendra","Raines","Vincent"}

## 2019-03-22 NOTE — Patient Instructions (Addendum)
Thank you for allowing [place] to provide your care. Today we are checking some blood work. I will call you if anything needs change. Please follow-up with OB/GYN. Continue to take care iron supplementation either once daily or every other day with a cup of orange juice.  I have referred you to a rheumatologist. In the meantime you can take Naproxen for your pain as needed. Please come back to see [place] in six months or sooner if any issues arise.

## 2019-03-22 NOTE — Progress Notes (Signed)
   CC: Anemia, rheumatoid arthritis, hyperlipidemia, prediabetes  HPI:  Abigail Garcia is a 48 y.o. female with PMHx listed below presenting for Anemia, rheumatoid arthritis, hyperlipidemia, prediabetes. Please see the A&P for the status of the patient's chronic medical problems.  Past Medical History:  Diagnosis Date  . Anemia   . CAD (coronary artery disease)    a. 11/2012 NSTEMI/Cath/PCI: LM nl, LAD 80-90 thrombotic (tx with Heparin x 2 days then aspiration thrombectomy and PTCA), RI nl, LCX sm/nl, RCA nl, EF 55-65%.  . Esophagitis    grade 1  . Hx of echocardiogram    a. Echo (6/14) with EF 55-60%.   . Menorrhagia   . NSTEMI (non-ST elevated myocardial infarction) ([place])    11/2012   . Obesity   . RA (rheumatoid arthritis) ([place])    Review of Systems:  Performed and all others negative.  Physical Exam: Vitals:   03/22/19 1455  BP: 115/84  Pulse: 82  Temp: 98 F (36.7 C)  TempSrc: Oral  SpO2: 100%  Weight: 253 lb 1.6 oz (114.8 kg)  Height: 5\' 3"  (1.6 m)   General: Well nourished female in no acute distress Pulm: Good air movement with no wheezing or crackles  CV: RRR, no murmurs, no rubs   Assessment & Plan:   See Encounters Tab for problem based charting.  Patient discussed with Dr. Heber [place]

## 2019-03-23 ENCOUNTER — Telehealth: Payer: Self-pay | Admitting: Internal Medicine

## 2019-03-23 LAB — CBC
Hematocrit: 32.3 % — ABNORMAL LOW (ref 34.0–46.6)
Hemoglobin: 9.1 g/dL — ABNORMAL LOW (ref 11.1–15.9)
MCH: 21 pg — ABNORMAL LOW (ref 26.6–33.0)
MCHC: 28.2 g/dL — ABNORMAL LOW (ref 31.5–35.7)
MCV: 74 fL — ABNORMAL LOW (ref 79–97)
Platelets: 374 10*3/uL (ref 150–450)
RBC: 4.34 x10E6/uL (ref 3.77–5.28)
RDW: 26.7 % — ABNORMAL HIGH (ref 11.7–15.4)
WBC: 9 10*3/uL (ref 3.4–10.8)

## 2019-03-23 LAB — HEMOGLOBIN A1C
Est. average glucose Bld gHb Est-mCnc: 94 mg/dL
Hgb A1c MFr Bld: 4.9 % (ref 4.8–5.6)

## 2019-03-23 NOTE — Assessment & Plan Note (Signed)
Patient with known rheumatoid arthritis. She previously followed with a rheumatologist and [facility] however he retired and since that time she has been unable to establish with a rheumatologist. She does not drive and therefore it makes seeing a rheumatologist another down very difficult. She currently does not have any inflamed joints. She is just been using NSAIDs intermittently for her pain. I will put in a referral to the rheumatologist.

## 2019-03-23 NOTE — Telephone Encounter (Signed)
Called patient to discuss lab results. All questions and concerns addressed.   Ina Homes, MD  [facility] PGY3

## 2019-03-23 NOTE — Assessment & Plan Note (Signed)
Patient was recently admitted to the hospital with symptomatic anemia secondary to menorrhagia. She had known uterine fibroids; however, transvaginal ultrasound also illustrated endometrial thickening. She was taken for emergent DIC with biopsies. Pathology returned benign. She was transfused two units while in the hospital.  Since being discharged from the hospital she is continued to take megace. She is not followed up with OB/GYN yet. She has had no further bleeding. She continues to have some shortness of breath with exertion. She is not been taking her iron supplementation.  A/P: - Continue Megace - Follow-up with OB/GYN - Hgb improving  - Encouraged use of once daily or every other day iron with a glass of orange juice.

## 2019-03-23 NOTE — Assessment & Plan Note (Signed)
Patient with known hyperlipidemia and known CAD. Will continue rosuvastatin 20 mg daily. She has been off this for a couple months. We will recheck lipid panel in 3 to 6 months.

## 2019-03-23 NOTE — Assessment & Plan Note (Signed)
Patient with a history of prediabetes. A1c of 5.7 a couple years ago. Had an abnormal fasting blood sugar in the hospital. Will check a hemoglobin A1c today.

## 2019-03-23 NOTE — Assessment & Plan Note (Signed)
Please see the problem listed "menorrhagia."

## 2019-03-24 NOTE — Progress Notes (Signed)
Internal Medicine Clinic Attending  Case discussed with Dr. Helberg at the time of the visit.  We reviewed the resident's history and exam and pertinent patient test results.  I agree with the assessment, diagnosis, and plan of care documented in the resident's note.    

## 2019-04-05 ENCOUNTER — Encounter: Payer: Self-pay | Admitting: *Deleted

## 2019-04-05 ENCOUNTER — Ambulatory Visit: Payer: 59 | Admitting: Obstetrics and Gynecology

## 2019-04-19 ENCOUNTER — Ambulatory Visit: Payer: 59 | Admitting: Obstetrics & Gynecology

## 2019-04-19 ENCOUNTER — Other Ambulatory Visit: Payer: Self-pay

## 2019-04-19 ENCOUNTER — Encounter: Payer: Self-pay | Admitting: Obstetrics & Gynecology

## 2019-04-19 VITALS — BP 121/78 | HR 92 | Wt 252.4 lb

## 2019-04-19 DIAGNOSIS — Z0000 Encounter for general adult medical examination without abnormal findings: Secondary | ICD-10-CM

## 2019-04-19 DIAGNOSIS — N920 Excessive and frequent menstruation with regular cycle: Secondary | ICD-10-CM | POA: Diagnosis not present

## 2019-04-19 DIAGNOSIS — N938 Other specified abnormal uterine and vaginal bleeding: Secondary | ICD-10-CM

## 2019-04-19 DIAGNOSIS — D62 Acute posthemorrhagic anemia: Secondary | ICD-10-CM | POA: Diagnosis not present

## 2019-04-19 DIAGNOSIS — N921 Excessive and frequent menstruation with irregular cycle: Secondary | ICD-10-CM

## 2019-04-19 MED ORDER — LEUPROLIDE ACETATE (3 MONTH) 11.25 MG IM KIT
11.2500 mg | PACK | Freq: Once | INTRAMUSCULAR | Status: AC
  Administered 2019-04-19: 11.25 mg via INTRAMUSCULAR

## 2019-04-19 NOTE — Progress Notes (Signed)
   Subjective:    Patient ID: Abigail Garcia, female    DOB: 1971-05-01, 48 y.o.   MRN: [id number]  HPI  48 yo divorced P4 (25, 67, 48 yo twins) here today for f/u after d&c done last month during a hospitalization for menorrhagia along with cardiac issues. Her pathology was benign, and she was discharged home on megace. She stopped it after the bleeding stopped and now she has started a heavy period again with bad cramping. Her u/s showed several fibroids.  Review of Systems She had a BTL    Objective:   Physical Exam Breathing, conversing, and ambulating normally Well nourished, well hydrated Black female, no apparent distress     Assessment & Plan:  Menorrhagia with anemia-start depo lupron today, restart megace today, stop prn Preventative care- rec annual when not bleeding Mammogram ordered She declines a flu vaccine.

## 2019-05-13 NOTE — Progress Notes (Signed)
Virtual Visit via Video Note  I connected with Abigail Garcia on 05/17/19 at  9:45 AM EST by a video enabled telemedicine application and verified that I am speaking with the correct person using two identifiers.  Location: Patient: Home  Provider: Clinic  This service was conducted via virtual visit.  Both audio and visual tools were used.  The patient was located at home. I was located in my office.  Consent was obtained prior to the virtual visit and is aware of possible charges through their insurance for this visit.  The patient is an established patient.  Dr. Estanislado Pandy, MD conducted the virtual visit and Hazel Sams, PA-C acted as scribe during the service.  Office staff helped with scheduling follow up visits after the service was conducted.     I discussed the limitations of evaluation and management by telemedicine and the availability of in person appointments. The patient expressed understanding and agreed to proceed.  CC: History of Present Illness: Patient is a 48 year old female with past medical history rheumatoid arthritis.  According to patient she was diagnosed with rheumatoid arthritis in 1999 while she was living in [place].  She states she was having swelling in her both ankles and her wrist joints.  She was tried on multiple medications.  She recalls being on methotrexate, Arava and Enbrel.  She states she was also on prednisone.  She states all the medications were discontinued due to one of the other side effects.  She has been off and on different medications for the last 20 years.  She states she moved to [place] in 2011.  She was under care of Dr. Charlestine Night and took prednisone for few years until he retired.  She went to [facility] for 1 visit and had no follow-up there.  She states she has not had any treatment for rheumatoid arthritis for the last 4 years.  She has had pain and discomfort in her both wrist, hands, shoulders.  She also complains of discomfort in her  ankles and her feet.  She states currently she is having discomfort in her bilateral wrist joints only.  She states her wrist joints swell at times.  She has been taking Naprosyn over-the-counter which only helps her to some extent.  She states she wants something like meloxicam to control her symptoms.  She states she could not get meloxicam from her PCP.  She is hesitant to go on any immunosuppressive agents due to increased risk of infections.  She states she has had pneumonia in the past on immunosuppressive agents.  Record review:  Diagnosed with RA in 1999, RF negative and CCP positive.  2011-DrCharlestine Night started her on Prednisone  Initial consult with Dr. Annabelle Harman on 01/08/2016  used Enbrel in the early 2000s and transitioned to MTX due to insurance coverage. 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    Review of Systems  Constitutional: Positive for malaise/fatigue. Negative for fever.  Eyes: Negative for photophobia, pain, discharge and redness.       +Dry eyes  Respiratory: Negative for cough, shortness of breath and wheezing.   Cardiovascular: Positive for palpitations. Negative for chest pain.  Gastrointestinal: Negative for blood in stool, constipation and diarrhea.  Genitourinary: Negative for dysuria.  Musculoskeletal:  Positive for joint pain. Negative for back pain, myalgias and neck pain.       +Joint swelling +Morning stiffness   Skin: Negative for rash.  Neurological: Negative for dizziness and headaches.  Psychiatric/Behavioral: Negative for depression. The patient has insomnia. The patient is not nervous/anxious.       Observations/Objective: Physical Exam  Constitutional: She is oriented to person, place, and time and  well-developed, well-nourished, and in no distress.  HENT:  Head: Normocephalic and atraumatic.  Eyes: Conjunctivae are normal.  Pulmonary/Chest: Effort normal.  Neurological: She is alert and oriented to person, place, and time.  Psychiatric: Mood, memory, affect and judgment normal.   Patient reports morning stiffness for 20-30  minutes.   Patient denies nocturnal pain.  Difficulty dressing/grooming: Denies Difficulty climbing stairs: Reports Difficulty getting out of chair: Reports Difficulty using hands for taps, buttons, cutlery, and/or writing: Reports   Assessment and Plan: Diagnoses and all orders for this visit:  Rheumatoid arthritis of multiple sites with negative rheumatoid factor ([place]) According to patient she was diagnosed with rheumatoid arthritis in 1999.  She has been on intermittent treatment with methotrexate, Arava and Enbrel.  Patient states that she had side effects from all the medications including recurrent pneumonia.  She is not interested in immunosuppressive agent at this point.  She states she has been taking Mobic in the past which was helpful.  She wants to go back on Mobic.  She has been taking naproxen over-the-counter currently.  Indications side effects contraindications of Mobic were discussed.  Once her labs are available we can place her on Mobic.  Side effects include the including the risk of GI bleed, nephrotoxicity and hepatotoxicity were discussed.  According to the records by Dr. 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  Enbrel was also discontinued today insurance issues. -     ESR; Future -     RF; Future -     CCP; Future -     ANA; Future -     14-3-3 eta Protein; Future  High risk medication use- In anticipation to start on immunosuppressive agent in the future I will obtain following labs today. -     CBC with diff; Future -     CMP with GFR; Future -      Hepatitis B core antibody, IgM; Future -     Hepatitis B surface antigen; Future -     Hepatitis C antibody; Future -     HIV antibody; Future -     QuantiFERON-TB Gold Plus; Future -     Serum protein electrophoresis with reflex; Future -     Immunoglobulins; Future  Other fatigue -     CK (Creatine Kinase); Future -     TSH; Future  Symptomatic anemia-patient is anemic and I am uncertain about the etiology of her anemia.  Thrombocytosis (HCC)-most likely reactive thrombocytosis from anemia.  Other medical problems are listed as follows:  Ganglion of right wrist  Prediabetes  CAD S/P percutaneous coronary angioplasty  History of hyperlipidemia  NSTEMI (non-ST elevated myocardial infarction) [facility]) Comments: 2014  Blurry vision, bilateral  Hirsutism     Follow Up Instructions: She will follow up after the lab work is available.   I discussed the assessment and treatment plan with the patient. The patient was provided an opportunity to ask questions and all were answered. The patient agreed with the plan and demonstrated an understanding of the  instructions.   The patient was advised to call back or seek an in-person evaluation if the symptoms worsen or if the condition fails to improve as anticipated.  I provided 45 minutes of non-face-to-face time during this encounter.   Bo Merino, MD

## 2019-05-17 ENCOUNTER — Telehealth (INDEPENDENT_AMBULATORY_CARE_PROVIDER_SITE_OTHER): Payer: 59 | Admitting: Rheumatology

## 2019-05-17 ENCOUNTER — Other Ambulatory Visit: Payer: Self-pay

## 2019-05-17 ENCOUNTER — Encounter: Payer: Self-pay | Admitting: Rheumatology

## 2019-05-17 DIAGNOSIS — M0609 Rheumatoid arthritis without rheumatoid factor, multiple sites: Secondary | ICD-10-CM

## 2019-05-17 DIAGNOSIS — I2510 Atherosclerotic heart disease of native coronary artery without angina pectoris: Secondary | ICD-10-CM

## 2019-05-17 DIAGNOSIS — I214 Non-ST elevation (NSTEMI) myocardial infarction: Secondary | ICD-10-CM

## 2019-05-17 DIAGNOSIS — Z8639 Personal history of other endocrine, nutritional and metabolic disease: Secondary | ICD-10-CM

## 2019-05-17 DIAGNOSIS — M67431 Ganglion, right wrist: Secondary | ICD-10-CM

## 2019-05-17 DIAGNOSIS — D75839 Thrombocytosis, unspecified: Secondary | ICD-10-CM

## 2019-05-17 DIAGNOSIS — R5383 Other fatigue: Secondary | ICD-10-CM | POA: Diagnosis not present

## 2019-05-17 DIAGNOSIS — Z79899 Other long term (current) drug therapy: Secondary | ICD-10-CM

## 2019-05-17 DIAGNOSIS — M06041 Rheumatoid arthritis without rheumatoid factor, right hand: Secondary | ICD-10-CM

## 2019-05-17 DIAGNOSIS — R7303 Prediabetes: Secondary | ICD-10-CM

## 2019-05-17 DIAGNOSIS — D473 Essential (hemorrhagic) thrombocythemia: Secondary | ICD-10-CM

## 2019-05-17 DIAGNOSIS — H538 Other visual disturbances: Secondary | ICD-10-CM

## 2019-05-17 DIAGNOSIS — Z9861 Coronary angioplasty status: Secondary | ICD-10-CM

## 2019-05-17 DIAGNOSIS — L680 Hirsutism: Secondary | ICD-10-CM

## 2019-05-17 DIAGNOSIS — D649 Anemia, unspecified: Secondary | ICD-10-CM

## 2019-06-22 NOTE — Progress Notes (Deleted)
Office Visit Note  Patient: Abigail Garcia             Date of Birth: Aug 18, 1970           MRN: [id number]             PCP: Marianna Payment, MD Referring: Marianna Payment, MD Visit Date: 06/28/2019 Occupation: @GUAROCC @  Subjective:  No chief complaint on file.   History of Present Illness: Abigail Garcia is a 49 y.o. female ***   Activities of Daily Living:  Patient reports morning stiffness for *** {minute/hour:19697}.   Patient {ACTIONS;DENIES/REPORTS:21021675::"Denies"} nocturnal pain.  Difficulty dressing/grooming: {ACTIONS;DENIES/REPORTS:21021675::"Denies"} Difficulty climbing stairs: {ACTIONS;DENIES/REPORTS:21021675::"Denies"} Difficulty getting out of chair: {ACTIONS;DENIES/REPORTS:21021675::"Denies"} Difficulty using hands for taps, buttons, cutlery, and/or writing: {ACTIONS;DENIES/REPORTS:21021675::"Denies"}  No Rheumatology ROS completed.   PMFS History:  Patient Active Problem List   Diagnosis Date Noted  . Symptomatic anemia 03/12/2019  . Hirsutism 04/17/2017  . Prediabetes 08/18/2015  . Blurry vision, bilateral 08/18/2015  . DUB (dysfunctional uterine bleeding) 08/11/2015  . Obesity 11/11/2013  . Thrombocytosis ([place]) 08/31/2013  . Ganglion cyst of wrist 06/12/2013  . Health care maintenance 06/12/2013  . Menorrhagia 12/28/2012  . Hyperlipidemia 12/03/2012  . CAD S/P percutaneous coronary angioplasty 11/30/2012  . Rheumatoid arthritis ([place]) 11/14/2012  . Anemia, iron deficiency 11/14/2012    Past Medical History:  Diagnosis Date  . Anemia   . CAD (coronary artery disease)    a. 11/2012 NSTEMI/Cath/PCI: LM nl, LAD 80-90 thrombotic (tx with Heparin x 2 days then aspiration thrombectomy and PTCA), RI nl, LCX sm/nl, RCA nl, EF 55-65%.  . Esophagitis    grade 1  . Hx of echocardiogram    a. Echo (6/14) with EF 55-60%.   . Menorrhagia   . NSTEMI (non-ST elevated myocardial infarction) ([place])    11/2012   . Obesity   . RA (rheumatoid arthritis) (HCC)     Family  History  Problem Relation Age of Onset  . Heart disease Other   . Breast cancer Paternal Grandmother    Past Surgical History:  Procedure Laterality Date  . bilateral tubal    . CARDIAC CATHETERIZATION  2014  . CESAREAN SECTION    . CORONARY ANGIOGRAM  11/19/2012   Procedure: CORONARY ANGIOGRAM;  Surgeon: Peter M Martinique, MD;  Location: [facility] CATH LAB;  Service: Cardiovascular;;  . DILATION AND CURETTAGE OF UTERUS    . DILATION AND CURETTAGE OF UTERUS N/A 03/13/2019   Procedure: DILATATION AND CURETTAGE;  Surgeon: Emily Filbert, MD;  Location: [place];  Service: Gynecology;  Laterality: N/A;  . INTRAVASCULAR ULTRASOUND  11/19/2012   Procedure: INTRAVASCULAR ULTRASOUND;  Surgeon: Peter M Martinique, MD;  Location: [facility] CATH LAB;  Service: Cardiovascular;;  . LEFT HEART CATHETERIZATION WITH CORONARY ANGIOGRAM N/A 11/16/2012   Procedure: LEFT HEART CATHETERIZATION WITH CORONARY ANGIOGRAM;  Surgeon: Peter M Martinique, MD;  Location: [facility] CATH LAB;  Service: Cardiovascular;  Laterality: N/A;  . PERCUTANEOUS CORONARY INTERVENTION-BALLOON ONLY  11/19/2012   Procedure: PERCUTANEOUS CORONARY INTERVENTION-BALLOON ONLY;  Surgeon: Peter M Martinique, MD;  Location[facility] C[facility] LAB;  Service: Cardiovascular;;   Social History   Social History Narrative  . Not on file   Immunization History  Administered Date(s) Administered  . Pneumococcal-Unspecified 01/02/2016  . Tdap 12/20/2014     Objective: Vital Signs: There were no vitals taken for this visit.   Physical Exam   Musculoskeletal Exam: ***  CDAI Exam: CDAI Score: -- Patient Global: --; Provider Global: -- Swollen: --; Tender: -- Joint  Exam 06/28/2019   No joint exam has been documented for this visit   There is currently no information documented on the homunculus. Go to the Rheumatology activity and complete the homunculus joint exam.  Investigation: No additional findings.  Imaging: No results found.  Recent Labs: Lab Results  Component Value  Date   WBC 9.0 03/22/2019   HGB 9.1 (L) 03/22/2019   PLT 374 03/22/2019   NA 135 03/14/2019   K 3.8 03/14/2019   CL 106 03/14/2019   CO2 19 (L) 03/14/2019   GLUCOSE 114 (H) 03/14/2019   BUN 9 03/14/2019   CREATININE 0.78 03/14/2019   BILITOT 0.3 06/18/2017   ALKPHOS 76 06/18/2017   AST 10 06/18/2017   ALT 10 06/18/2017   PROT 7.2 06/18/2017   ALBUMIN 3.8 06/18/2017   CALCIUM 9.3 03/14/2019   GFRAA >60 03/14/2019    Speciality Comments: No specialty comments available.  Procedures:  No procedures performed Allergies: Patient has no known allergies.   Assessment / Plan:     Visit Diagnoses: No diagnosis found.  Orders: No orders of the defined types were placed in this encounter.  No orders of the defined types were placed in this encounter.   Face-to-face time spent with patient was *** minutes. Greater than 50% of time was spent in counseling and coordination of care.  Follow-Up Instructions: No follow-ups on file.   Bo Merino, MD  Note - This record has been created using Editor, commissioning.  Chart creation errors have been sought, but may not always  have been located. Such creation errors do not reflect on  the standard of medical care.

## 2019-06-28 ENCOUNTER — Ambulatory Visit: Payer: 59 | Admitting: Rheumatology

## 2019-07-21 ENCOUNTER — Encounter: Payer: Self-pay | Admitting: Obstetrics & Gynecology

## 2019-07-21 ENCOUNTER — Ambulatory Visit (INDEPENDENT_AMBULATORY_CARE_PROVIDER_SITE_OTHER): Payer: No Typology Code available for payment source | Admitting: Obstetrics & Gynecology

## 2019-07-21 ENCOUNTER — Other Ambulatory Visit: Payer: Self-pay

## 2019-07-21 VITALS — BP 118/83 | HR 87 | Ht 63.0 in | Wt 257.2 lb

## 2019-07-21 DIAGNOSIS — Z124 Encounter for screening for malignant neoplasm of cervix: Secondary | ICD-10-CM | POA: Diagnosis not present

## 2019-07-21 DIAGNOSIS — Z113 Encounter for screening for infections with a predominantly sexual mode of transmission: Secondary | ICD-10-CM | POA: Diagnosis not present

## 2019-07-21 DIAGNOSIS — Z01419 Encounter for gynecological examination (general) (routine) without abnormal findings: Secondary | ICD-10-CM | POA: Diagnosis not present

## 2019-07-21 DIAGNOSIS — N921 Excessive and frequent menstruation with irregular cycle: Secondary | ICD-10-CM

## 2019-07-21 DIAGNOSIS — Z1151 Encounter for screening for human papillomavirus (HPV): Secondary | ICD-10-CM | POA: Diagnosis not present

## 2019-07-21 DIAGNOSIS — Z1231 Encounter for screening mammogram for malignant neoplasm of breast: Secondary | ICD-10-CM | POA: Diagnosis not present

## 2019-07-21 MED ORDER — LEUPROLIDE ACETATE (3 MONTH) 11.25 MG IM KIT
11.2500 mg | PACK | INTRAMUSCULAR | Status: DC
  Administered 2019-07-21 – 2020-06-09 (×2): 11.25 mg via INTRAMUSCULAR

## 2019-07-21 NOTE — Patient Instructions (Signed)

## 2019-07-21 NOTE — Progress Notes (Signed)
GYNECOLOGY ANNUAL PREVENTATIVE CARE ENCOUNTER NOTE  History:     Abigail Garcia is a 49 y.o. [phone] female here for a routine annual gynecologic exam and for her second Lupron injection used for management of her menorrhagia.  Current complaints: none.  No bleeding since initial Lupron injection three months ago. Denies abnormal vaginal bleeding, discharge, pelvic pain, problems with intercourse or other gynecologic concerns.    Gynecologic History No LMP recorded. Contraception: tubal ligation Last Pap: 2017. Results were: normal with negative HPV Last mammogram: unknown. Results were: normal  Obstetric History OB History  Gravida Para Term Preterm AB Living  7 3 2 1 4 4   SAB TAB Ectopic Multiple Live Births  4 0 0 1 2    # Outcome Date GA Lbr Len/2nd Weight Sex Delivery Anes PTL Lv  7A Preterm 2005 [redacted]w[redacted]d    CS-Unspec        Birth Comments: lost 80 lbs during pregnancy  7B Preterm      CS-Unspec     6 SAB 2004          5 SAB 2004          4 SAB 2004             Birth Comments: had D&C  3 SAB 2004          2 Term 1997 [redacted]w[redacted]d  7 lb (3.175 kg) F    LIV     Birth Comments: no complications  1 Term [id number] [redacted]w[redacted]d    Vag-Spont   LIV     Birth Comments: no complications    Past Medical History:  Diagnosis Date  . Anemia   . CAD (coronary artery disease)    a. 11/2012 NSTEMI/Cath/PCI: LM nl, LAD 80-90 thrombotic (tx with Heparin x 2 days then aspiration thrombectomy and PTCA), RI nl, LCX sm/nl, RCA nl, EF 55-65%.  . Esophagitis    grade 1  . Hx of echocardiogram    a. Echo (6/14) with EF 55-60%.   . Menorrhagia   . NSTEMI (non-ST elevated myocardial infarction) ([place])    11/2012   . Obesity   . RA (rheumatoid arthritis) ([place])     Past Surgical History:  Procedure Laterality Date  . bilateral tubal    . CARDIAC CATHETERIZATION  2014  . CESAREAN SECTION    . CORONARY ANGIOGRAM  11/19/2012   Procedure: CORONARY ANGIOGRAM;  Surgeon: Peter M Martinique, MD;  Location: [facility] CATH LAB;   Service: Cardiovascular;;  . DILATION AND CURETTAGE OF UTERUS    . DILATION AND CURETTAGE OF UTERUS N/A 03/13/2019   Procedure: DILATATION AND CURETTAGE;  Surgeon: Emily Filbert, MD;  Location: [place];  Service: Gynecology;  Laterality: N/A;  . INTRAVASCULAR ULTRASOUND  11/19/2012   Procedure: INTRAVASCULAR ULTRASOUND;  Surgeon: Peter M Martinique, MD;  Location: [facility] CATH LAB;  Service: Cardiovascular;;  . LEFT HEART CATHETERIZATION WITH CORONARY ANGIOGRAM N/A 11/16/2012   Procedure: LEFT HEART CATHETERIZATION WITH CORONARY ANGIOGRAM;  Surgeon: Peter M Martinique, MD;  Location: [facility] CATH LAB;  Service: Cardiovascular;  Laterality: N/A;  . PERCUTANEOUS CORONARY INTERVENTION-BALLOON ONLY  11/19/2012   Procedure: PERCUTANEOUS CORONARY INTERVENTION-BALLOON ONLY;  Surgeon: Peter M Martinique, MD;  Location: [facility] CATH LAB;  Service: Cardiovascular;;    Current Outpatient Medications on File Prior to Visit  Medication Sig Dispense Refill  . aspirin 81 MG EC tablet Take 1 tablet (81 mg total) by mouth daily. 30 tablet 0  . iron polysaccharides (NIFEREX)  150 MG capsule Take 1 capsule (150 mg total) by mouth 2 (two) times daily. 60 capsule 3  . metoprolol tartrate (LOPRESSOR) 25 MG tablet Take 0.5 tablets (12.5 mg total) by mouth 2 (two) times daily. 180 tablet 1  . naproxen sodium (ALEVE) 220 MG tablet Take 2 tablets (440 mg total) by mouth 2 (two) times daily as needed (pain). 60 tablet 0  . rosuvastatin (CRESTOR) 20 MG tablet Take 1 tablet (20 mg total) by mouth daily. 90 tablet 3   No current facility-administered medications on file prior to visit.    No Known Allergies  Social History:  reports that she has never smoked. She has never used smokeless tobacco. She reports that she does not drink alcohol or use drugs.  Family History  Problem Relation Age of Onset  . Heart disease Other   . Breast cancer Paternal Grandmother     The following portions of the patient's history were reviewed and updated as  appropriate: allergies, current medications, past family history, past medical history, past social history, past surgical history and problem list.  Review of Systems Pertinent items noted in HPI and remainder of comprehensive ROS otherwise negative.  Physical Exam:  BP 118/83   Pulse 87   Ht 5\' 3"  (1.6 m)   Wt 257 lb 3.2 oz (116.7 kg)   BMI 45.56 kg/m  CONSTITUTIONAL: Well-developed, well-nourished female in no acute distress.  HENT:  Normocephalic, atraumatic, External right and left ear normal. Oropharynx is clear and moist EYES: Conjunctivae and EOM are normal. Pupils are equal, round, and reactive to light. No scleral icterus.  NECK: Normal range of motion, supple, no masses.  Normal thyroid.  SKIN: Skin is warm and dry. No rash noted. Not diaphoretic. No erythema. No pallor. MUSCULOSKELETAL: Normal range of motion. No tenderness.  No cyanosis, clubbing, or edema.  2+ distal pulses. NEUROLOGIC: Alert and oriented to person, place, and time. Normal reflexes, muscle tone coordination.  PSYCHIATRIC: Normal mood and affect. Normal behavior. Normal judgment and thought content. CARDIOVASCULAR: Normal heart rate noted, regular rhythm RESPIRATORY: Clear to auscultation bilaterally. Effort and breath sounds normal, no problems with respiration noted. BREASTS: Symmetric in size. No masses, tenderness, skin changes, nipple drainage, or lymphadenopathy bilaterally. ABDOMEN: Soft, no distention noted.  No tenderness, rebound or guarding.  PELVIC: Normal appearing external genitalia and urethral meatus; normal appearing vaginal mucosa and cervix.  No abnormal discharge noted.  Pap smear obtained.  Normal uterine size, no other palpable masses, no uterine or adnexal tenderness.   Assessment and Plan:    1. Menorrhagia with irregular cycle Lupron given today. Talked about add back therapy with Norethindrone 5 mg po daily, but she declines this for now.  Is on Vit D and calcium and doing weight  bearing exercises to help prevent osteoporosis. Denies vasomotor symptoms. - leuprolide (LUPRON) injection 11.25 mg  2. Breast cancer screening by mammogram Mammogram scheduled - MM 3D SCREEN BREAST BILATERAL; Future  3. Well woman exam with routine gynecological exam - Cytology - PAP Will follow up results of pap smear and manage accordingly. Routine preventative health maintenance measures emphasized. Please refer to After Visit Summary for other counseling recommendations.      Verita Schneiders, MD, [place] for [employer], [place]

## 2019-07-23 LAB — CYTOLOGY - PAP
Chlamydia: NEGATIVE
Comment: NEGATIVE
Comment: NEGATIVE
Comment: NEGATIVE
Comment: NORMAL
Diagnosis: UNDETERMINED — AB
High risk HPV: NEGATIVE
Neisseria Gonorrhea: NEGATIVE
Trichomonas: NEGATIVE

## 2019-08-30 ENCOUNTER — Ambulatory Visit
Admission: RE | Admit: 2019-08-30 | Discharge: 2019-08-30 | Disposition: A | Discharge status: Home / Self Care | Payer: 59 | Source: Ambulatory Visit | Attending: Obstetrics & Gynecology | Admitting: Obstetrics & Gynecology

## 2019-08-30 ENCOUNTER — Other Ambulatory Visit: Payer: Self-pay

## 2019-08-30 DIAGNOSIS — Z1231 Encounter for screening mammogram for malignant neoplasm of breast: Secondary | ICD-10-CM

## 2019-09-02 ENCOUNTER — Other Ambulatory Visit: Payer: Self-pay | Admitting: Obstetrics & Gynecology

## 2019-09-02 DIAGNOSIS — R928 Other abnormal and inconclusive findings on diagnostic imaging of breast: Secondary | ICD-10-CM

## 2019-09-08 ENCOUNTER — Ambulatory Visit
Admission: RE | Admit: 2019-09-08 | Discharge: 2019-09-08 | Disposition: A | Discharge status: Home / Self Care | Payer: 59 | Source: Ambulatory Visit | Attending: Obstetrics & Gynecology | Admitting: Obstetrics & Gynecology

## 2019-09-08 ENCOUNTER — Other Ambulatory Visit: Payer: Self-pay

## 2019-09-08 DIAGNOSIS — R928 Other abnormal and inconclusive findings on diagnostic imaging of breast: Secondary | ICD-10-CM

## 2019-10-15 ENCOUNTER — Other Ambulatory Visit: Payer: Self-pay

## 2019-10-15 ENCOUNTER — Ambulatory Visit (INDEPENDENT_AMBULATORY_CARE_PROVIDER_SITE_OTHER): Payer: 59 | Admitting: *Deleted

## 2019-10-15 ENCOUNTER — Encounter: Payer: Self-pay | Admitting: *Deleted

## 2019-10-15 VITALS — BP 133/86 | HR 83 | Temp 97.6°F | Ht 63.0 in | Wt 256.0 lb

## 2019-10-15 DIAGNOSIS — N921 Excessive and frequent menstruation with irregular cycle: Secondary | ICD-10-CM

## 2019-10-15 MED ORDER — LEUPROLIDE ACETATE (3 MONTH) 11.25 MG IM KIT
11.2500 mg | PACK | Freq: Once | INTRAMUSCULAR | Status: AC
  Administered 2019-10-15: 11.25 mg via INTRAMUSCULAR

## 2019-10-15 NOTE — Progress Notes (Signed)
Patient was assessed and managed by nursing staff during this encounter. I have reviewed the chart and agree with the documentation and plan. I have also made any necessary editorial changes.  Verita Schneiders, MD 10/15/2019 11:58 AM

## 2019-10-15 NOTE — Progress Notes (Signed)
Lupron depo 11.25 mg IM administered as scheduled. Pt tolerated well. She reports no problems following last injection. Next injection due 7/30-8/13.

## 2019-10-18 ENCOUNTER — Ambulatory Visit: Payer: No Typology Code available for payment source

## 2019-12-31 ENCOUNTER — Other Ambulatory Visit: Payer: Self-pay

## 2019-12-31 ENCOUNTER — Ambulatory Visit (INDEPENDENT_AMBULATORY_CARE_PROVIDER_SITE_OTHER): Payer: No Typology Code available for payment source | Admitting: *Deleted

## 2019-12-31 VITALS — BP 123/90 | HR 91 | Ht 63.0 in | Wt 251.3 lb

## 2019-12-31 DIAGNOSIS — N921 Excessive and frequent menstruation with irregular cycle: Secondary | ICD-10-CM

## 2019-12-31 MED ORDER — LEUPROLIDE ACETATE (3 MONTH) 11.25 MG IM KIT
11.2500 mg | PACK | Freq: Once | INTRAMUSCULAR | Status: AC
  Administered 2019-12-31: 11.25 mg via INTRAMUSCULAR

## 2019-12-31 NOTE — Progress Notes (Signed)
Lupron Depo 11.25 IM administered as scheduled. Pt tolerated well. She reports that her abnormal bleeding has stopped since initiation of this therapy and she is pleased. Next dose due 10/15-10/29.

## 2020-01-03 NOTE — Progress Notes (Signed)
Patient was assessed and managed by nursing staff during this encounter. I have reviewed the chart and agree with the documentation and plan. I have also made any necessary editorial changes.  Griffin Basil, MD 01/03/2020 8:58 AM

## 2020-03-20 ENCOUNTER — Encounter: Payer: Self-pay | Admitting: *Deleted

## 2020-03-20 ENCOUNTER — Other Ambulatory Visit: Payer: Self-pay

## 2020-03-20 ENCOUNTER — Ambulatory Visit (INDEPENDENT_AMBULATORY_CARE_PROVIDER_SITE_OTHER): Payer: No Typology Code available for payment source | Admitting: *Deleted

## 2020-03-20 VITALS — BP 136/86 | HR 94 | Ht 63.0 in | Wt 249.6 lb

## 2020-03-20 DIAGNOSIS — N921 Excessive and frequent menstruation with irregular cycle: Secondary | ICD-10-CM | POA: Diagnosis not present

## 2020-03-20 DIAGNOSIS — D500 Iron deficiency anemia secondary to blood loss (chronic): Secondary | ICD-10-CM | POA: Diagnosis not present

## 2020-03-20 DIAGNOSIS — N938 Other specified abnormal uterine and vaginal bleeding: Secondary | ICD-10-CM | POA: Diagnosis not present

## 2020-03-20 MED ORDER — LEUPROLIDE ACETATE (3 MONTH) 11.25 MG IM KIT
11.2500 mg | PACK | Freq: Once | INTRAMUSCULAR | Status: AC
  Administered 2020-03-20: 11.25 mg via INTRAMUSCULAR

## 2020-03-20 NOTE — Progress Notes (Signed)
Lupron depo 11.25 mg IM administered as scheduled. Pt tolerated well. Next dose due 06/05/20-06/19/20.

## 2020-03-22 NOTE — Progress Notes (Signed)
I have reviewed this chart and agree with the RN/CMA assessment and management.    Feliz Beam, M.D. Attending Center for [employer] manager)

## 2020-06-05 ENCOUNTER — Ambulatory Visit: Payer: No Typology Code available for payment source

## 2020-06-09 ENCOUNTER — Ambulatory Visit (INDEPENDENT_AMBULATORY_CARE_PROVIDER_SITE_OTHER): Payer: No Typology Code available for payment source | Admitting: General Practice

## 2020-06-09 ENCOUNTER — Other Ambulatory Visit: Payer: Self-pay

## 2020-06-09 VITALS — BP 126/82 | HR 84 | Ht 63.0 in | Wt 253.0 lb

## 2020-06-09 DIAGNOSIS — N921 Excessive and frequent menstruation with irregular cycle: Secondary | ICD-10-CM

## 2020-06-09 NOTE — Progress Notes (Signed)
Abigail Garcia here for Depo-Lupron Injection. Injection administered without complication. Patient will return in 3 months for next injection between March 25 and April 8. Patient should return for follow up visit prior to next injection.   Derinda Late, RN 06/09/2020  11:28 AM

## 2020-09-07 ENCOUNTER — Other Ambulatory Visit (HOSPITAL_COMMUNITY)
Admission: RE | Admit: 2020-09-07 | Discharge: 2020-09-07 | Disposition: A | Discharge status: Home / Self Care | Payer: Self-pay | Source: Ambulatory Visit | Attending: Student | Admitting: Student

## 2020-09-07 ENCOUNTER — Ambulatory Visit (INDEPENDENT_AMBULATORY_CARE_PROVIDER_SITE_OTHER): Payer: No Typology Code available for payment source | Admitting: Student

## 2020-09-07 ENCOUNTER — Encounter: Payer: Self-pay | Admitting: Student

## 2020-09-07 ENCOUNTER — Other Ambulatory Visit: Payer: Self-pay

## 2020-09-07 VITALS — BP 136/94 | HR 72 | Ht 63.0 in | Wt 263.1 lb

## 2020-09-07 DIAGNOSIS — N921 Excessive and frequent menstruation with irregular cycle: Secondary | ICD-10-CM

## 2020-09-07 DIAGNOSIS — Z01419 Encounter for gynecological examination (general) (routine) without abnormal findings: Secondary | ICD-10-CM

## 2020-09-07 MED ORDER — METOPROLOL TARTRATE 25 MG PO TABS: 12.5000 mg | ORAL_TABLET | Freq: Two times a day (BID) | ORAL | 1 refills | Status: DC

## 2020-09-07 MED ORDER — MEGESTROL ACETATE 40 MG PO TABS
80.0000 mg | ORAL_TABLET | Freq: Two times a day (BID) | ORAL | 1 refills | Status: DC
Start: 2020-09-07 — End: 2021-07-31

## 2020-09-07 NOTE — Progress Notes (Signed)
  History:  Abigail Garcia is a 50 y.o. [id number] who presents to clinic today for gyn and lupron injection.  She takes her lupron for heavy bleeding; she was in the hospital in 2020 for a D and C. Her MD put her on lupron injections. She has not had any bleeding since she started Lupron. She has been getting injections every 3 months since November 2020.   The following portions of the patient's history were reviewed and updated as appropriate: allergies, current medications, family history, past medical history, social history, past surgical history and problem list.  Review of Systems:  Review of Systems  Eyes: Negative.   Respiratory: Negative.   Cardiovascular: Negative.   Gastrointestinal: Negative.   Genitourinary: Negative.   Musculoskeletal: Negative.   Neurological: Negative.   Psychiatric/Behavioral: Negative.       Objective:  Physical Exam BP (!) 136/94   Pulse 72   Ht 5\' 3"  (1.6 m)   Wt 263 lb 1.6 oz (119.3 kg)   BMI 46.61 kg/m  Physical Exam Pulmonary:     Effort: Pulmonary effort is normal.  Abdominal:     General: Abdomen is flat.  Skin:    General: Skin is warm and dry.  Neurological:     General: No focal deficit present.     Mental Status: She is alert.       Labs and Imaging No results found for this or any previous visit (from the past 24 hour(s)).  No results found.   Assessment & Plan:   1. Well woman exam   -Discussed length of time that patient has been on lupron with Dr. Rip Harbour and Dr. Ilda Basset, both recommend that patient be switched megace and she can come back and see Dr. Harolyn Rutherford in May. Patient is agreeable to this plan. Megace RX sent.  -will start cardiology care in [place]. Patient states that she is out of her lopressor; RX sent but advised patient that she needs to start new cardiology care in [place]. Patient verbalized understanding.  -Pap collected today -Patient has Mammo information; she does not need referral because  she has private insurance and was advised to call the breast center as she has the number at home -Patient desires STI screening; will draw blood work as well as collect GC CT and wet prep.   Approximately 30 minutes of total time was spent with this patient on counseling and coordination of care.   Starr Lake, [place] 09/07/2020 2:04 PM

## 2020-09-08 LAB — HEPATITIS B SURFACE ANTIGEN: Hepatitis B Surface Ag: NEGATIVE

## 2020-09-08 LAB — CERVICOVAGINAL ANCILLARY ONLY
Bacterial Vaginitis (gardnerella): NEGATIVE
Candida Glabrata: NEGATIVE
Candida Vaginitis: NEGATIVE
Chlamydia: NEGATIVE
Comment: NEGATIVE
Comment: NEGATIVE
Comment: NEGATIVE
Comment: NEGATIVE
Comment: NEGATIVE
Comment: NORMAL
Neisseria Gonorrhea: NEGATIVE
Trichomonas: NEGATIVE

## 2020-09-08 LAB — RPR: RPR Ser Ql: NONREACTIVE

## 2020-09-08 LAB — HIV ANTIBODY (ROUTINE TESTING W REFLEX): HIV Screen 4th Generation wRfx: NONREACTIVE

## 2020-09-11 LAB — CYTOLOGY - PAP
Adequacy: ABSENT
Comment: NEGATIVE
Diagnosis: NEGATIVE
High risk HPV: NEGATIVE

## 2020-09-30 ENCOUNTER — Encounter: Payer: Self-pay | Admitting: Student

## 2020-09-30 DIAGNOSIS — R87616 Satisfactory cervical smear but lacking transformation zone: Secondary | ICD-10-CM | POA: Insufficient documentation

## 2020-10-25 ENCOUNTER — Ambulatory Visit: Payer: Self-pay | Admitting: Obstetrics & Gynecology

## 2020-12-01 DIAGNOSIS — Z419 Encounter for procedure for purposes other than remedying health state, unspecified: Secondary | ICD-10-CM | POA: Diagnosis not present

## 2020-12-05 ENCOUNTER — Encounter: Payer: Self-pay | Admitting: *Deleted

## 2021-01-01 DIAGNOSIS — Z419 Encounter for procedure for purposes other than remedying health state, unspecified: Secondary | ICD-10-CM | POA: Diagnosis not present

## 2021-02-01 DIAGNOSIS — Z419 Encounter for procedure for purposes other than remedying health state, unspecified: Secondary | ICD-10-CM | POA: Diagnosis not present

## 2021-03-03 DIAGNOSIS — Z419 Encounter for procedure for purposes other than remedying health state, unspecified: Secondary | ICD-10-CM | POA: Diagnosis not present

## 2021-04-03 DIAGNOSIS — Z419 Encounter for procedure for purposes other than remedying health state, unspecified: Secondary | ICD-10-CM | POA: Diagnosis not present

## 2021-04-24 ENCOUNTER — Other Ambulatory Visit: Payer: Self-pay

## 2021-04-24 ENCOUNTER — Ambulatory Visit (INDEPENDENT_AMBULATORY_CARE_PROVIDER_SITE_OTHER): Payer: Medicaid Other | Admitting: Internal Medicine

## 2021-04-24 ENCOUNTER — Encounter: Payer: Self-pay | Admitting: Internal Medicine

## 2021-04-24 VITALS — BP 128/93 | HR 92 | Temp 98.5°F | Resp 16 | Ht 63.0 in | Wt 256.6 lb

## 2021-04-24 DIAGNOSIS — D500 Iron deficiency anemia secondary to blood loss (chronic): Secondary | ICD-10-CM | POA: Diagnosis not present

## 2021-04-24 DIAGNOSIS — M0609 Rheumatoid arthritis without rheumatoid factor, multiple sites: Secondary | ICD-10-CM

## 2021-04-24 DIAGNOSIS — I2510 Atherosclerotic heart disease of native coronary artery without angina pectoris: Secondary | ICD-10-CM

## 2021-04-24 DIAGNOSIS — L720 Epidermal cyst: Secondary | ICD-10-CM | POA: Diagnosis not present

## 2021-04-24 DIAGNOSIS — E785 Hyperlipidemia, unspecified: Secondary | ICD-10-CM

## 2021-04-24 DIAGNOSIS — Z9861 Coronary angioplasty status: Secondary | ICD-10-CM

## 2021-04-24 MED ORDER — PREDNISONE 10 MG PO TABS: ORAL_TABLET | ORAL | 0 refills | Status: AC

## 2021-04-24 MED ORDER — METOPROLOL TARTRATE 25 MG PO TABS
12.5000 mg | ORAL_TABLET | Freq: Two times a day (BID) | ORAL | 1 refills | Status: DC
Start: 2021-04-24 — End: 2022-07-15

## 2021-04-24 NOTE — Assessment & Plan Note (Addendum)
Patient has a history of rheumatoid arthritis, rheumatoid factor negative, CCP positive.  She has previously been on Enbrel, methotrexate, prednisone and arava.  She presents today with a rheumatoid arthritic flareup for the past 2 weeks.  She is having joint pain and swelling in bilateral hands and bilateral feet, which is typical for her flareups.  She is now also having pain and swelling in her left elbow.  She has intermittently been using naproxen without any relief.  States her symptoms have worsened since she had COVID a few weeks ago.  She has had rheumatoid arthritis for 23 years.  She states that she stopped taking medications in the past due to side effects specifically to her lungs.  She would like to reestablish with a rheumatologist, has a provider in mind and will call us with the provider's name.  She states that the pain has been limiting her function and debilitating.  She has not had a flareup like this in several years.  Assessment/plan: Clinical presentation and history consistent with a rheumatoid arthritic flareup.  Will prescribe a prednisone taper and an urgent referral to rheumatology.  -Prednisone taper with 20 mg for 5 days followed by 15 mg for 5 days followed by 10 mg for 5 days and then 5 mg for 5 days and then stop - Rheumatology referral -Follow-up in 1 week Patient is to call if her pain is not well controlled with the prednisone, can consider short duration of Mobic alongside however holding off now due to risks of combining NSAIDs with prednisone

## 2021-04-24 NOTE — Assessment & Plan Note (Addendum)
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  She does have a history of fibroids but her iron deficiency anemia was not attributed to this.  Previously followed with GI and hematology.  Required many IV iron infusions in the past.  No recent labs, follow-up CBC and iron studies    ADDENDUM- CBC    Component Value Date/Time   WBC 8.9 04/24/2021 1642   WBC 17.9 (H) 03/14/2019 0630   RBC 5.28 04/24/2021 1642   RBC 3.73 (L) 03/14/2019 0630   HGB 12.2 04/24/2021 1642   HGB 12.1 07/19/2016 1033   HCT 39.0 04/24/2021 1642   HCT 36.9 07/19/2016 1033   PLT 488 (H) 04/24/2021 1642   MCV 74 (L) 04/24/2021 1642   MCV 79.9 07/19/2016 1033   MCH 23.1 (L) 04/24/2021 1642   MCH 20.6 (L) 03/14/2019 0630   MCHC 31.3 (L) 04/24/2021 1642   MCHC 29.6 (L) 03/14/2019 0630   RDW 15.7 (H) 04/24/2021 1642   RDW 14.8 (H) 07/19/2016 1033   LYMPHSABS 2.6 04/24/2021 1642   LYMPHSABS 2.7 07/19/2016 1033   MONOABS 0.6 07/19/2016 1033   EOSABS 0.4 04/24/2021 1642   BASOSABS 0.1 04/24/2021 1642   BASOSABS 0.1 07/19/2016 1033   Iron/TIBC/Ferritin/ %Sat    Component Value Date/Time   IRON 34 04/24/2021 1642   IRON 52 02/29/2016 1209   TIBC 445 04/24/2021 1642   TIBC 384 02/29/2016 1209   FERRITIN 24 04/24/2021 1642   FERRITIN 9 07/19/2016 1033   IRONPCTSAT 8 (LL) 04/24/2021 1642   IRONPCTSAT 14 (L) 02/29/2016 1209   IRONPCTSAT 3 (L) 08/20/2013 1437   Given her history of iron deficiency anemia and multiple iron infusions in the past due to malabsorption will recommend iron infusion at this time due to low iron saturation and low normal iron.  Of note, per chart review there was recommendation of possible capsule endoscopy on last colonoscopy report if her anemia persisted.  Will continue to monitor her blood counts closely. - Order iron infusion

## 2021-04-24 NOTE — Progress Notes (Signed)
   CC: Rheumatoid arthritis flare, hyperlipidemia  HPI:  [name] is a 50 y.o. with history of CAD, obesity and rheumatoid arthritis presenting for a rheumatoid arthritis flareup for the past few weeks.  She is also requesting a refill on metoprolol and to have her cholesterol checked.  She would also like a referral to dermatology for a cyst on her back.    For details of today's visit and the status of his chronic medical issues please refer to the assessment and plan.   Past Medical History:  Diagnosis Date   Anemia    CAD (coronary artery disease)    a. 11/2012 NSTEMI/Cath/PCI: LM nl, LAD 80-90 thrombotic (tx with Heparin x 2 days then aspiration thrombectomy and PTCA), RI nl, LCX sm/nl, RCA nl, EF 55-65%.   Esophagitis    grade 1   Hx of echocardiogram    a. Echo (6/14) with EF 55-60%.    Menorrhagia    NSTEMI (non-ST elevated myocardial infarction) ([place])    11/2012    Obesity    RA (rheumatoid arthritis) ([place])    Review of Systems:   Review of Systems  Constitutional:  Negative for chills, fever and malaise/fatigue.  Respiratory:  Negative for cough and shortness of breath.   Gastrointestinal:  Negative for abdominal pain, diarrhea, nausea and vomiting.  Musculoskeletal:  Positive for joint pain.  Skin:  Negative for rash.  Neurological:  Negative for tingling.    Physical Exam:  Vitals:   04/24/21 1605  BP: (!) 128/93  Pulse: 92  Resp: 16  Temp: 98.5 F (36.9 C)  SpO2: 98%  Weight: 256 lb 9.6 oz (116.4 kg)  Height: 5\' 3"  (1.6 m)   Physical Exam General: alert, appears stated age, in no acute distress HEENT: Normocephalic, atraumatic, EOM intact, conjunctiva normal CV: Regular rate and rhythm, no murmurs rubs or gallops Pulm: Clear to auscultation bilaterally, normal work of breathing Abdomen: Soft, nondistended, bowel sounds present, no tenderness to palpation MSK: Multiple joint deformities of bilateral hands with notable nodules along the IP joints and  a prominent nodule on the distal left forearm, no overlying erythema but significant swelling noted of bilateral hands and right foot Skin: Warm and dry Neuro: Alert and oriented x3   Assessment & Plan:   See Encounters Tab for problem based charting.  Patient seen with Dr.  Saverio Danker

## 2021-04-24 NOTE — Patient Instructions (Addendum)
Ms Orchard,   It was a pleasure meeting you today. Today we discussed the following:   Rheumatoid arthritis-for your flareup I want you to take a prednisone taper.  Take 20 mg daily for 5 days followed by 15 mg daily for 5 days followed by 10 mg daily for 5 days and then 5 mg daily for 5 days.  I want to also follow-up in 1 week. I have ordered some blood work today I will call you with the results of your CBC, iron studies and lipid panel.      Please call the internal medicine center clinic if you have any questions or concerns, we may be able to help and keep you from a long and expensive emergency room wait. Our clinic and after hours phone number is [phone], the best time to call is Monday through Friday 9 am to 4 pm but there is always someone available 24/7 if you have an emergency. If you need medication refills please notify your pharmacy one week in advance and they will send [place] a request.   Thank you for allowing [place] to be a part of your care!

## 2021-04-24 NOTE — Assessment & Plan Note (Signed)
Patient is requesting a refill on her metoprolol.  States that she would like to reestablish with cardiology.  States that she can likely call for an appointment.  Will call us if she needs a referral again.

## 2021-04-24 NOTE — Assessment & Plan Note (Addendum)
History of hyperlipidemia and CAD.  Currently not taking rosuvastatin 20 mg.  We will recheck a lipid panel.   ADDENDUM: Lipid Panel     Component Value Date/Time   CHOL 239 (H) 04/24/2021 1642   TRIG 144 04/24/2021 1642   HDL 43 04/24/2021 1642   CHOLHDL 5.6 (H) 04/24/2021 1642   CHOLHDL 4.7 03/12/2019 0840   VLDL 18 03/12/2019 0840   LDLCALC 170 (H) 04/24/2021 1642   LABVLDL 26 04/24/2021 1642   ASCVD risk 3.2%, however LDL 170 and history of CAD status post PCI.  Will continue high intensity statin for secondary prevention.  Patient states that she did not tolerate rosuvastatin in the past this caused her to have nausea.  Will attempt atorvastatin 40 mg daily.  Patient to call if she is not tolerating this medication. -Start atorvastatin 40 mg daily

## 2021-04-24 NOTE — Assessment & Plan Note (Signed)
Patient notices a small bump on her back that intermittently comes and goes.  States most of the time it is not painful.  Denies any drainage or erythema.  On examination, there is an approximately 1 cm nontender bump with a black dot in the center along medial side of the left scapula/above her bra.  No surrounding skin changes or other lesions seen.  Patient denies any itching, redness or fevers or chills.  Assessment/plan: Findings consistent with a possible epidermal cyst.  Recommended warm compresses and referral to dermatology

## 2021-04-25 LAB — LIPID PANEL
Chol/HDL Ratio: 5.6 ratio — ABNORMAL HIGH (ref 0.0–4.4)
Cholesterol, Total: 239 mg/dL — ABNORMAL HIGH (ref 100–199)
HDL: 43 mg/dL (ref >39)
LDL Chol Calc (NIH): 170 mg/dL — ABNORMAL HIGH (ref 0–99)
Triglycerides: 144 mg/dL (ref 0–149)
VLDL Cholesterol Cal: 26 mg/dL (ref 5–40)

## 2021-04-25 LAB — CBC WITH DIFFERENTIAL/PLATELET
Basophils Absolute: 0.1 10*3/uL (ref 0.0–0.2)
Basos: 1 %
EOS (ABSOLUTE): 0.4 10*3/uL (ref 0.0–0.4)
Eos: 5 %
Hematocrit: 39 % (ref 34.0–46.6)
Hemoglobin: 12.2 g/dL (ref 11.1–15.9)
Immature Grans (Abs): 0 10*3/uL (ref 0.0–0.1)
Immature Granulocytes: 0 %
Lymphocytes Absolute: 2.6 10*3/uL (ref 0.7–3.1)
Lymphs: 29 %
MCH: 23.1 pg — ABNORMAL LOW (ref 26.6–33.0)
MCHC: 31.3 g/dL — ABNORMAL LOW (ref 31.5–35.7)
MCV: 74 fL — ABNORMAL LOW (ref 79–97)
Monocytes Absolute: 0.9 10*3/uL (ref 0.1–0.9)
Monocytes: 10 %
Neutrophils Absolute: 4.9 10*3/uL (ref 1.4–7.0)
Neutrophils: 55 %
Platelets: 488 10*3/uL — ABNORMAL HIGH (ref 150–450)
RBC: 5.28 x10E6/uL (ref 3.77–5.28)
RDW: 15.7 % — ABNORMAL HIGH (ref 11.7–15.4)
WBC: 8.9 10*3/uL (ref 3.4–10.8)

## 2021-04-25 NOTE — Progress Notes (Signed)
Internal Medicine Clinic Attending  I saw and evaluated the patient.  I personally confirmed the key portions of the history and exam documented by Dr. Laural Golden and I reviewed pertinent patient test results.  The assessment, diagnosis, and plan were formulated together and I agree with the documentation in the resident's note. Exam concerning for RA flare, discussed treatment with prednisone while attempting to re-establish with rheumatology. Per chart review, she has previously tried several DMARDs. Avoiding concurrent treatment with NSAIDs to lower risk of SE. Discussed need for close fu and possible need to initiate disease modifying therapy such as MTX if unable to establish soon with rheum.

## 2021-04-30 ENCOUNTER — Other Ambulatory Visit: Payer: Self-pay | Admitting: Internal Medicine

## 2021-04-30 DIAGNOSIS — E785 Hyperlipidemia, unspecified: Secondary | ICD-10-CM

## 2021-04-30 MED ORDER — ROSUVASTATIN CALCIUM 20 MG PO TABS: 20.0000 mg | ORAL_TABLET | Freq: Every day | ORAL | 3 refills | Status: DC

## 2021-05-01 ENCOUNTER — Telehealth: Payer: Self-pay | Admitting: *Deleted

## 2021-05-01 LAB — IRON,TIBC AND FERRITIN PANEL
Ferritin: 24 ng/mL (ref 15–150)
Iron Saturation: 8 % — CL (ref 15–55)
Iron: 34 ug/dL (ref 27–159)
Total Iron Binding Capacity: 445 ug/dL (ref 250–450)
UIBC: 411 ug/dL (ref 131–425)

## 2021-05-01 LAB — SPECIMEN STATUS REPORT

## 2021-05-01 MED ORDER — ATORVASTATIN CALCIUM 40 MG PO TABS: 40.0000 mg | ORAL_TABLET | Freq: Every day | ORAL | 11 refills | Status: DC

## 2021-05-01 NOTE — Telephone Encounter (Signed)
Called / talked to Abigail Garcia to schedule iron infusion x 1 per Dr Laural Golden- Iron (Ferrlecit) infusion  appt schedule for Thursday 05/03/21 @ 12noon at [facility]. Pt was called and informed of appt ; instructed to register at Admissions prior to the appt. Stated she understands.

## 2021-05-01 NOTE — Addendum Note (Signed)
Addended by: Mike Craze on: 05/01/2021 11:21 AM   Modules accepted: Orders

## 2021-05-03 ENCOUNTER — Other Ambulatory Visit: Payer: Self-pay

## 2021-05-03 ENCOUNTER — Ambulatory Visit (INDEPENDENT_AMBULATORY_CARE_PROVIDER_SITE_OTHER): Payer: Medicaid Other | Admitting: Internal Medicine

## 2021-05-03 ENCOUNTER — Encounter: Payer: Self-pay | Admitting: Internal Medicine

## 2021-05-03 ENCOUNTER — Ambulatory Visit (HOSPITAL_COMMUNITY)
Admission: RE | Admit: 2021-05-03 | Discharge: 2021-05-03 | Disposition: A | Discharge status: Home / Self Care | Payer: Medicaid Other | Source: Ambulatory Visit | Attending: Student in an Organized Health Care Education/Training Program | Admitting: Student in an Organized Health Care Education/Training Program

## 2021-05-03 VITALS — BP 121/77 | HR 80 | Temp 97.8°F | Ht 63.0 in | Wt 256.2 lb

## 2021-05-03 DIAGNOSIS — D500 Iron deficiency anemia secondary to blood loss (chronic): Secondary | ICD-10-CM | POA: Insufficient documentation

## 2021-05-03 DIAGNOSIS — M069 Rheumatoid arthritis, unspecified: Secondary | ICD-10-CM

## 2021-05-03 DIAGNOSIS — M0609 Rheumatoid arthritis without rheumatoid factor, multiple sites: Secondary | ICD-10-CM

## 2021-05-03 DIAGNOSIS — Z419 Encounter for procedure for purposes other than remedying health state, unspecified: Secondary | ICD-10-CM | POA: Diagnosis not present

## 2021-05-03 MED ORDER — SODIUM CHLORIDE 0.9 % IV SOLN
250.0000 mg | Freq: Once | INTRAVENOUS | Status: AC
  Administered 2021-05-03: 250 mg via INTRAVENOUS
  Filled 2021-05-03 (×2): qty 20

## 2021-05-03 NOTE — Progress Notes (Signed)
Subjective:  CC: RA  HPI:  [name] is a 49 y.o. female with a past medical history stated below and presents today for RA. Please see problem based assessment and plan for additional details.  Past Medical History:  Diagnosis Date   Anemia    CAD (coronary artery disease)    a. 11/2012 NSTEMI/Cath/PCI: LM nl, LAD 80-90 thrombotic (tx with Heparin x 2 days then aspiration thrombectomy and PTCA), RI nl, LCX sm/nl, RCA nl, EF 55-65%.   Esophagitis    grade 1   Hx of echocardiogram    a. Echo (6/14) with EF 55-60%.    Menorrhagia    NSTEMI (non-ST elevated myocardial infarction) ([place])    11/2012    Obesity    RA (rheumatoid arthritis) ([facility])     Current Outpatient Medications on File Prior to Visit  Medication Sig Dispense Refill   aspirin 81 MG EC tablet Take 1 tablet (81 mg total) by mouth daily. 30 tablet 0   atorvastatin (LIPITOR) 40 MG tablet Take 1 tablet (40 mg total) by mouth daily. 30 tablet 11   megestrol (MEGACE) 40 MG tablet Take 2 tablets (80 mg total) by mouth 2 (two) times daily. 60 tablet 1   metoprolol tartrate (LOPRESSOR) 25 MG tablet Take 0.5 tablets (12.5 mg total) by mouth 2 (two) times daily. 180 tablet 1   predniSONE (DELTASONE) 10 MG tablet Take 2 tablets (20 mg total) by mouth daily with breakfast for 5 days, THEN 1.5 tablets (15 mg total) daily with breakfast for 5 days, THEN 1 tablet (10 mg total) daily with breakfast for 5 days, THEN 0.5 tablets (5 mg total) daily with breakfast for 5 days. 25 tablet 0   Current Facility-Administered Medications on File Prior to Visit  Medication Dose Route Frequency Provider Last Rate Last Admin   leuprolide (LUPRON) injection 11.25 mg  11.25 mg Intramuscular Q90 days Anyanwu, Ugonna A, MD   11.25 mg at 06/09/20 1140    Family History  Problem Relation Age of Onset   Heart disease Other    Breast cancer Paternal Grandmother     Social History   Socioeconomic History   Marital status: Single    Spouse  name: Not on file   Number of children: Not on file   Years of education: Not on file   Highest education level: Not on file  Occupational History   Not on file  Tobacco Use   Smoking status: Never   Smokeless tobacco: Never  Vaping Use   Vaping Use: Never used  Substance and Sexual Activity   Alcohol use: No   Drug use: No   Sexual activity: Yes    Birth control/protection: Surgical    Comment: BTL  Other Topics Concern   Not on file  Social History Narrative   Not on file   Social Determinants of Health   Financial Resource Strain: Not on file  Food Insecurity: No Food Insecurity   Worried About Charity fundraiser in the Last Year: Never true   Ran Out of Food in the Last Year: Never true  Transportation Needs: Unmet Transportation Needs   Lack of Transportation (Medical): Yes   Lack of Transportation (Non-Medical): Yes  Physical Activity: Not on file  Stress: Not on file  Social Connections: Not on file  Intimate Partner Violence: Not on file    Review of Systems: ROS negative except for what is noted on the assessment and plan.  Objective:  Vitals:   05/03/21 1057  BP: 121/77  Pulse: 80  Temp: 97.8 F (36.6 C)  TempSrc: Oral  SpO2: 95%  Weight: 256 lb 3.2 oz (116.2 kg)  Height: 5\' 3"  (1.6 m)    Physical Exam: Gen: A&O x3 and in no apparent distress, well appearing and nourished. HEENT:    Head - normocephalic, atraumatic.    Eye - visual acuity grossly intact, conjunctiva clear, sclera non-icteric, EOM intact.    Mouth - No obvious caries or periodontal disease. Neck: no masses or nodules, AROM intact. CV: RRR, no murmurs, S1/S2 presents  Resp: Clear to ascultation bilaterally  Abd: BS (+) x4, soft, non-tender abdomen, without hepatosplenomegaly or masses MSK: Grossly normal AROM and strength x4 extremities. Skin: good skin turgor, no rashes, unusual bruising, or prominent lesions.  Neuro: No focal deficits, grossly normal sensation and  coordination.  Psych: Oriented x3 and responding appropriately. Intact memory, normal mood, judgement, affect, and insight.    Assessment & Plan:  See Encounters Tab for problem based charting.  Patient discussed with Dr. Kelle Darting, D.O. [facility]  PGY-3 Pager: [phone]  Phone: [phone] Date 05/04/2021  Time 5:12 AM

## 2021-05-03 NOTE — Patient Instructions (Signed)
Thank you, Abigail Garcia for allowing [place] to provide your care today. Today we discussed rheumatoid arthritis and her steroid prescription.    Labs/Tests Ordered:  Lab Orders         POCT glucose (manual entry)      Referrals Ordered:  - We are working on getting you set up with [facility] in [place].   Medication Changes:  There are no discontinued medications.   No orders of the defined types were placed in this encounter.   Health Maintenance Screening: There are no preventive care reminders to display for this patient.   Instructions:   Follow up: 2-3 months with your primary care physician.   Remember: If you have any questions or concerns, call our clinic at [phone] or after hours call [phone] and ask for the internal medicine resident on call.  Marianna Payment, D.O. [place]

## 2021-05-04 ENCOUNTER — Encounter: Payer: Self-pay | Admitting: Internal Medicine

## 2021-05-04 NOTE — Assessment & Plan Note (Signed)
Iron transfusion scheduled for today.

## 2021-05-04 NOTE — Progress Notes (Signed)
Internal Medicine Clinic Attending  Case discussed with Dr. Coe  At the time of the visit.  We reviewed the resident's history and exam and pertinent patient test results.  I agree with the assessment, diagnosis, and plan of care documented in the resident's note.  

## 2021-05-04 NOTE — Assessment & Plan Note (Signed)
Patient presents for a follow up for RA. She has been on a steroid taper for roughly a week with improvement of her joint pain. He blood pressure was evaluated today and was WNL. Considering her history of prediabetes, he CBG was check without significant hyperglycemia.   Plan:  - Follow up Rheumatology referral.

## 2021-05-07 LAB — GLUCOSE, CAPILLARY: Glucose-Capillary: 133 mg/dL — ABNORMAL HIGH (ref 70–99)

## 2021-05-18 ENCOUNTER — Ambulatory Visit (INDEPENDENT_AMBULATORY_CARE_PROVIDER_SITE_OTHER): Payer: Managed Care, Other (non HMO) | Admitting: Internal Medicine

## 2021-05-18 ENCOUNTER — Encounter: Payer: Self-pay | Admitting: Internal Medicine

## 2021-05-18 DIAGNOSIS — R059 Cough, unspecified: Secondary | ICD-10-CM | POA: Insufficient documentation

## 2021-05-18 DIAGNOSIS — M069 Rheumatoid arthritis, unspecified: Secondary | ICD-10-CM | POA: Diagnosis not present

## 2021-05-18 DIAGNOSIS — R051 Acute cough: Secondary | ICD-10-CM

## 2021-05-18 MED ORDER — PREDNISONE 5 MG PO TABS: 5.0000 mg | ORAL_TABLET | Freq: Every day | ORAL | 0 refills | Status: DC

## 2021-05-18 MED ORDER — ALBUTEROL SULFATE HFA 108 (90 BASE) MCG/ACT IN AERS: 1.0000 | INHALATION_SPRAY | Freq: Four times a day (QID) | RESPIRATORY_TRACT | 0 refills | Status: DC | PRN

## 2021-05-18 NOTE — Assessment & Plan Note (Signed)
Assessment/Plan: Acute cough. Expect viral etiology at this time. Patient will obtain Covid and Flu test. She is on Prednisone for RA, see problem. Will send albuterol to help with shortness of breath. Requesting follow up for patient on Monday or Tuesday of next week in person. If patients viral testing is negative and she is not improving will send for chest xray and consider treating for bacterial infection.

## 2021-05-18 NOTE — Progress Notes (Signed)
°  [facility] Internal Medicine Residency Telephone Encounter Continuity Care Appointment  HPI:  This telephone encounter was created for [name] on 05/18/2021 for the following purpose/cc : cough and rheumatoid arthritis flare. Patient has had a bad cough since Tuesday. She has had bronchitis since being a child and oral albuterol helped. She felt the same when she previously had Covid. She has not been tested for Covid or Flu since having symptoms. Minimal sputum production and some shortness of breath with increased activity. No chest pain, rhinorrhea, itchy eyes, or ear pain.    Past Medical History:  Past Medical History:  Diagnosis Date   Anemia    CAD (coronary artery disease)    a. 11/2012 NSTEMI/Cath/PCI: LM nl, LAD 80-90 thrombotic (tx with Heparin x 2 days then aspiration thrombectomy and PTCA), RI nl, LCX sm/nl, RCA nl, EF 55-65%.   Esophagitis    grade 1   Hx of echocardiogram    a. Echo (6/14) with EF 55-60%.    Menorrhagia    NSTEMI (non-ST elevated myocardial infarction) ([place])    11/2012    Obesity    RA (rheumatoid arthritis) ([facility])      ROS:  Neg for fever and chill.   Assessment / Plan / Recommendations:  Please see A&P under problem oriented charting for assessment of the patient's acute and chronic medical conditions.  As always, pt is advised that if symptoms worsen or new symptoms arise, they should go to an urgent care facility or to to ER for further evaluation.   Consent and Medical Decision Making:  Patient discussed with Dr. Jimmye Norman This is a telephone encounter between Abigail Garcia and Lorene Dy on 05/18/2021 for acute cough and RA The visit was conducted with the patient located at home and Lorene Dy at [facility]. The patient's identity was confirmed using their DOB and current address. The patient has consented to being evaluated through a telephone encounter and understands the associated risks (an examination cannot be done and the patient may  need to come in for an appointment) / benefits (allows the patient to remain at home, decreasing exposure to coronavirus). I personally spent 22 minutes on medical discussion.

## 2021-05-18 NOTE — Assessment & Plan Note (Signed)
Patient has been on steroid taper and started to have increased joint pain when getting to 5 mg dose. She has 3 prednisone 10 mg tablets left and reports she has not been schedule with rheumatology today. Rheumatology referral placed and I will send a message to our clinic staff to confirm. Patient will continue 5 mg daily to control symptoms while awaiting appointment which I expect within 2-4 weeks.

## 2021-05-21 NOTE — Progress Notes (Signed)
Internal Medicine Clinic Attending  Case discussed with Dr. Steen  At the time of the visit.  We reviewed the resident's history and exam and pertinent patient test results.  I agree with the assessment, diagnosis, and plan of care documented in the resident's note.  

## 2021-06-03 DIAGNOSIS — Z419 Encounter for procedure for purposes other than remedying health state, unspecified: Secondary | ICD-10-CM | POA: Diagnosis not present

## 2021-06-15 DIAGNOSIS — I252 Old myocardial infarction: Secondary | ICD-10-CM | POA: Diagnosis not present

## 2021-06-15 DIAGNOSIS — M19041 Primary osteoarthritis, right hand: Secondary | ICD-10-CM | POA: Diagnosis not present

## 2021-06-15 DIAGNOSIS — Z7952 Long term (current) use of systemic steroids: Secondary | ICD-10-CM | POA: Diagnosis not present

## 2021-06-15 DIAGNOSIS — M7732 Calcaneal spur, left foot: Secondary | ICD-10-CM | POA: Diagnosis not present

## 2021-06-15 DIAGNOSIS — M19042 Primary osteoarthritis, left hand: Secondary | ICD-10-CM | POA: Diagnosis not present

## 2021-06-15 DIAGNOSIS — M19071 Primary osteoarthritis, right ankle and foot: Secondary | ICD-10-CM | POA: Diagnosis not present

## 2021-06-15 DIAGNOSIS — M0600 Rheumatoid arthritis without rheumatoid factor, unspecified site: Secondary | ICD-10-CM | POA: Diagnosis not present

## 2021-06-15 DIAGNOSIS — M19032 Primary osteoarthritis, left wrist: Secondary | ICD-10-CM | POA: Diagnosis not present

## 2021-06-15 DIAGNOSIS — M19031 Primary osteoarthritis, right wrist: Secondary | ICD-10-CM | POA: Diagnosis not present

## 2021-06-15 DIAGNOSIS — Z13820 Encounter for screening for osteoporosis: Secondary | ICD-10-CM | POA: Diagnosis not present

## 2021-06-15 DIAGNOSIS — N96 Recurrent pregnancy loss: Secondary | ICD-10-CM | POA: Diagnosis not present

## 2021-06-27 DIAGNOSIS — M059 Rheumatoid arthritis with rheumatoid factor, unspecified: Secondary | ICD-10-CM | POA: Diagnosis not present

## 2021-06-27 DIAGNOSIS — Z79899 Other long term (current) drug therapy: Secondary | ICD-10-CM | POA: Diagnosis not present

## 2021-07-04 DIAGNOSIS — Z419 Encounter for procedure for purposes other than remedying health state, unspecified: Secondary | ICD-10-CM | POA: Diagnosis not present

## 2021-07-27 DIAGNOSIS — Z780 Asymptomatic menopausal state: Secondary | ICD-10-CM | POA: Diagnosis not present

## 2021-07-30 ENCOUNTER — Telehealth: Payer: Self-pay | Admitting: Internal Medicine

## 2021-07-30 NOTE — Telephone Encounter (Signed)
Return pt's call about her bleeding Stated she was able to get a GYN appt at another office tomorrow. She wanted to know if she might need an iron transfusion. Informed her to wait until after her appt tomorrow; their office should be able to draw labs. She stated ok and will let us know.

## 2021-07-30 NOTE — Telephone Encounter (Signed)
Pt requesting a call back. Patient states she started back have bleeding  and is not sch to see her GYN until the following, which is a work in appt for :  Name: Abigail, Garcia MRN: [id number]  Date: 08/29/2021 Status: Sch  Time: 10:15 AM Length: 20  Visit Type: GYN VISIT 30 [679] Copay: $0.00  Provider: Clarnce Flock, MD

## 2021-07-30 NOTE — Telephone Encounter (Signed)
Sounds good. Thanks for the update!

## 2021-07-31 ENCOUNTER — Ambulatory Visit (INDEPENDENT_AMBULATORY_CARE_PROVIDER_SITE_OTHER): Payer: Medicaid Other | Admitting: Obstetrics & Gynecology

## 2021-07-31 ENCOUNTER — Encounter: Payer: Self-pay | Admitting: Obstetrics & Gynecology

## 2021-07-31 ENCOUNTER — Other Ambulatory Visit: Payer: Self-pay

## 2021-07-31 VITALS — BP 114/80 | HR 98 | Wt 260.4 lb

## 2021-07-31 DIAGNOSIS — D509 Iron deficiency anemia, unspecified: Secondary | ICD-10-CM | POA: Diagnosis not present

## 2021-07-31 DIAGNOSIS — Z1231 Encounter for screening mammogram for malignant neoplasm of breast: Secondary | ICD-10-CM

## 2021-07-31 DIAGNOSIS — N939 Abnormal uterine and vaginal bleeding, unspecified: Secondary | ICD-10-CM | POA: Diagnosis not present

## 2021-07-31 DIAGNOSIS — D219 Benign neoplasm of connective and other soft tissue, unspecified: Secondary | ICD-10-CM | POA: Diagnosis not present

## 2021-07-31 MED ORDER — MEGESTROL ACETATE 40 MG PO TABS: 80.0000 mg | ORAL_TABLET | Freq: Two times a day (BID) | ORAL | 1 refills | Status: DC

## 2021-07-31 MED ORDER — MEGESTROL ACETATE 40 MG PO TABS: 80.0000 mg | ORAL_TABLET | Freq: Every day | ORAL | 5 refills | Status: DC

## 2021-07-31 NOTE — Progress Notes (Signed)
GYNECOLOGY OFFICE VISIT NOTE  History:   Abigail Garcia is a 51 y.o. [id number] with history of CAD here today for recurrent abnormal bleeding.  History of AUB and fibroids in the past, has been treated with Lupron for years. Was stopped last year and given Megace as needed. She did not have any heavy bleeding until 2 weeks ago when it started. She thought it was a normal period but it got heavier. She took the prescribed Megace (she thinks she takes 40 or 80 mg daily) and the bleeding has decreased but still present.  She reports fatigue and feels she is anemic again, has history of iron deficiency anemia.  She denies any  pelvic pain or other concerns.    Past Medical History:  Diagnosis Date   Anemia    CAD (coronary artery disease)    a. 11/2012 NSTEMI/Cath/PCI: LM nl, LAD 80-90 thrombotic (tx with Heparin x 2 days then aspiration thrombectomy and PTCA), RI nl, LCX sm/nl, RCA nl, EF 55-65%.   Esophagitis    grade 1   Hx of echocardiogram    a. Echo (6/14) with EF 55-60%.    Menorrhagia    NSTEMI (non-ST elevated myocardial infarction) ([place])    11/2012    Obesity    RA (rheumatoid arthritis) ([place])     Past Surgical History:  Procedure Laterality Date   bilateral tubal     CARDIAC CATHETERIZATION  2014   CESAREAN SECTION     CORONARY ANGIOGRAM  11/19/2012   Procedure: CORONARY ANGIOGRAM;  Surgeon: Peter M Martinique, MD;  Location: [facility] CATH LAB;  Service: Cardiovascular;;   DILATION AND CURETTAGE OF UTERUS     DILATION AND CURETTAGE OF UTERUS N/A 03/13/2019   Procedure: DILATATION AND CURETTAGE;  Surgeon: Emily Filbert, MD;  Location: [place];  Service: Gynecology;  Laterality: N/A;   INTRAVASCULAR ULTRASOUND  11/19/2012   Procedure: INTRAVASCULAR ULTRASOUND;  Surgeon: Peter M Martinique, MD;  Location: [facility] CATH LAB;  Service: Cardiovascular;;   LEFT HEART CATHETERIZATION WITH CORONARY ANGIOGRAM N/A 11/16/2012   Procedure: LEFT HEART CATHETERIZATION WITH CORONARY ANGIOGRAM;  Surgeon: Peter M Martinique,  MD;  Location: [facility] CATH LAB;  Service: Cardiovascular;  Laterality: N/A;   PERCUTANEOUS CORONARY INTERVENTION-BALLOON ONLY  11/19/2012   Procedure: PERCUTANEOUS CORONARY INTERVENTION-BALLOON ONLY;  Surgeon: Peter M Martinique, MD;  Location: [facility] CATH LAB;  Service: Cardiovascular;;    The following portions of the patient's history were reviewed and updated as appropriate: allergies, current medications, past family history, past medical history, past social history, past surgical history and problem list.   Health Maintenance:  Normal pap and negative HRHPV on 09/07/2020.  Normal mammogram on 09/08/2019.   Review of Systems:  Pertinent items noted in HPI and remainder of comprehensive ROS otherwise negative.  Physical Exam:  BP 114/80    Pulse 98    Wt 260 lb 6.4 oz (118.1 kg)    LMP 07/14/2021 (Exact Date)    BMI 46.13 kg/m  CONSTITUTIONAL: Well-developed, well-nourished female in no acute distress.  HEENT:  Normocephalic, atraumatic. External right and left ear normal. No scleral icterus.  NECK: Normal range of motion, supple, no masses noted on observation SKIN: No rash noted. Not diaphoretic. No erythema. No pallor. MUSCULOSKELETAL: Normal range of motion. No edema noted. NEUROLOGIC: Alert and oriented to person, place, and time. Normal muscle tone coordination. No cranial nerve deficit noted. PSYCHIATRIC: Normal mood and affect. Normal behavior. Normal judgment and thought content. CARDIOVASCULAR: Normal heart rate noted RESPIRATORY:  Effort and breath sounds normal, no problems with respiration noted ABDOMEN: No masses noted. No other overt distention noted.   PELVIC: Normal appearing external genitalia; normal urethral meatus; normal appearing vaginal mucosa and cervix.  Scant amount of dark red bloody discharge noted, no active bleeding. Declined infection screen.  Normal uterine size, no other palpable masses, no uterine or adnexal tenderness. Performed in the presence of a chaperone     Assessment and Plan:     1. Abnormal uterine bleeding (AUB) 2. Fibroids 3. Iron deficiency anemia, unspecified iron deficiency anemia type Likely combination of perimenopausal bleeding and fibroid effects.  Will get ultrasound to evaluate fibroids. Will also check CBC, iron labs given symptoms and history of anemia. [place] will be checked too (patient desires this).  Megace refill given, she was told to take as instructed. Bleeding precautions reviewed, will reevaluate in one month.  - US PELVIC COMPLETE WITH TRANSVAGINAL; Future - CBC - Iron, TIBC and Ferritin Panel - Follicle stimulating hormone - megestrol (MEGACE) 40 MG tablet; Take 2 tablets (80 mg total) by mouth daily. Can increase to 80 mg twice a day for heavy bleeding  Dispense: 60 tablet; Refill: 5  Routine preventative health maintenance measures emphasized; she is up to date with pap smear and screening mammogram ordered.  Please refer to After Visit Summary for other counseling recommendations.   Return in 1 month (on 08/28/2021) for Followup AUB.    I spent 30 minutes dedicated to the care of this patient including pre-visit review of records, face to face time with the patient discussing her conditions and treatments and post visit orders.    Verita Schneiders, MD, [place] for [employer], [place]

## 2021-08-01 DIAGNOSIS — Z419 Encounter for procedure for purposes other than remedying health state, unspecified: Secondary | ICD-10-CM | POA: Diagnosis not present

## 2021-08-01 LAB — FOLLICLE STIMULATING HORMONE: FSH: 18.1 m[IU]/mL

## 2021-08-06 ENCOUNTER — Ambulatory Visit
Admission: RE | Admit: 2021-08-06 | Discharge: 2021-08-06 | Disposition: A | Discharge status: Home / Self Care | Payer: Medicaid Other | Source: Ambulatory Visit | Attending: Obstetrics & Gynecology | Admitting: Obstetrics & Gynecology

## 2021-08-06 ENCOUNTER — Other Ambulatory Visit: Payer: Self-pay

## 2021-08-06 DIAGNOSIS — D259 Leiomyoma of uterus, unspecified: Secondary | ICD-10-CM | POA: Diagnosis not present

## 2021-08-06 DIAGNOSIS — D219 Benign neoplasm of connective and other soft tissue, unspecified: Secondary | ICD-10-CM

## 2021-08-06 DIAGNOSIS — I2510 Atherosclerotic heart disease of native coronary artery without angina pectoris: Secondary | ICD-10-CM | POA: Diagnosis not present

## 2021-08-06 DIAGNOSIS — N939 Abnormal uterine and vaginal bleeding, unspecified: Secondary | ICD-10-CM

## 2021-08-07 LAB — CBC
Hematocrit: 38.7 % (ref 34.0–46.6)
Hemoglobin: 12.5 g/dL (ref 11.1–15.9)
MCH: 24.3 pg — ABNORMAL LOW (ref 26.6–33.0)
MCHC: 32.3 g/dL (ref 31.5–35.7)
MCV: 75 fL — ABNORMAL LOW (ref 79–97)
Platelets: 507 10*3/uL — ABNORMAL HIGH (ref 150–450)
RBC: 5.14 x10E6/uL (ref 3.77–5.28)
RDW: 15.9 % — ABNORMAL HIGH (ref 11.7–15.4)
WBC: 12.7 10*3/uL — ABNORMAL HIGH (ref 3.4–10.8)

## 2021-08-07 LAB — IRON,TIBC AND FERRITIN PANEL
Ferritin: 24 ng/mL (ref 15–150)
Iron Saturation: 11 % — ABNORMAL LOW (ref 15–55)
Iron: 38 ug/dL (ref 27–159)
Total Iron Binding Capacity: 349 ug/dL (ref 250–450)
UIBC: 311 ug/dL (ref 131–425)

## 2021-08-29 ENCOUNTER — Ambulatory Visit: Payer: Medicaid Other | Admitting: Family Medicine

## 2021-09-01 DIAGNOSIS — Z419 Encounter for procedure for purposes other than remedying health state, unspecified: Secondary | ICD-10-CM | POA: Diagnosis not present

## 2021-09-04 ENCOUNTER — Ambulatory Visit: Payer: Medicaid Other | Admitting: Obstetrics & Gynecology

## 2021-09-07 ENCOUNTER — Ambulatory Visit
Admission: RE | Admit: 2021-09-07 | Discharge: 2021-09-07 | Disposition: A | Discharge status: Home / Self Care | Payer: Medicaid Other | Source: Ambulatory Visit | Attending: Obstetrics & Gynecology | Admitting: Obstetrics & Gynecology

## 2021-09-07 DIAGNOSIS — Z1231 Encounter for screening mammogram for malignant neoplasm of breast: Secondary | ICD-10-CM | POA: Diagnosis not present

## 2021-09-10 ENCOUNTER — Other Ambulatory Visit: Payer: Self-pay | Admitting: Obstetrics & Gynecology

## 2021-09-10 DIAGNOSIS — R928 Other abnormal and inconclusive findings on diagnostic imaging of breast: Secondary | ICD-10-CM

## 2021-09-10 DIAGNOSIS — N6489 Other specified disorders of breast: Secondary | ICD-10-CM

## 2021-10-01 DIAGNOSIS — Z419 Encounter for procedure for purposes other than remedying health state, unspecified: Secondary | ICD-10-CM | POA: Diagnosis not present

## 2021-10-05 ENCOUNTER — Ambulatory Visit
Admission: RE | Admit: 2021-10-05 | Discharge: 2021-10-05 | Disposition: A | Discharge status: Home / Self Care | Payer: Medicaid Other | Source: Ambulatory Visit | Attending: Obstetrics & Gynecology | Admitting: Obstetrics & Gynecology

## 2021-10-05 DIAGNOSIS — N6489 Other specified disorders of breast: Secondary | ICD-10-CM

## 2021-10-05 DIAGNOSIS — R928 Other abnormal and inconclusive findings on diagnostic imaging of breast: Secondary | ICD-10-CM

## 2021-10-05 DIAGNOSIS — R921 Mammographic calcification found on diagnostic imaging of breast: Secondary | ICD-10-CM | POA: Diagnosis not present

## 2021-10-08 ENCOUNTER — Other Ambulatory Visit: Payer: Self-pay | Admitting: Obstetrics & Gynecology

## 2021-10-08 DIAGNOSIS — R928 Other abnormal and inconclusive findings on diagnostic imaging of breast: Secondary | ICD-10-CM

## 2021-10-08 DIAGNOSIS — R590 Localized enlarged lymph nodes: Secondary | ICD-10-CM

## 2021-10-11 ENCOUNTER — Other Ambulatory Visit: Payer: Self-pay | Admitting: Obstetrics & Gynecology

## 2021-10-11 DIAGNOSIS — R590 Localized enlarged lymph nodes: Secondary | ICD-10-CM

## 2021-10-11 DIAGNOSIS — R928 Other abnormal and inconclusive findings on diagnostic imaging of breast: Secondary | ICD-10-CM

## 2021-10-19 ENCOUNTER — Ambulatory Visit
Admission: RE | Admit: 2021-10-19 | Discharge: 2021-10-19 | Disposition: A | Discharge status: Home / Self Care | Payer: Medicaid Other | Source: Ambulatory Visit | Attending: Obstetrics & Gynecology | Admitting: Obstetrics & Gynecology

## 2021-10-19 DIAGNOSIS — R590 Localized enlarged lymph nodes: Secondary | ICD-10-CM

## 2021-10-19 DIAGNOSIS — R928 Other abnormal and inconclusive findings on diagnostic imaging of breast: Secondary | ICD-10-CM | POA: Diagnosis not present

## 2021-10-22 ENCOUNTER — Encounter: Payer: Self-pay | Admitting: Body Imaging

## 2021-10-22 LAB — SURGICAL PATHOLOGY

## 2021-10-24 DIAGNOSIS — Z79899 Other long term (current) drug therapy: Secondary | ICD-10-CM | POA: Diagnosis not present

## 2021-10-24 DIAGNOSIS — M059 Rheumatoid arthritis with rheumatoid factor, unspecified: Secondary | ICD-10-CM | POA: Diagnosis not present

## 2021-11-01 DIAGNOSIS — Z419 Encounter for procedure for purposes other than remedying health state, unspecified: Secondary | ICD-10-CM | POA: Diagnosis not present

## 2021-11-24 ENCOUNTER — Encounter: Payer: Self-pay | Admitting: *Deleted

## 2021-12-01 DIAGNOSIS — Z419 Encounter for procedure for purposes other than remedying health state, unspecified: Secondary | ICD-10-CM | POA: Diagnosis not present

## 2022-01-01 DIAGNOSIS — Z419 Encounter for procedure for purposes other than remedying health state, unspecified: Secondary | ICD-10-CM | POA: Diagnosis not present

## 2022-02-01 DIAGNOSIS — Z419 Encounter for procedure for purposes other than remedying health state, unspecified: Secondary | ICD-10-CM | POA: Diagnosis not present

## 2022-03-03 DIAGNOSIS — Z419 Encounter for procedure for purposes other than remedying health state, unspecified: Secondary | ICD-10-CM | POA: Diagnosis not present

## 2022-04-03 DIAGNOSIS — Z419 Encounter for procedure for purposes other than remedying health state, unspecified: Secondary | ICD-10-CM | POA: Diagnosis not present

## 2022-04-04 DIAGNOSIS — M059 Rheumatoid arthritis with rheumatoid factor, unspecified: Secondary | ICD-10-CM | POA: Diagnosis not present

## 2022-04-04 DIAGNOSIS — Z79899 Other long term (current) drug therapy: Secondary | ICD-10-CM | POA: Diagnosis not present

## 2022-05-03 DIAGNOSIS — Z419 Encounter for procedure for purposes other than remedying health state, unspecified: Secondary | ICD-10-CM | POA: Diagnosis not present

## 2022-05-15 DIAGNOSIS — M059 Rheumatoid arthritis with rheumatoid factor, unspecified: Secondary | ICD-10-CM | POA: Diagnosis not present

## 2022-05-29 DIAGNOSIS — H5213 Myopia, bilateral: Secondary | ICD-10-CM | POA: Diagnosis not present

## 2022-06-03 DIAGNOSIS — Z419 Encounter for procedure for purposes other than remedying health state, unspecified: Secondary | ICD-10-CM | POA: Diagnosis not present

## 2022-07-04 DIAGNOSIS — Z419 Encounter for procedure for purposes other than remedying health state, unspecified: Secondary | ICD-10-CM | POA: Diagnosis not present

## 2022-07-12 DIAGNOSIS — M0579 Rheumatoid arthritis with rheumatoid factor of multiple sites without organ or systems involvement: Secondary | ICD-10-CM | POA: Diagnosis not present

## 2022-07-12 DIAGNOSIS — Z79899 Other long term (current) drug therapy: Secondary | ICD-10-CM | POA: Diagnosis not present

## 2022-07-12 DIAGNOSIS — R053 Chronic cough: Secondary | ICD-10-CM | POA: Diagnosis not present

## 2022-07-15 ENCOUNTER — Other Ambulatory Visit: Payer: Self-pay

## 2022-07-15 DIAGNOSIS — I2510 Atherosclerotic heart disease of native coronary artery without angina pectoris: Secondary | ICD-10-CM

## 2022-07-15 DIAGNOSIS — E785 Hyperlipidemia, unspecified: Secondary | ICD-10-CM

## 2022-07-15 DIAGNOSIS — R051 Acute cough: Secondary | ICD-10-CM

## 2022-07-15 MED ORDER — METOPROLOL TARTRATE 25 MG PO TABS: 12.5000 mg | ORAL_TABLET | Freq: Two times a day (BID) | ORAL | 1 refills | Status: AC

## 2022-07-15 MED ORDER — ALBUTEROL SULFATE HFA 108 (90 BASE) MCG/ACT IN AERS: 1.0000 | INHALATION_SPRAY | Freq: Four times a day (QID) | RESPIRATORY_TRACT | 0 refills | Status: DC | PRN

## 2022-07-15 MED ORDER — ATORVASTATIN CALCIUM 40 MG PO TABS: 40.0000 mg | ORAL_TABLET | Freq: Every day | ORAL | 11 refills | Status: DC

## 2022-07-23 NOTE — Telephone Encounter (Signed)
Spoke to patient she will call us back to schedule a fu visit so she can continue to get her refills.

## 2022-08-02 DIAGNOSIS — Z419 Encounter for procedure for purposes other than remedying health state, unspecified: Secondary | ICD-10-CM | POA: Diagnosis not present

## 2022-09-02 DIAGNOSIS — Z419 Encounter for procedure for purposes other than remedying health state, unspecified: Secondary | ICD-10-CM | POA: Diagnosis not present

## 2022-09-02 NOTE — Therapy (Unsigned)
OUTPATIENT PHYSICAL THERAPY EVALUATION   Patient Name: Abigail Garcia MRN: [id number] DOB:[birth year], 52 y.o., female Today's Date: 09/04/2022  END OF SESSION:  PT End of Session - 09/04/22 1114     Visit Number 1    Number of Visits 12    Date for PT Re-Evaluation 11/27/22    Authorization Type [place] MEDICAID [employer]LL[employer] reporting period from 09/04/2022    Authorization - Visit Number 1    Authorization - Number of Visits 1    Progress Note Due on Visit 10    PT Start Time 0950    PT Stop Time 1030    PT Time Calculation (min) 40 min    Activity Tolerance Patient tolerated treatment well    Behavior During Therapy [facility] for tasks assessed/performed   sleepy            Past Medical History:  Diagnosis Date   Anemia    CAD (coronary artery disease)    a. 11/2012 NSTEMI/Cath/PCI: LM nl, LAD 80-90 thrombotic (tx with Heparin x 2 days then aspiration thrombectomy and PTCA), RI nl, LCX sm/nl, RCA nl, EF 55-65%.   Esophagitis    grade 1   Hx of echocardiogram    a. Echo (6/14) with EF 55-60%.    Menorrhagia    NSTEMI (non-ST elevated myocardial infarction)    11/2012    Obesity    RA (rheumatoid arthritis)    Past Surgical History:  Procedure Laterality Date   bilateral tubal     CARDIAC CATHETERIZATION  2014   CESAREAN SECTION     CORONARY ANGIOGRAM  11/19/2012   Procedure: CORONARY ANGIOGRAM;  Surgeon: Peter M Martinique, MD;  Location: [facility] CATH LAB;  Service: Cardiovascular;;   DILATION AND CURETTAGE OF UTERUS     DILATION AND CURETTAGE OF UTERUS N/A 03/13/2019   Procedure: DILATATION AND CURETTAGE;  Surgeon: Emily Filbert, MD;  Location: [place];  Service: Gynecology;  Laterality: N/A;   INTRAVASCULAR ULTRASOUND  11/19/2012   Procedure: INTRAVASCULAR ULTRASOUND;  Surgeon: Peter M Martinique, MD;  Location: [facility] CATH LAB;  Service: Cardiovascular;;   LEFT HEART CATHETERIZATION WITH CORONARY ANGIOGRAM N/A 11/16/2012   Procedure: LEFT HEART CATHETERIZATION WITH CORONARY ANGIOGRAM;  Surgeon: Peter M  Martinique, MD;  Location: [facility] CATH LAB;  Service: Cardiovascular;  Laterality: N/A;   PERCUTANEOUS CORONARY INTERVENTION-BALLOON ONLY  11/19/2012   Procedure: PERCUTANEOUS CORONARY INTERVENTION-BALLOON ONLY;  Surgeon: Peter M Martinique, MD;  Location: [facility] CATH LAB;  Service: Cardiovascular;;   Patient Active Problem List   Diagnosis Date Noted   Epidermal cyst 04/24/2021   Specimen satisfactory for evaluation but limited by absence of endocervical or transformation zone component from patient with cervix 09/30/2020   Symptomatic anemia 03/12/2019   Hirsutism 04/17/2017   Prediabetes 08/18/2015   Blurry vision, bilateral 08/18/2015   Abnormal uterine bleeding (AUB) 08/11/2015   Obesity 11/11/2013   Thrombocytosis 08/31/2013   Ganglion cyst of wrist 06/12/2013   Hyperlipidemia 12/03/2012   CAD S/P percutaneous coronary angioplasty 11/30/2012   Rheumatoid arthritis flare 11/14/2012   Anemia, iron deficiency 11/14/2012    PCP: no PCP  REFERRING PROVIDER: Defoor, Satira Anis, PA-C (Rheumatology)  REFERRING DIAG: rheumatoid arthritis involving multiple sites with positive rheumatoid factor  Rationale for Evaluation and Treatment: Rehabilitation  THERAPY DIAG:  Difficulty in walking, not elsewhere classified  Muscle weakness (generalized)  Cervicalgia  Pain in left elbow  Chronic pain of left knee  Pain in right ankle and joints of right foot  ONSET DATE:  chronic   SUBJECTIVE:                                                                                                                                                                                           SUBJECTIVE STATEMENT: Patient states she has had rheumatoid arthritis for 26 years and it affects her joints in her R ankle and foot joints, hands, and left elbow, left knee, left neck and jaw.. She states her doctor prescribed PT to help her with whatever benefits it can. She states her MD wanted her to try therapy to help avoid  any further harm from having RA. She does not use or have an assistive device at baseline. Chart review describes active severe Stage 3 rheumatoid arthritis with positive rheumatoid factor. She was a chronic prednisone user, but is now off off it. Bone density normal 07/2021. Patient is right handed. She reports difficulty moving around, doing her daily tasks such as working, shopping, going up and down stairs, using her hands, dancing, spending time with grandchildren, getting up and down from the floor, keeping her balance.   PERTINENT HISTORY:  Patient is a 52 y.o. female who presents to outpatient physical therapy with a referral for medical diagnosis rheumatoid arthritis involving multiple sites with positive rheumatoid factor. This patient's chief complaints consist of pain in her right ankle/foot, left knee, left elbow, left neck and jaw as well as generalized discomfort and difficulty with mobility and balance leading to the following functional deficits: difficulty moving around, doing her daily tasks such as working, shopping, going up and down stairs, using her hands, dancing, spending time with grandchildren, getting up and down from the floor, keeping her balance. 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  Patient denies hx of cancer, stroke, seizures, lung problems, diabetes, unexplained weight loss, unexplained changes in bowel or bladder problems, osteoporosis, and spinal surgery  PAIN:  Are you having pain? Yes: NPRS scale: Current: 2/10,  Best: 0/10, Worst: 7-8/10. Pain location: both hands, left elbow, left neck and jaw, right ankle and foot, left knee.  Pain description: achy, denies numbness and tingling.  Aggravating factors: varies, simple things like stress, diet, sudden movements, weather, if she does too much walking.   Relieving factors: lying down, take medications.    FUNCTIONAL LIMITATIONS: difficulty moving around, doing her daily tasks such as working, shopping, going up and down stairs, using her hands, dancing, spending time with grandchildren, getting up and down from the floor, keeping her balance.   LEISURE: listen  to music, dancing, spending time with grandkids.  PRECAUTIONS: None  WEIGHT BEARING RESTRICTIONS: No  FALLS:  Has patient fallen in last 6 months? Yes. Number of falls [address] down the stairs at [employer] (may have missed a step) and fell on vacation in November (may have missed a step).  She is worried about falling again.   LIVING ENVIRONMENT: Lives with: children (17 years old).  Lives in: 2 story home, bedroom on 2nd story.  Stairs: 1 flight to get to 2nd floor with handrail on the left; no steps to enter home.  Has following equipment at home: None  OCCUPATION: Full time Tax professional (computer and paperwork).    PLOF: Independent  PATIENT GOALS: to get whatever benefits I can get that PT has to offer.    OBJECTIVE  DIAGNOSTIC FINDINGS:  Bone density normal 07/2021  SELF- REPORTED FUNCTION FOTO score: 40/100 (NOC-Neuromuscular disorder questionnaire)  OBSERVATION/INSPECTION Posture Posture: forward head, rounded shoulders, increased thoracic kyphosis and lumbar lordosis.  Anthropometrics Tremor: none Body composition: BMI: 46.1 Muscle bulk: appears grossly WFL Edema: present at bilateral ankles Functional Mobility Bed mobility: supine <> sit and rolling mod I for increased time/effort Transfers: sit <> stand mod I for increased time/effort Gait: ambulates in clinic and to parking lot with no AD, but slow speed, wide base of support, decreased step length, antalgic gait favoring R LE or L LE at different times.  Stairs: ascends four 6-inch steps with B UE using step over step gait pattern, slow and careful with compensations to avoid loading knee.  Descends four 6 inch steps with B UE support initially with step together gait, then step over step with difficulty controlling eccentric phase, slow, careful, and with compensations to decreased load on B knees.    PERIPHERAL JOINT MOTION (in degrees)  ACTIVE RANGE OF MOTION (AROM) 09/04/2022: patient's B UE AROM WFL for basic mobility except restricted in left elbow flexion < extension, B wrist motion is highly limited R > L and unable to extend right 4th and 5th digits. Able to make fists in both hands.   PASSIVE RANGE OF MOTION (PROM) 09/04/2022: B LE appear grossly [facility] for basic mobility except limitations in B hip extension and R ankle painful and globally more limited than left.   MUSCLE PERFORMANCE (MMT):  *Indicates pain 09/04/22 Date Date  Joint/Motion R/L R/L R/L  Shoulder     Flexion 5/5 / /  Abduction (C5) 5/5 / /  External rotation 3+/3+ / /  Internal rotation 4+/3+* / /  Elbow     Flexion (C6) 4/3+* / /  Extension (C7) 4+/3+* / /  Hand     Thumb extension (C8) 3/3+ / /  Finger abduction (T1) /3 / /  Grip (C8) / / /  Hip     Flexion (L1, L2) 4+/4 / /  Abduction 4/4 / /  Knee     Extension (L3) 5/5+* / /  Flexion (S2) 4/5* / /  Ankle/Foot     Dorsiflexion (L4) 4+*/5 / /  Great toe extension (L5) 4+/4+ / /  Eversion (S1) 4+*/5 / /  Plantarflexion (S1) 4*/5 / /  Comments:  No 09/04/2022: R 4th and 5th fingers 0/5 MMT for abduction.  FUNCTIONAL/BALANCE TESTS: Five Time Sit to Stand (5TSTS): 15 seconds from 18.5 inch plinth with no UE support or loss of balance Ten meter walking trial (10MWT): self selected pace: 0.93 m/s; fast pace: 0.99 m/s. No AD.  Floor Transfer Test: 24  seconds with use of chair.    TODAY'S TREATMENT:    education   PATIENT EDUCATION:  Education details: Education on diagnosis, prognosis, POC, anatomy and physiology of current condition. Education on purpose of PT. Aquatic therapy vs land options and benefits. Reviewed cancelation/no-show  policy with patient and confirmed patient has correct phone number for clinic; patient verbalized understanding (09/04/22). Person educated: Patient Education method: Theatre stage manager Education comprehension: verbalized understanding and needs further education  HOME EXERCISE PROGRAM: TBD  ASSESSMENT:  CLINICAL IMPRESSION: Patient is a 52 y.o. female referred to outpatient physical therapy with a medical diagnosis of rheumatoid arthritis involving multiple sites with positive rheumatoid factor who presents with signs and symptoms consistent with multi-joint pain and mobility/balance deficits related to rheumatoid arthritis. Patient would benefit from more specific examination of neck pain and balance at future sessions. Patient presents with significant pain, ROM, joint stiffness, posture, muscle performance (strength/power/endurance), gait, balance, muscle tension, and activity tolerance impairments that are limiting ability to complete activities such as moving around, doing her daily tasks such as working, shopping, going up and down stairs, using her hands, dancing, spending time with grandchildren, getting up and down from the floor, keeping her balance without difficulty. She would benefit from the buoyancy and viscosity of water and is a good candidate for aquatic therapy. Patient will benefit from skilled physical therapy intervention to address current body structure impairments and activity limitations to improve function and work towards goals set in current POC in order to return to prior level of function or maximal functional improvement.    OBJECTIVE IMPAIRMENTS: Abnormal gait, decreased activity tolerance, decreased balance, decreased endurance, decreased knowledge of condition, decreased knowledge of use of DME, decreased mobility, difficulty walking, decreased ROM, decreased strength, hypomobility, increased edema, impaired perceived functional ability, increased muscle  spasms, impaired flexibility, impaired UE functional use, improper body mechanics, postural dysfunction, obesity, and pain.   ACTIVITY LIMITATIONS: carrying, lifting, bending, standing, squatting, stairs, transfers, bed mobility, dressing, reach over head, hygiene/grooming, and locomotion level  PARTICIPATION LIMITATIONS: interpersonal relationship, community activity, occupation, and   difficulty moving around, doing her daily tasks such as working, shopping, going up and down stairs, using her hands, dancing, spending time with grandchildren, getting up and down from the floor, keeping her balance  PERSONAL FACTORS: Fitness, Past/current experiences, Profession, Time since onset of injury/illness/exacerbation, Transportation, and 3+ comorbidities:   anemia, CAD, esophagitis, menorrhagia, NSTEMI (and cardiac cath 2014), active severe Stage 3 rheumatoid arthritis with positive rheumatoid factor are also affecting patient's functional outcome.   REHAB POTENTIAL: Good  CLINICAL DECISION MAKING: Evolving/moderate complexity  EVALUATION COMPLEXITY: Moderate   GOALS: Goals reviewed with patient? No  SHORT TERM GOALS: Target date: 09/18/2022  Patient will be independent with initial home exercise program for self-management of symptoms. Baseline: Initial HEP to be provided at visit 2 as appropriate (09/04/22); Goal status: INITIAL   LONG TERM GOALS: Target date: 11/27/2022  Patient will be independent with a long-term home exercise program for self-management of symptoms.  Baseline: Initial HEP to be provided at visit 2 as appropriate (09/04/22); Goal status: INITIAL  2.  Patient will demonstrate improved FOTO to equal or greater than 40 by visit #12 to demonstrate improvement in overall condition and self-reported functional ability.  Baseline: 50 (09/04/22); Goal status: INITIAL  3.  Patient will complete floor transfer test in equal or less than 8.8 seconds to demonstrate improved  ability to get up and down from the floor so she can play  with her grandchildren.  Baseline: 24 seconds (09/04/22); Goal status: INITIAL  4.  Patient will ambulate equal or greater than 1.2 m/s in the 10 Meter Walking Trial to improve her safety when walking across an intersection. Baseline: 0.99 m/s (09/04/22); Goal status: INITIAL  5.  Patient will report decreased pain to equal or less than 3/10 during functional activities to improve her ability to work, and do leisure activities.  Baseline: up to 8/10 (09/04/22); Goal status: INITIAL  6.  Patient will ascend and descend four 6 inch steps using step over step technique with no evidence of lack of control and no use of B UE to improve her ability to use stairs at her home while carrying items and with decreased fall risk.  Baseline: needs B UE support and lacks eccentric control (09/04/2022);  Goal status: INITIAL   PLAN:  PT FREQUENCY: 1-2x/week  PT DURATION: 12 weeks  PLANNED INTERVENTIONS: Therapeutic exercises, Therapeutic activity, Neuromuscular re-education, Balance training, Gait training, Patient/Family education, Self Care, Joint mobilization, Stair training, DME instructions, Aquatic Therapy, Dry Needling, Electrical stimulation, Spinal mobilization, Cryotherapy, Moist heat, Manual therapy, and Re-evaluation.  PLAN FOR NEXT SESSION: update HEP as appropriate, further evaluate neck and balance deficits as appropriate, progressive posture/LE/UE/core/functional strength and ROM exercises and balance exercises. Manual therapy and dry needling as needed. Education.    Everlean Alstrom. Graylon Good, PT, DPT 09/04/22, 11:42 AM  [facility] [address] [place], [place] [address] P: [phone] I F: [phone]

## 2022-09-04 ENCOUNTER — Encounter: Payer: Self-pay | Admitting: Physical Therapy

## 2022-09-04 ENCOUNTER — Ambulatory Visit: Payer: Medicaid Other | Attending: Rheumatology | Admitting: Physical Therapy

## 2022-09-04 DIAGNOSIS — M6281 Muscle weakness (generalized): Secondary | ICD-10-CM | POA: Diagnosis not present

## 2022-09-04 DIAGNOSIS — M25522 Pain in left elbow: Secondary | ICD-10-CM | POA: Insufficient documentation

## 2022-09-04 DIAGNOSIS — G8929 Other chronic pain: Secondary | ICD-10-CM | POA: Diagnosis not present

## 2022-09-04 DIAGNOSIS — M25562 Pain in left knee: Secondary | ICD-10-CM | POA: Diagnosis not present

## 2022-09-04 DIAGNOSIS — M542 Cervicalgia: Secondary | ICD-10-CM | POA: Diagnosis not present

## 2022-09-04 DIAGNOSIS — R262 Difficulty in walking, not elsewhere classified: Secondary | ICD-10-CM

## 2022-09-04 DIAGNOSIS — M25571 Pain in right ankle and joints of right foot: Secondary | ICD-10-CM | POA: Diagnosis not present

## 2022-09-17 ENCOUNTER — Ambulatory Visit: Payer: Medicaid Other | Admitting: Physical Therapy

## 2022-09-17 ENCOUNTER — Encounter: Payer: Self-pay | Admitting: Physical Therapy

## 2022-09-17 VITALS — BP 125/76 | HR 87

## 2022-09-17 DIAGNOSIS — M25571 Pain in right ankle and joints of right foot: Secondary | ICD-10-CM

## 2022-09-17 DIAGNOSIS — M6281 Muscle weakness (generalized): Secondary | ICD-10-CM

## 2022-09-17 DIAGNOSIS — M25522 Pain in left elbow: Secondary | ICD-10-CM | POA: Diagnosis not present

## 2022-09-17 DIAGNOSIS — M542 Cervicalgia: Secondary | ICD-10-CM

## 2022-09-17 DIAGNOSIS — R262 Difficulty in walking, not elsewhere classified: Secondary | ICD-10-CM

## 2022-09-17 DIAGNOSIS — Z7689 Persons encountering health services in other specified circumstances: Secondary | ICD-10-CM | POA: Diagnosis not present

## 2022-09-17 DIAGNOSIS — G8929 Other chronic pain: Secondary | ICD-10-CM

## 2022-09-17 DIAGNOSIS — M25562 Pain in left knee: Secondary | ICD-10-CM | POA: Diagnosis not present

## 2022-09-17 NOTE — Therapy (Signed)
OUTPATIENT PHYSICAL THERAPY TREATMENT NOTE   Patient Name: Abigail Garcia MRN: [id number] DOB:September 06, 1970, 52 y.o., female Today's Date: 09/17/2022  PCP: no PCP  REFERRING PROVIDER: Defoor, Dionne Ano, PA-C (Rheumatology)   END OF SESSION:   PT End of Session - 09/17/22 1824     Visit Number 2    Number of Visits 12    Date for PT Re-Evaluation 11/27/22    Authorization Type [place] MEDICAID [employer] reporting period from 09/04/2022    Authorization Time Period [employer] auth#[id number] 4/15-7/30 for 12 PT visits    Authorization - Visit Number 1    Authorization - Number of Visits 12    Progress Note Due on Visit 10    PT Start Time 1818    PT Stop Time 1902    PT Time Calculation (min) 44 min    Activity Tolerance Patient tolerated treatment well    Behavior During Therapy [facility] for tasks assessed/performed   sleepy            Past Medical History:  Diagnosis Date   Anemia    CAD (coronary artery disease)    a. 11/2012 NSTEMI/Cath/PCI: LM nl, LAD 80-90 thrombotic (tx with Heparin x 2 days then aspiration thrombectomy and PTCA), RI nl, LCX sm/nl, RCA nl, EF 55-65%.   Esophagitis    grade 1   Hx of echocardiogram    a. Echo (6/14) with EF 55-60%.    Menorrhagia    NSTEMI (non-ST elevated myocardial infarction)    11/2012    Obesity    RA (rheumatoid arthritis)    Past Surgical History:  Procedure Laterality Date   bilateral tubal     CARDIAC CATHETERIZATION  2014   CESAREAN SECTION     CORONARY ANGIOGRAM  11/19/2012   Procedure: CORONARY ANGIOGRAM;  Surgeon: Peter M Swaziland, MD;  Location: [facility] CATH LAB;  Service: Cardiovascular;;   DILATION AND CURETTAGE OF UTERUS     DILATION AND CURETTAGE OF UTERUS N/A 03/13/2019   Procedure: DILATATION AND CURETTAGE;  Surgeon: Allie Bossier, MD;  Location: [facility] OR;  Service: Gynecology;  Laterality: N/A;   INTRAVASCULAR ULTRASOUND  11/19/2012   Procedure: INTRAVASCULAR ULTRASOUND;  Surgeon: Peter M Swaziland, MD;  Location: [facility] CATH LAB;  Service:  Cardiovascular;;   LEFT HEART CATHETERIZATION WITH CORONARY ANGIOGRAM N/A 11/16/2012   Procedure: LEFT HEART CATHETERIZATION WITH CORONARY ANGIOGRAM;  Surgeon: Peter M Swaziland, MD;  Location: [facility] CATH LAB;  Service: Cardiovascular;  Laterality: N/A;   PERCUTANEOUS CORONARY INTERVENTION-BALLOON ONLY  11/19/2012   Procedure: PERCUTANEOUS CORONARY INTERVENTION-BALLOON ONLY;  Surgeon: Peter M Swaziland, MD;  Location: [facility] CATH LAB;  Service: Cardiovascular;;   Patient Active Problem List   Diagnosis Date Noted   Epidermal cyst 04/24/2021   Specimen satisfactory for evaluation but limited by absence of endocervical or transformation zone component from patient with cervix 09/30/2020   Symptomatic anemia 03/12/2019   Hirsutism 04/17/2017   Prediabetes 08/18/2015   Blurry vision, bilateral 08/18/2015   Abnormal uterine bleeding (AUB) 08/11/2015   Obesity 11/11/2013   Thrombocytosis 08/31/2013   Ganglion cyst of wrist 06/12/2013   Hyperlipidemia 12/03/2012   CAD S/P percutaneous coronary angioplasty 11/30/2012   Rheumatoid arthritis flare 11/14/2012   Anemia, iron deficiency 11/14/2012    REFERRING DIAG: rheumatoid arthritis involving multiple sites with positive rheumatoid factor   THERAPY DIAG:  Difficulty in walking, not elsewhere classified  Muscle weakness (generalized)  Cervicalgia  Pain in left elbow  Chronic pain of left knee  Pain in  right ankle and joints of right foot  Rationale for Evaluation and Treatment: Rehabilitation  PERTINENT HISTORY: Patient is a 52 y.o. female who presents to outpatient physical therapy with a referral for medical diagnosis rheumatoid arthritis involving multiple sites with positive rheumatoid factor. This patient's chief complaints consist of pain in her right ankle/foot, left knee, left elbow, left neck and jaw as well as generalized discomfort and difficulty with mobility and balance leading to the following functional deficits: difficulty moving  around, doing her daily tasks such as working, shopping, going up and down stairs, using her hands, dancing, spending time with grandchildren, getting up and down from the floor, keeping her balance. 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  Patient denies hx of cancer, stroke, seizures, lung problems, diabetes, unexplained weight loss, unexplained changes in bowel or bladder problems, osteoporosis, and spinal surgery   PRECAUTIONS:  active severe Stage 3 rheumatoid arthritis with positive rheumatoid factor, fall risk.   SUBJECTIVE:                                                                                                                                                                                      SUBJECTIVE STATEMENT:  Patient reports she felt okay after last PT session. She fell on Saturday and her hands are both hurting more (L > R). She states her L knee was hurting all week but it felt fine after the fall. She tripped over something in her garden. Her son had to help her up.    PAIN:  NPRS 2-3/10 in her B hands, left knee, right ankle, and both wrists.    OBJECTIVE:   Vitals:   09/17/22 1825  BP: 125/76  Pulse: 87  SpO2: 100%     SPINE MOTION  CERVICAL SPINE AROM *Indicates pain Flexion: 40  increased pulling at B neck L > R Extension: 50 pulling at left neck Side Flexion:   R 20 left sided pull  L 12 left sided ache/sore Rotation:  R 40 left sided pull L 35 left sided pull Retraction: 0.5 inches, left sided pull  SPECIAL TESTS Cervical spine axial compression: positive for increased concodant pain at left neck like a "crick" Spurling's part B:  R = negative, L = increased left sided neck pain Cervical spine axial distraction: negative  PALPATION: TTP with concordant soreness over left upper trap  and left cervical perispinal muscles.  Mild TTP right upper trap and cervical perispinal muscles Tension to palpation in B UT and perispinal muscles (L > R)  TODAY'S TREATMENT:  Therapeutic exercise: to centralize symptoms and improve ROM, strength, muscular endurance, and activity tolerance required for successful completion of functional activities.  - vitals check to get baseline before intervention (see above).  - further exam of cervical spine - see above - demonstration with trial of a few reps with short holds of B UT stretch, cervical spine AROM rotation both directions, scapular retractoin.  - Education on HEP including handout   Manual therapy: to reduce pain and tissue tension, improve range of motion, neuromodulation, in order to promote improved ability to complete functional activities. SUPINE - STM to bilateral posterior cervical spine musculature including perispinal muscles and upper traps/levator scap  PRONE with face in cradle and pillow under ankles - STM to bilateral upper traps  Modality: (unbilled) Dry needling performed to bilateral upper traps to decrease pain and spasms along patient's neck region with patient in prone utilizing 2 dry needle(s) .30mm x 30mm with 3 sticks at each upper trap . Patient educated about the risks and benefits from therapy and verbally consents to treatment. Strong and multiple twitch response on left side.  Dry needling performed by Luretha Murphy. Ilsa Iha PT, DPT who is certified in this technique.    PATIENT EDUCATION:  Education details: Exercise purpose/form. Self management techniques. Education on HEP including handout  Reviewed cancelation/no-show policy with patient and confirmed patient has correct phone number for clinic; patient verbalized understanding (09/04/22). Person educated: Patient Education method: Explanation and Handouts Education comprehension: verbalized understanding and needs further education   HOME EXERCISE  PROGRAM: Access Code: [email or web address] URL: [email or web address] Date: 09/17/2022 Prepared by: Norton Blizzard  Exercises - Seated Upper Trapezius Stretch  - 2 x daily - 2-3 reps - 30 seconds hold - Seated Cervical Rotation AROM  - 2 x daily - 2 sets - 10 reps - Seated Scapular Retraction  - 1-2 x daily - 2 sets - 10 reps - 5 seconds hold   ASSESSMENT:   CLINICAL IMPRESSION: Patient arrives with increased soreness in her hands from a fall in her gardens since last PT session. Neck pain was further evaluated today as previously planned. Patient appears to have chronic left sided neck pain without radiculopathy but with somatic referral towards into left upper trap region. She also has significant stiffness in ROM and increased tension and tenderness to palpation in L > R upper traps and perispinal region. Manual therapy and dry needling was utilized today to help decrease tension and pain. She had a strong twitch response in the left upper trap. She was provided with HEP with some beginning exercises for her neck. Plan to further assess balance at next session and follow up on result of dry needling. Patient would benefit from continued management of limiting condition by skilled physical therapist to address remaining impairments and functional limitations to work towards stated goals and return to PLOF or maximal functional independence.   From initial PT evaluation from 09/04/2022:  Patient is a 52 y.o. female referred to outpatient physical therapy with a medical diagnosis of rheumatoid arthritis involving multiple sites with positive rheumatoid factor who presents with signs and symptoms consistent with multi-joint pain and mobility/balance deficits related to rheumatoid arthritis. Patient would benefit from more specific examination of neck pain and balance at future sessions. Patient presents with significant pain, ROM, joint stiffness, posture, muscle performance (strength/power/endurance),  gait, balance, muscle tension, and activity tolerance impairments that are limiting ability to complete activities such as moving around, doing her daily tasks such as working, shopping, going  up and down stairs, using her hands, dancing, spending time with grandchildren, getting up and down from the floor, keeping her balance without difficulty. She would benefit from the buoyancy and viscosity of water and is a good candidate for aquatic therapy. Patient will benefit from skilled physical therapy intervention to address current body structure impairments and activity limitations to improve function and work towards goals set in current POC in order to return to prior level of function or maximal functional improvement.      OBJECTIVE IMPAIRMENTS: Abnormal gait, decreased activity tolerance, decreased balance, decreased endurance, decreased knowledge of condition, decreased knowledge of use of DME, decreased mobility, difficulty walking, decreased ROM, decreased strength, hypomobility, increased edema, impaired perceived functional ability, increased muscle spasms, impaired flexibility, impaired UE functional use, improper body mechanics, postural dysfunction, obesity, and pain.    ACTIVITY LIMITATIONS: carrying, lifting, bending, standing, squatting, stairs, transfers, bed mobility, dressing, reach over head, hygiene/grooming, and locomotion level   PARTICIPATION LIMITATIONS: interpersonal relationship, community activity, occupation, and   difficulty moving around, doing her daily tasks such as working, shopping, going up and down stairs, using her hands, dancing, spending time with grandchildren, getting up and down from the floor, keeping her balance   PERSONAL FACTORS: Fitness, Past/current experiences, Profession, Time since onset of injury/illness/exacerbation, Transportation, and 3+ comorbidities:   anemia, CAD, esophagitis, menorrhagia, NSTEMI (and cardiac cath 2014), active severe Stage 3  rheumatoid arthritis with positive rheumatoid factor are also affecting patient's functional outcome.    REHAB POTENTIAL: Good   CLINICAL DECISION MAKING: Evolving/moderate complexity   EVALUATION COMPLEXITY: Moderate     GOALS: Goals reviewed with patient? No   SHORT TERM GOALS: Target date: 09/18/2022   Patient will be independent with initial home exercise program for self-management of symptoms. Baseline: Initial HEP to be provided at visit 2 as appropriate (09/04/22); Goal status: In-progress     LONG TERM GOALS: Target date: 11/27/2022   Patient will be independent with a long-term home exercise program for self-management of symptoms.  Baseline: Initial HEP to be provided at visit 2 as appropriate (09/04/22); Goal status: In-progress   2.  Patient will demonstrate improved FOTO to equal or greater than 40 by visit #12 to demonstrate improvement in overall condition and self-reported functional ability.  Baseline: 50 (09/04/22); Goal status: In-progress   3.  Patient will complete floor transfer test in equal or less than 8.8 seconds to demonstrate improved ability to get up and down from the floor so she can play with her grandchildren.  Baseline: 24 seconds (09/04/22); Goal status: In-progress   4.  Patient will ambulate equal or greater than 1.2 m/s in the 10 Meter Walking Trial to improve her safety when walking across an intersection. Baseline: 0.99 m/s (09/04/22); Goal status: In-progress   5.  Patient will report decreased pain to equal or less than 3/10 during functional activities to improve her ability to work, and do leisure activities.  Baseline: up to 8/10 (09/04/22); Goal status: In-progress   6.  Patient will ascend and descend four 6 inch steps using step over step technique with no evidence of lack of control and no use of B UE to improve her ability to use stairs at her home while carrying items and with decreased fall risk.  Baseline: needs B UE  support and lacks eccentric control (09/04/2022);  Goal status: In-progress     PLAN:   PT FREQUENCY: 1-2x/week   PT DURATION: 12 weeks   PLANNED INTERVENTIONS: Therapeutic exercises,  Therapeutic activity, Neuromuscular re-education, Balance training, Gait training, Patient/Family education, Self Care, Joint mobilization, Stair training, DME instructions, Aquatic Therapy, Dry Needling, Electrical stimulation, Spinal mobilization, Cryotherapy, Moist heat, Manual therapy, and Re-evaluation.   PLAN FOR NEXT SESSION: update HEP as appropriate, further evaluate neck and balance deficits as appropriate, progressive posture/LE/UE/core/functional strength and ROM exercises and balance exercises. Manual therapy and dry needling as needed. Education.   Cira Rue, PT, DPT 09/17/2022, 7:23 PM  [facility] [address] [place], [place] [address] P: [phone] I F: [phone]

## 2022-09-24 ENCOUNTER — Encounter: Payer: Self-pay | Admitting: Physical Therapy

## 2022-09-24 ENCOUNTER — Ambulatory Visit: Payer: Medicaid Other | Admitting: Physical Therapy

## 2022-09-24 DIAGNOSIS — Z7689 Persons encountering health services in other specified circumstances: Secondary | ICD-10-CM | POA: Diagnosis not present

## 2022-09-24 DIAGNOSIS — R262 Difficulty in walking, not elsewhere classified: Secondary | ICD-10-CM | POA: Diagnosis not present

## 2022-09-24 DIAGNOSIS — M25571 Pain in right ankle and joints of right foot: Secondary | ICD-10-CM

## 2022-09-24 DIAGNOSIS — G8929 Other chronic pain: Secondary | ICD-10-CM

## 2022-09-24 DIAGNOSIS — M6281 Muscle weakness (generalized): Secondary | ICD-10-CM | POA: Diagnosis not present

## 2022-09-24 DIAGNOSIS — M25522 Pain in left elbow: Secondary | ICD-10-CM

## 2022-09-24 DIAGNOSIS — M25562 Pain in left knee: Secondary | ICD-10-CM | POA: Diagnosis not present

## 2022-09-24 DIAGNOSIS — M542 Cervicalgia: Secondary | ICD-10-CM

## 2022-09-24 NOTE — Therapy (Signed)
OUTPATIENT PHYSICAL THERAPY TREATMENT NOTE   Patient Name: Abigail Garcia MRN: [id number] DOB:[birth year], 52 y.o., female Today's Date: 09/24/2022  PCP: no PCP  REFERRING PROVIDER: Defoor, Dionne Ano, PA-C (Rheumatology)   END OF SESSION:   PT End of Session - 09/24/22 1943     Visit Number 3    Number of Visits 12    Date for PT Re-Evaluation 11/27/22    Authorization Type  [employer] reporting period from 09/04/2022    Authorization Time Period [facility] auth#[id number] 4/15-7/30 for 12 PT visits    Authorization - Visit Number 2    Authorization - Number of Visits 12    Progress Note Due on Visit 10    PT Start Time 1647    PT Stop Time 1728    PT Time Calculation (min) 41 min    Activity Tolerance Patient tolerated treatment well    Behavior During Therapy [facility] for tasks assessed/performed   sleepy             Past Medical History:  Diagnosis Date   Anemia    CAD (coronary artery disease)    a. 11/2012 NSTEMI/Cath/PCI: LM nl, LAD 80-90 thrombotic (tx with Heparin x 2 days then aspiration thrombectomy and PTCA), RI nl, LCX sm/nl, RCA nl, EF 55-65%.   Esophagitis    grade 1   Hx of echocardiogram    a. Echo (6/14) with EF 55-60%.    Menorrhagia    NSTEMI (non-ST elevated myocardial infarction)    11/2012    Obesity    RA (rheumatoid arthritis)    Past Surgical History:  Procedure Laterality Date   bilateral tubal     CARDIAC CATHETERIZATION  2014   CESAREAN SECTION     CORONARY ANGIOGRAM  11/19/2012   Procedure: CORONARY ANGIOGRAM;  Surgeon: Peter M Swaziland, MD;  Location: [facility] CATH LAB;  Service: Cardiovascular;;   DILATION AND CURETTAGE OF UTERUS     DILATION AND CURETTAGE OF UTERUS N/A 03/13/2019   Procedure: DILATATION AND CURETTAGE;  Surgeon: Allie Bossier, MD;  Location: [facility] OR;  Service: Gynecology;  Laterality: N/A;   INTRAVASCULAR ULTRASOUND  11/19/2012   Procedure: INTRAVASCULAR ULTRASOUND;  Surgeon: Peter M Swaziland, MD;  Location: [facility] CATH LAB;   Service: Cardiovascular;;   LEFT HEART CATHETERIZATION WITH CORONARY ANGIOGRAM N/A 11/16/2012   Procedure: LEFT HEART CATHETERIZATION WITH CORONARY ANGIOGRAM;  Surgeon: Peter M Swaziland, MD;  Location: [facility] CATH LAB;  Service: Cardiovascular;  Laterality: N/A;   PERCUTANEOUS CORONARY INTERVENTION-BALLOON ONLY  11/19/2012   Procedure: PERCUTANEOUS CORONARY INTERVENTION-BALLOON ONLY;  Surgeon: Peter M Swaziland, MD;  Location: [facility] CATH LAB;  Service: Cardiovascular;;   Patient Active Problem List   Diagnosis Date Noted   Epidermal cyst 04/24/2021   Specimen satisfactory for evaluation but limited by absence of endocervical or transformation zone component from patient with cervix 09/30/2020   Symptomatic anemia 03/12/2019   Hirsutism 04/17/2017   Prediabetes 08/18/2015   Blurry vision, bilateral 08/18/2015   Abnormal uterine bleeding (AUB) 08/11/2015   Obesity 11/11/2013   Thrombocytosis 08/31/2013   Ganglion cyst of wrist 06/12/2013   Hyperlipidemia 12/03/2012   CAD S/P percutaneous coronary angioplasty 11/30/2012   Rheumatoid arthritis flare 11/14/2012   Anemia, iron deficiency 11/14/2012    REFERRING DIAG: rheumatoid arthritis involving multiple sites with positive rheumatoid factor   THERAPY DIAG:  Difficulty in walking, not elsewhere classified  Muscle weakness (generalized)  Cervicalgia  Pain in left elbow  Chronic pain of left knee  Pain  in right ankle and joints of right foot  Rationale for Evaluation and Treatment: Rehabilitation  PERTINENT HISTORY: Patient is a 52 y.o. female who presents to outpatient physical therapy with a referral for medical diagnosis rheumatoid arthritis involving multiple sites with positive rheumatoid factor. This patient's chief complaints consist of pain in her right ankle/foot, left knee, left elbow, left neck and jaw as well as generalized discomfort and difficulty with mobility and balance leading to the following functional deficits: difficulty  moving around, doing her daily tasks such as working, shopping, going up and down stairs, using her hands, dancing, spending time with grandchildren, getting up and down from the floor, keeping her balance. 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  Patient denies hx of cancer, stroke, seizures, lung problems, diabetes, unexplained weight loss, unexplained changes in bowel or bladder problems, osteoporosis, and spinal surgery   PRECAUTIONS:  active severe Stage 3 rheumatoid arthritis with positive rheumatoid factor, fall risk.   SUBJECTIVE:                                                                                                                                                                                      SUBJECTIVE STATEMENT:  Patient reports she was doing good until yesterday when she felt bodywide pain. She went on a trip over the weekend. She thought the pain was from the quick drop in temperature but then a friend who is a counselor said pain can be where we hold emotional pain. She states she is no in as much pain as yesterday but she is not 100%. Over the weekend she did not have any pain. She was a little sore in the evening after last PT session, but she felt the dry needling for her neck really helped.    PAIN:  NPRS 3/10 mostly in her left elbow, left neck.2/10 left knee.    OBJECTIVE:    [facility] PT Assessment - 09/24/22 0001       Functional Gait  Assessment   Gait assessed  Yes    Gait Level Surface Walks 20 ft in less than 7 sec but greater than 5.5 sec, uses assistive device, slower speed, mild gait deviations, or deviates 6-10 in outside of the 12 in walkway width.   self selected pace: 6.88 seconds; fast pace: 5.88 seconds   Change in Gait Speed Able to smoothly change walking speed without loss of balance or  gait deviation. Deviate no more than 6 in outside of the 12 in walkway width.    Gait with Horizontal  Head Turns Performs head turns smoothly with no change in gait. Deviates no more than 6 in outside 12 in walkway width    Gait with Vertical Head Turns Performs head turns with no change in gait. Deviates no more than 6 in outside 12 in walkway width.    Gait and Pivot Turn Pivot turns safely within 3 sec and stops quickly with no loss of balance.    Step Over Obstacle Is able to step over one shoe box (4.5 in total height) but must slow down and adjust steps to clear box safely. May require verbal cueing.    Gait with Narrow Base of Support Ambulates less than 4 steps heel to toe or cannot perform without assistance.    Gait with Eyes Closed Walks 20 ft, slow speed, abnormal gait pattern, evidence for imbalance, deviates 10-15 in outside 12 in walkway width. Requires more than 9 sec to ambulate 20 ft.   11.81 seconds   Ambulating Backwards Walks 20 ft, slow speed, abnormal gait pattern, evidence for imbalance, deviates 10-15 in outside 12 in walkway width.   26 seconds, one stumble   Steps Two feet to a stair, must use rail.   last two steps down, used step to gait; otherwise was step over step   Total Score 18    FGA comment: < 19 = high risk fall              TODAY'S TREATMENT:    Neuromuscular Re-education: to improve, balance, postural strength, muscle activation patterns, and stabilization strength required for functional activities: - Functional Gait Assessment to assess balance (see above - scored high fall risk )  Therapeutic exercise: to centralize symptoms and improve ROM, strength, muscular endurance, and activity tolerance required for successful completion of functional activities.  - NuStep level 1 using bilateral upper and lower extremities. Seat/handle setting 9/9. For improved extremity mobility, muscular endurance, and activity tolerance; and to induce the analgesic effect  of aerobic exercise, stimulate improved joint nutrition, and prepare body structures and systems for following interventions. x 5  minutes. Average SPM = 53. - prone cervical retraction, 3x10 with 5 second holds - Education on HEP including handout   Manual therapy: to reduce pain and tissue tension, improve range of motion, neuromodulation, in order to promote improved ability to complete functional activities. PRONE with face in cradle and pillow under ankles - STM to bilateral upper trap and cervical paraspinals  Modality: (unbilled) Dry needling performed to bilateral upper traps to decrease pain and spasms along patient's neck region with patient in prone utilizing 2 dry needle(s) .30mm x 30mm with 3 sticks at each upper trap . Patient educated about the risks and benefits from therapy and verbally consents to treatment. Strong and multiple twitch response on left side.  Dry needling performed by Luretha Murphy. Ilsa Iha PT, DPT who is certified in this technique.    PATIENT EDUCATION:  Education details: Exercise purpose/form. Self management techniques. Education on HEP including handout  Reviewed cancelation/no-show policy with patient and confirmed patient has correct phone number for clinic; patient verbalized understanding (09/04/22). Person educated: Patient Education method: Explanation and Handouts Education comprehension: verbalized understanding and needs further education   HOME EXERCISE PROGRAM: Access Code: [email or web address] URL: [email or web address] Date: 09/24/2022 Prepared by: Norton Blizzard  Exercises - Seated Upper Trapezius Stretch  - 2 x daily - 2-3 reps - 30 seconds hold - Seated Cervical Rotation AROM  - 2 x daily - 2 sets - 10 reps -  Seated Scapular Retraction  - 1-2 x daily - 2 sets - 10 reps - 5 seconds hold - Prone Cervical Retraction  - 1 x daily - 3 sets - 10 reps - 1-5 seconds hold   ASSESSMENT:   CLINICAL IMPRESSION: Patient arrives reporting good  tolerance to dry needling last session. Assessed balance this session using Functional Gait Analysis which suggested patient is at high risk for falls at 18/30. Educated patient on fall risk. Continued with dry needling intervention for neck pain with progressed postural strengthening and HEP. Patient to start aquatic therapy next session and would benefit from balance exercises with update of HEP to include balance exercises when safe and appropriate. Patient tolerated today's session well and reported feeling better by end of session. Patient would benefit from continued management of limiting condition by skilled physical therapist to address remaining impairments and functional limitations to work towards stated goals and return to PLOF or maximal functional independence.   From initial PT evaluation from 09/04/2022:  Patient is a 52 y.o. female referred to outpatient physical therapy with a medical diagnosis of rheumatoid arthritis involving multiple sites with positive rheumatoid factor who presents with signs and symptoms consistent with multi-joint pain and mobility/balance deficits related to rheumatoid arthritis. Patient would benefit from more specific examination of neck pain and balance at future sessions. Patient presents with significant pain, ROM, joint stiffness, posture, muscle performance (strength/power/endurance), gait, balance, muscle tension, and activity tolerance impairments that are limiting ability to complete activities such as moving around, doing her daily tasks such as working, shopping, going up and down stairs, using her hands, dancing, spending time with grandchildren, getting up and down from the floor, keeping her balance without difficulty. She would benefit from the buoyancy and viscosity of water and is a good candidate for aquatic therapy. Patient will benefit from skilled physical therapy intervention to address current body structure impairments and activity limitations to  improve function and work towards goals set in current POC in order to return to prior level of function or maximal functional improvement.      OBJECTIVE IMPAIRMENTS: Abnormal gait, decreased activity tolerance, decreased balance, decreased endurance, decreased knowledge of condition, decreased knowledge of use of DME, decreased mobility, difficulty walking, decreased ROM, decreased strength, hypomobility, increased edema, impaired perceived functional ability, increased muscle spasms, impaired flexibility, impaired UE functional use, improper body mechanics, postural dysfunction, obesity, and pain.    ACTIVITY LIMITATIONS: carrying, lifting, bending, standing, squatting, stairs, transfers, bed mobility, dressing, reach over head, hygiene/grooming, and locomotion level   PARTICIPATION LIMITATIONS: interpersonal relationship, community activity, occupation, and   difficulty moving around, doing her daily tasks such as working, shopping, going up and down stairs, using her hands, dancing, spending time with grandchildren, getting up and down from the floor, keeping her balance   PERSONAL FACTORS: Fitness, Past/current experiences, Profession, Time since onset of injury/illness/exacerbation, Transportation, and 3+ comorbidities:   anemia, CAD, esophagitis, menorrhagia, NSTEMI (and cardiac cath 2014), active severe Stage 3 rheumatoid arthritis with positive rheumatoid factor are also affecting patient's functional outcome.    REHAB POTENTIAL: Good   CLINICAL DECISION MAKING: Evolving/moderate complexity   EVALUATION COMPLEXITY: Moderate     GOALS: Goals reviewed with patient? No   SHORT TERM GOALS: Target date: 09/18/2022   Patient will be independent with initial home exercise program for self-management of symptoms. Baseline: Initial HEP to be provided at visit 2 as appropriate (09/04/22); Goal status: In-progress     LONG TERM GOALS: Target date: 11/27/2022  Patient will be independent  with a long-term home exercise program for self-management of symptoms.  Baseline: Initial HEP to be provided at visit 2 as appropriate (09/04/22); Goal status: In-progress   2.  Patient will demonstrate improved FOTO to equal or greater than 40 by visit #12 to demonstrate improvement in overall condition and self-reported functional ability.  Baseline: 50 (09/04/22); Goal status: In-progress   3.  Patient will complete floor transfer test in equal or less than 8.8 seconds to demonstrate improved ability to get up and down from the floor so she can play with her grandchildren.  Baseline: 24 seconds (09/04/22); Goal status: In-progress   4.  Patient will ambulate equal or greater than 1.2 m/s in the 10 Meter Walking Trial to improve her safety when walking across an intersection. Baseline: 0.99 m/s (09/04/22); Goal status: In-progress   5.  Patient will report decreased pain to equal or less than 3/10 during functional activities to improve her ability to work, and do leisure activities.  Baseline: up to 8/10 (09/04/22); Goal status: In-progress   6.  Patient will ascend and descend four 6 inch steps using step over step technique with no evidence of lack of control and no use of B UE to improve her ability to use stairs at her home while carrying items and with decreased fall risk.  Baseline: needs B UE support and lacks eccentric control (09/04/2022);  Goal status: In-progress     PLAN:   PT FREQUENCY: 1-2x/week   PT DURATION: 12 weeks   PLANNED INTERVENTIONS: Therapeutic exercises, Therapeutic activity, Neuromuscular re-education, Balance training, Gait training, Patient/Family education, Self Care, Joint mobilization, Stair training, DME instructions, Aquatic Therapy, Dry Needling, Electrical stimulation, Spinal mobilization, Cryotherapy, Moist heat, Manual therapy, and Re-evaluation.   PLAN FOR NEXT SESSION: aquatic therapy, update HEP as appropriate, progressive  posture/LE/UE/core/functional strength and ROM exercises and balance exercises as appropriate. Education.   Cira Rue, PT, DPT 09/24/2022, 7:49 PM  [facility] [address] [place], [place] [address] P: [phone] I F: [phone]

## 2022-09-25 ENCOUNTER — Encounter (HOSPITAL_BASED_OUTPATIENT_CLINIC_OR_DEPARTMENT_OTHER): Payer: Self-pay | Admitting: Physical Therapy

## 2022-09-25 ENCOUNTER — Ambulatory Visit (HOSPITAL_BASED_OUTPATIENT_CLINIC_OR_DEPARTMENT_OTHER): Payer: Medicaid Other | Attending: Rheumatology | Admitting: Physical Therapy

## 2022-09-25 DIAGNOSIS — M542 Cervicalgia: Secondary | ICD-10-CM | POA: Insufficient documentation

## 2022-09-25 DIAGNOSIS — M25571 Pain in right ankle and joints of right foot: Secondary | ICD-10-CM | POA: Insufficient documentation

## 2022-09-25 DIAGNOSIS — M6281 Muscle weakness (generalized): Secondary | ICD-10-CM | POA: Insufficient documentation

## 2022-09-25 DIAGNOSIS — M25562 Pain in left knee: Secondary | ICD-10-CM | POA: Insufficient documentation

## 2022-09-25 DIAGNOSIS — R262 Difficulty in walking, not elsewhere classified: Secondary | ICD-10-CM | POA: Insufficient documentation

## 2022-09-25 DIAGNOSIS — M25522 Pain in left elbow: Secondary | ICD-10-CM | POA: Diagnosis not present

## 2022-09-25 DIAGNOSIS — G8929 Other chronic pain: Secondary | ICD-10-CM | POA: Diagnosis not present

## 2022-09-25 DIAGNOSIS — Z7689 Persons encountering health services in other specified circumstances: Secondary | ICD-10-CM | POA: Diagnosis not present

## 2022-09-25 NOTE — Therapy (Signed)
OUTPATIENT PHYSICAL THERAPY TREATMENT NOTE   Patient Name: Abigail Garcia MRN: [id number] DOB:[birth year], 52 y.o., female Today's Date: 09/25/2022  PCP: no PCP  REFERRING PROVIDER: Defoor, Dionne Ano, PA-C (Rheumatology)   END OF SESSION:   PT End of Session - 09/25/22 0953     Visit Number 4    Number of Visits 12    Date for PT Re-Evaluation 11/27/22    Authorization Type [place] MEDICA[employer] reporting period from 09/04/2022    Authorization Time Period [facility] auth#[id number] 4/15-7/30 for 12 PT visits    Authorization - Visit Number 4    Authorization - Number of Visits 12    Progress Note Due on Visit 10    PT Start Time 0935    PT Stop Time 1015    PT Time Calculation (min) 40 min    Activity Tolerance Patient tolerated treatment well    Behavior During Therapy [facility] for tasks assessed/performed               Past Medical History:  Diagnosis Date   Anemia    CAD (coronary artery disease)    a. 11/2012 NSTEMI/Cath/PCI: LM nl, LAD 80-90 thrombotic (tx with Heparin x 2 days then aspiration thrombectomy and PTCA), RI nl, LCX sm/nl, RCA nl, EF 55-65%.   Esophagitis    grade 1   Hx of echocardiogram    a. Echo (6/14) with EF 55-60%.    Menorrhagia    NSTEMI (non-ST elevated myocardial infarction)    11/2012    Obesity    RA (rheumatoid arthritis)    Past Surgical History:  Procedure Laterality Date   bilateral tubal     CARDIAC CATHETERIZATION  2014   CESAREAN SECTION     CORONARY ANGIOGRAM  11/19/2012   Procedure: CORONARY ANGIOGRAM;  Surgeon: Peter M Swaziland, MD;  Location: [facility] CATH LAB;  Service: Cardiovascular;;   DILATION AND CURETTAGE OF UTERUS     DILATION AND CURETTAGE OF UTERUS N/A 03/13/2019   Procedure: DILATATION AND CURETTAGE;  Surgeon: Allie Bossier, MD;  Location: [facility] OR;  Service: Gynecology;  Laterality: N/A;   INTRAVASCULAR ULTRASOUND  11/19/2012   Procedure: INTRAVASCULAR ULTRASOUND;  Surgeon: Peter M Swaziland, MD;  Location: [facility] CATH LAB;  Service:  Cardiovascular;;   LEFT HEART CATHETERIZATION WITH CORONARY ANGIOGRAM N/A 11/16/2012   Procedure: LEFT HEART CATHETERIZATION WITH CORONARY ANGIOGRAM;  Surgeon: Peter M Swaziland, MD;  Location[facility] CATH LAB;  Service: Cardiovascular;  Laterality: N/A;   PERCUTANEOUS CORONARY INTERVENTION-BALLOON ONLY  11/19/2012   Procedure: PERCUTANEOUS CORONARY INTERVENTION-BALLOON ONLY;  Surgeon: Peter M Swaziland, MD;  Location: [facility];  Service: Cardiovascular;;   Patient Active Problem List   Diagnosis Date Noted   Epidermal cyst 04/24/2021   Specimen satisfactory for evaluation but limited by absence of endocervical or transformation zone component from patient with cervix 09/30/2020   Symptomatic anemia 03/12/2019   Hirsutism 04/17/2017   Prediabetes 08/18/2015   Blurry vision, bilateral 08/18/2015   Abnormal uterine bleeding (AUB) 08/11/2015   Obesity 11/11/2013   Thrombocytosis 08/31/2013   Ganglion cyst of wrist 06/12/2013   Hyperlipidemia 12/03/2012   CAD S/P percutaneous coronary angioplasty 11/30/2012   Rheumatoid arthritis flare 11/14/2012   Anemia, iron deficiency 11/14/2012    REFERRING DIAG: rheumatoid arthritis involving multiple sites with positive rheumatoid factor   THERAPY DIAG:  Difficulty in walking, not elsewhere classified  Muscle weakness (generalized)  Cervicalgia  Pain in left elbow  Chronic pain of left knee  Pain in  right ankle and joints of right foot  Rationale for Evaluation and Treatment: Rehabilitation  PERTINENT HISTORY: Patient is a 52 y.o. female who presents to outpatient physical therapy with a referral for medical diagnosis rheumatoid arthritis involving multiple sites with positive rheumatoid factor. This patient's chief complaints consist of pain in her right ankle/foot, left knee, left elbow, left neck and jaw as well as generalized discomfort and difficulty with mobility and balance leading to the following functional deficits: difficulty moving  around, doing her daily tasks such as working, shopping, going up and down stairs, using her hands, dancing, spending time with grandchildren, getting up and down from the floor, keeping her balance. 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  Patient denies hx of cancer, stroke, seizures, lung problems, diabetes, unexplained weight loss, unexplained changes in bowel or bladder problems, osteoporosis, and spinal surgery   PRECAUTIONS:  active severe Stage 3 rheumatoid arthritis with positive rheumatoid factor, fall risk.   SUBJECTIVE:                                                                                                                                                                                      SUBJECTIVE STATEMENT:  Patient reports she did well after DN yesterday; can turn her head to Lt a little easier.    PAIN:  Are you in pain? Yes NPRS:  2/10 Location: Generalized    OBJECTIVE:    [facility] PT Assessment - 09/24/22 0001       Functional Gait  Assessment   Gait assessed  Yes    Gait Level Surface Walks 20 ft in less than 7 sec but greater than 5.5 sec, uses assistive device, slower speed, mild gait deviations, or deviates 6-10 in outside of the 12 in walkway width.   self selected pace: 6.88 seconds; fast pace: 5.88 seconds   Change in Gait Speed Able to smoothly change walking speed without loss of balance or gait deviation. Deviate no more than 6 in outside of the 12 in walkway width.    Gait with Horizontal Head Turns Performs head turns smoothly with no change in gait. Deviates no more than 6 in outside 12 in walkway width    Gait with Vertical Head Turns Performs head turns with no change in gait. Deviates no more than 6 in outside 12 in walkway width.    Gait and Pivot Turn Pivot turns safely within 3 sec and  stops quickly with no loss of balance.    Step Over Obstacle Is able to step over one  shoe box (4.5 in total height) but must slow down and adjust steps to clear box safely. May require verbal cueing.    Gait with Narrow Base of Support Ambulates less than 4 steps heel to toe or cannot perform without assistance.    Gait with Eyes Closed Walks 20 ft, slow speed, abnormal gait pattern, evidence for imbalance, deviates 10-15 in outside 12 in walkway width. Requires more than 9 sec to ambulate 20 ft.   11.81 seconds   Ambulating Backwards Walks 20 ft, slow speed, abnormal gait pattern, evidence for imbalance, deviates 10-15 in outside 12 in walkway width.   26 seconds, one stumble   Steps Two feet to a stair, must use rail.   last two steps down, used step to gait; otherwise was step over step   Total Score 18    FGA comment: < 19 = high risk fall              TODAY'S TREATMENT:    Pt seen for aquatic therapy today.  Treatment took place in water 3.5-4.75 ft in depth at the [facility] pool. Temp of water was 91.  Pt entered/exited the pool via stairs independently in step-to pattern with bilat rail. * holding barbell:  multiple laps walking forward / backward with cues for even step length, allowing Lt knee flexion in swing through, and avoiding circumduction of RLE * holding barbell:  side stepping L/R - 3 laps * standard stance tricep press down with rainbow hand floats x 10; one rep of shoulder addct with rainbow hand floats (not tolerated) *staggered stance with kick board row x 10 each LE forward * seated on bench in water:  cycling; long sitting hip abdct/ add; alternating LAQ with DF * straddling noodle in deeper water, UE on wall:  cycling and scissor legs   Pt requires the buoyancy and hydrostatic pressure of water for support, and to offload joints by unweighting joint load by at least 50 % in navel deep water and by at least 75-80% in chest to neck deep water.  Viscosity  of the water is needed for resistance of strengthening. Water current perturbations provides challenge to standing balance requiring increased core activation.      PATIENT EDUCATION:  Education details: Exercise purpose/form. Review of  HEP;  list of area pools Person educated: Patient Education method: Explanation / handout Education comprehension: verbalized understanding and needs further education   HOME EXERCISE PROGRAM: Access Code: [email or web address] URL: [email or web address] Date: 09/24/2022 Prepared by: Norton Blizzard  Exercises - Seated Upper Trapezius Stretch  - 2 x daily - 2-3 reps - 30 seconds hold - Seated Cervical Rotation AROM  - 2 x daily - 2 sets - 10 reps - Seated Scapular Retraction  - 1-2 x daily - 2 sets - 10 reps - 5 seconds hold - Prone Cervical Retraction  - 1 x daily - 3 sets - 10 reps - 1-5 seconds hold   ASSESSMENT:   CLINICAL IMPRESSION: Positive response to DN last session with reduction in pain and improved cervical ROM.  Pt is confident in aquatic environment and able to take direction from therapist on deck.  She felt more confident initially while holding barbell for UE support. Reviewed form for HEP, with demo provided.  Encouraged pt to check out pools in her area prior to next session.  Will begin to create aquatic HEP.  Patient tolerated today's session well and reported 0/10 at end of session. Patient would benefit from continued  management of limiting condition by skilled physical therapist to address remaining impairments and functional limitations to work towards stated goals and return to PLOF or maximal functional independence.   From initial PT evaluation from 09/04/2022:  Patient is a 52 y.o. female referred to outpatient physical therapy with a medical diagnosis of rheumatoid arthritis involving multiple sites with positive rheumatoid factor who presents with signs and symptoms consistent with multi-joint pain and mobility/balance deficits  related to rheumatoid arthritis. Patient would benefit from more specific examination of neck pain and balance at future sessions. Patient presents with significant pain, ROM, joint stiffness, posture, muscle performance (strength/power/endurance), gait, balance, muscle tension, and activity tolerance impairments that are limiting ability to complete activities such as moving around, doing her daily tasks such as working, shopping, going up and down stairs, using her hands, dancing, spending time with grandchildren, getting up and down from the floor, keeping her balance without difficulty. She would benefit from the buoyancy and viscosity of water and is a good candidate for aquatic therapy. Patient will benefit from skilled physical therapy intervention to address current body structure impairments and activity limitations to improve function and work towards goals set in current POC in order to return to prior level of function or maximal functional improvement.      OBJECTIVE IMPAIRMENTS: Abnormal gait, decreased activity tolerance, decreased balance, decreased endurance, decreased knowledge of condition, decreased knowledge of use of DME, decreased mobility, difficulty walking, decreased ROM, decreased strength, hypomobility, increased edema, impaired perceived functional ability, increased muscle spasms, impaired flexibility, impaired UE functional use, improper body mechanics, postural dysfunction, obesity, and pain.    ACTIVITY LIMITATIONS: carrying, lifting, bending, standing, squatting, stairs, transfers, bed mobility, dressing, reach over head, hygiene/grooming, and locomotion level   PARTICIPATION LIMITATIONS: interpersonal relationship, community activity, occupation, and   difficulty moving around, doing her daily tasks such as working, shopping, going up and down stairs, using her hands, dancing, spending time with grandchildren, getting up and down from the floor, keeping her balance    PERSONAL FACTORS: Fitness, Past/current experiences, Profession, Time since onset of injury/illness/exacerbation, Transportation, and 3+ comorbidities:   anemia, CAD, esophagitis, menorrhagia, NSTEMI (and cardiac cath 2014), active severe Stage 3 rheumatoid arthritis with positive rheumatoid factor are also affecting patient's functional outcome.    REHAB POTENTIAL: Good   CLINICAL DECISION MAKING: Evolving/moderate complexity   EVALUATION COMPLEXITY: Moderate     GOALS: Goals reviewed with patient? No   SHORT TERM GOALS: Target date: 09/18/2022   Patient will be independent with initial home exercise program for self-management of symptoms. Baseline: Initial HEP to be provided at visit 2 as appropriate (09/04/22); Goal status: In-progress     LONG TERM GOALS: Target date: 11/27/2022   Patient will be independent with a long-term home exercise program for self-management of symptoms.  Baseline: Initial HEP to be provided at visit 2 as appropriate (09/04/22); Goal status: In-progress   2.  Patient will demonstrate improved FOTO to equal or greater than 40 by visit #12 to demonstrate improvement in overall condition and self-reported functional ability.  Baseline: 50 (09/04/22); Goal status: In-progress   3.  Patient will complete floor transfer test in equal or less than 8.8 seconds to demonstrate improved ability to get up and down from the floor so she can play with her grandchildren.  Baseline: 24 seconds (09/04/22); Goal status: In-progress   4.  Patient will ambulate equal or greater than 1.2 m/s in the 10 Meter Walking Trial to improve her safety when  walking across an intersection. Baseline: 0.99 m/s (09/04/22); Goal status: In-progress   5.  Patient will report decreased pain to equal or less than 3/10 during functional activities to improve her ability to work, and do leisure activities.  Baseline: up to 8/10 (09/04/22); Goal status: In-progress   6.  Patient will  ascend and descend four 6 inch steps using step over step technique with no evidence of lack of control and no use of B UE to improve her ability to use stairs at her home while carrying items and with decreased fall risk.  Baseline: needs B UE support and lacks eccentric control (09/04/2022);  Goal status: In-progress     PLAN:   PT FREQUENCY: 1-2x/week   PT DURATION: 12 weeks   PLANNED INTERVENTIONS: Therapeutic exercises, Therapeutic activity, Neuromuscular re-education, Balance training, Gait training, Patient/Family education, Self Care, Joint mobilization, Stair training, DME instructions, Aquatic Therapy, Dry Needling, Electrical stimulation, Spinal mobilization, Cryotherapy, Moist heat, Manual therapy, and Re-evaluation.   PLAN FOR NEXT SESSION: aquatic therapy, update HEP as appropriate, progressive posture/LE/UE/core/functional strength and ROM exercises and balance exercises as appropriate. Education.    Mayer Camel, PTA 09/25/22 10:24 AM [facility] [address] [place], [place], [address] Phone: [phone]   Fax:  [phone]

## 2022-09-26 ENCOUNTER — Encounter: Payer: Medicaid Other | Admitting: Physical Therapy

## 2022-09-30 ENCOUNTER — Encounter: Payer: Self-pay | Admitting: Physical Therapy

## 2022-09-30 ENCOUNTER — Ambulatory Visit: Payer: Medicaid Other | Admitting: Physical Therapy

## 2022-09-30 DIAGNOSIS — M25522 Pain in left elbow: Secondary | ICD-10-CM

## 2022-09-30 DIAGNOSIS — M25562 Pain in left knee: Secondary | ICD-10-CM | POA: Diagnosis not present

## 2022-09-30 DIAGNOSIS — M6281 Muscle weakness (generalized): Secondary | ICD-10-CM | POA: Diagnosis not present

## 2022-09-30 DIAGNOSIS — G8929 Other chronic pain: Secondary | ICD-10-CM | POA: Diagnosis not present

## 2022-09-30 DIAGNOSIS — R262 Difficulty in walking, not elsewhere classified: Secondary | ICD-10-CM

## 2022-09-30 DIAGNOSIS — M542 Cervicalgia: Secondary | ICD-10-CM | POA: Diagnosis not present

## 2022-09-30 DIAGNOSIS — Z7689 Persons encountering health services in other specified circumstances: Secondary | ICD-10-CM | POA: Diagnosis not present

## 2022-09-30 DIAGNOSIS — M25571 Pain in right ankle and joints of right foot: Secondary | ICD-10-CM

## 2022-09-30 NOTE — Therapy (Signed)
OUTPATIENT PHYSICAL THERAPY TREATMENT NOTE   Patient Name: Abigail Garcia MRN: [id number] DOB:[birth year], 52 y.o., female Today's Date: 09/30/2022  PCP: no PCP  REFERRING PROVIDER: Defoor, Dionne Ano, PA-C (Rheumatology)   END OF SESSION:   PT End of Session - 09/30/22 0951     Visit Number 5    Number of Visits 12    Date for PT Re-Evaluation 11/27/22    Authorization Type [place] MEDICA[employer] reporting period from 09/04/2022    Authorization Time Period [facility] auth#[id number] 4/15-7/30 for 12 PT visits    Authorization - Visit Number 4    Authorization - Number of Visits 12    Progress Note Due on Visit 10    PT Start Time 0945    PT Stop Time 1025    PT Time Calculation (min) 40 min    Activity Tolerance Patient tolerated treatment well    Behavior During Therapy [facility] for tasks assessed/performed               Past Medical History:  Diagnosis Date   Anemia    CAD (coronary artery disease)    a. 11/2012 NSTEMI/Cath/PCI: LM nl, LAD 80-90 thrombotic (tx with Heparin x 2 days then aspiration thrombectomy and PTCA), RI nl, LCX sm/nl, RCA nl, EF 55-65%.   Esophagitis    grade 1   Hx of echocardiogram    a. Echo (6/14) with EF 55-60%.    Menorrhagia    NSTEMI (non-ST elevated myocardial infarction) (HCC)    11/2012    Obesity    RA (rheumatoid arthritis) (HCC)    Past Surgical History:  Procedure Laterality Date   bilateral tubal     CARDIAC CATHETERIZATION  2014   CESAREAN SECTION     CORONARY ANGIOGRAM  11/19/2012   Procedure: CORONARY ANGIOGRAM;  Surgeon: Peter M Swaziland, MD;  Location: [facility] CATH LAB;  Service: Cardiovascular;;   DILATION AND CURETTAGE OF UTERUS     DILATION AND CURETTAGE OF UTERUS N/A 03/13/2019   Procedure: DILATATION AND CURETTAGE;  Surgeon: Allie Bossier, MD;  Location: [facility] OR;  Service: Gynecology;  Laterality: N/A;   INTRAVASCULAR ULTRASOUND  11/19/2012   Procedure: INTRAVASCULAR ULTRASOUND;  Surgeon: Peter M Swaziland, MD;  Location: [facility] CATH LAB;   Service: Cardiovascular;;   LEFT HEART CATHETERIZATION WITH CORONARY ANGIOGRAM N/A 11/16/2012   Procedure: LEFT HEART CATHETERIZATION WITH CORONARY ANGIOGRAM;  Surgeon: Peter M Swaziland, MD;  Location: [facility] CATH LAB;  Service: Cardiovascular;  Laterality: N/A;   PERCUTANEOUS CORONARY INTERVENTION-BALLOON ONLY  11/19/2012   Procedure: PERCUTANEOUS CORONARY INTERVENTION-BALLOON ONLY;  Surgeon: Peter M Swaziland, MD;  Location: [facility] CATH LAB;  Service: Cardiovascular;;   Patient Active Problem List   Diagnosis Date Noted   Epidermal cyst 04/24/2021   Specimen satisfactory for evaluation but limited by absence of endocervical or transformation zone component from patient with cervix 09/30/2020   Symptomatic anemia 03/12/2019   Hirsutism 04/17/2017   Prediabetes 08/18/2015   Blurry vision, bilateral 08/18/2015   Abnormal uterine bleeding (AUB) 08/11/2015   Obesity 11/11/2013   Thrombocytosis 08/31/2013   Ganglion cyst of wrist 06/12/2013   Hyperlipidemia 12/03/2012   CAD S/P percutaneous coronary angioplasty 11/30/2012   Rheumatoid arthritis flare (HCC) 11/14/2012   Anemia, iron deficiency 11/14/2012    REFERRING DIAG: rheumatoid arthritis involving multiple sites with positive rheumatoid factor   THERAPY DIAG:  Difficulty in walking, not elsewhere classified  Muscle weakness (generalized)  Cervicalgia  Pain in left elbow  Chronic pain of left knee  Pain in right ankle and joints of right foot  Rationale for Evaluation and Treatment: Rehabilitation  PERTINENT HISTORY: Patient is a 52 y.o. female who presents to outpatient physical therapy with a referral for medical diagnosis rheumatoid arthritis involving multiple sites with positive rheumatoid factor. This patient's chief complaints consist of pain in her right ankle/foot, left knee, left elbow, left neck and jaw as well as generalized discomfort and difficulty with mobility and balance leading to the following functional deficits:  difficulty moving around, doing her daily tasks such as working, shopping, going up and down stairs, using her hands, dancing, spending time with grandchildren, getting up and down from the floor, keeping her balance. 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  Patient denies hx of cancer, stroke, seizures, lung problems, diabetes, unexplained weight loss, unexplained changes in bowel or bladder problems, osteoporosis, and spinal surgery   PRECAUTIONS:  active severe Stage 3 rheumatoid arthritis with positive rheumatoid factor, fall risk.   SUBJECTIVE:                                                                                                                                                                                      SUBJECTIVE STATEMENT:  Patient reports everything is a little tight. She states she was sore the day after the pool, but she was expecting this after being educated by PTA that it was going to happen.    PAIN:  Are you in pain? Yes NPRS:  1.5/10 Location: Generalized.     OBJECTIVE:   TODAY'S TREATMENT:    Therapeutic exercise: to centralize symptoms and improve ROM, strength, muscular endurance, and activity tolerance required for successful completion of functional activities.  - NuStep level 1 using bilateral upper and lower extremities. Seat/handle setting 9/9. For improved extremity mobility, muscular endurance, and activity tolerance; and to induce the analgesic effect of aerobic exercise, stimulate improved joint nutrition, and prepare body structures and systems for following interventions. x 6 minutes. Average SPM = 53. - prone cervical retraction, 3x10 with 1-5 second holds - prone scapular retraction with hands on table, 1x10 with 5 second holds.  - Education on HEP including handout    Neuromuscular Re-education: to improve, balance, postural strength, muscle activation patterns, and stabilization strength required for functional activities: - standing alternating foot taps on 8 inch step, UE support available as needed, attempting to keep eyes up and tap lightly, 1x20, 2x15 each side with seated rest between sets. (Discomfort in R ankle).  - tandem walking with U UE support available as needed, 1x10  for 5 foot stretches along TM bar.  - backwards walking, 1x5 along 5 feet no UE support (support available).  - Education on HEP including handout     PATIENT EDUCATION:  Education details: Exercise purpose/form. Self management techniques. HEP Person educated: Patient Education method: Explanation / handout Education comprehension: verbalized understanding and needs further education   HOME EXERCISE PROGRAM: Access Code: [email or web address] URL: [email or web address] Date: 09/30/2022 Prepared by: Norton Blizzard  Exercises - Seated Upper Trapezius Stretch  - 2 x daily - 2-3 reps - 30 seconds hold - Seated Cervical Rotation AROM  - 2 x daily - 2 sets - 10 reps - Seated Scapular Retraction  - 1-2 x daily - 2 sets - 10 reps - 5 seconds hold - Prone Cervical Retraction  - 1 x daily - 3 sets - 10 reps - 1-5 seconds hold - Standing Toe Taps  - 3-5 x weekly - 3 sets - 15 reps - Walking Tandem Stance  - 3-5 x weekly - 1-2 sets - 10 reps   ASSESSMENT:   CLINICAL IMPRESSION: Patient arrives with good tolerance to aquatic therapy session and last land PT session. Today's session focused on updating HEP to include well tolerated balance exercise and progressions in postural strengthening. Patient reported headache after prone exercises at end of session. She thought this might be related to not eating before session. She was educated to stop prone exercises if it continued to start right after prone exercises. Patient would benefit from continued management of limiting condition  by skilled physical therapist to address remaining impairments and functional limitations to work towards stated goals and return to PLOF or maximal functional independence.   From initial PT evaluation from 09/04/2022:  Patient is a 52 y.o. female referred to outpatient physical therapy with a medical diagnosis of rheumatoid arthritis involving multiple sites with positive rheumatoid factor who presents with signs and symptoms consistent with multi-joint pain and mobility/balance deficits related to rheumatoid arthritis. Patient would benefit from more specific examination of neck pain and balance at future sessions. Patient presents with significant pain, ROM, joint stiffness, posture, muscle performance (strength/power/endurance), gait, balance, muscle tension, and activity tolerance impairments that are limiting ability to complete activities such as moving around, doing her daily tasks such as working, shopping, going up and down stairs, using her hands, dancing, spending time with grandchildren, getting up and down from the floor, keeping her balance without difficulty. She would benefit from the buoyancy and viscosity of water and is a good candidate for aquatic therapy. Patient will benefit from skilled physical therapy intervention to address current body structure impairments and activity limitations to improve function and work towards goals set in current POC in order to return to prior level of function or maximal functional improvement.      OBJECTIVE IMPAIRMENTS: Abnormal gait, decreased activity tolerance, decreased balance, decreased endurance, decreased knowledge of condition, decreased knowledge of use of DME, decreased mobility, difficulty walking, decreased ROM, decreased strength, hypomobility, increased edema, impaired perceived functional ability, increased muscle spasms, impaired flexibility, impaired UE functional use, improper body mechanics, postural dysfunction, obesity, and pain.     ACTIVITY LIMITATIONS: carrying, lifting, bending, standing, squatting, stairs, transfers, bed mobility, dressing, reach over head, hygiene/grooming, and locomotion level   PARTICIPATION LIMITATIONS: interpersonal relationship, community activity, occupation, and   difficulty moving around, doing her daily tasks such as working, shopping, going up and down stairs, using her hands, dancing, spending time with grandchildren, getting up and down from the floor, keeping  her balance   PERSONAL FACTORS: Fitness, Past/current experiences, Profession, Time since onset of injury/illness/exacerbation, Transportation, and 3+ comorbidities:   anemia, CAD, esophagitis, menorrhagia, NSTEMI (and cardiac cath 2014), active severe Stage 3 rheumatoid arthritis with positive rheumatoid factor are also affecting patient's functional outcome.    REHAB POTENTIAL: Good   CLINICAL DECISION MAKING: Evolving/moderate complexity   EVALUATION COMPLEXITY: Moderate     GOALS: Goals reviewed with patient? No   SHORT TERM GOALS: Target date: 09/18/2022   Patient will be independent with initial home exercise program for self-management of symptoms. Baseline: Initial HEP to be provided at visit 2 as appropriate (09/04/22); Goal status: In-progress     LONG TERM GOALS: Target date: 11/27/2022   Patient will be independent with a long-term home exercise program for self-management of symptoms.  Baseline: Initial HEP to be provided at visit 2 as appropriate (09/04/22); Goal status: In-progress   2.  Patient will demonstrate improved FOTO to equal or greater than 40 by visit #12 to demonstrate improvement in overall condition and self-reported functional ability.  Baseline: 50 (09/04/22); Goal status: In-progress   3.  Patient will complete floor transfer test in equal or less than 8.8 seconds to demonstrate improved ability to get up and down from the floor so she can play with her grandchildren.  Baseline: 24 seconds  (09/04/22); Goal status: In-progress   4.  Patient will ambulate equal or greater than 1.2 m/s in the 10 Meter Walking Trial to improve her safety when walking across an intersection. Baseline: 0.99 m/s (09/04/22); Goal status: In-progress   5.  Patient will report decreased pain to equal or less than 3/10 during functional activities to improve her ability to work, and do leisure activities.  Baseline: up to 8/10 (09/04/22); Goal status: In-progress   6.  Patient will ascend and descend four 6 inch steps using step over step technique with no evidence of lack of control and no use of B UE to improve her ability to use stairs at her home while carrying items and with decreased fall risk.  Baseline: needs B UE support and lacks eccentric control (09/04/2022);  Goal status: In-progress     PLAN:   PT FREQUENCY: 1-2x/week   PT DURATION: 12 weeks   PLANNED INTERVENTIONS: Therapeutic exercises, Therapeutic activity, Neuromuscular re-education, Balance training, Gait training, Patient/Family education, Self Care, Joint mobilization, Stair training, DME instructions, Aquatic Therapy, Dry Needling, Electrical stimulation, Spinal mobilization, Cryotherapy, Moist heat, Manual therapy, and Re-evaluation.   PLAN FOR NEXT SESSION: aquatic therapy, update HEP as appropriate, progressive posture/LE/UE/core/functional strength and ROM exercises and balance exercises as appropriate. Education.    Mayer Camel, PTA 09/30/22 12:28 PM [facility] [address] [place], [place], [address] Phone: [phone]   Fax:  [phone]

## 2022-10-01 NOTE — Therapy (Signed)
OUTPATIENT PHYSICAL THERAPY TREATMENT NOTE   Patient Name: Abigail Garcia MRN: [id number] DOB:[birth year], 52 y.o., female Today's Date: 10/02/2022  PCP: no PCP  REFERRING PROVIDER: Defoor, Dionne Ano, PA-C (Rheumatology)   END OF SESSION:   PT End of Session - 10/02/22 1637     Visit Number 6    Number of Visits 12    Date for PT Re-Evaluation 11/27/22    Authorization Type [place] [employer] reporting period from 09/04/2022    Authorization Time Period [employer] auth#[id number] 4/15-7/30 for 12 PT visits    Authorization - Number of Visits 12    Progress Note Due on Visit 10    PT Start Time 1547    PT Stop Time 1630    PT Time Calculation (min) 43 min    Activity Tolerance Patient tolerated treatment well    Behavior During Therapy [facility] for tasks assessed/performed                Past Medical History:  Diagnosis Date   Anemia    CAD (coronary artery disease)    a. 11/2012 NSTEMI/Cath/PCI: LM nl, LAD 80-90 thrombotic (tx with Heparin x 2 days then aspiration thrombectomy and PTCA), RI nl, LCX sm/nl, RCA nl, EF 55-65%.   Esophagitis    grade 1   Hx of echocardiogram    a. Echo (6/14) with EF 55-60%.    Menorrhagia    NSTEMI (non-ST elevated myocardial infarction) (HCC)    11/2012    Obesity    RA (rheumatoid arthritis) (HCC)    Past Surgical History:  Procedure Laterality Date   bilateral tubal     CARDIAC CATHETERIZATION  2014   CESAREAN SECTION     CORONARY ANGIOGRAM  11/19/2012   Procedure: CORONARY ANGIOGRAM;  Surgeon: Peter M Swaziland, MD;  Location: [facility] CATH LAB;  Service: Cardiovascular;;   DILATION AND CURETTAGE OF UTERUS     DILATION AND CURETTAGE OF UTERUS N/A 03/13/2019   Procedure: DILATATION AND CURETTAGE;  Surgeon: Allie Bossier, MD;  Location: [facility] OR;  Service: Gynecology;  Laterality: N/A;   INTRAVASCULAR ULTRASOUND  11/19/2012   Procedure: INTRAVASCULAR ULTRASOUND;  Surgeon: Peter M Swaziland, MD;  Location: [facility] CATH LAB;  Service: Cardiovascular;;   LEFT  HEART CATHETERIZATION WITH CORONARY ANGIOGRAM N/A 11/16/2012   Procedure: LEFT HEART CATHETERIZATION WITH CORONARY ANGIOGRAM;  Surgeon: Peter M Swaziland, MD;  Location: [facility] CATH LAB;  Service: Cardiovascular;  Laterality: N/A;   PERCUTANEOUS CORONARY INTERVENTION-BALLOON ONLY  11/19/2012   Procedure: PERCUTANEOUS CORONARY INTERVENTION-BALLOON ONLY;  Surgeon: Peter M Swaziland, MD;  Location: [facility] CATH LAB;  Service: Cardiovascular;;   Patient Active Problem List   Diagnosis Date Noted   Epidermal cyst 04/24/2021   Specimen satisfactory for evaluation but limited by absence of endocervical or transformation zone component from patient with cervix 09/30/2020   Symptomatic anemia 03/12/2019   Hirsutism 04/17/2017   Prediabetes 08/18/2015   Blurry vision, bilateral 08/18/2015   Abnormal uterine bleeding (AUB) 08/11/2015   Obesity 11/11/2013   Thrombocytosis 08/31/2013   Ganglion cyst of wrist 06/12/2013   Hyperlipidemia 12/03/2012   CAD S/P percutaneous coronary angioplasty 11/30/2012   Rheumatoid arthritis flare (HCC) 11/14/2012   Anemia, iron deficiency 11/14/2012    REFERRING DIAG: rheumatoid arthritis involving multiple sites with positive rheumatoid factor   THERAPY DIAG:  Difficulty in walking, not elsewhere classified  Muscle weakness (generalized)  Pain in left elbow  Chronic pain of left knee  Pain in right ankle and joints of right  foot  Rationale for Evaluation and Treatment: Rehabilitation  PERTINENT HISTORY: Patient is a 52 y.o. female who presents to outpatient physical therapy with a referral for medical diagnosis rheumatoid arthritis involving multiple sites with positive rheumatoid factor. This patient's chief complaints consist of pain in her right ankle/foot, left knee, left elbow, left neck and jaw as well as generalized discomfort and difficulty with mobility and balance leading to the following functional deficits: difficulty moving around, doing her daily tasks such as  working, shopping, going up and down stairs, using her hands, dancing, spending time with grandchildren, getting up and down from the floor, keeping her balance. 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  Patient denies hx of cancer, stroke, seizures, lung problems, diabetes, unexplained weight loss, unexplained changes in bowel or bladder problems, osteoporosis, and spinal surgery   PRECAUTIONS:  active severe Stage 3 rheumatoid arthritis with positive rheumatoid factor, fall risk.   SUBJECTIVE:                                                                                                                                                                                      SUBJECTIVE STATEMENT:  Patient reports being a liitle sore all over after last pool session which lasted a short few hours.   PAIN:  Are you in pain? Yes NPRS:  1.5/10 Location: Generalized.     OBJECTIVE:   TODAY'S TREATMENT:    Pt seen for aquatic therapy today.  Treatment took place in water 3.5-4.75 ft in depth at the [facility] pool. Temp of water was 91.  Pt entered/exited the pool via stairs independently in step-to pattern with bilat rail. * holding barbell:  multiple laps walking forward / backward with cues for even step length,  * holding barbell:  side stepping L/R - 3 laps *3 way tap ue support on rainbow HB; Cues for abdominal bracing for core control and improved balance *toe taping 6 in step x10 ue support rainbow hand buoys.  *seated on lift chair *UE support on wall 4 ft: df; pf; hip add/abd; high knee marching; hip extension * standard stance tricep press down (not tolerated today) *walking forward and back with arm swing between exercises for recovery   Pt requires the buoyancy and hydrostatic pressure of water for support, and  to offload joints by unweighting joint load by at least 50 % in navel deep water and by at least 75-80% in chest to neck deep water.  Viscosity of the water is needed for resistance of strengthening. Water current perturbations provides  challenge to standing balance requiring increased core activation.    Therapeutic exercise: to centralize symptoms and improve ROM, strength, muscular endurance, and activity tolerance required for successful completion of functional activities.  - NuStep level 1 using bilateral upper and lower extremities. Seat/handle setting 9/9. For improved extremity mobility, muscular endurance, and activity tolerance; and to induce the analgesic effect of aerobic exercise, stimulate improved joint nutrition, and prepare body structures and systems for following interventions. x 6 minutes. Average SPM = 53. - prone cervical retraction, 3x10 with 1-5 second holds - prone scapular retraction with hands on table, 1x10 with 5 second holds.  - Education on HEP including handout   Neuromuscular Re-education: to improve, balance, postural strength, muscle activation patterns, and stabilization strength required for functional activities: - standing alternating foot taps on 8 inch step, UE support available as needed, attempting to keep eyes up and tap lightly, 1x20, 2x15 each side with seated rest between sets. (Discomfort in R ankle).  - tandem walking with U UE support available as needed, 1x10 for 5 foot stretches along TM bar.  - backwards walking, 1x5 along 5 feet no UE support (support available).  - Education on HEP including handout     PATIENT EDUCATION:  Education details: Exercise purpose/form. Self management techniques. HEP Person educated: Patient Education method: Explanation / handout Education comprehension: verbalized understanding and needs further education   HOME EXERCISE PROGRAM: Access Code: [email or web address] URL: [email or web address] Date:  09/30/2022 Prepared by: Norton Blizzard  Exercises - Seated Upper Trapezius Stretch  - 2 x daily - 2-3 reps - 30 seconds hold - Seated Cervical Rotation AROM  - 2 x daily - 2 sets - 10 reps - Seated Scapular Retraction  - 1-2 x daily - 2 sets - 10 reps - 5 seconds hold - Prone Cervical Retraction  - 1 x daily - 3 sets - 10 reps - 1-5 seconds hold - Standing Toe Taps  - 3-5 x weekly - 3 sets - 15 reps - Walking Tandem Stance  - 3-5 x weekly - 1-2 sets - 10 reps   ASSESSMENT:   CLINICAL IMPRESSION: Pt requires extra time with all activities as she is apprehensive/guarded with anticipation of onset of pain. She does have some discomfort in ue with holding to wall with execution of standing exercises, switching to support on barbell decreases discomfort and/but adds to balance challenge. Her guarding waxes and wains throughout session. Overall she tolerates very well without pain upon completion. She demonstrates improved postural control and balance with walking submerged completing without UE support which decreased ue discomfort. Goals ongoing    From initial PT evaluation from 09/04/2022:  Patient is a 52 y.o. female referred to outpatient physical therapy with a medical diagnosis of rheumatoid arthritis involving multiple sites with positive rheumatoid factor who presents with signs and symptoms consistent with multi-joint pain and mobility/balance deficits related to rheumatoid arthritis. Patient would benefit from more specific examination of neck pain and balance at future sessions. Patient presents with significant pain, ROM, joint stiffness, posture, muscle performance (strength/power/endurance), gait, balance, muscle tension, and activity tolerance impairments that are limiting ability to complete activities such as moving around, doing her daily tasks such as working, shopping, going up and down stairs, using her hands, dancing, spending time with grandchildren, getting up and down from the  floor, keeping her balance without difficulty. She would benefit from the buoyancy and viscosity of water and is a good candidate for aquatic therapy. Patient will benefit from skilled  physical therapy intervention to address current body structure impairments and activity limitations to improve function and work towards goals set in current POC in order to return to prior level of function or maximal functional improvement.      OBJECTIVE IMPAIRMENTS: Abnormal gait, decreased activity tolerance, decreased balance, decreased endurance, decreased knowledge of condition, decreased knowledge of use of DME, decreased mobility, difficulty walking, decreased ROM, decreased strength, hypomobility, increased edema, impaired perceived functional ability, increased muscle spasms, impaired flexibility, impaired UE functional use, improper body mechanics, postural dysfunction, obesity, and pain.    ACTIVITY LIMITATIONS: carrying, lifting, bending, standing, squatting, stairs, transfers, bed mobility, dressing, reach over head, hygiene/grooming, and locomotion level   PARTICIPATION LIMITATIONS: interpersonal relationship, community activity, occupation, and   difficulty moving around, doing her daily tasks such as working, shopping, going up and down stairs, using her hands, dancing, spending time with grandchildren, getting up and down from the floor, keeping her balance   PERSONAL FACTORS: Fitness, Past/current experiences, Profession, Time since onset of injury/illness/exacerbation, Transportation, and 3+ comorbidities:   anemia, CAD, esophagitis, menorrhagia, NSTEMI (and cardiac cath 2014), active severe Stage 3 rheumatoid arthritis with positive rheumatoid factor are also affecting patient's functional outcome.    REHAB POTENTIAL: Good   CLINICAL DECISION MAKING: Evolving/moderate complexity   EVALUATION COMPLEXITY: Moderate     GOALS: Goals reviewed with patient? No   SHORT TERM GOALS: Target date:  09/18/2022   Patient will be independent with initial home exercise program for self-management of symptoms. Baseline: Initial HEP to be provided at visit 2 as appropriate (09/04/22); Goal status: In-progress     LONG TERM GOALS: Target date: 11/27/2022   Patient will be independent with a long-term home exercise program for self-management of symptoms.  Baseline: Initial HEP to be provided at visit 2 as appropriate (09/04/22); Goal status: In-progress   2.  Patient will demonstrate improved FOTO to equal or greater than 40 by visit #12 to demonstrate improvement in overall condition and self-reported functional ability.  Baseline: 50 (09/04/22); Goal status: In-progress   3.  Patient will complete floor transfer test in equal or less than 8.8 seconds to demonstrate improved ability to get up and down from the floor so she can play with her grandchildren.  Baseline: 24 seconds (09/04/22); Goal status: In-progress   4.  Patient will ambulate equal or greater than 1.2 m/s in the 10 Meter Walking Trial to improve her safety when walking across an intersection. Baseline: 0.99 m/s (09/04/22); Goal status: In-progress   5.  Patient will report decreased pain to equal or less than 3/10 during functional activities to improve her ability to work, and do leisure activities.  Baseline: up to 8/10 (09/04/22); Goal status: In-progress   6.  Patient will ascend and descend four 6 inch steps using step over step technique with no evidence of lack of control and no use of B UE to improve her ability to use stairs at her home while carrying items and with decreased fall risk.  Baseline: needs B UE support and lacks eccentric control (09/04/2022);  Goal status: In-progress     PLAN:   PT FREQUENCY: 1-2x/week   PT DURATION: 12 weeks   PLANNED INTERVENTIONS: Therapeutic exercises, Therapeutic activity, Neuromuscular re-education, Balance training, Gait training, Patient/Family education, Self Care,  Joint mobilization, Stair training, DME instructions, Aquatic Therapy, Dry Needling, Electrical stimulation, Spinal mobilization, Cryotherapy, Moist heat, Manual therapy, and Re-evaluation.   PLAN FOR NEXT SESSION: aquatic therapy, update HEP as appropriate, progressive posture/LE/UE/core/functional  strength and ROM exercises and balance exercises as appropriate. Education.   Corrie Dandy [place]) Kendi Defalco MPT 10/02/22 4:38 PM [facility] [address] [place], [place], [address] Phone: [phone]   Fax:  [phone]

## 2022-10-02 ENCOUNTER — Ambulatory Visit (HOSPITAL_BASED_OUTPATIENT_CLINIC_OR_DEPARTMENT_OTHER): Payer: Medicaid Other | Attending: Rheumatology | Admitting: Physical Therapy

## 2022-10-02 ENCOUNTER — Encounter (HOSPITAL_BASED_OUTPATIENT_CLINIC_OR_DEPARTMENT_OTHER): Payer: Self-pay | Admitting: Physical Therapy

## 2022-10-02 DIAGNOSIS — Z7689 Persons encountering health services in other specified circumstances: Secondary | ICD-10-CM | POA: Diagnosis not present

## 2022-10-02 DIAGNOSIS — M6281 Muscle weakness (generalized): Secondary | ICD-10-CM | POA: Diagnosis not present

## 2022-10-02 DIAGNOSIS — M25522 Pain in left elbow: Secondary | ICD-10-CM | POA: Diagnosis not present

## 2022-10-02 DIAGNOSIS — R262 Difficulty in walking, not elsewhere classified: Secondary | ICD-10-CM | POA: Diagnosis not present

## 2022-10-02 DIAGNOSIS — M25571 Pain in right ankle and joints of right foot: Secondary | ICD-10-CM

## 2022-10-02 DIAGNOSIS — G8929 Other chronic pain: Secondary | ICD-10-CM | POA: Insufficient documentation

## 2022-10-02 DIAGNOSIS — M25562 Pain in left knee: Secondary | ICD-10-CM | POA: Diagnosis not present

## 2022-10-02 DIAGNOSIS — Z419 Encounter for procedure for purposes other than remedying health state, unspecified: Secondary | ICD-10-CM | POA: Diagnosis not present

## 2022-10-06 ENCOUNTER — Other Ambulatory Visit: Payer: Self-pay | Admitting: Obstetrics & Gynecology

## 2022-10-06 DIAGNOSIS — N939 Abnormal uterine and vaginal bleeding, unspecified: Secondary | ICD-10-CM

## 2022-10-08 ENCOUNTER — Encounter: Payer: Medicaid Other | Admitting: Physical Therapy

## 2022-10-08 ENCOUNTER — Ambulatory Visit (HOSPITAL_BASED_OUTPATIENT_CLINIC_OR_DEPARTMENT_OTHER): Payer: Medicaid Other | Admitting: Physical Therapy

## 2022-10-08 DIAGNOSIS — M25562 Pain in left knee: Secondary | ICD-10-CM | POA: Diagnosis not present

## 2022-10-08 DIAGNOSIS — M6281 Muscle weakness (generalized): Secondary | ICD-10-CM | POA: Diagnosis not present

## 2022-10-08 DIAGNOSIS — M25522 Pain in left elbow: Secondary | ICD-10-CM

## 2022-10-08 DIAGNOSIS — G8929 Other chronic pain: Secondary | ICD-10-CM | POA: Diagnosis not present

## 2022-10-08 DIAGNOSIS — Z7689 Persons encountering health services in other specified circumstances: Secondary | ICD-10-CM | POA: Diagnosis not present

## 2022-10-08 DIAGNOSIS — R262 Difficulty in walking, not elsewhere classified: Secondary | ICD-10-CM | POA: Diagnosis not present

## 2022-10-08 DIAGNOSIS — M25571 Pain in right ankle and joints of right foot: Secondary | ICD-10-CM | POA: Diagnosis not present

## 2022-10-08 NOTE — Therapy (Signed)
OUTPATIENT PHYSICAL THERAPY TREATMENT NOTE   Patient Name: Abigail Garcia MRN: [id number] DOB:[birth year], 52 y.o., female Today's Date: 10/08/2022  PCP: no PCP  REFERRING PROVIDER: Defoor, Dionne Ano, PA-C (Rheumatology)   END OF SESSION:   PT End of Session - 10/08/22 1707     Visit Number 7    Number of Visits 12    Date for PT Re-Evaluation 11/27/22    Authorization Type [place] MEDICAID [employer] reporting period from 09/04/2022    Authorization Time Period [employer] auth#[id number] 4/15-7/30 for 12 PT visits    Authorization - Number of Visits 12    Progress Note Due on Visit 10    PT Start Time 1704    PT Stop Time 1743    PT Time Calculation (min) 39 min    Behavior During Therapy [facility] for tasks assessed/performed                Past Medical History:  Diagnosis Date   Anemia    CAD (coronary artery disease)    a. 11/2012 NSTEMI/Cath/PCI: LM nl, LAD 80-90 thrombotic (tx with Heparin x 2 days then aspiration thrombectomy and PTCA), RI nl, LCX sm/nl, RCA nl, EF 55-65%.   Esophagitis    grade 1   Hx of echocardiogram    a. Echo (6/14) with EF 55-60%.    Menorrhagia    NSTEMI (non-ST elevated myocardial infarction) (HCC)    11/2012    Obesity    RA (rheumatoid arthritis) (HCC)    Past Surgical History:  Procedure Laterality Date   bilateral tubal     CARDIAC CATHETERIZATION  2014   CESAREAN SECTION     CORONARY ANGIOGRAM  11/19/2012   Procedure: CORONARY ANGIOGRAM;  Surgeon: Peter M Swaziland, MD;  Location: [facility] CATH LAB;  Service: Cardiovascular;;   DILATION AND CURETTAGE OF UTERUS     DILATION AND CURETTAGE OF UTERUS N/A 03/13/2019   Procedure: DILATATION AND CURETTAGE;  Surgeon: Allie Bossier, MD;  Location: [facility] OR;  Service: Gynecology;  Laterality: N/A;   INTRAVASCULAR ULTRASOUND  11/19/2012   Procedure: INTRAVASCULAR ULTRASOUND;  Surgeon: Peter M Swaziland, MD;  Location: [facility] CATH LAB;  Service: Cardiovascular;;   LEFT HEART CATHETERIZATION WITH CORONARY ANGIOGRAM N/A  11/16/2012   Procedure: LEFT HEART CATHETERIZATION WITH CORONARY ANGIOGRAM;  Surgeon: Peter M Swaziland, MD;  Location: [facility] CATH LAB;  Service: Cardiovascular;  Laterality: N/A;   PERCUTANEOUS CORONARY INTERVENTION-BALLOON ONLY  11/19/2012   Procedure: PERCUTANEOUS CORONARY INTERVENTION-BALLOON ONLY;  Surgeon: Peter M Swaziland, MD;  Location: [facility] CATH LAB;  Service: Cardiovascular;;   Patient Active Problem List   Diagnosis Date Noted   Epidermal cyst 04/24/2021   Specimen satisfactory for evaluation but limited by absence of endocervical or transformation zone component from patient with cervix 09/30/2020   Symptomatic anemia 03/12/2019   Hirsutism 04/17/2017   Prediabetes 08/18/2015   Blurry vision, bilateral 08/18/2015   Abnormal uterine bleeding (AUB) 08/11/2015   Obesity 11/11/2013   Thrombocytosis 08/31/2013   Ganglion cyst of wrist 06/12/2013   Hyperlipidemia 12/03/2012   CAD S/P percutaneous coronary angioplasty 11/30/2012   Rheumatoid arthritis flare (HCC) 11/14/2012   Anemia, iron deficiency 11/14/2012    REFERRING DIAG: rheumatoid arthritis involving multiple sites with positive rheumatoid factor   THERAPY DIAG:  Difficulty in walking, not elsewhere classified  Muscle weakness (generalized)  Pain in left elbow  Pain in right ankle and joints of right foot  Rationale for Evaluation and Treatment: Rehabilitation  PERTINENT HISTORY: Patient is a 52  y.o. female who presents to outpatient physical therapy with a referral for medical diagnosis rheumatoid arthritis involving multiple sites with positive rheumatoid factor. This patient's chief complaints consist of pain in her right ankle/foot, left knee, left elbow, left neck and jaw as well as generalized discomfort and difficulty with mobility and balance leading to the following functional deficits: difficulty moving around, doing her daily tasks such as working, shopping, going up and down stairs, using her hands, dancing, spending  time with grandchildren, getting up and down from the floor, keeping her balance. 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  Patient denies hx of cancer, stroke, seizures, lung problems, diabetes, unexplained weight loss, unexplained changes in bowel or bladder problems, osteoporosis, and spinal surgery   PRECAUTIONS:  active severe Stage 3 rheumatoid arthritis with positive rheumatoid factor, fall risk.   SUBJECTIVE:                                                                                                                                                                                      SUBJECTIVE STATEMENT:  Pt reports that she has had soreness after aquatic sessions, "but I like it".  Pt reports she is able to do exercises in the water that she is unable to complete on land. She reports she has a local pool she thinks she can utilize to complete water exercises.    PAIN:  Are you in pain? Yes NPRS:  3-4/10 Location: L elbow, R ankle, Lt knee .     OBJECTIVE:   TODAY'S TREATMENT:    Pt seen for aquatic therapy today.  Treatment took place in water 3.5-4.75 ft in depth at the [facility] pool. Temp of water was 91.  Pt entered/exited the pool via stairs independently in step-to pattern with bilat rail. * holding hand floats -> noodle-> barbell:  multiple laps walking forward / backward  * holding barbell:  side stepping L/R - 3 laps; 3 way toe tap 2 x 5, with cues for TrA set; staggered stance and row with barbell * staggered stance with kick board row x 5 each LE forward *walking forward and back with reciprocal arm swing,  side stepping with arm addct / abdct; walking with alternating row arms with yellow hand floats * standard stance with bilat shoulder horiz abdct / addct (cues to straighten elbows as  tolerated) * holding wall:  relaxed squats x 8;  partial heel toe raises x10;  * without support:  hip abdct/ addct 2 x5; hamstring curls, alternating LEs  * quad stretch  L/R x 20s each, standing on blue step with foot on bench behind her   Pt requires the buoyancy and hydrostatic pressure of water for support, and to offload joints by unweighting joint load by at least 50 % in navel deep water and by at least 75-80% in chest to neck deep water.  Viscosity of the water is needed for resistance of strengthening. Water current perturbations provides challenge to standing balance requiring increased core activation.     PATIENT EDUCATION:  Education details: Exercise purpose/form. Person educated: Patient Education method: Explanation Education comprehension: verbalized understanding and needs further education   HOME EXERCISE PROGRAM: Access Code: [email or web address] URL: [email or web address] Date: 09/30/2022 Prepared by: Norton Blizzard  Exercises - Seated Upper Trapezius Stretch  - 2 x daily - 2-3 reps - 30 seconds hold - Seated Cervical Rotation AROM  - 2 x daily - 2 sets - 10 reps - Seated Scapular Retraction  - 1-2 x daily - 2 sets - 10 reps - 5 seconds hold - Prone Cervical Retraction  - 1 x daily - 3 sets - 10 reps - 1-5 seconds hold - Standing Toe Taps  - 3-5 x weekly - 3 sets - 15 reps - Walking Tandem Stance  - 3-5 x weekly - 1-2 sets - 10 reps   ASSESSMENT:   CLINICAL IMPRESSION: Pt appears initially guarded while ambulating in water;  acclimates within minutes and has more natural pattern. She reports some discomfort in UE with holding to wall with execution of standing exercises, switching to no UE support with reduction of discomfort and adds to balance challenge. Kept to 5 rep sets with good tolerance. Plan to create an aquatic HEP for next session for pt to continue on independently at a local pool; pt in agreement. Progressing gradually towards goals.     From  initial PT evaluation from 09/04/2022:  Patient is a 52 y.o. female referred to outpatient physical therapy with a medical diagnosis of rheumatoid arthritis involving multiple sites with positive rheumatoid factor who presents with signs and symptoms consistent with multi-joint pain and mobility/balance deficits related to rheumatoid arthritis. Patient would benefit from more specific examination of neck pain and balance at future sessions. Patient presents with significant pain, ROM, joint stiffness, posture, muscle performance (strength/power/endurance), gait, balance, muscle tension, and activity tolerance impairments that are limiting ability to complete activities such as moving around, doing her daily tasks such as working, shopping, going up and down stairs, using her hands, dancing, spending time with grandchildren, getting up and down from the floor, keeping her balance without difficulty. She would benefit from the buoyancy and viscosity of water and is a good candidate for aquatic therapy. Patient will benefit from skilled physical therapy intervention to address current body structure impairments and activity limitations to improve function and work towards goals set in current POC in order to return to prior level of function or maximal functional improvement.      OBJECTIVE IMPAIRMENTS: Abnormal gait, decreased activity tolerance, decreased balance, decreased endurance, decreased knowledge of condition, decreased knowledge of use of DME, decreased mobility, difficulty walking, decreased ROM, decreased strength, hypomobility, increased edema, impaired perceived functional ability, increased muscle spasms, impaired flexibility, impaired UE functional use, improper body mechanics, postural dysfunction, obesity, and pain.    ACTIVITY LIMITATIONS: carrying, lifting, bending, standing, squatting, stairs, transfers, bed mobility, dressing, reach over head, hygiene/grooming, and locomotion level    PARTICIPATION LIMITATIONS: interpersonal relationship, community activity, occupation, and   difficulty moving around, doing her daily  tasks such as working, shopping, going up and down stairs, using her hands, dancing, spending time with grandchildren, getting up and down from the floor, keeping her balance   PERSONAL FACTORS: Fitness, Past/current experiences, Profession, Time since onset of injury/illness/exacerbation, Transportation, and 3+ comorbidities:   anemia, CAD, esophagitis, menorrhagia, NSTEMI (and cardiac cath 2014), active severe Stage 3 rheumatoid arthritis with positive rheumatoid factor are also affecting patient's functional outcome.    REHAB POTENTIAL: Good   CLINICAL DECISION MAKING: Evolving/moderate complexity   EVALUATION COMPLEXITY: Moderate     GOALS: Goals reviewed with patient? No   SHORT TERM GOALS: Target date: 09/18/2022   Patient will be independent with initial home exercise program for self-management of symptoms. Baseline: Initial HEP to be provided at visit 2 as appropriate (09/04/22); Goal status: In-progress     LONG TERM GOALS: Target date: 11/27/2022   Patient will be independent with a long-term home exercise program for self-management of symptoms.  Baseline: Initial HEP to be provided at visit 2 as appropriate (09/04/22); Goal status: In-progress   2.  Patient will demonstrate improved FOTO to equal or greater than 40 by visit #12 to demonstrate improvement in overall condition and self-reported functional ability.  Baseline: 50 (09/04/22); Goal status: In-progress   3.  Patient will complete floor transfer test in equal or less than 8.8 seconds to demonstrate improved ability to get up and down from the floor so she can play with her grandchildren.  Baseline: 24 seconds (09/04/22); Goal status: In-progress   4.  Patient will ambulate equal or greater than 1.2 m/s in the 10 Meter Walking Trial to improve her safety when walking across an  intersection. Baseline: 0.99 m/s (09/04/22); Goal status: In-progress   5.  Patient will report decreased pain to equal or less than 3/10 during functional activities to improve her ability to work, and do leisure activities.  Baseline: up to 8/10 (09/04/22); Goal status: In-progress   6.  Patient will ascend and descend four 6 inch steps using step over step technique with no evidence of lack of control and no use of B UE to improve her ability to use stairs at her home while carrying items and with decreased fall risk.  Baseline: needs B UE support and lacks eccentric control (09/04/2022);  Goal status: In-progress     PLAN:   PT FREQUENCY: 1-2x/week   PT DURATION: 12 weeks   PLANNED INTERVENTIONS: Therapeutic exercises, Therapeutic activity, Neuromuscular re-education, Balance training, Gait training, Patient/Family education, Self Care, Joint mobilization, Stair training, DME instructions, Aquatic Therapy, Dry Needling, Electrical stimulation, Spinal mobilization, Cryotherapy, Moist heat, Manual therapy, and Re-evaluation.   PLAN FOR NEXT SESSION: aquatic therapy, update HEP as appropriate, progressive posture/LE/UE/core/functional strength and ROM exercises and balance exercises as appropriate. Education.  Mayer Camel, PTA 10/08/22 5:54 PM [facility] [address] [place], [place], [address] Phone: [phone]   Fax:  [phone]

## 2022-10-10 ENCOUNTER — Encounter: Payer: Self-pay | Admitting: Physical Therapy

## 2022-10-10 ENCOUNTER — Ambulatory Visit: Payer: Medicaid Other | Attending: Rheumatology | Admitting: Physical Therapy

## 2022-10-10 DIAGNOSIS — M6281 Muscle weakness (generalized): Secondary | ICD-10-CM | POA: Diagnosis not present

## 2022-10-10 DIAGNOSIS — M25522 Pain in left elbow: Secondary | ICD-10-CM | POA: Diagnosis not present

## 2022-10-10 DIAGNOSIS — G8929 Other chronic pain: Secondary | ICD-10-CM | POA: Diagnosis not present

## 2022-10-10 DIAGNOSIS — R262 Difficulty in walking, not elsewhere classified: Secondary | ICD-10-CM | POA: Insufficient documentation

## 2022-10-10 DIAGNOSIS — M25562 Pain in left knee: Secondary | ICD-10-CM | POA: Insufficient documentation

## 2022-10-10 DIAGNOSIS — Z7689 Persons encountering health services in other specified circumstances: Secondary | ICD-10-CM | POA: Diagnosis not present

## 2022-10-10 DIAGNOSIS — M542 Cervicalgia: Secondary | ICD-10-CM | POA: Insufficient documentation

## 2022-10-10 DIAGNOSIS — M25571 Pain in right ankle and joints of right foot: Secondary | ICD-10-CM | POA: Diagnosis not present

## 2022-10-10 NOTE — Therapy (Signed)
OUTPATIENT PHYSICAL THERAPY TREATMENT NOTE   Patient Name: Abigail Garcia MRN: [id number] DOB:19-Aug-1970, 52 y.o., female Today's Date: 10/10/2022  PCP: no PCP  REFERRING PROVIDER: Defoor, Dionne Ano, PA-C (Rheumatology)   END OF SESSION:   PT End of Session - 10/10/22 0927     Visit Number 8    Number of Visits 12    Date for PT Re-Evaluation 11/27/22    Authorization Type [place] MEDICA[employer] reporting period from 09/04/2022    Authorization Time Period [employer] auth#[id number] 4/15-7/30 for 12 PT visits    Authorization - Visit Number 7    Authorization - Number of Visits 12    Progress Note Due on Visit 10    PT Start Time 0945    PT Stop Time 1025    PT Time Calculation (min) 40 min    Activity Tolerance Patient tolerated treatment well    Behavior During Therapy [facility] for tasks assessed/performed                Past Medical History:  Diagnosis Date   Anemia    CAD (coronary artery disease)    a. 11/2012 NSTEMI/Cath/PCI: LM nl, LAD 80-90 thrombotic (tx with Heparin x 2 days then aspiration thrombectomy and PTCA), RI nl, LCX sm/nl, RCA nl, EF 55-65%.   Esophagitis    grade 1   Hx of echocardiogram    a. Echo (6/14) with EF 55-60%.    Menorrhagia    NSTEMI (non-ST elevated myocardial infarction) (HCC)    11/2012    Obesity    RA (rheumatoid arthritis) (HCC)    Past Surgical History:  Procedure Laterality Date   bilateral tubal     CARDIAC CATHETERIZATION  2014   CESAREAN SECTION     CORONARY ANGIOGRAM  11/19/2012   Procedure: CORONARY ANGIOGRAM;  Surgeon: Peter M Swaziland, MD;  Location: [facility] CATH LAB;  Service: Cardiovascular;;   DILATION AND CURETTAGE OF UTERUS     DILATION AND CURETTAGE OF UTERUS N/A 03/13/2019   Procedure: DILATATION AND CURETTAGE;  Surgeon: Allie Bossier, MD;  Location: [facility] OR;  Service: Gynecology;  Laterality: N/A;   INTRAVASCULAR ULTRASOUND  11/19/2012   Procedure: INTRAVASCULAR ULTRASOUND;  Surgeon: Peter M Swaziland, MD;  Location: [facility] CATH LAB;   Service: Cardiovascular;;   LEFT HEART CATHETERIZATION WITH CORONARY ANGIOGRAM N/A 11/16/2012   Procedure: LEFT HEART CATHETERIZATION WITH CORONARY ANGIOGRAM;  Surgeon: Peter M Swaziland, MD;  Location: [facility] CATH LAB;  Service: Cardiovascular;  Laterality: N/A;   PERCUTANEOUS CORONARY INTERVENTION-BALLOON ONLY  11/19/2012   Procedure: PERCUTANEOUS CORONARY INTERVENTION-BALLOON ONLY;  Surgeon: Peter M Swaziland, MD;  Location[facility] CATH LAB;  Service: Cardiovascular;;   Patient Active Problem List   Diagnosis Date Noted   Epidermal cyst 04/24/2021   Specimen satisfactory for evaluation but limited by absence of endocervical or transformation zone component from patient with cervix 09/30/2020   Symptomatic anemia 03/12/2019   Hirsutism 04/17/2017   Prediabetes 08/18/2015   Blurry vision, bilateral 08/18/2015   Abnormal uterine bleeding (AUB) 08/11/2015   Obesity 11/11/2013   Thrombocytosis 08/31/2013   Ganglion cyst of wrist 06/12/2013   Hyperlipidemia 12/03/2012   CAD S/P percutaneous coronary angioplasty 11/30/2012   Rheumatoid arthritis flare (HCC) 11/14/2012   Anemia, iron deficiency 11/14/2012    REFERRING DIAG: rheumatoid arthritis involving multiple sites with positive rheumatoid factor   THERAPY DIAG:  Difficulty in walking, not elsewhere classified  Muscle weakness (generalized)  Pain in left elbow  Pain in right ankle and joints  of right foot  Chronic pain of left knee  Cervicalgia  Rationale for Evaluation and Treatment: Rehabilitation  PERTINENT HISTORY: Patient is a 51 y.o. female who presents to outpatient physical therapy with a referral for medical diagnosis rheumatoid arthritis involving multiple sites with positive rheumatoid factor. This patient's chief complaints consist of pain in her right ankle/foot, left knee, left elbow, left neck and jaw as well as generalized discomfort and difficulty with mobility and balance leading to the following functional deficits:  difficulty moving around, doing her daily tasks such as working, shopping, going up and down stairs, using her hands, dancing, spending time with grandchildren, getting up and down from the floor, keeping her balance. 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  Patient denies hx of cancer, stroke, seizures, lung problems, diabetes, unexplained weight loss, unexplained changes in bowel or bladder problems, osteoporosis, and spinal surgery   PRECAUTIONS:  active severe Stage 3 rheumatoid arthritis with positive rheumatoid factor, fall risk.   SUBJECTIVE:                                                                                                                                                                                      SUBJECTIVE STATEMENT:  Patient reports she experienced a flair up over the weekend and she felt worse after her last PT appointment at the pool. She states the pool therapy has been going well overall and the aquatic PT is supposed to give her a HEP next week to continue working in the water around here. Her neck is not bad. She has been doing her neck HEP some. She states she does not have as much pain as before in her neck.    PAIN:  Are you in pain? Yes NPRS:  5/10 Location: L elbow, R ankle, Lt knee .     OBJECTIVE:   TODAY'S TREATMENT:    Therapeutic exercise: to centralize symptoms and improve ROM, strength, muscular endurance, and activity tolerance required for successful completion of functional activities.  - NuStep level 1 using bilateral upper and lower extremities. Seat/handle setting 9/9. For improved extremity mobility, muscular endurance, and activity tolerance; and to induce the analgesic effect of aerobic exercise, stimulate improved joint nutrition, and prepare body structures and systems for  following interventions. x 5 minutes. Average SPM = 45. - seated lumbar flexion roll out, 1x90 seconds at self selected pace.   Manual therapy: to reduce pain and tissue tension, improve range of motion, neuromodulation, in order to promote improved ability to complete functional activities. SUPINE with legs  on wedge for comfort - STM to posterior cervical spine musculature focusing on B UT, cervical spine paraspinals, and suboccipital region.  - L elbow distraction in 90 degrees flexion, 3x30 seconds grade III-IV - L elbow PROM flexion, forearm pronation/supination, gentle posterolateral glide of radial head grade II-III (discontinued due to discomfort).  - L patellar mobilizations in slight flexion, grade III-IV in all directions to tolerance.  - R ankle distraction grade II-IV, 3x30 seconds SEATED - L tibiofemoral distraction, 3x30 seconds, grade III-IV  Pt required multimodal cuing for proper technique and to facilitate improved neuromuscular control, strength, range of motion, and functional ability resulting in improved performance and form.  PATIENT EDUCATION:  Education details: Exercise purpose/form. Person educated: Patient Education method: Explanation Education comprehension: verbalized understanding and needs further education   HOME EXERCISE PROGRAM: Access Code: [email or web address] URL: [email or web address] Date: 09/30/2022 Prepared by: Norton Blizzard  Exercises - Seated Upper Trapezius Stretch  - 2 x daily - 2-3 reps - 30 seconds hold - Seated Cervical Rotation AROM  - 2 x daily - 2 sets - 10 reps - Seated Scapular Retraction  - 1-2 x daily - 2 sets - 10 reps - 5 seconds hold - Prone Cervical Retraction  - 1 x daily - 3 sets - 10 reps - 1-5 seconds hold - Standing Toe Taps  - 3-5 x weekly - 3 sets - 15 reps - Walking Tandem Stance  - 3-5 x weekly - 1-2 sets - 10 reps   ASSESSMENT:   CLINICAL IMPRESSION: Patient arrives reporting significant flair in pain that  was worse after her last PT session in the pool. Kept exercises very gentle today and utilized gentle manual therapy to attempt to help with pain control at her most tender joints. Pateint reports no change in pain by end of session. Plan for her to return to aquatic PT next session to learn HEP, then she will try it out at a local pool before returning for one more aquatic and land appointment to help with any difficulties she runs into with HEP. Patient would benefit from continued management of limiting condition by skilled physical therapist to address remaining impairments and functional limitations to work towards stated goals and return to PLOF or maximal functional independence.   From initial PT evaluation from 09/04/2022:  Patient is a 52 y.o. female referred to outpatient physical therapy with a medical diagnosis of rheumatoid arthritis involving multiple sites with positive rheumatoid factor who presents with signs and symptoms consistent with multi-joint pain and mobility/balance deficits related to rheumatoid arthritis. Patient would benefit from more specific examination of neck pain and balance at future sessions. Patient presents with significant pain, ROM, joint stiffness, posture, muscle performance (strength/power/endurance), gait, balance, muscle tension, and activity tolerance impairments that are limiting ability to complete activities such as moving around, doing her daily tasks such as working, shopping, going up and down stairs, using her hands, dancing, spending time with grandchildren, getting up and down from the floor, keeping her balance without difficulty. She would benefit from the buoyancy and viscosity of water and is a good candidate for aquatic therapy. Patient will benefit from skilled physical therapy intervention to address current body structure impairments and activity limitations to improve function and work towards goals set in current POC in order to return to prior  level of function or maximal functional improvement.      OBJECTIVE IMPAIRMENTS: Abnormal gait, decreased activity tolerance, decreased balance, decreased endurance, decreased knowledge of condition, decreased knowledge of  use of DME, decreased mobility, difficulty walking, decreased ROM, decreased strength, hypomobility, increased edema, impaired perceived functional ability, increased muscle spasms, impaired flexibility, impaired UE functional use, improper body mechanics, postural dysfunction, obesity, and pain.    ACTIVITY LIMITATIONS: carrying, lifting, bending, standing, squatting, stairs, transfers, bed mobility, dressing, reach over head, hygiene/grooming, and locomotion level   PARTICIPATION LIMITATIONS: interpersonal relationship, community activity, occupation, and   difficulty moving around, doing her daily tasks such as working, shopping, going up and down stairs, using her hands, dancing, spending time with grandchildren, getting up and down from the floor, keeping her balance   PERSONAL FACTORS: Fitness, Past/current experiences, Profession, Time since onset of injury/illness/exacerbation, Transportation, and 3+ comorbidities:   anemia, CAD, esophagitis, menorrhagia, NSTEMI (and cardiac cath 2014), active severe Stage 3 rheumatoid arthritis with positive rheumatoid factor are also affecting patient's functional outcome.    REHAB POTENTIAL: Good   CLINICAL DECISION MAKING: Evolving/moderate complexity   EVALUATION COMPLEXITY: Moderate     GOALS: Goals reviewed with patient? No   SHORT TERM GOALS: Target date: 09/18/2022   Patient will be independent with initial home exercise program for self-management of symptoms. Baseline: Initial HEP to be provided at visit 2 as appropriate (09/04/22); Goal status: In-progress     LONG TERM GOALS: Target date: 11/27/2022   Patient will be independent with a long-term home exercise program for self-management of symptoms.  Baseline:  Initial HEP to be provided at visit 2 as appropriate (09/04/22); Goal status: In-progress   2.  Patient will demonstrate improved FOTO to equal or greater than 40 by visit #12 to demonstrate improvement in overall condition and self-reported functional ability.  Baseline: 50 (09/04/22); Goal status: In-progress   3.  Patient will complete floor transfer test in equal or less than 8.8 seconds to demonstrate improved ability to get up and down from the floor so she can play with her grandchildren.  Baseline: 24 seconds (09/04/22); Goal status: In-progress   4.  Patient will ambulate equal or greater than 1.2 m/s in the 10 Meter Walking Trial to improve her safety when walking across an intersection. Baseline: 0.99 m/s (09/04/22); Goal status: In-progress   5.  Patient will report decreased pain to equal or less than 3/10 during functional activities to improve her ability to work, and do leisure activities.  Baseline: up to 8/10 (09/04/22); Goal status: In-progress   6.  Patient will ascend and descend four 6 inch steps using step over step technique with no evidence of lack of control and no use of B UE to improve her ability to use stairs at her home while carrying items and with decreased fall risk.  Baseline: needs B UE support and lacks eccentric control (09/04/2022);  Goal status: In-progress     PLAN:   PT FREQUENCY: 1-2x/week   PT DURATION: 12 weeks   PLANNED INTERVENTIONS: Therapeutic exercises, Therapeutic activity, Neuromuscular re-education, Balance training, Gait training, Patient/Family education, Self Care, Joint mobilization, Stair training, DME instructions, Aquatic Therapy, Dry Needling, Electrical stimulation, Spinal mobilization, Cryotherapy, Moist heat, Manual therapy, and Re-evaluation.   PLAN FOR NEXT SESSION: aquatic therapy, update HEP as appropriate, progressive posture/LE/UE/core/functional strength and ROM exercises and balance exercises as appropriate.  Education.  Luretha Murphy. Ilsa Iha, PT, DPT 10/10/22, 10:48 AM  [facility] [address] [place], [place] [address] P: [phone] I F: [phone]

## 2022-10-15 ENCOUNTER — Encounter (HOSPITAL_BASED_OUTPATIENT_CLINIC_OR_DEPARTMENT_OTHER): Payer: Self-pay | Admitting: Physical Therapy

## 2022-10-15 ENCOUNTER — Ambulatory Visit (HOSPITAL_BASED_OUTPATIENT_CLINIC_OR_DEPARTMENT_OTHER): Payer: Medicaid Other | Admitting: Physical Therapy

## 2022-10-15 ENCOUNTER — Encounter: Payer: Medicaid Other | Admitting: Physical Therapy

## 2022-10-15 DIAGNOSIS — M0579 Rheumatoid arthritis with rheumatoid factor of multiple sites without organ or systems involvement: Secondary | ICD-10-CM | POA: Diagnosis not present

## 2022-10-15 DIAGNOSIS — M06022 Rheumatoid arthritis without rheumatoid factor, left elbow: Secondary | ICD-10-CM | POA: Diagnosis not present

## 2022-10-15 DIAGNOSIS — Z7689 Persons encountering health services in other specified circumstances: Secondary | ICD-10-CM | POA: Diagnosis not present

## 2022-10-15 DIAGNOSIS — Z79899 Other long term (current) drug therapy: Secondary | ICD-10-CM | POA: Diagnosis not present

## 2022-10-17 ENCOUNTER — Encounter: Payer: Self-pay | Admitting: Physical Therapy

## 2022-10-17 ENCOUNTER — Ambulatory Visit: Payer: Medicaid Other | Admitting: Physical Therapy

## 2022-10-17 DIAGNOSIS — M6281 Muscle weakness (generalized): Secondary | ICD-10-CM | POA: Diagnosis not present

## 2022-10-17 DIAGNOSIS — M25571 Pain in right ankle and joints of right foot: Secondary | ICD-10-CM

## 2022-10-17 DIAGNOSIS — Z7689 Persons encountering health services in other specified circumstances: Secondary | ICD-10-CM | POA: Diagnosis not present

## 2022-10-17 DIAGNOSIS — M25522 Pain in left elbow: Secondary | ICD-10-CM | POA: Diagnosis not present

## 2022-10-17 DIAGNOSIS — R262 Difficulty in walking, not elsewhere classified: Secondary | ICD-10-CM | POA: Diagnosis not present

## 2022-10-17 DIAGNOSIS — G8929 Other chronic pain: Secondary | ICD-10-CM

## 2022-10-17 DIAGNOSIS — M25562 Pain in left knee: Secondary | ICD-10-CM | POA: Diagnosis not present

## 2022-10-17 DIAGNOSIS — M542 Cervicalgia: Secondary | ICD-10-CM | POA: Diagnosis not present

## 2022-10-17 NOTE — Therapy (Signed)
OUTPATIENT PHYSICAL THERAPY TREATMENT NOTE / PROGRESS NOTE Dates of reporting from 09/04/2022 to 10/17/2022   Patient Name: Abigail Garcia MRN: [id number] DOB:01/24/1971, 52 y.o., female Today's Date: 10/17/2022  PCP: no PCP  REFERRING PROVIDER: Defoor, Dionne Ano, PA-C (Rheumatology)   END OF SESSION:   PT End of Session - 10/17/22 1619     Visit Number 9    Number of Visits 12    Date for PT Re-Evaluation 11/27/22    Authorization Type [place] [employer] reporting period from 09/04/2022    Authorization Time Period [employer] auth#[id number] 4/15-7/30 for 12 PT visits    Authorization - Visit Number 8    Authorization - Number of Visits 12    Progress Note Due on Visit 10    PT Start Time 0950    PT Stop Time 1030    PT Time Calculation (min) 40 min    Activity Tolerance Patient tolerated treatment well    Behavior During Therapy [facility] for tasks assessed/performed             Past Medical History:  Diagnosis Date   Anemia    CAD (coronary artery disease)    a. 11/2012 NSTEMI/Cath/PCI: LM nl, LAD 80-90 thrombotic (tx with Heparin x 2 days then aspiration thrombectomy and PTCA), RI nl, LCX sm/nl, RCA nl, EF 55-65%.   Esophagitis    grade 1   Hx of echocardiogram    a. Echo (6/14) with EF 55-60%.    Menorrhagia    NSTEMI (non-ST elevated myocardial infarction) (HCC)    11/2012    Obesity    RA (rheumatoid arthritis) (HCC)    Past Surgical History:  Procedure Laterality Date   bilateral tubal     CARDIAC CATHETERIZATION  2014   CESAREAN SECTION     CORONARY ANGIOGRAM  11/19/2012   Procedure: CORONARY ANGIOGRAM;  Surgeon: Peter M Swaziland, MD;  Location: [facility] CATH LAB;  Service: Cardiovascular;;   DILATION AND CURETTAGE OF UTERUS     DILATION AND CURETTAGE OF UTERUS N/A 03/13/2019   Procedure: DILATATION AND CURETTAGE;  Surgeon: Allie Bossier, MD;  Location: [facility] OR;  Service: Gynecology;  Laterality: N/A;   INTRAVASCULAR ULTRASOUND  11/19/2012   Procedure: INTRAVASCULAR  ULTRASOUND;  Surgeon: Peter M Swaziland, MD;  Location: [facility] CATH LAB;  Service: Cardiovascular;;   LEFT HEART CATHETERIZATION WITH CORONARY ANGIOGRAM N/A 11/16/2012   Procedure: LEFT HEART CATHETERIZATION WITH CORONARY ANGIOGRAM;  Surgeon: Peter M Swaziland, MD;  Location: [facility] CATH LAB;  Service: Cardiovascular;  Laterality: N/A;   PERCUTANEOUS CORONARY INTERVENTION-BALLOON ONLY  11/19/2012   Procedure: PERCUTANEOUS CORONARY INTERVENTION-BALLOON ONLY;  Surgeon: Peter M Swaziland, MD;  Location: [facility] CATH LAB;  Service: Cardiovascular;;   Patient Active Problem List   Diagnosis Date Noted   Epidermal cyst 04/24/2021   Specimen satisfactory for evaluation but limited by absence of endocervical or transformation zone component from patient with cervix 09/30/2020   Symptomatic anemia 03/12/2019   Hirsutism 04/17/2017   Prediabetes 08/18/2015   Blurry vision, bilateral 08/18/2015   Abnormal uterine bleeding (AUB) 08/11/2015   Obesity 11/11/2013   Thrombocytosis 08/31/2013   Ganglion cyst of wrist 06/12/2013   Hyperlipidemia 12/03/2012   CAD S/P percutaneous coronary angioplasty 11/30/2012   Rheumatoid arthritis flare (HCC) 11/14/2012   Anemia, iron deficiency 11/14/2012    REFERRING DIAG: rheumatoid arthritis involving multiple sites with positive rheumatoid factor   THERAPY DIAG:  Difficulty in walking, not elsewhere classified  Muscle weakness (generalized)  Pain in left elbow  Pain in right ankle and joints of right foot  Chronic pain of left knee  Cervicalgia  Rationale for Evaluation and Treatment: Rehabilitation  PERTINENT HISTORY: Patient is a 52 y.o. female who presents to outpatient physical therapy with a referral for medical diagnosis rheumatoid arthritis involving multiple sites with positive rheumatoid factor. This patient's chief complaints consist of pain in her right ankle/foot, left knee, left elbow, left neck and jaw as well as generalized discomfort and difficulty with  mobility and balance leading to the following functional deficits: difficulty moving around, doing her daily tasks such as working, shopping, going up and down stairs, using her hands, dancing, spending time with grandchildren, getting up and down from the floor, keeping her balance. 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  Patient denies hx of cancer, stroke, seizures, lung problems, diabetes, unexplained weight loss, unexplained changes in bowel or bladder problems, osteoporosis, and spinal surgery   PRECAUTIONS:  active severe Stage 3 rheumatoid arthritis with positive rheumatoid factor, fall risk.   SUBJECTIVE:                                                                                                                                                                                      SUBJECTIVE STATEMENT:  Patient reports she is feeling better today than last PT session. She missed her last aquatic therapy appointment because of difficulty with transportation in the heavy rain. She states the aquatic therapist is mailing her an aquatic HEP she can use at the pool locally. She plans to try it at the [facility] in [place]. She states she pays more attention to balance and different things she can do to be better since starting PT and feels it is helping her. She went to her rhumatologist Monday who changed her medication. She states her left elbow has been swollen.   PAIN:  Are you in pain? Yes NPRS:  5/10 Location: L elbow.     OBJECTIVE  SELF-REPORTED FUNCTION FOTO score: 61/100 (NOC-Neuromuscular disorder questionnaire)  FUNCTIONAL/BALANCE TESTS:  Ten meter walking trial ( ): self selected pace: 0.92  m/s; fast pace: 0.1 m/s. No AD.  Floor Transfer Test: 16 seconds with use of chair.  Stairs: ascends  four 6-inch steps with B UE using step over step gait pattern, mild compensations to avoid loading knee. Descends four 6 inch steps with B UE and step over step with difficulty controlling eccentric phase.   TODAY'S TREATMENT:    Therapeutic exercise: to centralize symptoms and improve ROM, strength, muscular endurance, and activity tolerance  required for successful completion of functional activities.  - NuStep level 1 using bilateral upper and lower extremities. Seat/handle setting 9/9. For improved extremity mobility, muscular endurance, and activity tolerance; and to induce the analgesic effect of aerobic exercise, stimulate improved joint nutrition, and prepare body structures and systems for following interventions. x 5 minutes. Average SPM = 65. - hooklying low trunk rotation, x5 min  Manual therapy: to reduce pain and tissue tension, improve range of motion, neuromodulation, in order to promote improved ability to complete functional activities. HOOKLYING - L elbow distraction in 90 degrees flexion, 3x30 seconds grade III-IV - L elbow PROM flexion, forearm pronation/supination,  - gentle PA and AP glide of radial head in pronation and supination respectively, grade II-III, 1x30.   Pt required multimodal cuing for proper technique and to facilitate improved neuromuscular control, strength, range of motion, and functional ability resulting in improved performance and form.  PATIENT EDUCATION:  Education details: Exercise purpose/form. Person educated: Patient Education method: Explanation Education comprehension: verbalized understanding and needs further education   HOME EXERCISE PROGRAM: Access Code: [email or web address] URL: [email or web address] Date: 09/30/2022 Prepared by: Norton Blizzard  Exercises - Seated Upper Trapezius Stretch  - 2 x daily - 2-3 reps - 30 seconds hold - Seated Cervical Rotation AROM  - 2 x daily - 2 sets - 10 reps - Seated Scapular Retraction  - 1-2 x  daily - 2 sets - 10 reps - 5 seconds hold - Prone Cervical Retraction  - 1 x daily - 3 sets - 10 reps - 1-5 seconds hold - Standing Toe Taps  - 3-5 x weekly - 3 sets - 15 reps - Walking Tandem Stance  - 3-5 x weekly - 1-2 sets - 10 reps   ASSESSMENT:   CLINICAL IMPRESSION: Patient has attended 9 physical therapy sessions since starting current episode of care on 09/04/2022. She has participated in land and aquatic therapy sessions and reports significant improvement in quality of life that is reflected in her FOTO score of 61 which exceeded her goal of 50 by visit #20. She also improved her floor transfer test time from 23 seconds to 16 seconds. She is still working on getting and implementing her long term aquatic HEP and has not made significant progress towards walking speed or highest pain level with functional activities. She did have an RA flair last week that affected her pain goal. Patient would benefit from completing 3 more visits to make sure she is able to perform her aquatic HEP effectively and safely before discharge .Patient would benefit from continued management of limiting condition by skilled physical therapist to address remaining impairments and functional limitations to work towards stated goals and return to PLOF or maximal functional independence.   From initial PT evaluation from 09/04/2022:  Patient is a 52 y.o. female referred to outpatient physical therapy with a medical diagnosis of rheumatoid arthritis involving multiple sites with positive rheumatoid factor who presents with signs and symptoms consistent with multi-joint pain and mobility/balance deficits related to rheumatoid arthritis. Patient would benefit from more specific examination of neck pain and balance at future sessions. Patient presents with significant pain, ROM, joint stiffness, posture, muscle performance (strength/power/endurance), gait, balance, muscle tension, and activity tolerance impairments that are  limiting ability to complete activities such as moving around, doing her daily tasks such as working, shopping, going up and down stairs, using her hands, dancing, spending time with grandchildren, getting up and down from the floor, keeping her balance without difficulty. She  would benefit from the buoyancy and viscosity of water and is a good candidate for aquatic therapy. Patient will benefit from skilled physical therapy intervention to address current body structure impairments and activity limitations to improve function and work towards goals set in current POC in order to return to prior level of function or maximal functional improvement.      OBJECTIVE IMPAIRMENTS: Abnormal gait, decreased activity tolerance, decreased balance, decreased endurance, decreased knowledge of condition, decreased knowledge of use of DME, decreased mobility, difficulty walking, decreased ROM, decreased strength, hypomobility, increased edema, impaired perceived functional ability, increased muscle spasms, impaired flexibility, impaired UE functional use, improper body mechanics, postural dysfunction, obesity, and pain.    ACTIVITY LIMITATIONS: carrying, lifting, bending, standing, squatting, stairs, transfers, bed mobility, dressing, reach over head, hygiene/grooming, and locomotion level   PARTICIPATION LIMITATIONS: interpersonal relationship, community activity, occupation, and   difficulty moving around, doing her daily tasks such as working, shopping, going up and down stairs, using her hands, dancing, spending time with grandchildren, getting up and down from the floor, keeping her balance   PERSONAL FACTORS: Fitness, Past/current experiences, Profession, Time since onset of injury/illness/exacerbation, Transportation, and 3+ comorbidities:   anemia, CAD, esophagitis, menorrhagia, NSTEMI (and cardiac cath 2014), active severe Stage 3 rheumatoid arthritis with positive rheumatoid factor are also affecting patient's  functional outcome.    REHAB POTENTIAL: Good   CLINICAL DECISION MAKING: Evolving/moderate complexity   EVALUATION COMPLEXITY: Moderate     GOALS: Goals reviewed with patient? No   SHORT TERM GOALS: Target date: 09/18/2022   Patient will be independent with initial home exercise program for self-management of symptoms. Baseline: Initial HEP to be provided at visit 2 as appropriate (09/04/22); Goal status: In-progress     LONG TERM GOALS: Target date: 11/27/2022   Patient will be independent with a long-term home exercise program for self-management of symptoms.  Baseline: Initial HEP to be provided at visit 2 as appropriate (09/04/22); participating intermittently with room for improvement, is waiting for aquatic HEP in the mail to start going to a local pool (10/17/2022);   Goal status: In-progress   2.  Patient will demonstrate improved FOTO to equal or greater than 50 by visit #12 to demonstrate improvement in overall condition and self-reported functional ability.  Baseline: 40 (09/04/22); 61 at visit # 9 (10/17/2022);  Goal status: MET   3.  Patient will complete floor transfer test in equal or less than 8.8 seconds to demonstrate improved ability to get up and down from the floor so she can play with her grandchildren.  Baseline: 24 seconds (09/04/22); 16 seconds (10/17/2022);  Goal status: In-progress   4.  Patient will ambulate equal or greater than 1.2 m/s in the 10 Meter Walking Trial to improve her safety when walking across an intersection. Baseline: 0.99 m/s (09/04/22); 1 m/s (10/17/2022);  Goal status: In-progress   5.  Patient will report decreased pain to equal or less than 3/10 during functional activities to improve her ability to work, and do leisure activities.  Baseline: up to 8/10 (09/04/22); up to 9/10 in the last two weeks during a recent RA flair (10/17/2022);  Goal status: In-progress   6.  Patient will ascend and descend four 6 inch steps using step over  step technique with no evidence of lack of control and no use of B UE to improve her ability to use stairs at her home while carrying items and with decreased fall risk.  Baseline: needs B UE support and lacks  eccentric control (09/04/2022); continues to need same support, but more confident with increased speed (10/17/2022);  Goal status: In-progress     PLAN:   PT FREQUENCY: 1-2x/week   PT DURATION: 12 weeks   PLANNED INTERVENTIONS: Therapeutic exercises, Therapeutic activity, Neuromuscular re-education, Balance training, Gait training, Patient/Family education, Self Care, Joint mobilization, Stair training, DME instructions, Aquatic Therapy, Dry Needling, Electrical stimulation, Spinal mobilization, Cryotherapy, Moist heat, Manual therapy, and Re-evaluation.   PLAN FOR NEXT SESSION: aquatic therapy, update HEP as appropriate, progressive posture/LE/UE/core/functional strength and ROM exercises and balance exercises as appropriate. Education.  Luretha Murphy. Ilsa Iha, PT, DPT 10/17/22, 4:32 PM  [facility] [address] [place], [place] [address] P: [phone] I F: [phone]

## 2022-10-22 ENCOUNTER — Encounter: Payer: Self-pay | Admitting: Family Medicine

## 2022-10-22 ENCOUNTER — Ambulatory Visit: Payer: Medicaid Other | Admitting: Family Medicine

## 2022-10-22 VITALS — BP 121/84 | HR 99 | Temp 98.2°F | Resp 16 | Ht 63.0 in | Wt 253.6 lb

## 2022-10-22 DIAGNOSIS — Z0000 Encounter for general adult medical examination without abnormal findings: Secondary | ICD-10-CM

## 2022-10-22 DIAGNOSIS — R5383 Other fatigue: Secondary | ICD-10-CM

## 2022-10-22 DIAGNOSIS — E782 Mixed hyperlipidemia: Secondary | ICD-10-CM

## 2022-10-22 DIAGNOSIS — Z7689 Persons encountering health services in other specified circumstances: Secondary | ICD-10-CM | POA: Diagnosis not present

## 2022-10-22 DIAGNOSIS — N938 Other specified abnormal uterine and vaginal bleeding: Secondary | ICD-10-CM | POA: Diagnosis not present

## 2022-10-22 DIAGNOSIS — L680 Hirsutism: Secondary | ICD-10-CM

## 2022-10-22 DIAGNOSIS — Z1159 Encounter for screening for other viral diseases: Secondary | ICD-10-CM

## 2022-10-22 DIAGNOSIS — Z6841 Body Mass Index (BMI) 40.0 and over, adult: Secondary | ICD-10-CM | POA: Diagnosis not present

## 2022-10-22 DIAGNOSIS — I2510 Atherosclerotic heart disease of native coronary artery without angina pectoris: Secondary | ICD-10-CM | POA: Diagnosis not present

## 2022-10-22 DIAGNOSIS — R7303 Prediabetes: Secondary | ICD-10-CM | POA: Diagnosis not present

## 2022-10-22 DIAGNOSIS — Z28310 Unvaccinated for covid-19: Secondary | ICD-10-CM

## 2022-10-22 DIAGNOSIS — D500 Iron deficiency anemia secondary to blood loss (chronic): Secondary | ICD-10-CM | POA: Diagnosis not present

## 2022-10-22 DIAGNOSIS — L720 Epidermal cyst: Secondary | ICD-10-CM | POA: Insufficient documentation

## 2022-10-22 DIAGNOSIS — Z2821 Immunization not carried out because of patient refusal: Secondary | ICD-10-CM | POA: Diagnosis not present

## 2022-10-22 NOTE — Assessment & Plan Note (Signed)
Reports previously elevated lipid levels  No current statin therapy Will repeat lipid levels today

## 2022-10-22 NOTE — Assessment & Plan Note (Signed)
Hep C and Hiv screening collected today

## 2022-10-22 NOTE — Assessment & Plan Note (Signed)
Exam consistent with epidermal cyst  Referral submitted to dermatology per patient request

## 2022-10-22 NOTE — Assessment & Plan Note (Signed)
Reports continued vaginal bleeding  Following with GYN, recommended scheduling follow up  Will collect CBC to check hgb

## 2022-10-22 NOTE — Assessment & Plan Note (Signed)
Welcomed patient to [facility]  Reviewed patient's medical history, medications, surgical and social history Discussed roles and expectations for primary care physician-patient relationship Recommended patient schedule annual preventative examinations   

## 2022-10-22 NOTE — Assessment & Plan Note (Signed)
Chronic  BMI 44.92 CMP, lipid panel, hx of pre-DM- A1c ordered today  TSH and free T4 collected  Vitamin B12 and vitamin D collected today

## 2022-10-22 NOTE — Progress Notes (Signed)
I,Abigail Garcia,acting as a scribe for [employer], MD.,have documented all relevant documentation on the behalf of [name], MD,as directed by  [name], MD while in the presence of [name], MD.  New patient visit   Patient: Abigail Garcia   DOB: 12/28/1970   52 y.o. Female  MRN: [id number] Visit Date: 10/22/2022  Today's healthcare provider: [name], MD   Chief Complaint  Patient presents with   Establish Care   Subjective    Abigail Garcia is a 52 y.o. female who presents today as a new patient to establish care.  HPI   Encounter to Establish Care Patient presents to establish care  Introduced myself and my role as primary care physician  We reviewed patient's medical, surgical, and social history and medications as listed below    PMHX   Last annual physical: 2021   Rheumatoid Arthritis  Medications: Humira weekly, methotrexate, Plaquenil Followed by Rheumatology, last visit 10/15/22, normal creatinine and eGFR, sees Chrissie Noa Defoor PA   Dysfunctional Uterine Bleeding  Has been on Megace and has been bleeding daily  She is concerned that her iron is low States she has been on Megace since 2020  Reports she has not been to her GYN (Dr. Macon Large, Ad[facility] Peals) again    Reports having hx of lymphatic issues  She reports that LN are swollen and thinks it may be related to    She reports that she was diagnosed with a dental infection  She reports that she was told she has an in  Would like to establish with cardiology for hx of MI, CAD,   Social Hx  Tobacco use: No   Alcohol Use : No Illicit drug use: No    Concerns for Today:   Patient would like to have labs done today and would like to be refer to a Dermatologist for a large bump on her back that has been present for several years  She states that she can not see the area nor can she reach it, she denies tenderness or pruritus, she  states it looks like a large black head    Past Medical History:  Diagnosis Date   Anemia    Blood transfusion without reported diagnosis    CAD (coronary artery disease)    a. 11/2012 NSTEMI/Cath/PCI: LM nl, LAD 80-90 thrombotic (tx with Heparin x 2 days then aspiration thrombectomy and PTCA), RI nl, LCX sm/nl, RCA nl, EF 55-65%.   Esophagitis    grade 1   Hx of echocardiogram    a. Echo (6/14) with EF 55-60%.    Menorrhagia    NSTEMI (non-ST elevated myocardial infarction) (HCC)    11/2012    Obesity    RA (rheumatoid arthritis) (HCC)    Past Surgical History:  Procedure Laterality Date   bilateral tubal     CARDIAC CATHETERIZATION  2014   CESAREAN SECTION     CORONARY ANGIOGRAM  11/19/2012   Procedure: CORONARY ANGIOGRAM;  Surgeon: Peter M Swaziland, MD;  Location: [facility] CATH LAB;  Service: Cardiovascular;;   DILATION AND CURETTAGE OF UTERUS     DILATION AND CURETTAGE OF UTERUS N/A 03/13/2019   Procedure: DILATATION AND CURETTAGE;  Surgeon: Allie Bossier, MD;  Location: [facility] OR;  Service: Gynecology;  Laterality: N/A;   INTRAVASCULAR ULTRASOUND  11/19/2012   Procedure: INTRAVASCULAR ULTRASOUND;  Surgeon: Peter M Swaziland, MD;  Location: [facility] CATH LAB;  Service: Cardiovascular;;   LEFT HEART CATHETERIZATION WITH CORONARY ANGIOGRAM N/A 11/16/2012  Procedure: LEFT HEART CATHETERIZATION WITH CORONARY ANGIOGRAM;  Surgeon: Peter M Swaziland, MD;  Location: [facility] CATH LAB;  Service: Cardiovascular;  Laterality: N/A;   PERCUTANEOUS CORONARY INTERVENTION-BALLOON ONLY  11/19/2012   Procedure: PERCUTANEOUS CORONARY INTERVENTION-BALLOON ONLY;  Surgeon: Peter M Swaziland, MD;  Location: [facility] CATH LAB;  Service: Cardiovascular;;   Family Status  Relation Name Status   Mother  Alive   Father  Deceased       murdered   Other  (Not Specified)   PGM  Deceased   Family History  Problem Relation Age of Onset   Heart disease Other    Breast cancer Paternal Grandmother    Social History   Socioeconomic History    Marital status: Single    Spouse name: Not on file   Number of children: Not on file   Years of education: Not on file   Highest education level: Not on file  Occupational History   Not on file  Tobacco Use   Smoking status: Never   Smokeless tobacco: Never  Vaping Use   Vaping Use: Never used  Substance and Sexual Activity   Alcohol use: No   Drug use: No   Sexual activity: Yes    Birth control/protection: Surgical    Comment: BTL  Other Topics Concern   Not on file  Social History Narrative   Not on file   Social Determinants of Health   Financial Resource Strain: Not on file  Food Insecurity: No Food Insecurity (06/12/2020)   Hunger Vital Sign    Worried About Running Out of Food in the Last Year: Never true    Ran Out of Food in the Last Year: Never true  Transportation Needs: Unmet Transportation Needs (06/12/2020)   PRAPARE - Administrator, Civil Service (Medical): Yes    Lack of Transportation (Non-Medical): Yes  Physical Activity: Not on file  Stress: Not on file  Social Connections: Not on file   Outpatient Medications Prior to Visit  Medication Sig   albuterol (PROVENTIL HFA) 108 (90 Base) MCG/ACT inhaler Inhale 1-2 puffs into the lungs every 6 (six) hours as needed for wheezing or shortness of breath.   aspirin 81 MG EC tablet Take 1 tablet (81 mg total) by mouth daily.   atorvastatin (LIPITOR) 40 MG tablet Take 1 tablet (40 mg total) by mouth daily.   folic acid (FOLVITE) 1 MG tablet Take 1 mg by mouth daily.   HUMIRA, 2 PEN, 40 MG/0.4ML PNKT Inject 40 mg into the skin once a week.   hydroxychloroquine (PLAQUENIL) 200 MG tablet Take 200 mg by mouth 2 (two) times daily.   iron polysaccharides (NIFEREX) 150 MG capsule Take 150 mg by mouth daily.   leflunomide (ARAVA) 20 MG tablet Take 20 mg by mouth daily.   meloxicam (MOBIC) 15 MG tablet Take 15 mg by mouth daily.   methotrexate (RHEUMATREX) 2.5 MG tablet Take 15 mg by mouth once a week. Take 6  tablets(15 mg total) by mouth every 7 days for 90 days   metoprolol tartrate (LOPRESSOR) 25 MG tablet Take 0.5 tablets (12.5 mg total) by mouth 2 (two) times daily.   rosuvastatin (CRESTOR) 20 MG tablet Take 20 mg by mouth daily.   Vitamin D, Ergocalciferol, (DRISDOL) 1.25 MG (50000 UNIT) CAPS capsule Take 50,000 Units by mouth every 7 (seven) days.   megestrol (MEGACE) 40 MG tablet TAKE 2 TABLETS BY MOUTH DAILY. CAN INCREASE TO 2 TABLETS TWICE DAILY FOR HEAVY BLEEDING  predniSONE (DELTASONE) 5 MG tablet Take 1 tablet (5 mg total) by mouth daily with breakfast. (Patient not taking: Reported on 10/22/2022)   No facility-administered medications prior to visit.   No Known Allergies  Immunization History  Administered Date(s) Administered   Pneumococcal-Unspecified 01/02/2016   Tdap 12/20/2014    Health Maintenance  Topic Date Due   COVID-19 Vaccine (1) Never done   Hepatitis C Screening  Never done   Zoster Vaccines- Shingrix (1 of 2) Never done   INFLUENZA VACCINE  01/02/2023   MAMMOGRAM  09/08/2023   PAP SMEAR-Modifier  09/08/2023   COLONOSCOPY (Pts 45-46yrs Insurance coverage will need to be confirmed)  09/29/2024   DTaP/Tdap/Td (2 - Td or Tdap) 12/19/2024   HIV Screening  Completed   HPV VACCINES  Aged Out    Patient Care Team: [name], MD as PCP - General (Family Medicine)  Review of Systems  HENT:  Positive for dental problem.   Musculoskeletal:  Positive for arthralgias, joint swelling, neck pain and neck stiffness.  Hematological:  Positive for adenopathy.       Objective    BP 121/84 (BP Location: Left Arm, Patient Position: Sitting, Cuff Size: Large)   Pulse 99   Temp 98.2 F (36.8 C) (Oral)   Resp 16   Ht 5\' 3"  (1.6 m)   Wt 253 lb 9.6 oz (115 kg)   LMP 09/02/2022   BMI 44.92 kg/m    Physical Exam Vitals reviewed.  Constitutional:      General: She is not in acute distress.    Appearance: Normal appearance. She is not  ill-appearing, toxic-appearing or diaphoretic.  Eyes:     Conjunctiva/sclera: Conjunctivae normal.  Cardiovascular:     Rate and Rhythm: Normal rate and regular rhythm.     Pulses: Normal pulses.     Heart sounds: Normal heart sounds. No murmur heard.    No friction rub. No gallop.  Pulmonary:     Effort: Pulmonary effort is normal. No respiratory distress.     Breath sounds: Normal breath sounds. No stridor. No wheezing, rhonchi or rales.  Abdominal:     General: Bowel sounds are normal. There is no distension.     Palpations: Abdomen is soft.     Tenderness: There is no abdominal tenderness.  Musculoskeletal:     Right lower leg: No edema.     Left lower leg: No edema.  Skin:    Findings: No erythema or rash.     Comments: Epidermal inclusion cyst on left thoracic region without surrounding erythema   Neurological:     Mental Status: She is alert and oriented to person, place, and time.      Depression Screen    10/22/2022    1:57 PM 05/03/2021   11:21 AM 04/24/2021    4:16 PM 09/07/2020    1:55 PM  PHQ 2/9 Scores  PHQ - 2 Score 2 0 0 0  PHQ- 9 Score 4 0  0   No results found for any visits on 10/22/22.  Assessment & Plan      Problem List Items Addressed This Visit       Cardiovascular and Mediastinum   CAD S/P percutaneous coronary angioplasty    Chronic  Stable  Patient requests to establish with cardiology  Referral for cardiology submitted today  CMP, lipid panel, A1c, CBC collected today        Relevant Medications   rosuvastatin (CRESTOR) 20 MG tablet   Other Relevant  Orders   Ambulatory referral to Cardiology     Musculoskeletal and Integument   Hirsutism    Ordered TSH, LH, FSH as requested by patient        Relevant Orders   TSH + free T4   Epidermal cyst    Exam consistent with epidermal cyst  Referral submitted to dermatology per patient request         Genitourinary   Dysfunctional uterine bleeding    Reports continued vaginal  bleeding  Following with GYN, recommended scheduling follow up  Will collect CBC to check hgb       Relevant Orders   FSH/LH   LH   Testosterone,Free and Total     Other   Anemia, iron deficiency - Primary    Chronic  Low hgb  CBC ordered along with iron, ferritin panel       Relevant Medications   folic acid (FOLVITE) 1 MG tablet   iron polysaccharides (NIFEREX) 150 MG capsule   Hyperlipidemia    Reports previously elevated lipid levels  No current statin therapy Will repeat lipid levels today        Relevant Medications   rosuvastatin (CRESTOR) 20 MG tablet   Other Relevant Orders   Lipid Profile   Obesity    Chronic  BMI 44.92 CMP, lipid panel, hx of pre-DM- A1c ordered today  TSH and free T4 collected  Vitamin B12 and vitamin D collected today        Relevant Orders   TSH + free T4   Prediabetes    Collected A1c today       Relevant Orders   Hemoglobin A1c   COVID-19 vaccine series declined   Establishing care with new doctor, encounter for    Welcomed patient to [facility]  Reviewed patient's medical history, medications, surgical and social history Discussed roles and expectations for primary care physician-patient relationship Recommended patient schedule annual preventative examinations        Epidermal inclusion cyst   Relevant Orders   Ambulatory referral to Dermatology   Healthcare maintenance    Hep C and Hiv screening collected today       Other Visit Diagnoses     Decreased energy       Relevant Orders   CBC   Comprehensive metabolic panel   Hemoglobin A1c   Iron, TIBC and Ferritin Panel   Vitamin B12   Vitamin D (25 hydroxy)   TSH + free T4   Encounter for hepatitis C screening test for low risk patient       Relevant Orders   Hepatitis C Antibody        Return in about 3 months (around 01/22/2023) for CPE.       The entirety of the information documented in the History of Present Illness, Review of  Systems and Physical Exam were personally obtained by me. Portions of this information were initially documented by Hetty Ely, CMA . I, [name], MD have reviewed the documentation above for thoroughness and accuracy.      [name], MD  [facility] [phone] (phone) [phone] (fax)  [facility]

## 2022-10-22 NOTE — Assessment & Plan Note (Signed)
Ordered TSH, LH, FSH as requested by patient

## 2022-10-22 NOTE — Assessment & Plan Note (Signed)
Chronic  Stable  Patient requests to establish with cardiology  Referral for cardiology submitted today  CMP, lipid panel, A1c, CBC collected today

## 2022-10-22 NOTE — Assessment & Plan Note (Signed)
Chronic  Low hgb  CBC ordered along with iron, ferritin panel

## 2022-10-22 NOTE — Assessment & Plan Note (Signed)
Collected A1c today

## 2022-10-22 NOTE — Patient Instructions (Addendum)
It was a pleasure meeting you today!  Welcome to [facility].  I look forward to taking part in your care as your new primary care physician.    Summary of our discussion today:   We will follow up with results of labs once they are available.  A referral has been placed on your behalf for dermatology and cardiology. Our referral coordination team or the office you will be visiting will contact you within the next 2 weeks. If you have not received a phone call within 10 business days please let us know so that we can check into this for you.   Please discuss the Shingles (Shingrix) vaccine with your rheumatology   Please remember to schedule your annual physical one year from your last physical.   You should return to our clinic in 3 months for annual physical.   Best Wishes,   Dr. Roxan Hockey

## 2022-10-23 LAB — LIPID PANEL: Chol/HDL Ratio: 5.8 ratio — ABNORMAL HIGH (ref 0.0–4.4)

## 2022-10-27 LAB — IRON,TIBC AND FERRITIN PANEL
Ferritin: 18 ng/mL (ref 15–150)
Iron Saturation: 10 % — ABNORMAL LOW (ref 15–55)
Iron: 38 ug/dL (ref 27–159)
Total Iron Binding Capacity: 392 ug/dL (ref 250–450)
UIBC: 354 ug/dL (ref 131–425)

## 2022-10-27 LAB — COMPREHENSIVE METABOLIC PANEL
ALT: 10 IU/L (ref 0–32)
AST: 11 IU/L (ref 0–40)
Albumin/Globulin Ratio: 1.1 — ABNORMAL LOW (ref 1.2–2.2)
Albumin: 4 g/dL (ref 3.8–4.9)
Alkaline Phosphatase: 79 IU/L (ref 44–121)
BUN/Creatinine Ratio: 18 (ref 9–23)
BUN: 16 mg/dL (ref 6–24)
Bilirubin Total: 0.3 mg/dL (ref 0.0–1.2)
CO2: 20 mmol/L (ref 20–29)
Calcium: 10 mg/dL (ref 8.7–10.2)
Chloride: 106 mmol/L (ref 96–106)
Creatinine, Ser: 0.89 mg/dL (ref 0.57–1.00)
Globulin, Total: 3.7 g/dL (ref 1.5–4.5)
Glucose: 87 mg/dL (ref 70–99)
Potassium: 4.3 mmol/L (ref 3.5–5.2)
Sodium: 141 mmol/L (ref 134–144)
Total Protein: 7.7 g/dL (ref 6.0–8.5)
eGFR: 78 mL/min/{1.73_m2} (ref >59)

## 2022-10-27 LAB — HEMOGLOBIN A1C
Est. average glucose Bld gHb Est-mCnc: 117 mg/dL
Hgb A1c MFr Bld: 5.7 % — ABNORMAL HIGH (ref 4.8–5.6)

## 2022-10-27 LAB — FSH/LH
FSH: 30.5 m[IU]/mL
LH: 22.6 m[IU]/mL

## 2022-10-27 LAB — CBC
Hematocrit: 38.2 % (ref 34.0–46.6)
Hemoglobin: 12.1 g/dL (ref 11.1–15.9)
MCH: 24 pg — ABNORMAL LOW (ref 26.6–33.0)
MCHC: 31.7 g/dL (ref 31.5–35.7)
MCV: 76 fL — ABNORMAL LOW (ref 79–97)
Platelets: 459 10*3/uL — ABNORMAL HIGH (ref 150–450)
RBC: 5.04 x10E6/uL (ref 3.77–5.28)
RDW: 15 % (ref 11.7–15.4)
WBC: 9.8 10*3/uL (ref 3.4–10.8)

## 2022-10-27 LAB — VITAMIN D 25 HYDROXY (VIT D DEFICIENCY, FRACTURES): Vit D, 25-Hydroxy: 23.3 ng/mL — ABNORMAL LOW (ref 30.0–100.0)

## 2022-10-27 LAB — VITAMIN B12: Vitamin B-12: 617 pg/mL (ref 232–1245)

## 2022-10-27 LAB — TESTOSTERONE,FREE AND TOTAL
Testosterone, Free: 0.6 pg/mL (ref 0.0–4.2)
Testosterone: 5 ng/dL (ref 4–50)

## 2022-10-27 LAB — HEPATITIS C ANTIBODY: Hep C Virus Ab: NONREACTIVE

## 2022-10-27 LAB — LIPID PANEL
Cholesterol, Total: 243 mg/dL — ABNORMAL HIGH (ref 100–199)
HDL: 42 mg/dL (ref >39)
LDL Chol Calc (NIH): 180 mg/dL — ABNORMAL HIGH (ref 0–99)
Triglycerides: 114 mg/dL (ref 0–149)
VLDL Cholesterol Cal: 21 mg/dL (ref 5–40)

## 2022-10-27 LAB — TSH+FREE T4
Free T4: 1.44 ng/dL (ref 0.82–1.77)
TSH: 1.03 u[IU]/mL (ref 0.450–4.500)

## 2022-11-02 DIAGNOSIS — Z419 Encounter for procedure for purposes other than remedying health state, unspecified: Secondary | ICD-10-CM | POA: Diagnosis not present

## 2022-11-04 ENCOUNTER — Encounter (HOSPITAL_BASED_OUTPATIENT_CLINIC_OR_DEPARTMENT_OTHER): Payer: Self-pay | Admitting: Physical Therapy

## 2022-11-04 ENCOUNTER — Ambulatory Visit (HOSPITAL_BASED_OUTPATIENT_CLINIC_OR_DEPARTMENT_OTHER): Payer: Medicaid Other | Attending: Rheumatology | Admitting: Physical Therapy

## 2022-11-04 DIAGNOSIS — R262 Difficulty in walking, not elsewhere classified: Secondary | ICD-10-CM | POA: Insufficient documentation

## 2022-11-04 DIAGNOSIS — Z7689 Persons encountering health services in other specified circumstances: Secondary | ICD-10-CM | POA: Diagnosis not present

## 2022-11-04 DIAGNOSIS — G8929 Other chronic pain: Secondary | ICD-10-CM | POA: Diagnosis not present

## 2022-11-04 DIAGNOSIS — M6281 Muscle weakness (generalized): Secondary | ICD-10-CM | POA: Insufficient documentation

## 2022-11-04 DIAGNOSIS — M25562 Pain in left knee: Secondary | ICD-10-CM | POA: Diagnosis not present

## 2022-11-04 NOTE — Progress Notes (Addendum)
Cardiology Office Note  Date:  11/05/2022   ID:  Abigail Garcia, DOB 1971/04/14, MRN [id number]  PCP:  Abigail Ramp, MD   Chief Complaint  Patient presents with   New Patient (Initial Visit)    Establish care for NSTEMI/ CAD; s/p PTCA; former Abigail Garcia patient and recent 2023 with Dr. Juliann Garcia at [facility] clinic. "Doing well." Medications reviewed by the patient verbally.     HPI:  [name] is a 52 year old woman with past medical history of Coronary artery disease, history of non-STEMI Rheumatoid arthritis Iron deficiency anemia Who presents by referral from Dr. Neita Garcia for coronary artery disease, prior angioplasty  Last seen by [facility] cardiology in 2013 Remote echo on record 2014, normal ejection fraction 55 to 60%  left heart cath 11/2012 and coronary angiogram 10/2012  LHC showed thrombus in the mid LAD  put on heparin, Brilinta, and Integrilin gtts for 2 days and cath was repeated, showing persistence of thrombus. Aspiration thrombectomy and PTCA were then done. The patient was initially placed on Brilinta but developed significant dyspnea so this was stopped and Plavix was begun.   Labs (6/14): ESR 49, lupus anticoagulant negative, anticardiolipin antibody negative, factor V Leiden negative, Protein C and S levels normal.   2020: vaginal bleeding, HGB 7.7, required transfusion, placed on Megace 3/24: more bleeding, Back on megace  Lab work reviewed A1c 5.7 Total cholesterol 243 LDL 180  Nausea, statin myalgia on crestor Taking lipitor inconsistently secondary to statin myalgia  EKG personally reviewed by myself on todays visit NSR rate 85 no ST or T wave changes   PMH:   has a past medical history of Anemia, Blood transfusion without reported diagnosis, CAD (coronary artery disease), Esophagitis, echocardiogram, Menorrhagia, NSTEMI (non-ST elevated myocardial infarction) (HCC), Obesity, and RA (rheumatoid arthritis) (HCC).  PSH:    Past Surgical  History:  Procedure Laterality Date   bilateral tubal     CARDIAC CATHETERIZATION  2014   CESAREAN SECTION     CORONARY ANGIOGRAM  11/19/2012   Procedure: CORONARY ANGIOGRAM;  Surgeon: Abigail M Swaziland, MD;  Location: [facility] CATH LAB;  Service: Cardiovascular;;   DILATION AND CURETTAGE OF UTERUS     DILATION AND CURETTAGE OF UTERUS N/A 03/13/2019   Procedure: DILATATION AND CURETTAGE;  Surgeon: Abigail Bossier, MD;  Location: [facility] OR;  Service: Gynecology;  Laterality: N/A;   INTRAVASCULAR ULTRASOUND  11/19/2012   Procedure: INTRAVASCULAR ULTRASOUND;  Surgeon: Abigail M Swaziland, MD;  Location[facility] CATH LAB;  Service: Cardiovascular;;   LEFT HEART CATHETERIZATION WITH CORONARY ANGIOGRAM N/A 11/16/2012   Procedure: LEFT HEART CATHETERIZATION WITH CORONARY ANGIOGRAM;  Surgeon: Abigail M Swaziland, MD;  Location: [facility] CATH LAB;  Service: Cardiovascular;  Laterality: N/A;   PERCUTANEOUS CORONARY INTERVENTION-BALLOON ONLY  11/19/2012   Procedure: PERCUTANEOUS CORONARY INTERVENTION-BALLOON ONLY;  Surgeon: Abigail M Swaziland, MD;  Location: [facility] CATH LAB;  Service: Cardiovascular;;    Current Outpatient Medications  Medication Sig Dispense Refill   albuterol (PROVENTIL HFA) 108 (90 Base) MCG/ACT inhaler Inhale 1-2 puffs into the lungs every 6 (six) hours as needed for wheezing or shortness of breath. 8 g 0   aspirin 81 MG EC tablet Take 1 tablet (81 mg total) by mouth daily. 30 tablet 0   atorvastatin (LIPITOR) 40 MG tablet Take 1 tablet (40 mg total) by mouth daily. 30 tablet 11   folic acid (FOLVITE) 1 MG tablet Take 1 mg by mouth daily.     HUMIRA, 2 PEN, 40 MG/0.4ML PNKT  Inject 40 mg into the skin once a week.     hydroxychloroquine (PLAQUENIL) 200 MG tablet Take 200 mg by mouth 2 (two) times daily.     megestrol (MEGACE) 40 MG tablet TAKE 2 TABLETS BY MOUTH DAILY. CAN INCREASE TO 2 TABLETS TWICE DAILY FOR HEAVY BLEEDING 60 tablet 5   meloxicam (MOBIC) 15 MG tablet Take 15 mg by mouth daily.     methotrexate (RHEUMATREX)  2.5 MG tablet Take 15 mg by mouth once a week. Take 6 tablets(15 mg total) by mouth every 7 days for 90 days     metoprolol tartrate (LOPRESSOR) 25 MG tablet Take 0.5 tablets (12.5 mg total) by mouth 2 (two) times daily. 180 tablet 1   predniSONE (DELTASONE) 5 MG tablet Take 1 tablet (5 mg total) by mouth daily with breakfast. (Patient taking differently: Take 5 mg by mouth daily as needed.) 30 tablet 0   No current facility-administered medications for this visit.     Allergies:   Patient has no known allergies.   Social History:  The patient  reports that she has never smoked. She has never used smokeless tobacco. She reports that she does not drink alcohol and does not use drugs.   Family History:   family history includes Breast cancer in her paternal grandmother; Heart disease in an other family member.    Review of Systems: Review of Systems  Constitutional: Negative.   HENT: Negative.    Respiratory: Negative.    Cardiovascular: Negative.   Gastrointestinal: Negative.   Musculoskeletal: Negative.   Neurological: Negative.   Psychiatric/Behavioral: Negative.    All other systems reviewed and are negative.    PHYSICAL EXAM: VS:  BP 110/70 (BP Location: Right Arm, Patient Position: Sitting, Cuff Size: Large)   Pulse 85   Ht 5\' 3"  (1.6 m)   Wt 253 lb 4 oz (114.9 kg)   LMP 09/02/2022   SpO2 97%   BMI 44.86 kg/m  , BMI Body mass index is 44.86 kg/m. GEN: Well nourished, well developed, in no acute distress HEENT: normal Neck: no JVD, carotid bruits, or masses Cardiac: RRR; no murmurs, rubs, or gallops,no edema  Respiratory:  clear to auscultation bilaterally, normal work of breathing GI: soft, nontender, nondistended, + BS MS: no deformity or atrophy Skin: warm and dry, no rash Neuro:  Strength and sensation are intact Psych: euthymic mood, full affect   Recent Labs: 10/22/2022: ALT 10; BUN 16; Creatinine, Ser 0.89; Hemoglobin 12.1; Platelets 459; Potassium 4.3;  Sodium 141; TSH 1.030    Lipid Panel Lab Results  Component Value Date   CHOL 243 (H) 10/22/2022   HDL 42 10/22/2022   LDLCALC 180 (H) 10/22/2022   TRIG 114 10/22/2022      Wt Readings from Last 3 Encounters:  11/05/22 253 lb 4 oz (114.9 kg)  10/22/22 253 lb 9.6 oz (115 kg)  07/31/21 260 lb 6.4 oz (118.1 kg)      ASSESSMENT AND PLAN:  Problem List Items Addressed This Visit       Cardiology Problems   CAD S/P percutaneous coronary angioplasty - Primary   Hyperlipidemia     Other   Prediabetes   Other Visit Diagnoses     Rheumatoid arthritis, involving unspecified site, unspecified whether rheumatoid factor present (HCC)          Coronary disease with stable angina Prior thrombus to LAD, aspiration 2014 No significant events since that time On aspirin Does not like taking Lipitor on a  regular basis, statin myalgia on both Lipitor and Crestor Metoprolol more as needed for palpitations She is requesting CT coronary calcium scoring for risk stratification, assistance with management of her hyperlipidemia Start Burna, will likely need PCSK9 inhibitor as detailed below  Prediabetes We have encouraged continued exercise, careful diet management in an effort to lose weight.  Hyperlipidemia History of statin myalgias, Crestor Start Zetia Given coronary disease, will place a prescription for Repatha 140 every 2 weeks She would prefer injection over pill if possible  Obesity We have encouraged continued exercise, careful diet management in an effort to lose weight.  Rheumatoid arthritis Followed by rheumatology Previously treated with methotrexate, Plaquenil, Humira   Total encounter time more than 60 minutes  Greater than 50% was spent in counseling and coordination of care with the patient    Signed, Dossie Arbour, M.D., Ph.D. [facility] [place], [place] [phone]

## 2022-11-04 NOTE — Therapy (Signed)
OUTPATIENT PHYSICAL THERAPY TREATMENT NOTE / PROGRESS NOTE Dates of reporting from 09/04/2022 to 10/17/2022   Patient Name: Abigail Garcia MRN: [id number] DOB:1970/08/20, 52 y.o., female Today's Date: 11/04/2022  PCP: no PCP  REFERRING PROVIDER: Defoor, Dionne Ano, PA-C (Rheumatology)   END OF SESSION:   PT End of Session - 11/04/22 1505     Visit Number 10    Number of Visits 12    Date for PT Re-Evaluation 11/27/22    Authorization Type [place] MEDICA[employer] reporting period from 09/04/2022    Authorization Time Period [facility] auth#[id number] 4/15-7/30 for 12 PT visits    Authorization - Number of Visits 12    Progress Note Due on Visit 10    PT Start Time 1504    PT Stop Time 1545    PT Time Calculation (min) 41 min    Activity Tolerance Patient tolerated treatment well    Behavior During Therapy [facility] for tasks assessed/performed             Past Medical History:  Diagnosis Date   Anemia    Blood transfusion without reported diagnosis    CAD (coronary artery disease)    a. 11/2012 NSTEMI/Cath/PCI: LM nl, LAD 80-90 thrombotic (tx with Heparin x 2 days then aspiration thrombectomy and PTCA), RI nl, LCX sm/nl, RCA nl, EF 55-65%.   Esophagitis    grade 1   Hx of echocardiogram    a. Echo (6/14) with EF 55-60%.    Menorrhagia    NSTEMI (non-ST elevated myocardial infarction) (HCC)    11/2012    Obesity    RA (rheumatoid arthritis) (HCC)    Past Surgical History:  Procedure Laterality Date   bilateral tubal     CARDIAC CATHETERIZATION  2014   CESAREAN SECTION     CORONARY ANGIOGRAM  11/19/2012   Procedure: CORONARY ANGIOGRAM;  Surgeon: Peter M Swaziland, MD;  Location: [facility] CATH LAB;  Service: Cardiovascular;;   DILATION AND CURETTAGE OF UTERUS     DILATION AND CURETTAGE OF UTERUS N/A 03/13/2019   Procedure: DILATATION AND CURETTAGE;  Surgeon: Allie Bossier, MD;  Location: [facility] OR;  Service: Gynecology;  Laterality: N/A;   INTRAVASCULAR ULTRASOUND  11/19/2012   Procedure:  INTRAVASCULAR ULTRASOUND;  Surgeon: Peter M Swaziland, MD;  Location: [facility] CATH LAB;  Service: Cardiovascular;;   LEFT HEART CATHETERIZATION WITH CORONARY ANGIOGRAM N/A 11/16/2012   Procedure: LEFT HEART CATHETERIZATION WITH CORONARY ANGIOGRAM;  Surgeon: Peter M Swaziland, MD;  Location: [facility] CATH LAB;  Service: Cardiovascular;  Laterality: N/A;   PERCUTANEOUS CORONARY INTERVENTION-BALLOON ONLY  11/19/2012   Procedure: PERCUTANEOUS CORONARY INTERVENTION-BALLOON ONLY;  Surgeon: Peter M Swaziland, MD;  Location: [facility] CATH LAB;  Service: Cardiovascular;;   Patient Active Problem List   Diagnosis Date Noted   COVID-19 vaccine series declined 10/22/2022   Establishing care with new doctor, encounter for 10/22/2022   Dysfunctional uterine bleeding 10/22/2022   Epidermal inclusion cyst 10/22/2022   Healthcare maintenance 10/22/2022   Epidermal cyst 04/24/2021   Specimen satisfactory for evaluation but limited by absence of endocervical or transformation zone component from patient with cervix 09/30/2020   Symptomatic anemia 03/12/2019   Hirsutism 04/17/2017   Prediabetes 08/18/2015   Blurry vision, bilateral 08/18/2015   Abnormal uterine bleeding (AUB) 08/11/2015   Obesity 11/11/2013   Thrombocytosis 08/31/2013   Ganglion cyst of wrist 06/12/2013   Hyperlipidemia 12/03/2012   CAD S/P percutaneous coronary angioplasty 11/30/2012   Rheumatoid arthritis flare (HCC) 11/14/2012   Anemia, iron deficiency 11/14/2012  REFERRING DIAG: rheumatoid arthritis involving multiple sites with positive rheumatoid factor   THERAPY DIAG:  Difficulty in walking, not elsewhere classified  Muscle weakness (generalized)  Chronic pain of left knee  Rationale for Evaluation and Treatment: Rehabilitation  PERTINENT HISTORY: Patient is a 52 y.o. female who presents to outpatient physical therapy with a referral for medical diagnosis rheumatoid arthritis involving multiple sites with positive rheumatoid factor. This patient's  chief complaints consist of pain in her right ankle/foot, left knee, left elbow, left neck and jaw as well as generalized discomfort and difficulty with mobility and balance leading to the following functional deficits: difficulty moving around, doing her daily tasks such as working, shopping, going up and down stairs, using her hands, dancing, spending time with grandchildren, getting up and down from the floor, keeping her balance. 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  Patient denies hx of cancer, stroke, seizures, lung problems, diabetes, unexplained weight loss, unexplained changes in bowel or bladder problems, osteoporosis, and spinal surgery   PRECAUTIONS:  active severe Stage 3 rheumatoid arthritis with positive rheumatoid factor, fall risk.   SUBJECTIVE:                                                                                                                                                                                      SUBJECTIVE STATEMENT:  Pt reports she has been to the [facility] pool in [place] and has completed her aquatic HEP. She does report that there is not any floatation equipment she is able to use there.   PAIN:  Are you in pain? Yes NPRS:  3/10  Location: L elbow. (And left knee     OBJECTIVE  SELF-REPORTED FUNCTION FOTO score: 61/100 (NOC-Neuromuscular disorder questionnaire)  FUNCTIONAL/BALANCE TESTS:  Ten meter walking trial ( ): self selected pace: 0.92  m/s; fast pace: 0.1 m/s. No AD.  Floor Transfer Test: 16 seconds with use of chair.  Stairs: ascends four 6-inch steps with B UE using step over step gait pattern, mild compensations to avoid loading knee. Descends four 6 inch steps with B UE and step over step with difficulty controlling eccentric phase.   TODAY'S TREATMENT:     Pt  seen for aquatic therapy today.  Treatment took place in water 3.5-4.75 ft in depth at the [facility] pool. Temp of water was 91.  Pt entered/exited the pool via stairs independently in step-to pattern with bilat rail.  *unsupported walking forward, back and side stepping *Standing unsupported (Strengthening and SLS balance challenges): hip abd; hip flex; hip ext; df;  pf;  * holding wall standing:  hip flex/ext; relaxed squats  *Seated on bench: cycling; hip add/abd; LAQ; STS x10 *walking forward and back with reciprocal arm swing,  side stepping with arm addct / abdct; walking with alternating row arms  *wall push ups x10. Modified to tolerate (decreased right elbow and hand ROM) *standing unsupported: hamstring curls    Pt requires the buoyancy and hydrostatic pressure of water for support, and to offload joints by unweighting joint load by at least 50 % in navel deep water and by at least 75-80% in chest to neck deep water.  Viscosity of the water is needed for resistance of strengthening. Water current perturbations provides challenge to standing balance requiring increased core activation.  Therapeutic exercise: to centralize symptoms and improve ROM, strength, muscular endurance, and activity tolerance required for successful completion of functional activities.  - NuStep level 1 using bilateral upper and lower extremities. Seat/handle setting 9/9. For improved extremity mobility, muscular endurance, and activity tolerance; and to induce the analgesic effect of aerobic exercise, stimulate improved joint nutrition, and prepare body structures and systems for following interventions. x 5 minutes. Average SPM = 65. - hooklying low trunk rotation, x5 min  Manual therapy: to reduce pain and tissue tension, improve range of motion, neuromodulation, in order to promote improved ability to complete functional activities. HOOKLYING - L elbow distraction in 90 degrees flexion, 3x30 seconds  grade III-IV - L elbow PROM flexion, forearm pronation/supination,  - gentle PA and AP glide of radial head in pronation and supination respectively, grade II-III, 1x30.   Pt required multimodal cuing for proper technique and to facilitate improved neuromuscular control, strength, range of motion, and functional ability resulting in improved performance and form.  PATIENT EDUCATION:  Education details: Exercise purpose/form. Person educated: Patient Education method: Explanation Education comprehension: verbalized understanding and needs further education   HOME EXERCISE PROGRAM: Access Code: [email or web address] URL: [email or web address] Date: 09/30/2022 Prepared by: Norton Blizzard  Exercises - Seated Upper Trapezius Stretch  - 2 x daily - 2-3 reps - 30 seconds hold - Seated Cervical Rotation AROM  - 2 x daily - 2 sets - 10 reps - Seated Scapular Retraction  - 1-2 x daily - 2 sets - 10 reps - 5 seconds hold - Prone Cervical Retraction  - 1 x daily - 3 sets - 10 reps - 1-5 seconds hold - Standing Toe Taps  - 3-5 x weekly - 3 sets - 15 reps - Walking Tandem Stance  - 3-5 x weekly - 1-2 sets - 10 reps   ASSESSMENT:   CLINICAL IMPRESSION: Pt presents for last aquatic session.  She has already completed aquatic HEP at home pool.  She reports no questions or concerns.  She is directed through session today without using any floatation/foam devices as she does not have access to them at her pool to demonstrate completion options.  She is challenged on higher degree with balance/core strength but executes well.  No reports onf increased discomfort.  Added elbow ROM and gentle strengthening to be added to her aquatic program.  She has reached her max potential in setting.   PN: Patient has attended 9 physical therapy sessions since starting current episode of care on 09/04/2022. She has participated in land and aquatic therapy sessions and reports significant improvement in quality of life  that is reflected in her FOTO score of 61 which exceeded her goal of 50 by visit #20. She also improved her floor transfer test time from  23 seconds to 16 seconds. She is still working on getting and implementing her long term aquatic HEP and has not made significant progress towards walking speed or highest pain level with functional activities. She did have an RA flair last week that affected her pain goal. Patient would benefit from completing 3 more visits to make sure she is able to perform her aquatic HEP effectively and safely before discharge .Patient would benefit from continued management of limiting condition by skilled physical therapist to address remaining impairments and functional limitations to work towards stated goals and return to PLOF or maximal functional independence.   From initial PT evaluation from 09/04/2022:  Patient is a 52 y.o. female referred to outpatient physical therapy with a medical diagnosis of rheumatoid arthritis involving multiple sites with positive rheumatoid factor who presents with signs and symptoms consistent with multi-joint pain and mobility/balance deficits related to rheumatoid arthritis. Patient would benefit from more specific examination of neck pain and balance at future sessions. Patient presents with significant pain, ROM, joint stiffness, posture, muscle performance (strength/power/endurance), gait, balance, muscle tension, and activity tolerance impairments that are limiting ability to complete activities such as moving around, doing her daily tasks such as working, shopping, going up and down stairs, using her hands, dancing, spending time with grandchildren, getting up and down from the floor, keeping her balance without difficulty. She would benefit from the buoyancy and viscosity of water and is a good candidate for aquatic therapy. Patient will benefit from skilled physical therapy intervention to address current body structure impairments and activity  limitations to improve function and work towards goals set in current POC in order to return to prior level of function or maximal functional improvement.      OBJECTIVE IMPAIRMENTS: Abnormal gait, decreased activity tolerance, decreased balance, decreased endurance, decreased knowledge of condition, decreased knowledge of use of DME, decreased mobility, difficulty walking, decreased ROM, decreased strength, hypomobility, increased edema, impaired perceived functional ability, increased muscle spasms, impaired flexibility, impaired UE functional use, improper body mechanics, postural dysfunction, obesity, and pain.    ACTIVITY LIMITATIONS: carrying, lifting, bending, standing, squatting, stairs, transfers, bed mobility, dressing, reach over head, hygiene/grooming, and locomotion level   PARTICIPATION LIMITATIONS: interpersonal relationship, community activity, occupation, and   difficulty moving around, doing her daily tasks such as working, shopping, going up and down stairs, using her hands, dancing, spending time with grandchildren, getting up and down from the floor, keeping her balance   PERSONAL FACTORS: Fitness, Past/current experiences, Profession, Time since onset of injury/illness/exacerbation, Transportation, and 3+ comorbidities:   anemia, CAD, esophagitis, menorrhagia, NSTEMI (and cardiac cath 2014), active severe Stage 3 rheumatoid arthritis with positive rheumatoid factor are also affecting patient's functional outcome.    REHAB POTENTIAL: Good   CLINICAL DECISION MAKING: Evolving/moderate complexity   EVALUATION COMPLEXITY: Moderate     GOALS: Goals reviewed with patient? No   SHORT TERM GOALS: Target date: 09/18/2022   Patient will be independent with initial home exercise program for self-management of symptoms. Baseline: Initial HEP to be provided at visit 2 as appropriate (09/04/22); Goal status: In-progress     LONG TERM GOALS: Target date: 11/27/2022   Patient will  be independent with a long-term home exercise program for self-management of symptoms.  Baseline: Initial HEP to be provided at visit 2 as appropriate (09/04/22); participating intermittently with room for improvement, is waiting for aquatic HEP in the mail to start going to a local pool (10/17/2022);   Goal status: In-progress   2.  Patient will demonstrate improved FOTO to equal or greater than 50 by visit #12 to demonstrate improvement in overall condition and self-reported functional ability.  Baseline: 40 (09/04/22); 61 at visit # 9 (10/17/2022);  Goal status: MET   3.  Patient will complete floor transfer test in equal or less than 8.8 seconds to demonstrate improved ability to get up and down from the floor so she can play with her grandchildren.  Baseline: 24 seconds (09/04/22); 16 seconds (10/17/2022);  Goal status: In-progress   4.  Patient will ambulate equal or greater than 1.2 m/s in the 10 Meter Walking Trial to improve her safety when walking across an intersection. Baseline: 0.99 m/s (09/04/22); 1 m/s (10/17/2022);  Goal status: In-progress   5.  Patient will report decreased pain to equal or less than 3/10 during functional activities to improve her ability to work, and do leisure activities.  Baseline: up to 8/10 (09/04/22); up to 9/10 in the last two weeks during a recent RA flair (10/17/2022);  Goal status: In-progress   6.  Patient will ascend and descend four 6 inch steps using step over step technique with no evidence of lack of control and no use of B UE to improve her ability to use stairs at her home while carrying items and with decreased fall risk.  Baseline: needs B UE support and lacks eccentric control (09/04/2022); continues to need same support, but more confident with increased speed (10/17/2022);  Goal status: In-progress     PLAN:   PT FREQUENCY: 1-2x/week   PT DURATION: 12 weeks   PLANNED INTERVENTIONS: Therapeutic exercises, Therapeutic activity,  Neuromuscular re-education, Balance training, Gait training, Patient/Family education, Self Care, Joint mobilization, Stair training, DME instructions, Aquatic Therapy, Dry Needling, Electrical stimulation, Spinal mobilization, Cryotherapy, Moist heat, Manual therapy, and Re-evaluation.   PLAN FOR NEXT SESSION: aquatic therapy, update HEP as appropriate, progressive posture/LE/UE/core/functional strength and ROM exercises and balance exercises as appropriate. Education.  Corrie Dandy Tomma Lightning) Domenico Achord MPT 11/04/22, 3:06 PM  [facility] [address] [place], [place] [address] P: [phone] I F: [phone]

## 2022-11-05 ENCOUNTER — Telehealth: Payer: Self-pay

## 2022-11-05 ENCOUNTER — Ambulatory Visit: Payer: Medicaid Other | Attending: Cardiovascular Disease | Admitting: Cardiovascular Disease

## 2022-11-05 ENCOUNTER — Encounter: Payer: Self-pay | Admitting: Cardiovascular Disease

## 2022-11-05 VITALS — BP 110/70 | HR 85 | Ht 63.0 in | Wt 253.2 lb

## 2022-11-05 DIAGNOSIS — T466X5A Adverse effect of antihyperlipidemic and antiarteriosclerotic drugs, initial encounter: Secondary | ICD-10-CM

## 2022-11-05 DIAGNOSIS — I2510 Atherosclerotic heart disease of native coronary artery without angina pectoris: Secondary | ICD-10-CM | POA: Diagnosis not present

## 2022-11-05 DIAGNOSIS — Z79899 Other long term (current) drug therapy: Secondary | ICD-10-CM

## 2022-11-05 DIAGNOSIS — Z7689 Persons encountering health services in other specified circumstances: Secondary | ICD-10-CM | POA: Diagnosis not present

## 2022-11-05 DIAGNOSIS — R7303 Prediabetes: Secondary | ICD-10-CM | POA: Diagnosis not present

## 2022-11-05 DIAGNOSIS — M069 Rheumatoid arthritis, unspecified: Secondary | ICD-10-CM | POA: Diagnosis not present

## 2022-11-05 DIAGNOSIS — E782 Mixed hyperlipidemia: Secondary | ICD-10-CM | POA: Diagnosis not present

## 2022-11-05 DIAGNOSIS — Z9861 Coronary angioplasty status: Secondary | ICD-10-CM

## 2022-11-05 DIAGNOSIS — M791 Myalgia, unspecified site: Secondary | ICD-10-CM

## 2022-11-05 MED ORDER — REPATHA SURECLICK 140 MG/ML ~~LOC~~ SOAJ: 140.0000 mg | SUBCUTANEOUS | 3 refills | Status: DC

## 2022-11-05 NOTE — Telephone Encounter (Signed)
*  [facility]  PA request received via CMM for Repatha SureClick 140MG /ML auto-injectors  PA submitted to [facility] Medicaid of [place] and is pending additional questions/determination  Key: [id number]

## 2022-11-05 NOTE — Patient Instructions (Addendum)
Medication Instructions:  Inject Repatha 140 mg into the skin every 2 weeks   If you need a refill on your cardiac medications before your next appointment, please call your pharmacy.   Lab work: No new labs needed  Testing/Procedures: CT coronary calcium score.   - $99 out of pocket cost at the time of your test - Call [phone] to schedule at your convenience.  [facility] [address] [place], [place] [address]   Coronary CalciumScan A coronary calcium scan is an imaging test used to look for deposits of calcium and other fatty materials (plaques) in the inner lining of the blood vessels of the heart (coronary arteries). These deposits of calcium and plaques can partly clog and narrow the coronary arteries without producing any symptoms or warning signs. This puts a person at risk for a heart attack. This test can detect these deposits before symptoms develop. Tell a health care provider about: Any allergies you have. All medicines you are taking, including vitamins, herbs, eye drops, creams, and over-the-counter medicines. Any problems you or family members have had with anesthetic medicines. Any blood disorders you have. Any surgeries you have had. Any medical conditions you have. Whether you are pregnant or may be pregnant. What are the risks? Generally, this is a safe procedure. However, problems may occur, including: Harm to a pregnant woman and her unborn baby. This test involves the use of radiation. Radiation exposure can be dangerous to a pregnant woman and her unborn baby. If you are pregnant, you generally should not have this procedure done. Slight increase in the risk of cancer. This is because of the radiation involved in the test. What happens before the procedure? No preparation is needed for this procedure. What happens during the procedure? You will undress and remove any jewelry around your neck or chest. You  will put on a hospital gown. Sticky electrodes will be placed on your chest. The electrodes will be connected to an electrocardiogram (ECG) machine to record a tracing of the electrical activity of your heart. A CT scanner will take pictures of your heart. During this time, you will be asked to lie still and hold your breath for 2-3 seconds while a picture of your heart is being taken. The procedure may vary among health care providers and hospitals. What happens after the procedure? You can get dressed. You can return to your normal activities. It is up to you to get the results of your test. Ask your health care provider, or the department that is doing the test, when your results will be ready. Summary A coronary calcium scan is an imaging test used to look for deposits of calcium and other fatty materials (plaques) in the inner lining of the blood vessels of the heart (coronary arteries). Generally, this is a safe procedure. Tell your health care provider if you are pregnant or may be pregnant. No preparation is needed for this procedure. A CT scanner will take pictures of your heart. You can return to your normal activities after the scan is done. This information is not intended to replace advice given to you by your health care provider. Make sure you discuss any questions you have with your health care provider. Document Released: 11/16/2007 Document Revised: 04/08/2016 Document Reviewed: 04/08/2016 Elsevier Interactive Patient Education  2017 [employer].   Follow-Up: At [facility], you and your health needs are our priority.  As part of our continuing mission to provide you  with exceptional heart care, we have created designated Provider Care Teams.  These Care Teams include your primary Cardiologist (physician) and Advanced Practice Providers (APPs -  Physician Assistants and Nurse Practitioners) who all work together to provide you with the care you need, when you need it.  You  will need a follow up appointment in 12 months  Providers on your designated Care Team:   Nicolasa Ducking, NP Eula Listen, PA-C Cadence Fransico Michael, [place]  COVID-19 Vaccine Information can be found at: [email or web address] For questions related to vaccine distribution or appointments, please email [email or web address] or call [phone].

## 2022-11-06 ENCOUNTER — Encounter: Payer: Self-pay | Admitting: Physical Therapy

## 2022-11-06 ENCOUNTER — Ambulatory Visit: Payer: Medicaid Other | Attending: Rheumatology | Admitting: Physical Therapy

## 2022-11-06 DIAGNOSIS — M25522 Pain in left elbow: Secondary | ICD-10-CM | POA: Diagnosis not present

## 2022-11-06 DIAGNOSIS — M6281 Muscle weakness (generalized): Secondary | ICD-10-CM | POA: Insufficient documentation

## 2022-11-06 DIAGNOSIS — M542 Cervicalgia: Secondary | ICD-10-CM | POA: Insufficient documentation

## 2022-11-06 DIAGNOSIS — R262 Difficulty in walking, not elsewhere classified: Secondary | ICD-10-CM | POA: Insufficient documentation

## 2022-11-06 DIAGNOSIS — M25571 Pain in right ankle and joints of right foot: Secondary | ICD-10-CM | POA: Diagnosis not present

## 2022-11-06 DIAGNOSIS — G8929 Other chronic pain: Secondary | ICD-10-CM | POA: Insufficient documentation

## 2022-11-06 DIAGNOSIS — Z7689 Persons encountering health services in other specified circumstances: Secondary | ICD-10-CM | POA: Diagnosis not present

## 2022-11-06 DIAGNOSIS — M25562 Pain in left knee: Secondary | ICD-10-CM | POA: Insufficient documentation

## 2022-11-06 NOTE — Therapy (Signed)
OUTPATIENT PHYSICAL THERAPY TREATMENT NOTE / DISCHARGE SUMMARY Dates of reporting from 09/04/2022 to 11/06/2022   Patient Name: Abigail Garcia MRN: [id number] DOB:[birth year], 52 y.o., female Today's Date: 11/06/2022  PCP: no PCP  REFERRING PROVIDER: Defoor, Dionne Ano, PA-C (Rheumatology)   END OF SESSION:   PT End of Session - 11/06/22 0908     Visit Number 11    Number of Visits 12    Date for PT Re-Evaluation 11/27/22    Authorization Type [place] MEDICA[employer] reporting period from 09/04/2022    Authorization Time Period [facility] auth#[id number] 4/15-7/30 for 12 PT visits    Authorization - Visit Number 10    Authorization - Number of Visits 12    Progress Note Due on Visit 10    PT Start Time 0905    PT Stop Time 0945    PT Time Calculation (min) 40 min    Activity Tolerance Patient tolerated treatment well    Behavior During Therapy [facility] for tasks assessed/performed              Past Medical History:  Diagnosis Date   Anemia    Blood transfusion without reported diagnosis    CAD (coronary artery disease)    a. 11/2012 NSTEMI/Cath/PCI: LM nl, LAD 80-90 thrombotic (tx with Heparin x 2 days then aspiration thrombectomy and PTCA), RI nl, LCX sm/nl, RCA nl, EF 55-65%.   Esophagitis    grade 1   Hx of echocardiogram    a. Echo (6/14) with EF 55-60%.    Menorrhagia    NSTEMI (non-ST elevated myocardial infarction) (HCC)    11/2012    Obesity    RA (rheumatoid arthritis) (HCC)    Past Surgical History:  Procedure Laterality Date   bilateral tubal     CARDIAC CATHETERIZATION  2014   CESAREAN SECTION     CORONARY ANGIOGRAM  11/19/2012   Procedure: CORONARY ANGIOGRAM;  Surgeon: Peter M Swaziland, MD;  Location: [facility] CATH LAB;  Service: Cardiovascular;;   DILATION AND CURETTAGE OF UTERUS     DILATION AND CURETTAGE OF UTERUS N/A 03/13/2019   Procedure: DILATATION AND CURETTAGE;  Surgeon: Allie Bossier, MD;  Location: [facility] OR;  Service: Gynecology;  Laterality: N/A;   INTRAVASCULAR  ULTRASOUND  11/19/2012   Procedure: INTRAVASCULAR ULTRASOUND;  Surgeon: Peter M Swaziland, MD;  Location: [facility] CATH LAB;  Service: Cardiovascular;;   LEFT HEART CATHETERIZATION WITH CORONARY ANGIOGRAM N/A 11/16/2012   Procedure: LEFT HEART CATHETERIZATION WITH CORONARY ANGIOGRAM;  Surgeon: Peter M Swaziland, MD;  Location: [facility] CATH LAB;  Service: Cardiovascular;  Laterality: N/A;   PERCUTANEOUS CORONARY INTERVENTION-BALLOON ONLY  11/19/2012   Procedure: PERCUTANEOUS CORONARY INTERVENTION-BALLOON ONLY;  Surgeon: Peter M Swaziland, MD;  Location: [facility] CATH LAB;  Service: Cardiovascular;;   Patient Active Problem List   Diagnosis Date Noted   COVID-19 vaccine series declined 10/22/2022   Establishing care with new doctor, encounter for 10/22/2022   Dysfunctional uterine bleeding 10/22/2022   Epidermal inclusion cyst 10/22/2022   Healthcare maintenance 10/22/2022   Epidermal cyst 04/24/2021   Specimen satisfactory for evaluation but limited by absence of endocervical or transformation zone component from patient with cervix 09/30/2020   Symptomatic anemia 03/12/2019   Hirsutism 04/17/2017   Prediabetes 08/18/2015   Blurry vision, bilateral 08/18/2015   Abnormal uterine bleeding (AUB) 08/11/2015   Obesity 11/11/2013   Thrombocytosis 08/31/2013   Ganglion cyst of wrist 06/12/2013   Hyperlipidemia 12/03/2012   CAD S/P percutaneous coronary angioplasty 11/30/2012   Rheumatoid arthritis  flare (HCC) 11/14/2012   Anemia, iron deficiency 11/14/2012    REFERRING DIAG: rheumatoid arthritis involving multiple sites with positive rheumatoid factor   THERAPY DIAG:  Difficulty in walking, not elsewhere classified  Muscle weakness (generalized)  Chronic pain of left knee  Pain in left elbow  Pain in right ankle and joints of right foot  Cervicalgia  Rationale for Evaluation and Treatment: Rehabilitation  PERTINENT HISTORY: Patient is a 52 y.o. female who presents to outpatient physical therapy with a  referral for medical diagnosis rheumatoid arthritis involving multiple sites with positive rheumatoid factor. This patient's chief complaints consist of pain in her right ankle/foot, left knee, left elbow, left neck and jaw as well as generalized discomfort and difficulty with mobility and balance leading to the following functional deficits: difficulty moving around, doing her daily tasks such as working, shopping, going up and down stairs, using her hands, dancing, spending time with grandchildren, getting up and down from the floor, keeping her balance. 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  Patient denies hx of cancer, stroke, seizures, lung problems, diabetes, unexplained weight loss, unexplained changes in bowel or bladder problems, osteoporosis, and spinal surgery   PRECAUTIONS:  active severe Stage 3 rheumatoid arthritis with positive rheumatoid factor, fall risk.   SUBJECTIVE:                                                                                                                                                                                      SUBJECTIVE STATEMENT:  Patient reports she has been doing well. She went to her last aquatic PT session on Monday and got her long term HEP. She has been to [facility] but did not like that it was outside, so [facility]. Her pain is low today. She would like a copy of the handouts for a long term HEP for land today. She feels ready to discharge from PT.    PAIN:  Are you in pain? Yes NPRS:  3/10 Location: L elbow, L knee    OBJECTIVE  SELF-REPORTED FUNCTION FOTO score: 66/100 (NOC-Neuromuscular disorder questionnaire)  TODAY'S TREATMENT:    Therapeutic exercise: to centralize symptoms and improve ROM, strength, muscular endurance, and  activity tolerance required for successful completion of functional activities.  - NuStep level 1 using bilateral upper and lower extremities. Seat/handle setting 9/9. For improved extremity mobility, muscular endurance, and activity tolerance; and to induce the analgesic effect of aerobic exercise, stimulate improved joint nutrition, and prepare body structures and systems for  following interventions. x 5 minutes. Average SPM = 65. - review and update of HEP - sit <> stand with no UE support, 1x10.   Manual therapy: to reduce pain and tissue tension, improve range of motion, neuromodulation, in order to promote improved ability to complete functional activities. HOOKLYING - L elbow distraction in 90 degrees flexion, 3x30 seconds grade III-IV - L elbow PROM flexion, forearm pronation/supination,  - gentle AP glide of radial head in pronation and neutral respectively, grade II-III, 2x30 seconds    Pt required multimodal cuing for proper technique and to facilitate improved neuromuscular control, strength, range of motion, and functional ability resulting in improved performance and form.  PATIENT EDUCATION:  Education details: Exercise purpose/form. Person educated: Patient Education method: Explanation Education comprehension: verbalized understanding and needs further education   HOME EXERCISE PROGRAM: Access Code: [email or web address] URL: [email or web address] Date: 11/06/2022 Prepared by: Norton Blizzard  Exercises - Seated Upper Trapezius Stretch  - 3-5 x weekly - 2-3 reps - 30 seconds hold - Seated Cervical Rotation AROM  - 3-5 x weekly - 2 sets - 10 reps - Seated Elbow Flexion Extension AROM  - 3-5 x weekly - 1-2 sets - 10 reps - Seated Forearm Pronation and Supination AROM  - 3-5 x weekly - 1-2 sets - 10 reps - Standing Toe Taps  - 3-5 x weekly - 3 sets - 15 reps - Walking Tandem Stance  - 3-5 x weekly - 1-2 sets - 10 reps - Sit to Stand Without Arm Support  - 3-5 x weekly - 1-3  sets - 5-10 reps - Prone Cervical Retraction  - 3-5 x weekly - 2 sets - 10 reps - 5 seconds hold - Prone Scapular Retraction  - 3-5 x weekly - 2 sets - 10 reps - 5 hold   ASSESSMENT:   CLINICAL IMPRESSION: Patient has attended 11 physical therapy sessions since starting current episode of care on  09/04/2022. She has exceeded her FOTO goal and met her goal of becoming independent in long term HEP for aquatic and land environments. She has made progress towards her other goals except she continues to have pain more consistent with RA flair pattern. Patient has completed her POC and is ready for discharge to long term HEP. She is now discharged from PT.    OBJECTIVE IMPAIRMENTS: Abnormal gait, decreased activity tolerance, decreased balance, decreased endurance, decreased knowledge of condition, decreased knowledge of use of DME, decreased mobility, difficulty walking, decreased ROM, decreased strength, hypomobility, increased edema, impaired perceived functional ability, increased muscle spasms, impaired flexibility, impaired UE functional use, improper body mechanics, postural dysfunction, obesity, and pain.    ACTIVITY LIMITATIONS: carrying, lifting, bending, standing, squatting, stairs, transfers, bed mobility, dressing, reach over head, hygiene/grooming, and locomotion level   PARTICIPATION LIMITATIONS: interpersonal relationship, community activity, occupation, and   difficulty moving around, doing her daily tasks such as working, shopping, going up and down stairs, using her hands, dancing, spending time with grandchildren, getting up and down from the floor, keeping her balance   PERSONAL FACTORS: Fitness, Past/current experiences, Profession, Time since onset of injury/illness/exacerbation, Transportation, and 3+ comorbidities:   anemia, CAD, esophagitis, menorrhagia, NSTEMI (and cardiac cath 2014), active severe Stage 3 rheumatoid arthritis with positive rheumatoid factor are also affecting  patient's functional outcome.    REHAB POTENTIAL: Good   CLINICAL DECISION MAKING: Evolving/moderate complexity   EVALUATION COMPLEXITY: Moderate     GOALS: Goals reviewed with patient? No   SHORT TERM GOALS: Target date: 09/18/2022  Patient will be independent with initial home exercise program for self-management of symptoms. Baseline: Initial HEP to be provided at visit 2 as appropriate (09/04/22); Goal status: MET     LONG TERM GOALS: Target date: 11/27/2022   Patient will be independent with a long-term home exercise program for self-management of symptoms.  Baseline: Initial HEP to be provided at visit 2 as appropriate (09/04/22); participating intermittently with room for improvement, is waiting for aquatic HEP in the mail to start going to a local pool (10/17/2022); patient has been provided with long term HEP for aquatic and land exercises (11/06/2022);  Goal status: MET   2.  Patient will demonstrate improved FOTO to equal or greater than 50 by visit #12 to demonstrate improvement in overall condition and self-reported functional ability.  Baseline: 40 (09/04/22); 61 at visit # 9 (10/17/2022); 66 at visit #11 (11/06/2022);  Goal status: MET   3.  Patient will complete floor transfer test in equal or less than 8.8 seconds to demonstrate improved ability to get up and down from the floor so she can play with her grandchildren.  Baseline: 24 seconds (09/04/22); 16 seconds (10/17/2022);  Goal status: Partially met   4.  Patient will ambulate equal or greater than 1.2 m/s in the 10 Meter Walking Trial to improve her safety when walking across an intersection. Baseline: 0.99 m/s (09/04/22); 1 m/s (10/17/2022);  Goal status: Not met   5.  Patient will report decreased pain to equal or less than 3/10 during functional activities to improve her ability to work, and do leisure activities.  Baseline: up to 8/10 (09/04/22); up to 9/10 in the last two weeks during a recent RA flair  (10/17/2022);  Goal status: Not met   6.  Patient will ascend and descend four 6 inch steps using step over step technique with no evidence of lack of control and no use of B UE to improve her ability to use stairs at her home while carrying items and with decreased fall risk.  Baseline: needs B UE support and lacks eccentric control (09/04/2022); continues to need same support, but more confident with increased speed (10/17/2022);  Goal status: Partially met     PLAN:   PT FREQUENCY: 1-2x/week   PT DURATION: 12 weeks   PLANNED INTERVENTIONS: Therapeutic exercises, Therapeutic activity, Neuromuscular re-education, Balance training, Gait training, Patient/Family education, Self Care, Joint mobilization, Stair training, DME instructions, Aquatic Therapy, Dry Needling, Electrical stimulation, Spinal mobilization, Cryotherapy, Moist heat, Manual therapy, and Re-evaluation.   PLAN FOR NEXT SESSION: Patient is now discharged from PT.   Luretha Murphy. Ilsa Iha, PT, DPT 11/06/22, 10:33 AM  [facility] [address] [place], [place] [address] P: [phone] I F: [phone]

## 2022-11-09 NOTE — Telephone Encounter (Signed)
Pharmacy Patient Advocate Encounter  Received notification from [facility] that the request for prior authorization for REPATHA has been denied due to     Please see denial in the patients media.

## 2022-11-12 NOTE — Telephone Encounter (Signed)
Abigail Garcia from [facility] called in asking for update on prior auth request. Informed her as of 11/09/22 it was denied, them and the pt would like to know what the next steps are.

## 2022-11-12 NOTE — Telephone Encounter (Signed)
Per chart review, pt has a hx of CAD s/p stent. Can we file a appeal?

## 2022-11-12 NOTE — Telephone Encounter (Signed)
Looks like pt's insurance requires her to take max tolerated atorvastatin or rosuvastatin and then requires her to take ezetimibe next. She has atorvastatin 40mg  on her med list but it does not seem like she's taking it based on her LDL of 180, fill records also don't show her picking this up.  Dr Windell Hummingbird note mentioned she stopped atorvastatin and experienced nausea on rosuvastatin. Would need to document why she stopped atorvastatin.  Annoying that her plan would require ezetimibe first since her LDL is way above goal (current 180 and goal < 55 with premature ASCVD - hx of NSTEMI and PCI), although she will likely need both ezetimibe and Repatha to reach this goal since her baseline LDL is so high.  Recommend documenting her intolerance to atorvastatin and updating med list/allergy list so this is on file, and starting ezetimibe with repeat lipids in 4 weeks. LDL will still be elevated but hopefully insurance will cover Repatha at that time. Not sure if they will accept nausea as a side effect with rosuvastatin based on the denial info attached, they may also want her to retry a lower dose of this.

## 2022-11-13 MED ORDER — EZETIMIBE 10 MG PO TABS: 10.0000 mg | ORAL_TABLET | Freq: Every day | ORAL | 3 refills | Status: DC

## 2022-11-13 NOTE — Addendum Note (Signed)
Addended by: Jani Gravel on: 11/13/2022 03:09 PM   Modules accepted: Orders

## 2022-11-13 NOTE — Telephone Encounter (Signed)
Called and spoke with patient. Informed her of Dr. Windell Hummingbird recommendations for starting Zetia 10 mg daily and repeating a lipid panel in 3 months. Patient verbalized understanding. Prescription for Zetia sent to preferred pharmacy. Order for Lipid panel placed.

## 2022-11-21 DIAGNOSIS — M0579 Rheumatoid arthritis with rheumatoid factor of multiple sites without organ or systems involvement: Secondary | ICD-10-CM | POA: Diagnosis not present

## 2022-11-21 DIAGNOSIS — Z7689 Persons encountering health services in other specified circumstances: Secondary | ICD-10-CM | POA: Diagnosis not present

## 2022-11-27 ENCOUNTER — Telehealth: Payer: Self-pay | Admitting: Cardiovascular Disease

## 2022-11-27 NOTE — Telephone Encounter (Signed)
Nurse contact pharmacy and informed them that Abigail Garcia's insurance company denied Repatha and requesting Abigail Garcia try zetia. A Rx was sent on 11/13/22. Per Dr. Mariah Milling, Abigail Garcia will have repeat lab work in 3 mo and will reassess the need for repatha at that time.

## 2022-11-27 NOTE — Telephone Encounter (Signed)
Pt c/o medication issue:  1. Name of Medication: Evolocumab (REPATHA SURECLICK) 140 MG/ML SOAJ   2. How are you currently taking this medication (dosage and times per day)? Inject 140 mg into the skin every 14 (fourteen) days.  3. Are you having a reaction (difficulty breathing--STAT)? No   4. What is your medication issue? [facility] was calling to check on the pre-authorization for the medication.

## 2022-11-27 NOTE — Telephone Encounter (Signed)
Pt c/o medication issue:   1. Name of Medication: Evolocumab (REPATHA SURECLICK) 140 MG/ML SOAJ    2. How are you currently taking this medication (dosage and times per day)? Inject 140 mg into the skin every 14 (fourteen) days.   3. Are you having a reaction (difficulty breathing--STAT)? No    4. What is your medication issue? [facility] was calling to check on the pre-authorization for the medication.   Telephone #: [phone] Rx #: [id number]

## 2022-11-29 ENCOUNTER — Ambulatory Visit: Payer: Self-pay | Admitting: Family Medicine

## 2022-11-29 NOTE — Telephone Encounter (Signed)
Caller wants call back to confirm patient will not be taking Repatha but will be taking Zetia.  Reference - Rx#  [id number]  Store # [phone].

## 2022-11-29 NOTE — Telephone Encounter (Signed)
Called and spoke with pharmacy regarding medication clarification.

## 2022-12-02 DIAGNOSIS — Z419 Encounter for procedure for purposes other than remedying health state, unspecified: Secondary | ICD-10-CM | POA: Diagnosis not present

## 2022-12-16 DIAGNOSIS — Z7689 Persons encountering health services in other specified circumstances: Secondary | ICD-10-CM | POA: Diagnosis not present

## 2022-12-16 DIAGNOSIS — M0579 Rheumatoid arthritis with rheumatoid factor of multiple sites without organ or systems involvement: Secondary | ICD-10-CM | POA: Diagnosis not present

## 2022-12-25 ENCOUNTER — Encounter: Payer: Self-pay | Admitting: Family Medicine

## 2023-01-02 DIAGNOSIS — Z7689 Persons encountering health services in other specified circumstances: Secondary | ICD-10-CM | POA: Diagnosis not present

## 2023-01-02 DIAGNOSIS — Z419 Encounter for procedure for purposes other than remedying health state, unspecified: Secondary | ICD-10-CM | POA: Diagnosis not present

## 2023-01-15 DIAGNOSIS — M0579 Rheumatoid arthritis with rheumatoid factor of multiple sites without organ or systems involvement: Secondary | ICD-10-CM | POA: Diagnosis not present

## 2023-01-15 DIAGNOSIS — Z7689 Persons encountering health services in other specified circumstances: Secondary | ICD-10-CM | POA: Diagnosis not present

## 2023-01-15 DIAGNOSIS — Z79899 Other long term (current) drug therapy: Secondary | ICD-10-CM | POA: Diagnosis not present

## 2023-01-28 DIAGNOSIS — Z7689 Persons encountering health services in other specified circumstances: Secondary | ICD-10-CM | POA: Diagnosis not present

## 2023-01-31 ENCOUNTER — Telehealth: Payer: Medicaid Other | Admitting: Nurse Practitioner

## 2023-01-31 ENCOUNTER — Encounter: Payer: Self-pay | Admitting: Family Medicine

## 2023-01-31 DIAGNOSIS — R21 Rash and other nonspecific skin eruption: Secondary | ICD-10-CM | POA: Diagnosis not present

## 2023-02-01 NOTE — Progress Notes (Signed)
E-visit for Shingles   We are sorry that you are not feeling well. Here is how we plan to help!  Based on what you shared with me it looks like you are concerned that you have shingles.  Shingles or herpes zoster, is a common infection of the nerves.  It is a painful rash caused by the herpes zoster virus.  This is the same virus that causes chickenpox.  After a person has chickenpox, the virus remains inactive in the nerve cells.  Years later, the virus can become active again and travel to the skin.  It typically will appear on one side of the face or body.  Burning or shooting pain, tingling, or itching are early signs of the infection.  Blisters typically scab over in 7 to 10 days and clear up within 2-4 weeks. Shingles is only contagious to people that have never had the chickenpox, the chickenpox vaccine, or anyone who has a compromised immune system.  You should avoid contact with these type of people until your blisters scab over.   HOME CARE: Apply ice packs (wrapped in a thin towel), cool compresses, or soak in cool bath to help reduce pain. Use calamine lotion to calm itchy skin. Avoid scratching the rash. Avoid direct sunlight.  GET HELP RIGHT AWAY IF: Symptoms that don't away after treatment. A rash or blisters near your eye. Increased drainage, fever, or rash after treatment. Severe pain that doesn't go away.   MAKE SURE YOU   Understand these instructions. Will watch your condition. Will get help right away if you are not doing well or get worse.  Thank you for choosing an e-visit.  Your e-visit answers were reviewed by a board certified advanced clinical practitioner to complete your personal care plan. Depending upon the condition, your plan could have included both over the counter or prescription medications.  Please review your pharmacy choice. Make sure the pharmacy is open so you can pick up prescription now. If there is a problem, you may contact your provider  through [employer] and have the prescription routed to another pharmacy.  Your safety is important to [place]. If you have drug allergies check your prescription carefully.   For the next 24 hours you can use MyChart to ask questions about today's visit, request a non-urgent call back, or ask for a work or school excuse. You will get an email in the next two days asking about your experience. I hope that your e-visit has been valuable and will speed your recovery.

## 2023-02-01 NOTE — Progress Notes (Signed)
I have spent 5 minutes in review of e-visit questionnaire, review and updating patient chart, medical decision making and response to patient.  ° °Zelda W Fleming, NP ° °  °

## 2023-02-02 DIAGNOSIS — Z419 Encounter for procedure for purposes other than remedying health state, unspecified: Secondary | ICD-10-CM | POA: Diagnosis not present

## 2023-03-04 DIAGNOSIS — Z419 Encounter for procedure for purposes other than remedying health state, unspecified: Secondary | ICD-10-CM | POA: Diagnosis not present

## 2023-03-12 DIAGNOSIS — Z7689 Persons encountering health services in other specified circumstances: Secondary | ICD-10-CM | POA: Diagnosis not present

## 2023-03-12 DIAGNOSIS — M0579 Rheumatoid arthritis with rheumatoid factor of multiple sites without organ or systems involvement: Secondary | ICD-10-CM | POA: Diagnosis not present

## 2023-04-04 ENCOUNTER — Ambulatory Visit: Payer: Medicaid Other | Admitting: Family Medicine

## 2023-04-04 DIAGNOSIS — Z419 Encounter for procedure for purposes other than remedying health state, unspecified: Secondary | ICD-10-CM | POA: Diagnosis not present

## 2023-04-07 DIAGNOSIS — Z7689 Persons encountering health services in other specified circumstances: Secondary | ICD-10-CM | POA: Diagnosis not present

## 2023-04-08 ENCOUNTER — Encounter: Payer: Self-pay | Admitting: Family Medicine

## 2023-04-08 ENCOUNTER — Ambulatory Visit: Payer: Medicaid Other | Admitting: Family Medicine

## 2023-04-08 VITALS — BP 111/77 | HR 86 | Ht 63.0 in | Wt 256.0 lb

## 2023-04-08 DIAGNOSIS — Z7689 Persons encountering health services in other specified circumstances: Secondary | ICD-10-CM | POA: Diagnosis not present

## 2023-04-08 DIAGNOSIS — E782 Mixed hyperlipidemia: Secondary | ICD-10-CM | POA: Diagnosis not present

## 2023-04-08 DIAGNOSIS — R590 Localized enlarged lymph nodes: Secondary | ICD-10-CM | POA: Diagnosis not present

## 2023-04-08 DIAGNOSIS — E236 Other disorders of pituitary gland: Secondary | ICD-10-CM

## 2023-04-08 DIAGNOSIS — M5441 Lumbago with sciatica, right side: Secondary | ICD-10-CM | POA: Diagnosis not present

## 2023-04-08 DIAGNOSIS — M069 Rheumatoid arthritis, unspecified: Secondary | ICD-10-CM

## 2023-04-08 DIAGNOSIS — R519 Headache, unspecified: Secondary | ICD-10-CM | POA: Diagnosis not present

## 2023-04-08 MED ORDER — GABAPENTIN 100 MG PO CAPS
100.0000 mg | ORAL_CAPSULE | Freq: Three times a day (TID) | ORAL | 3 refills | Status: DC
Start: 2023-04-08 — End: 2023-05-20

## 2023-04-08 MED ORDER — MELOXICAM 15 MG PO TABS
15.0000 mg | ORAL_TABLET | Freq: Every day | ORAL | 2 refills | Status: AC
Start: 2023-04-08 — End: ?

## 2023-04-08 NOTE — Assessment & Plan Note (Signed)
Daily headaches for the past month, described as a sensation of movement or pressure in the head. No associated vision changes, ear ringing, or vertigo. Mild weakness noted in right hand. History of head trauma last year with a heavy picture frame. New problem  -Order MRI to rule out structural causes or sensation of increased intracranial pressure. -CBC collected today to evaluate for enlarged LN  .

## 2023-04-08 NOTE — Progress Notes (Signed)
Established patient visit   Patient: Abigail Garcia   DOB: [birth year]   52 y.o. Female  MRN: [id number] Visit Date: 04/08/2023  Today's healthcare provider: [name], MD   No chief complaint on file.  Subjective     HPI   Patient would like discuss with the provider some symptoms she is having and possibly having a MRI. She alsor reports she had received a call from dermatology stating they needed an updated insurance card and she updated her information but has not heard anything in regards to visit since then.  Last edited by Acey Lav, CMA on 04/08/2023  1:50 PM.       Discussed the use of AI scribe software for clinical note transcription with the patient, who gave verbal consent to proceed.  History of Present Illness   The patient, with a history of rheumatoid arthritis (RA) and hypercholesterolemia, presents with concerns about abnormal sensations in the head and swollen lymph nodes in the head and neck region. She describes the sensation as something "running" through her head, not a typical headache. This sensation has been present for about a year, but the patient has noticed it more frequently in the past month, particularly when studying. The patient denies any associated visual changes, but reports a slight ringing in the ears and occasional dizziness.  The patient also reports swollen lymph nodes in the head and neck region, which have been present for about a year. She has not experienced any associated fever, sore throat, ear pain, or pain behind the eyes.  In addition to these symptoms, the patient reports a recent episode of shingles, despite having received the shingles vaccine a month prior. She is currently under high stress due to studying for a significant exam.  The patient's RA is managed with Remicade infusions and Plaquenil, and she reports that her pain has improved since starting the Remicade. She also takes Zetia for  hypercholesterolemia, after a previous medication was not approved by her insurance.  The patient also reports a recent onset of back pain and leg pain, which she self-diagnosed as sciatica. The pain is located in the lower back and extends to the entire leg. The severity of the pain varies from day to day.  The patient has a history of a head injury from a falling picture frame about a year ago, which resulted in some neck pain. She is unsure if this injury is related to her current symptoms.  The patient denies any changes in balance or vertigo-like symptoms. She has not noticed any changes in her blood pressure. She has not started any new medications recently and has not had any changes in her existing medications, except for the switch from Humira to Remicade for her RA.         Past Medical History:  Diagnosis Date   Anemia    Blood transfusion without reported diagnosis    CAD (coronary artery disease)    a. 11/2012 NSTEMI/Cath/PCI: LM nl, LAD 80-90 thrombotic (tx with Heparin x 2 days then aspiration thrombectomy and PTCA), RI nl, LCX sm/nl, RCA nl, EF 55-65%.   Esophagitis    grade 1   Hx of echocardiogram    a. Echo (6/14) with EF 55-60%.    Menorrhagia    NSTEMI (non-ST elevated myocardial infarction) (HCC)    11/2012    Obesity    RA (rheumatoid arthritis) (HCC)     Medications: Outpatient Medications Prior to Visit  Medication Sig  albuterol (PROVENTIL HFA) 108 (90 Base) MCG/ACT inhaler Inhale 1-2 puffs into the lungs every 6 (six) hours as needed for wheezing or shortness of breath.   aspirin 81 MG EC tablet Take 1 tablet (81 mg total) by mouth daily.   hydroxychloroquine (PLAQUENIL) 200 MG tablet Take 200 mg by mouth 2 (two) times daily.   megestrol (MEGACE) 40 MG tablet TAKE 2 TABLETS BY MOUTH DAILY. CAN INCREASE TO 2 TABLETS TWICE DAILY FOR HEAVY BLEEDING   methotrexate (RHEUMATREX) 2.5 MG tablet Take 15 mg by mouth once a week. Take 6 tablets(15 mg total) by mouth  every 7 days for 90 days   metoprolol tartrate (LOPRESSOR) 25 MG tablet Take 0.5 tablets (12.5 mg total) by mouth 2 (two) times daily.   [DISCONTINUED] HUMIRA, 2 PEN, 40 MG/0.4ML PNKT Inject 40 mg into the skin once a week.   [DISCONTINUED] meloxicam (MOBIC) 15 MG tablet Take 15 mg by mouth daily.   ezetimibe (ZETIA) 10 MG tablet Take 1 tablet (10 mg total) by mouth daily.   predniSONE (DELTASONE) 5 MG tablet Take 1 tablet (5 mg total) by mouth daily with breakfast. (Patient not taking: Reported on 04/08/2023)   [DISCONTINUED] atorvastatin (LIPITOR) 40 MG tablet Take 1 tablet (40 mg total) by mouth daily. (Patient not taking: Reported on 04/08/2023)   [DISCONTINUED] Evolocumab (REPATHA SURECLICK) 140 MG/ML SOAJ Inject 140 mg into the skin every 14 (fourteen) days. (Patient not taking: Reported on 04/08/2023)   No facility-administered medications prior to visit.    Review of Systems  Last CBC Lab Results  Component Value Date   WBC 9.8 10/22/2022   HGB 12.1 10/22/2022   HCT 38.2 10/22/2022   MCV 76 (L) 10/22/2022   MCH 24.0 (L) 10/22/2022   RDW 15.0 10/22/2022   PLT 459 (H) 10/22/2022   Last metabolic panel Lab Results  Component Value Date   GLUCOSE 87 10/22/2022   NA 141 10/22/2022   K 4.3 10/22/2022   CL 106 10/22/2022   CO2 20 10/22/2022   BUN 16 10/22/2022   CREATININE 0.89 10/22/2022   EGFR 78 10/22/2022   CALCIUM 10.0 10/22/2022   PROT 7.7 10/22/2022   ALBUMIN 4.0 10/22/2022   LABGLOB 3.7 10/22/2022   AGRATIO 1.1 (L) 10/22/2022   BILITOT 0.3 10/22/2022   ALKPHOS 79 10/22/2022   AST 11 10/22/2022   ALT 10 10/22/2022   ANIONGAP 10 03/14/2019   Last lipids Lab Results  Component Value Date   CHOL 243 (H) 10/22/2022   HDL 42 10/22/2022   LDLCALC 180 (H) 10/22/2022   TRIG 114 10/22/2022   CHOLHDL 5.8 (H) 10/22/2022   Last thyroid functions Lab Results  Component Value Date   TSH 1.030 10/22/2022        Objective    BP 111/77 (BP Location: Left Arm,  Patient Position: Sitting, Cuff Size: Large)   Pulse 86   Ht 5\' 3"  (1.6 m)   Wt 256 lb (116.1 kg)   SpO2 100%   BMI 45.35 kg/m  BP Readings from Last 3 Encounters:  04/08/23 111/77  11/05/22 110/70  10/22/22 121/84   Wt Readings from Last 3 Encounters:  04/08/23 256 lb (116.1 kg)  11/05/22 253 lb 4 oz (114.9 kg)  10/22/22 253 lb 9.6 oz (115 kg)          Physical Exam  Physical Exam   VITALS: BP normal HEENT: No signs of ear infection or congestion. TM are non-erythematous. Enlarged lymph nodes palpated in anterior  cervical chain on left and posterior auricular region NEUROLOGICAL: Hand grip strength slightly weaker on the right. Upper extremity strength normal through push and pull motions. Extraocular movements intact. No nystagmus or vertigo. CN appear normal bilaterally  CARDS: RRR  PULM: CTAB       No results found for any visits on 04/08/23.  Assessment & Plan     Problem List Items Addressed This Visit       Musculoskeletal and Integument   Rheumatoid arthritis flare (HCC)    Chronic Managed by rheumatology  Patient on Remicade infusions, Methotrexate 15mg  weekly, and Plaquenil 200mg  BID. Meloxicam 15mg  used PRN for pain, currently every other day. -Continue current regimen.      Relevant Medications   meloxicam (MOBIC) 15 MG tablet     Other   Pressure in head - Primary    Daily headaches for the past month, described as a sensation of movement or pressure in the head. No associated vision changes, ear ringing, or vertigo. Mild weakness noted in right hand. History of head trauma last year with a heavy picture frame. New problem  -Order MRI to rule out structural causes or sensation of increased intracranial pressure. -CBC collected today to evaluate for enlarged LN  .      Relevant Medications   meloxicam (MOBIC) 15 MG tablet   gabapentin (NEURONTIN) 100 MG capsule   Other Relevant Orders   MR Brain Wo Contrast   CBC   Hyperlipidemia     Patient on Zetia 10mg  for cholesterol management. Previous attempt to start Repatha was not approved by insurance. -Continue Zetia 10mg .      Other Visit Diagnoses     LAD (lymphadenopathy) of left cervical region       Relevant Orders   CBC   Acute right-sided low back pain with right-sided sciatica       Relevant Medications   meloxicam (MOBIC) 15 MG tablet   gabapentin (NEURONTIN) 100 MG capsule         Return in about 6 weeks (around 05/20/2023) for head pressure .       [name], MD  [facility] [phone] (phone) [phone] (fax)  [facility]

## 2023-04-08 NOTE — Assessment & Plan Note (Signed)
Chronic Managed by rheumatology  Patient on Remicade infusions, Methotrexate 15mg  weekly, and Plaquenil 200mg  BID. Meloxicam 15mg  used PRN for pain, currently every other day. -Continue current regimen.

## 2023-04-08 NOTE — Assessment & Plan Note (Signed)
Patient on Zetia 10mg  for cholesterol management. Previous attempt to start Repatha was not approved by insurance. -Continue Zetia 10mg .

## 2023-04-09 LAB — CBC
Hematocrit: 41 % (ref 34.0–46.6)
Hemoglobin: 12.9 g/dL (ref 11.1–15.9)
MCH: 24.3 pg — ABNORMAL LOW (ref 26.6–33.0)
MCHC: 31.5 g/dL (ref 31.5–35.7)
MCV: 77 fL — ABNORMAL LOW (ref 79–97)
Platelets: 385 10*3/uL (ref 150–450)
RBC: 5.31 x10E6/uL — ABNORMAL HIGH (ref 3.77–5.28)
RDW: 15.8 % — ABNORMAL HIGH (ref 11.7–15.4)
WBC: 8.2 10*3/uL (ref 3.4–10.8)

## 2023-04-15 ENCOUNTER — Ambulatory Visit
Admission: RE | Admit: 2023-04-15 | Discharge: 2023-04-15 | Disposition: A | Discharge status: Home / Self Care | Payer: Medicaid Other | Source: Ambulatory Visit | Attending: Family Medicine | Admitting: Family Medicine

## 2023-04-15 DIAGNOSIS — R519 Headache, unspecified: Secondary | ICD-10-CM | POA: Insufficient documentation

## 2023-04-15 DIAGNOSIS — R930 Abnormal findings on diagnostic imaging of skull and head, not elsewhere classified: Secondary | ICD-10-CM | POA: Diagnosis not present

## 2023-04-22 ENCOUNTER — Encounter: Payer: Self-pay | Admitting: Family Medicine

## 2023-05-04 DIAGNOSIS — Z419 Encounter for procedure for purposes other than remedying health state, unspecified: Secondary | ICD-10-CM | POA: Diagnosis not present

## 2023-05-06 ENCOUNTER — Encounter: Payer: Self-pay | Admitting: Family Medicine

## 2023-05-06 NOTE — Addendum Note (Signed)
Addended by: Bing Neighbors on: 05/06/2023 04:43 PM   Modules accepted: Orders

## 2023-05-07 DIAGNOSIS — M0579 Rheumatoid arthritis with rheumatoid factor of multiple sites without organ or systems involvement: Secondary | ICD-10-CM | POA: Diagnosis not present

## 2023-05-19 ENCOUNTER — Encounter: Payer: Self-pay | Admitting: Emergency Medicine

## 2023-05-20 ENCOUNTER — Encounter: Payer: Self-pay | Admitting: Family Medicine

## 2023-05-20 ENCOUNTER — Telehealth: Payer: Self-pay | Admitting: Family Medicine

## 2023-05-20 ENCOUNTER — Ambulatory Visit: Payer: Medicaid Other | Admitting: Family Medicine

## 2023-05-20 VITALS — BP 100/78 | HR 85 | Temp 97.7°F | Ht 63.0 in | Wt 261.0 lb

## 2023-05-20 DIAGNOSIS — L720 Epidermal cyst: Secondary | ICD-10-CM

## 2023-05-20 DIAGNOSIS — E236 Other disorders of pituitary gland: Secondary | ICD-10-CM | POA: Diagnosis not present

## 2023-05-20 DIAGNOSIS — Z6841 Body Mass Index (BMI) 40.0 and over, adult: Secondary | ICD-10-CM | POA: Diagnosis not present

## 2023-05-20 DIAGNOSIS — R519 Headache, unspecified: Secondary | ICD-10-CM | POA: Diagnosis not present

## 2023-05-20 DIAGNOSIS — E66813 Obesity, class 3: Secondary | ICD-10-CM

## 2023-05-20 MED ORDER — SEMAGLUTIDE-WEIGHT MANAGEMENT 0.25 MG/0.5ML ~~LOC~~ SOAJ: 0.2500 mg | SUBCUTANEOUS | 0 refills | Status: AC

## 2023-05-20 NOTE — Assessment & Plan Note (Signed)
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 Pt reports she has Updated insurance information to facilitate referral. - Provided referral to dermatologist

## 2023-05-20 NOTE — Patient Instructions (Addendum)
Neurology, [facility]  [address]  [place], [place] [address]  [phone]  Please call the neurology office to schedule an appointment     VISIT SUMMARY:  During today's visit, we discussed your recent MRI finding of an empty sella, your intermittent headaches, weight management challenges, a chronic skin lesion, and your concerns about hypertension and vaccines. We reviewed your current medications and past experiences with treatments, and we have outlined a plan to address each of your health concerns.  YOUR PLAN:  -EMPTY SELLA SYNDROME: Empty Sella Syndrome is a condition where the pituitary gland shrinks or becomes flattened, which can sometimes be due to increased pressure in the brain or hormonal imbalances. We will refer you to a neurologist for further evaluation and potential treatments, which may include medications to reduce pressure or hormone therapy. Please schedule an appointment with the neurologist as soon as possible.  -HEADACHES: Your intermittent headaches may be related to Empty Sella Syndrome. Since you had adverse effects with gabapentin, we will monitor the frequency and intensity of your headaches and await the neurologist's evaluation for further management.  -OBESITY: Obesity, defined as having a BMI over 30, can contribute to various health issues, including pseudotumor cerebri. We discussed starting semaglutide [facility]) at 0.25 mg weekly to help with weight loss. Please follow up in one month to assess your progress, and we may consider a referral to bariatric surgery if necessary.  -HYPERTENSION: Your blood pressure was slightly elevated today, which may be due to stress. We will recheck your blood pressure at your next visit and ask you to monitor it regularly at home.  -CHRONIC SKIN LESION: You have a chronic skin lesion on your back that needs further evaluation. We will provide a referral to a dermatologist to address this issue.  -GENERAL HEALTH  MAINTENANCE: Given your compromised immune system and past adverse reactions to vaccines, we discussed the importance of staying up-to-date with vaccinations. We recommend the pneumococcal and COVID-19 vaccines and will address your concerns about the Shingrix vaccine. We will also check your vitamin D levels during your physical exam in April.  INSTRUCTIONS:  Please follow up with the neurologist after scheduling your appointment and with the dermatologist after your referral. Additionally, schedule your physical exam in April.

## 2023-05-20 NOTE — Assessment & Plan Note (Signed)
Intermittent headaches, possibly related to empty sella syndrome. Previous treatment with gabapentin was discontinued due to adverse effects (suicidal thoughts). Headaches vary in intensity, sometimes severe enough to disrupt sleep. - Monitor headache frequency and intensity - Await neurology evaluation for further management - chart review shows that neurology has not been able to contact patient in order to schedule appt  - phone number given to the patient today in order to schedule with specialist

## 2023-05-20 NOTE — Assessment & Plan Note (Signed)
Obesity BMI 46.23, potential contributing factor to increased headaches, considering potentially underlying pseudotumor cerebri  Difficulty losing weight despite diet and exercise for 16 years, per patietn. Discussed potential benefits of semaglutide [facility]) for weight loss, including decreased appetite and potential side effects such as constipation or diarrhea. No contraindications for semaglutide as patient denies family or personal history of  - Initiate semaglutide [facility]) 0.25 mg weekly - Follow up in one month for weight management

## 2023-05-20 NOTE — Progress Notes (Signed)
Established patient visit   Patient: Abigail Garcia   DOB: 09-22-1970   52 y.o. Female  MRN: [id number] Visit Date: 05/20/2023  Today's healthcare provider: [name], MD   Chief Complaint  Patient presents with   Headache    Patient states she has had her MRI and is waiting for neurology appointment.  Patient states it comes and goes.  Neurology has tried contacting her and left messages but the patient states she did not receive any messages.  Patient has been instructed to contact them. Patient states she continues to have episodes but can not describe how often or exactly what she feels when she has them   Subjective     HPI     Headache    Additional comments: Patient states she has had her MRI and is waiting for neurology appointment.  Patient states it comes and goes.  Neurology has tried contacting her and left messages but the patient states she did not receive any messages.  Patient has been instructed to contact them. Patient states she continues to have episodes but can not describe how often or exactly what she feels when she has them      Last edited by Adline Peals, CMA on 05/20/2023  9:56 AM.       Discussed the use of AI scribe software for clinical note transcription with the patient, who gave verbal consent to proceed.  History of Present Illness   The patient, with a history of a compromised immune system due to Hx of RA on Remicade, presents with concerns about a recent MRI finding of an empty sella. She reports experiencing intermittent headaches, which they describe as a feeling of pressure, particularly in the frontal region. The headaches are not consistent, varying in intensity and frequency, with some episodes severe enough to disrupt sleep. The patient denies any associated vision problems but expresses concern about whether the headaches could be related to menopause or low vitamin D levels.  In addition to the headaches, the  patient reports a long-standing issue with a skin lesion on her back, which she has not  been able to manage independently. They also mention a history of shingles following the first dose of the Shingrix vaccine, which has made her hesitant to receive the second dose.  The patient has been struggling with weight management for several years despite maintaining a balanced diet and engaging in physical therapy. She  expresss concern about whether her weight could be contributing to their current health issues. 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  The patient is currently on multiple medications, including Remicade. They report dissatisfaction with a previous prescription of gabapentin due to its side effects. They have been managing their health with the help of a primary care provider and a gynecologist, with a gynecology appointment scheduled for the following month.          Past Medical History:  Diagnosis Date   Anemia    Blood transfusion without reported diagnosis    CAD (coronary artery disease)    a. 11/2012 NSTEMI/Cath/PCI: LM nl, LAD 80-90 thrombotic (tx with Heparin x 2 days then aspiration thrombectomy and PTCA), RI nl, LCX sm/nl, RCA nl, EF 55-65%.   Esophagitis    grade 1   Hx of echocardiogram    a. Echo (6/14) with EF 55-60%.    Menorrhagia    NSTEMI (non-ST elevated myocardial  infarction) (HCC)    11/2012    Obesity    RA (rheumatoid arthritis) (HCC)     Medications: Outpatient Medications Prior to Visit  Medication Sig   albuterol (PROVENTIL HFA) 108 (90 Base) MCG/ACT inhaler Inhale 1-2 puffs into the lungs every 6 (six) hours as needed for wheezing or shortness of breath.   aspirin 81 MG EC tablet Take 1 tablet (81 mg total) by mouth daily.   hydroxychloroquine (PLAQUENIL) 200 MG tablet Take 200 mg by mouth 2 (two) times daily.   megestrol (MEGACE) 40 MG tablet TAKE 2 TABLETS BY MOUTH DAILY. CAN  INCREASE TO 2 TABLETS TWICE DAILY FOR HEAVY BLEEDING   meloxicam (MOBIC) 15 MG tablet Take 1 tablet (15 mg total) by mouth daily.   methotrexate (RHEUMATREX) 2.5 MG tablet Take 15 mg by mouth once a week. Take 6 tablets(15 mg total) by mouth every 7 days for 90 days   metoprolol tartrate (LOPRESSOR) 25 MG tablet Take 0.5 tablets (12.5 mg total) by mouth 2 (two) times daily.   [DISCONTINUED] gabapentin (NEURONTIN) 100 MG capsule Take 1 capsule (100 mg total) by mouth 3 (three) times daily.   ezetimibe (ZETIA) 10 MG tablet Take 1 tablet (10 mg total) by mouth daily.   [DISCONTINUED] predniSONE (DELTASONE) 5 MG tablet Take 1 tablet (5 mg total) by mouth daily with breakfast. (Patient not taking: Reported on 04/08/2023)   No facility-administered medications prior to visit.    Review of Systems  Last CBC Lab Results  Component Value Date   WBC 8.2 04/08/2023   HGB 12.9 04/08/2023   HCT 41.0 04/08/2023   MCV 77 (L) 04/08/2023   MCH 24.3 (L) 04/08/2023   RDW 15.8 (H) 04/08/2023   PLT 385 04/08/2023   Last metabolic panel Lab Results  Component Value Date   GLUCOSE 87 10/22/2022   NA 141 10/22/2022   K 4.3 10/22/2022   CL 106 10/22/2022   CO2 20 10/22/2022   BUN 16 10/22/2022   CREATININE 0.89 10/22/2022   EGFR 78 10/22/2022   CALCIUM 10.0 10/22/2022   PROT 7.7 10/22/2022   ALBUMIN 4.0 10/22/2022   LABGLOB 3.7 10/22/2022   AGRATIO 1.1 (L) 10/22/2022   BILITOT 0.3 10/22/2022   ALKPHOS 79 10/22/2022   AST 11 10/22/2022   ALT 10 10/22/2022   ANIONGAP 10 03/14/2019   Last lipids Lab Results  Component Value Date   CHOL 243 (H) 10/22/2022   HDL 42 10/22/2022   LDLCALC 180 (H) 10/22/2022   TRIG 114 10/22/2022   CHOLHDL 5.8 (H) 10/22/2022   Last hemoglobin A1c Lab Results  Component Value Date   HGBA1C 5.7 (H) 10/22/2022   Last thyroid functions Lab Results  Component Value Date   TSH 1.030 10/22/2022   Last vitamin D Lab Results  Component Value Date   VD25OH  23.3 (L) 10/22/2022       Objective    BP 100/78   Pulse 85   Temp 97.7 F (36.5 C) (Oral)   Ht 5\' 3"  (1.6 m)   Wt 261 lb (118.4 kg)   SpO2 100%   BMI 46.23 kg/m   BP Readings from Last 3 Encounters:  05/20/23 100/78  04/08/23 111/77  11/05/22 110/70   Wt Readings from Last 3 Encounters:  05/20/23 261 lb (118.4 kg)  04/08/23 256 lb (116.1 kg)  11/05/22 253 lb 4 oz (114.9 kg)       Physical Exam Vitals reviewed.  Constitutional:  General: She is not in acute distress.    Appearance: Normal appearance. She is not ill-appearing, toxic-appearing or diaphoretic.  Eyes:     Conjunctiva/sclera: Conjunctivae normal.  Cardiovascular:     Rate and Rhythm: Normal rate and regular rhythm.     Pulses: Normal pulses.     Heart sounds: Normal heart sounds. No murmur heard.    No friction rub. No gallop.  Pulmonary:     Effort: Pulmonary effort is normal. No respiratory distress.     Breath sounds: Normal breath sounds. No stridor. No wheezing, rhonchi or rales.  Abdominal:     General: Bowel sounds are normal. There is no distension.     Palpations: Abdomen is soft.     Tenderness: There is no abdominal tenderness.  Musculoskeletal:     Right lower leg: No edema.     Left lower leg: No edema.  Skin:    Findings: No erythema or rash.     Comments: Cyst on back, no overlying erythema   Neurological:     Mental Status: She is alert and oriented to person, place, and time.       Media Information   Document Information  Photos  Cyst  10/22/2022 14:51  Attached To:  Office Visit on 10/22/22 with [name], MD  Source Information  [name], MD  [facility]  Document History     No results found for any visits on 05/20/23.  Assessment & Plan     Problem List Items Addressed This Visit       Other   Pressure in head   Intermittent headaches, possibly related to empty sella syndrome. Previous treatment with  gabapentin was discontinued due to adverse effects (suicidal thoughts). Headaches vary in intensity, sometimes severe enough to disrupt sleep. - Monitor headache frequency and intensity - Await neurology evaluation for further management - chart review shows that neurology has not been able to contact patient in order to schedule appt  - phone number given to the patient today in order to schedule with specialist        Obesity   Obesity BMI 46.23, potential contributing factor to increased headaches, considering potentially underlying pseudotumor cerebri  Difficulty losing weight despite diet and exercise for 16 years, per patietn. Discussed potential benefits of semaglutide [facility]) for weight loss, including decreased appetite and potential side effects such as constipation or diarrhea. 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 Pt reports she has Updated insurance information to facilitate referral. - Provided referral to dermatologist      Relevant Orders   Ambulatory referral to Dermatology   Other Visit Diagnoses       Empty sella (HCC)    -  Primary          Empty Sella found on MRI  Identified on MRI, potentially due to increased intracranial pressure or hormonal imbalance. Symptoms include intermittent headaches without vision problems. Neurology referral pending for further evaluation. Discussed potential treatments: medication for pressure reduction or hormone therapy. Patient expressed concern about the unknowns and stress related to the condition. - Provide neurology referral contact information - Encourage scheduling neurology  appointment - Discuss potential treatments based on neurology findings     General Health Maintenance Compromised immune system and hesitant about vaccines due to past adverse reactions. Blood pressure and cholesterol levels need monitoring. Discussed importance of pneumococcal, COVID-19, and Shingrix vaccines. Adverse reaction to first Shingrix dose but advised on importance of completing the series. - Recommend pneumococcal vaccine - Recommend COVID-19 vaccine - Discussed Shingrix vaccine and address concerns, recommend completing vaccination series, patient reports she did not get the second dose of the vaccine  - Schedule physical exam in April 2025 - Check vitamin D levels during physical exam      Return in about 1 month (around 06/20/2023) for Weight MGMT.       [name], MD  [facility] [phone] (phone) [phone] (fax)  [facility]

## 2023-05-20 NOTE — Telephone Encounter (Signed)
[facility] pharmacy is requesting prior authorization Key: [id number] [facility] 0.25mg /0.25ML auto injectors

## 2023-05-22 NOTE — Telephone Encounter (Signed)
PA sent to plan

## 2023-05-22 NOTE — Telephone Encounter (Signed)
Pa started and notes faxed

## 2023-05-23 ENCOUNTER — Telehealth: Payer: Self-pay | Admitting: Family Medicine

## 2023-05-23 NOTE — Telephone Encounter (Signed)
[facility] APPROVED   05/22/23- 11/18/2023   2mL refills for every 28 days

## 2023-05-23 NOTE — Telephone Encounter (Signed)
-----   Message from [employer] [facility] sent at 05/22/2023  2:43 PM EST ----- Incoming Fax needs reviewing:

## 2023-06-10 ENCOUNTER — Ambulatory Visit (INDEPENDENT_AMBULATORY_CARE_PROVIDER_SITE_OTHER): Payer: Medicaid Other | Admitting: Obstetrics & Gynecology

## 2023-06-10 ENCOUNTER — Other Ambulatory Visit (HOSPITAL_COMMUNITY)
Admission: RE | Admit: 2023-06-10 | Discharge: 2023-06-10 | Disposition: A | Discharge status: Home / Self Care | Payer: Medicaid Other | Source: Ambulatory Visit | Attending: Obstetrics & Gynecology | Admitting: Obstetrics & Gynecology

## 2023-06-10 ENCOUNTER — Encounter: Payer: Self-pay | Admitting: Obstetrics & Gynecology

## 2023-06-10 VITALS — BP 133/87 | HR 83 | Wt 257.0 lb

## 2023-06-10 DIAGNOSIS — Z113 Encounter for screening for infections with a predominantly sexual mode of transmission: Secondary | ICD-10-CM | POA: Diagnosis not present

## 2023-06-10 DIAGNOSIS — Z7689 Persons encountering health services in other specified circumstances: Secondary | ICD-10-CM | POA: Diagnosis not present

## 2023-06-10 DIAGNOSIS — Z1151 Encounter for screening for human papillomavirus (HPV): Secondary | ICD-10-CM

## 2023-06-10 DIAGNOSIS — Z01419 Encounter for gynecological examination (general) (routine) without abnormal findings: Secondary | ICD-10-CM | POA: Insufficient documentation

## 2023-06-10 DIAGNOSIS — Z1231 Encounter for screening mammogram for malignant neoplasm of breast: Secondary | ICD-10-CM

## 2023-06-10 DIAGNOSIS — Z1211 Encounter for screening for malignant neoplasm of colon: Secondary | ICD-10-CM

## 2023-06-10 NOTE — Progress Notes (Signed)
 GYNECOLOGY ANNUAL PREVENTATIVE CARE ENCOUNTER NOTE  History:     Abigail Garcia is a 53 y.o. [phone] female here for a routine annual gynecologic exam.  Current complaints: none.  Last period was in May 2024, was heavy and had to take course of Megace  to help. No bleeding since.  No concerning perimenopausal symptoms.  Denies abnormal vaginal bleeding, discharge, pelvic pain, problems with intercourse or other gynecologic concerns.    Gynecologic History No LMP recorded. Contraception: none Last Pap: 09/07/2020. Result was normal with negative HPV Last Mammogram: 10/05/2021.  Result was normal Last Colonoscopy: 09/30/2014.  Result was normal  Obstetric History OB History  Gravida Para Term Preterm AB Living  10 4 2 2 4 4   SAB IAB Ectopic Multiple Live Births  4 0 0 1 3    # Outcome Date GA Lbr Len/2nd Weight Sex Type Anes PTL Lv  10A Preterm 2005 [redacted]w[redacted]d    CS-Unspec        Birth Comments: lost 80 lbs during pregnancy  10B Preterm      CS-Unspec     9 SAB 2004          8 SAB 2004          7 SAB 2004             Birth Comments: had D&C  6 SAB 2004          5 Term 1997 [redacted]w[redacted]d  7 lb (3.175 kg) F    LIV     Birth Comments: no complications  4 Term 73 [redacted]w[redacted]d    Vag-Spont   LIV     Birth Comments: no complications  3 Gravida           2 Gravida           1 Preterm             Past Medical History:  Diagnosis Date   Anemia    Blood transfusion without reported diagnosis    CAD (coronary artery disease)    a. 11/2012 NSTEMI/Cath/PCI: LM nl, LAD 80-90 thrombotic (tx with Heparin  x 2 days then aspiration thrombectomy and PTCA), RI nl, LCX sm/nl, RCA nl, EF 55-65%.   Esophagitis    grade 1   Hx of echocardiogram    a. Echo (6/14) with EF 55-60%.    Menorrhagia    NSTEMI (non-ST elevated myocardial infarction) (HCC)    11/2012    Obesity    RA (rheumatoid arthritis) (HCC)     Past Surgical History:  Procedure Laterality Date   bilateral tubal     CARDIAC CATHETERIZATION  2014    CESAREAN SECTION     CORONARY ANGIOGRAM  11/19/2012   Procedure: CORONARY ANGIOGRAM;  Surgeon: Peter M Jordan, MD;  Location: [facility] CATH LAB;  Service: Cardiovascular;;   DILATION AND CURETTAGE OF UTERUS     DILATION AND CURETTAGE OF UTERUS N/A 03/13/2019   Procedure: DILATATION AND CURETTAGE;  Surgeon: Starla Harland BROCKS, MD;  Location: [facility] OR;  Service: Gynecology;  Laterality: N/A;   INTRAVASCULAR ULTRASOUND  11/19/2012   Procedure: INTRAVASCULAR ULTRASOUND;  Surgeon: Peter M Jordan, MD;  Location: [facility] CATH LAB;  Service: Cardiovascular;;   LEFT HEART CATHETERIZATION WITH CORONARY ANGIOGRAM N/A 11/16/2012   Procedure: LEFT HEART CATHETERIZATION WITH CORONARY ANGIOGRAM;  Surgeon: Peter M Jordan, MD;  Location: [facility] CATH LAB;  Service: Cardiovascular;  Laterality: N/A;   PERCUTANEOUS CORONARY INTERVENTION-BALLOON ONLY  11/19/2012   Procedure: PERCUTANEOUS CORONARY  INTERVENTION-BALLOON ONLY;  Surgeon: Peter M Jordan, MD;  Location: [facility] CATH LAB;  Service: Cardiovascular;;    Current Outpatient Medications on File Prior to Visit  Medication Sig Dispense Refill   albuterol  (PROVENTIL  HFA) 108 (90 Base) MCG/ACT inhaler Inhale 1-2 puffs into the lungs every 6 (six) hours as needed for wheezing or shortness of breath. 8 g 0   aspirin  81 MG EC tablet Take 1 tablet (81 mg total) by mouth daily. 30 tablet 0   hydroxychloroquine (PLAQUENIL) 200 MG tablet Take 200 mg by mouth 2 (two) times daily.     megestrol  (MEGACE ) 40 MG tablet TAKE 2 TABLETS BY MOUTH DAILY. CAN INCREASE TO 2 TABLETS TWICE DAILY FOR HEAVY BLEEDING 60 tablet 5   meloxicam  (MOBIC ) 15 MG tablet Take 1 tablet (15 mg total) by mouth daily. 90 tablet 2   methotrexate (RHEUMATREX) 2.5 MG tablet Take 15 mg by mouth once a week. Take 6 tablets(15 mg total) by mouth every 7 days for 90 days     metoprolol  tartrate (LOPRESSOR ) 25 MG tablet Take 0.5 tablets (12.5 mg total) by mouth 2 (two) times daily. 180 tablet 1   ezetimibe  (ZETIA ) 10 MG tablet Take 1  tablet (10 mg total) by mouth daily. 90 tablet 3   Semaglutide -Weight Management 0.25 MG/0.5ML SOAJ Inject 0.25 mg into the skin once a week for 28 days. (Patient not taking: Reported on 06/10/2023) 2 mL 0   No current facility-administered medications on file prior to visit.    No Known Allergies  Social History:  reports that she has never smoked. She has never used smokeless tobacco. She reports that she does not drink alcohol and does not use drugs.  Family History  Problem Relation Age of Onset   Heart disease Other    Breast cancer Paternal Grandmother     The following portions of the patient's history were reviewed and updated as appropriate: allergies, current medications, past family history, past medical history, past social history, past surgical history and problem list.  Review of Systems Pertinent items noted in HPI and remainder of comprehensive ROS otherwise negative.  Physical Exam:  BP 133/87   Pulse 83   Wt 257 lb (116.6 kg)   BMI 45.53 kg/m  CONSTITUTIONAL: Well-developed, well-nourished female in no acute distress.  HENT:  Normocephalic, atraumatic, External right and left ear normal.  EYES: Conjunctivae and EOM are normal. Pupils are equal, round, and reactive to light. No scleral icterus.  NECK: Normal range of motion, supple, no masses.  Normal thyroid .  SKIN: Skin is warm and dry. No rash noted. Not diaphoretic. No erythema. No pallor. MUSCULOSKELETAL: Normal range of motion. No tenderness.  No cyanosis, clubbing, or edema. NEUROLOGIC: Alert and oriented to person, place, and time. Normal reflexes, muscle tone coordination.  PSYCHIATRIC: Normal mood and affect. Normal behavior. Normal judgment and thought content. CARDIOVASCULAR: Normal heart rate noted, regular rhythm RESPIRATORY: Clear to auscultation bilaterally. Effort and breath sounds normal, no problems with respiration noted. BREASTS: Symmetric in size. No masses, tenderness, skin changes, nipple  drainage, or lymphadenopathy bilaterally. Performed in the presence of a chaperone. ABDOMEN: Soft, no distention noted.  No tenderness, rebound or guarding.  PELVIC: Normal appearing external genitalia and urethral meatus; normal appearing vaginal mucosa and cervix.  No abnormal vaginal discharge noted.  Pap smear obtained.  Normal uterine size, no other palpable masses, no uterine or adnexal tenderness.  Performed in the presence of a chaperone.   Assessment and Plan:  1. Colon cancer screening Benign colonoscopy in 2016 done for evaluation of anemia, recall colonoscopy was recommended at age 42 but she has not had one yet. Referral sent.  - Ambulatory referral to Gastroenterology  2. Breast cancer screening by mammogram Mammogram scheduled for breast cancer screening. - MM 3D SCREENING MAMMOGRAM BILATERAL BREAST; Future  3. Routine screening for STI (sexually transmitted infection) STI screen requested, will follow up results and manage accordingly. - Cytology - PAP ancillary testing - RPR+HBsAg+HCVAb+HIV  4. Well woman exam with routine gynecological exam (Primary) - Cytology - PAP Will follow up results of pap smear and manage accordingly. Routine preventative health maintenance measures emphasized. Please refer to After Visit Summary for other counseling recommendations.      GLORIS HUGGER, MD, FACOG Obstetrician & Gynecologist, [facility] for [employer], [facility]

## 2023-06-11 LAB — RPR+HBSAG+HCVAB+...
HIV Screen 4th Generation wRfx: NONREACTIVE
Hep C Virus Ab: NONREACTIVE
Hepatitis B Surface Ag: NEGATIVE
RPR Ser Ql: NONREACTIVE

## 2023-06-12 LAB — CYTOLOGY - PAP
Chlamydia: NEGATIVE
Comment: NEGATIVE
Comment: NEGATIVE
Comment: NEGATIVE
Comment: NEGATIVE
Comment: NEGATIVE
Comment: NORMAL
Diagnosis: UNDETERMINED — AB
HPV 16: NEGATIVE
HPV 18 / 45: NEGATIVE
High risk HPV: POSITIVE — AB
Neisseria Gonorrhea: NEGATIVE
Trichomonas: NEGATIVE

## 2023-06-13 ENCOUNTER — Encounter: Payer: Self-pay | Admitting: Obstetrics & Gynecology

## 2023-06-13 DIAGNOSIS — R87610 Atypical squamous cells of undetermined significance on cytologic smear of cervix (ASC-US): Secondary | ICD-10-CM | POA: Insufficient documentation

## 2023-06-13 NOTE — Progress Notes (Signed)
 Please schedule patient for colposcopy for ASCUS +HRHPV pap done on 06/10/2023. Please call to inform patient of results and need for appointment.

## 2023-06-15 DIAGNOSIS — Z419 Encounter for procedure for purposes other than remedying health state, unspecified: Secondary | ICD-10-CM | POA: Diagnosis not present

## 2023-06-24 ENCOUNTER — Ambulatory Visit
Admission: RE | Admit: 2023-06-24 | Discharge: 2023-06-24 | Disposition: A | Discharge status: Home / Self Care | Payer: Medicaid Other | Attending: Family Medicine | Admitting: Family Medicine

## 2023-06-24 ENCOUNTER — Ambulatory Visit: Payer: Medicaid Other | Admitting: Family Medicine

## 2023-06-24 ENCOUNTER — Encounter: Payer: Self-pay | Admitting: Family Medicine

## 2023-06-24 ENCOUNTER — Ambulatory Visit
Admission: RE | Admit: 2023-06-24 | Discharge: 2023-06-24 | Disposition: A | Discharge status: Home / Self Care | Payer: Medicaid Other | Source: Ambulatory Visit | Attending: Family Medicine | Admitting: Family Medicine

## 2023-06-24 VITALS — BP 114/88 | HR 93 | Ht 63.0 in | Wt 253.0 lb

## 2023-06-24 DIAGNOSIS — R059 Cough, unspecified: Secondary | ICD-10-CM | POA: Diagnosis not present

## 2023-06-24 DIAGNOSIS — Z6841 Body Mass Index (BMI) 40.0 and over, adult: Secondary | ICD-10-CM

## 2023-06-24 DIAGNOSIS — R0781 Pleurodynia: Secondary | ICD-10-CM | POA: Insufficient documentation

## 2023-06-24 DIAGNOSIS — E66813 Obesity, class 3: Secondary | ICD-10-CM

## 2023-06-24 DIAGNOSIS — R918 Other nonspecific abnormal finding of lung field: Secondary | ICD-10-CM | POA: Diagnosis not present

## 2023-06-24 DIAGNOSIS — R1012 Left upper quadrant pain: Secondary | ICD-10-CM

## 2023-06-24 DIAGNOSIS — J189 Pneumonia, unspecified organism: Secondary | ICD-10-CM | POA: Diagnosis not present

## 2023-06-24 DIAGNOSIS — R1011 Right upper quadrant pain: Secondary | ICD-10-CM | POA: Diagnosis not present

## 2023-06-24 MED ORDER — SEMAGLUTIDE-WEIGHT MANAGEMENT 1 MG/0.5ML ~~LOC~~ SOAJ: 1.0000 mg | SUBCUTANEOUS | 0 refills | Status: AC

## 2023-06-24 MED ORDER — SEMAGLUTIDE-WEIGHT MANAGEMENT 1.7 MG/0.75ML ~~LOC~~ SOAJ: 1.7000 mg | SUBCUTANEOUS | 0 refills | Status: AC

## 2023-06-24 MED ORDER — SEMAGLUTIDE-WEIGHT MANAGEMENT 0.5 MG/0.5ML ~~LOC~~ SOAJ: 0.5000 mg | SUBCUTANEOUS | 0 refills | Status: AC

## 2023-06-24 NOTE — Progress Notes (Signed)
Established patient visit   Patient: Abigail Garcia   DOB: [birth year]   53 y.o. Female  MRN: [id number] Visit Date: 06/24/2023  Today's healthcare provider: [name], MD   No chief complaint on file.  Subjective       Discussed the use of AI scribe software for clinical note transcription with the patient, who gave verbal consent to proceed.  History of Present Illness   The patient is a 53 year old individual with obesity who has been on Wegovy 0.25 mg weekly. She reports a weight loss of 4 pounds since the last visit on January 7th, with a current weight of 253 pounds, down from 261 pounds in December. The patient has tolerated the [facility] well, with no reported side effects such as nausea, diarrhea, constipation, or throat swelling.  However, the patient has experienced respiratory issues, which she self-diagnosed as bronchitis during the Christmas week. Although the coughing has ceased, she reports persistent soreness in the thoracic region, which is exacerbated by deep breathing. The patient also notes a history of allergies leading to lung discomfort.  The patient is also on Meloxicam (Mobic) for pain management, which she reports helps her sleep at night. 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         Past Medical History:  Diagnosis Date   Anemia    Blood transfusion without reported diagnosis    CAD (coronary artery disease)    a. 11/2012 NSTEMI/Cath/PCI: LM nl, LAD 80-90 thrombotic (tx with Heparin x 2 days then aspiration thrombectomy and PTCA), RI nl, LCX sm/nl, RCA nl, EF 55-65%.   Esophagitis    grade 1   Hx of echocardiogram    a. Echo (6/14) with EF 55-60%.    Menorrhagia    NSTEMI (non-ST elevated myocardial infarction) (HCC)    11/2012    Obesity    RA (rheumatoid arthritis) (HCC)     Medications: Outpatient Medications Prior to Visit  Medication Sig   albuterol (PROVENTIL HFA) 108  (90 Base) MCG/ACT inhaler Inhale 1-2 puffs into the lungs every 6 (six) hours as needed for wheezing or shortness of breath.   aspirin 81 MG EC tablet Take 1 tablet (81 mg total) by mouth daily.   hydroxychloroquine (PLAQUENIL) 200 MG tablet Take 200 mg by mouth 2 (two) times daily.   megestrol (MEGACE) 40 MG tablet TAKE 2 TABLETS BY MOUTH DAILY. CAN INCREASE TO 2 TABLETS TWICE DAILY FOR HEAVY BLEEDING   meloxicam (MOBIC) 15 MG tablet Take 1 tablet (15 mg total) by mouth daily.   methotrexate (RHEUMATREX) 2.5 MG tablet Take 15 mg by mouth once a week. Take 6 tablets(15 mg total) by mouth every 7 days for 90 days   metoprolol tartrate (LOPRESSOR) 25 MG tablet Take 0.5 tablets (12.5 mg total) by mouth 2 (two) times daily.   ezetimibe (ZETIA) 10 MG tablet Take 1 tablet (10 mg total) by mouth daily.   No facility-administered medications prior to visit.    Review of Systems  Last metabolic panel Lab Results  Component Value Date   GLUCOSE 87 10/22/2022   NA 141 10/22/2022   K 4.3 10/22/2022   CL 106 10/22/2022   CO2 20 10/22/2022   BUN 16 10/22/2022   CREATININE 0.89 10/22/2022   EGFR 78 10/22/2022   CALCIUM 10.0 10/22/2022   PROT 7.7 10/22/2022   ALBUMIN 4.0 10/22/2022   LABGLOB 3.7 10/22/2022   AGRATIO 1.1 (L)  10/22/2022   BILITOT 0.3 10/22/2022   ALKPHOS 79 10/22/2022   AST 11 10/22/2022   ALT 10 10/22/2022   ANIONGAP 10 03/14/2019        Objective    BP 114/88 (BP Location: Right Arm, Patient Position: Sitting, Cuff Size: Large)   Pulse 93   Ht 5\' 3"  (1.6 m)   Wt 253 lb (114.8 kg)   SpO2 95%   BMI 44.82 kg/m   BP Readings from Last 3 Encounters:  06/24/23 114/88  06/10/23 133/87  05/20/23 100/78   Wt Readings from Last 3 Encounters:  06/24/23 253 lb (114.8 kg)  06/10/23 257 lb (116.6 kg)  05/20/23 261 lb (118.4 kg)        Physical Exam Vitals reviewed.  Constitutional:      General: She is not in acute distress.    Appearance: Normal appearance. She  is not ill-appearing, toxic-appearing or diaphoretic.  Eyes:     Conjunctiva/sclera: Conjunctivae normal.  Cardiovascular:     Rate and Rhythm: Normal rate and regular rhythm.     Pulses: Normal pulses.     Heart sounds: Normal heart sounds. No murmur heard.    No friction rub. No gallop.  Pulmonary:     Effort: Pulmonary effort is normal. No respiratory distress.     Breath sounds: Normal breath sounds. No stridor. No wheezing, rhonchi or rales.  Abdominal:     General: Bowel sounds are normal. There is no distension.     Palpations: Abdomen is soft.     Tenderness: There is no abdominal tenderness.  Musculoskeletal:       Arms:     Right lower leg: No edema.     Left lower leg: No edema.  Skin:    Findings: No erythema or rash.  Neurological:     Mental Status: She is alert and oriented to person, place, and time.     Physical Exam   VITALS: SaO2 initially 95%, later 97% MEASUREMENTS: WT- 253 pounds CHEST: No wheezing, no crackles.       No results found for any visits on 06/24/23.  Assessment & Plan     Problem List Items Addressed This Visit       Other   Obesity - Primary   53 year old on Wegovy 0.25 mg weekly. Weight decreased from 261 to 253 pounds since December. No adverse effects reported. Plan to titrate [facility]: 0.5 mg weekly for one month, 1 mg weekly for one month, then 1.7 mg weekly for one month. Follow-up in three months to assess progress before increasing to 2.4 mg. Chronic - Increase Wegovy to 0.5 mg weekly for one month - Increase Wegovy to 1 mg weekly for one month - Increase Wegovy to 1.7 mg weekly for one month - Schedule follow-up visit in three months      Relevant Medications   Semaglutide-Weight Management 0.5 MG/0.5ML SOAJ (Start on 07/23/2023)   Semaglutide-Weight Management 1 MG/0.5ML SOAJ (Start on 08/21/2023)   Semaglutide-Weight Management 1.7 MG/0.75ML SOAJ (Start on 09/19/2023)   Other Visit Diagnoses       Pleuritic chest pain        Relevant Orders   DG Chest 2 View   Comprehensive metabolic panel   CBC     LUQ pain       Relevant Orders   Comprehensive metabolic panel   CBC         Chest Pain Reports left-sided thoracic pain post self-diagnosed bronchitis episode around Christmas. Pain  worsens with deep breaths. No cough or fever. Lung exam normal. Differential includes rib/muscle strain, possible spleen involvement. Discussed chest x-ray and CMP/CBC. Patient agrees to both tests. 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 Follow-up testing in two weeks. Discussed potential outcomes and next steps based on colposcopy findings, including biopsy if abnormal cells are detected. - Await follow-up testing results - f/u as scheduled   Pain Management Currently taking meloxicam (Mobic) for pain management, which helps alleviate pain and allows for sleep. - Continue meloxicam as needed  Follow-up - Visit outpatient imaging center for chest x-ray - Complete blood work for CMP and CBC - Schedule follow-up visit in three months for weight management and review of imaging and lab results.      Return in about 3 months (around 09/22/2023) for Weight MGMT.       [name], MD  [facility] [phone] (phone) [phone] (fax)  [facility]

## 2023-06-24 NOTE — Patient Instructions (Signed)
Please report to [facility] located at:  [place], [place]  You do not need an appointment to have xrays completed.   Our office will follow up with  results once available.

## 2023-06-24 NOTE — Assessment & Plan Note (Signed)
53 year old on Wegovy 0.25 mg weekly. Weight decreased from 261 to 253 pounds since December. No adverse effects reported. Plan to titrate [facility]: 0.5 mg weekly for one month, 1 mg weekly for one month, then 1.7 mg weekly for one month. Follow-up in three months to assess progress before increasing to 2.4 mg. Chronic - Increase Wegovy to 0.5 mg weekly for one month - Increase Wegovy to 1 mg weekly for one month - Increase Wegovy to 1.7 mg weekly for one month - Schedule follow-up visit in three months

## 2023-06-25 ENCOUNTER — Encounter: Payer: Self-pay | Admitting: Family Medicine

## 2023-06-25 LAB — CBC
Hematocrit: 41.9 % (ref 34.0–46.6)
Hemoglobin: 13.3 g/dL (ref 11.1–15.9)
MCH: 24.4 pg — ABNORMAL LOW (ref 26.6–33.0)
MCHC: 31.7 g/dL (ref 31.5–35.7)
MCV: 77 fL — ABNORMAL LOW (ref 79–97)
Platelets: 446 10*3/uL (ref 150–450)
RBC: 5.44 x10E6/uL — ABNORMAL HIGH (ref 3.77–5.28)
RDW: 14.7 % (ref 11.7–15.4)
WBC: 8.9 10*3/uL (ref 3.4–10.8)

## 2023-06-25 LAB — COMPREHENSIVE METABOLIC PANEL
ALT: 17 [IU]/L (ref 0–32)
AST: 26 [IU]/L (ref 0–40)
Albumin: 3.7 g/dL — ABNORMAL LOW (ref 3.8–4.9)
Alkaline Phosphatase: 79 [IU]/L (ref 44–121)
BUN/Creatinine Ratio: 16 (ref 9–23)
BUN: 12 mg/dL (ref 6–24)
Bilirubin Total: 0.3 mg/dL (ref 0.0–1.2)
CO2: 21 mmol/L (ref 20–29)
Calcium: 9.9 mg/dL (ref 8.7–10.2)
Chloride: 101 mmol/L (ref 96–106)
Creatinine, Ser: 0.77 mg/dL (ref 0.57–1.00)
Globulin, Total: 4.2 g/dL (ref 1.5–4.5)
Glucose: 102 mg/dL — ABNORMAL HIGH (ref 70–99)
Potassium: 4.2 mmol/L (ref 3.5–5.2)
Sodium: 136 mmol/L (ref 134–144)
Total Protein: 7.9 g/dL (ref 6.0–8.5)
eGFR: 93 mL/min/{1.73_m2} (ref >59)

## 2023-06-26 ENCOUNTER — Encounter: Payer: Self-pay | Admitting: Family Medicine

## 2023-06-26 MED ORDER — CEFPODOXIME PROXETIL 200 MG PO TABS: 200.0000 mg | ORAL_TABLET | Freq: Two times a day (BID) | ORAL | 0 refills | Status: DC

## 2023-06-26 MED ORDER — AZITHROMYCIN 250 MG PO TABS: ORAL_TABLET | ORAL | 0 refills | Status: AC

## 2023-06-26 NOTE — Addendum Note (Signed)
Addended by: Bing Neighbors on: 06/26/2023 04:53 PM   Modules accepted: Orders

## 2023-06-27 ENCOUNTER — Ambulatory Visit: Payer: Self-pay

## 2023-06-27 ENCOUNTER — Telehealth: Payer: Self-pay | Admitting: Family Medicine

## 2023-06-27 ENCOUNTER — Telehealth: Payer: Self-pay

## 2023-06-27 ENCOUNTER — Telehealth: Payer: Medicaid Other

## 2023-06-27 ENCOUNTER — Other Ambulatory Visit: Payer: Self-pay | Admitting: Family Medicine

## 2023-06-27 MED ORDER — CEFDINIR 300 MG PO CAPS: 300.0000 mg | ORAL_CAPSULE | Freq: Two times a day (BID) | ORAL | 0 refills | Status: AC

## 2023-06-27 NOTE — Telephone Encounter (Signed)
Pt was advised her prescription for Varney Baas is not covered by insurance. Pt requesting call back. Pt requested home care advice. See triage note if needed.

## 2023-06-27 NOTE — Telephone Encounter (Signed)
Recieved a fax from covermymeds for  Cefpodoxime Proxetil 200mg  Tablets has been started  key: [id number]

## 2023-06-27 NOTE — Telephone Encounter (Signed)
  Chief Complaint: pt wanting home care advice stated no one told her how to take care of herself with pna. Pt stated Vantin not covered by insurance (this NT sent TE to office.)  Disposition: [] ED /[] Urgent Care (no appt availability in office) / [] Appointment(In office/virtual)/ []  [facility]/ [] Home Care/ [] Refused Recommended Disposition /[]  Mobile Bus/ []  Follow-up with PCP Additional Notes: see care advice that was given.  Reason for Disposition  Health Information question, no triage required and triager able to answer question  Answer Assessment - Initial Assessment Questions 1. REASON FOR CALL or QUESTION: "What is your reason for calling today?" or "How can I best help you?" or "What question do you have that I can help answer?"     Pt stated no one told her home care for pneumonia"they just sent a message that I had it and that's all." Pt frustrated  Protocols used: Information Only Call - No Triage-A-AH

## 2023-06-30 NOTE — Telephone Encounter (Signed)
Recieved a fax from covermymeds for Cefpodoxime Proxetil 200mg  Tablets. Patient is waitng for medication    [id number]

## 2023-06-30 NOTE — Telephone Encounter (Signed)
Cefidinir was sent to pharmacy on 06/27/23 to replace vantin

## 2023-07-01 NOTE — Telephone Encounter (Signed)
Abigail Garcia with [facility] called and is needing a diagnosis code, there was not one on the fax that was sent over. She also stated that on the bottom of the fax it said the medication is not needed. She needs confirmation.   Tried getting nurse at practice on phone but she hung up.

## 2023-07-02 NOTE — Telephone Encounter (Signed)
Doreen: [facility]:  Calling to follow up and verify a replacement medicine was sent for patient and PA not needed.  Verified Cefpodoxime Proxetil 200mg  Tablets was replaced with cefdinir (OMNICEF) 300 MG capsule.

## 2023-07-02 NOTE — Telephone Encounter (Signed)
Cefdinir was prescribed on 06/27/23 to replace cefpodoxime 200mg  due to lack of insurance coverage   No PA needed for cefpodoxime

## 2023-07-02 NOTE — Telephone Encounter (Signed)
Detailed VM left per DPR. CRM created. Ok for [facility] to advise/clarify if patient returns call

## 2023-07-05 DIAGNOSIS — Z419 Encounter for procedure for purposes other than remedying health state, unspecified: Secondary | ICD-10-CM | POA: Diagnosis not present

## 2023-07-09 ENCOUNTER — Other Ambulatory Visit (HOSPITAL_COMMUNITY)
Admission: RE | Admit: 2023-07-09 | Discharge: 2023-07-09 | Disposition: A | Discharge status: Home / Self Care | Payer: Medicaid Other | Source: Ambulatory Visit | Attending: Obstetrics & Gynecology | Admitting: Obstetrics & Gynecology

## 2023-07-09 ENCOUNTER — Ambulatory Visit: Payer: Medicaid Other | Admitting: Obstetrics & Gynecology

## 2023-07-09 ENCOUNTER — Encounter: Payer: Self-pay | Admitting: Obstetrics & Gynecology

## 2023-07-09 VITALS — BP 133/84 | HR 88 | Wt 250.0 lb

## 2023-07-09 DIAGNOSIS — N870 Mild cervical dysplasia: Secondary | ICD-10-CM | POA: Diagnosis not present

## 2023-07-09 DIAGNOSIS — R87610 Atypical squamous cells of undetermined significance on cytologic smear of cervix (ASC-US): Secondary | ICD-10-CM

## 2023-07-09 DIAGNOSIS — R87810 Cervical high risk human papillomavirus (HPV) DNA test positive: Secondary | ICD-10-CM | POA: Diagnosis not present

## 2023-07-09 DIAGNOSIS — Z7689 Persons encountering health services in other specified circumstances: Secondary | ICD-10-CM | POA: Diagnosis not present

## 2023-07-09 DIAGNOSIS — Z3202 Encounter for pregnancy test, result negative: Secondary | ICD-10-CM | POA: Diagnosis not present

## 2023-07-09 LAB — POCT URINE PREGNANCY: Preg Test, Ur: NEGATIVE

## 2023-07-09 NOTE — Patient Instructions (Signed)
 COLPOSCOPY POST-PROCEDURE INSTRUCTIONS  You may take Ibuprofen, Aleve or Tylenol for cramping if needed.  If Monsel's solution was used, you will have a black discharge.  Light bleeding is normal.  If bleeding is heavier than your period, please call.  Put nothing in your vagina until the bleeding or discharge stops (usually 2 or3 days).  We will call you within one week with biopsy results

## 2023-07-09 NOTE — Progress Notes (Signed)
    GYNECOLOGY OFFICE COLPOSCOPY PROCEDURE NOTE  53 y.o. [id number] here for colposcopy for ASCUS with POSITIVE high risk HPV pap smear on 06/10/2023. Discussed role for HPV in cervical dysplasia, need for surveillance.  Patient gave informed written consent, time out was performed.  Placed in lithotomy position. Cervix viewed with speculum and colposcope after application of acetic acid.   Colposcopy adequate? Yes No acetowhite lesions noted, no ectocervical biopsy done.   ECC specimen obtained, labeled and sent to pathology.  Chaperone was present during entire procedure.  Patient was given post procedure instructions.  Will follow up pathology and manage accordingly; patient will be contacted with results and recommendations.  Routine preventative health maintenance measures emphasized.    GLORIS HUGGER, MD, FACOG Obstetrician & Gynecologist, [facility] for [employer], [facility]

## 2023-07-09 NOTE — Progress Notes (Signed)
Colposcopy  Last pap: +HPV and  ASCUS

## 2023-07-10 ENCOUNTER — Encounter: Payer: Self-pay | Admitting: Obstetrics & Gynecology

## 2023-07-10 DIAGNOSIS — N870 Mild cervical dysplasia: Secondary | ICD-10-CM

## 2023-07-10 HISTORY — DX: Mild cervical dysplasia: N87.0

## 2023-07-10 LAB — SURGICAL PATHOLOGY

## 2023-07-18 ENCOUNTER — Ambulatory Visit: Payer: Self-pay

## 2023-07-18 NOTE — Telephone Encounter (Signed)
  Chief Complaint: Back pain left side. Symptoms: above Frequency: during and since having pneumonia Pertinent Negatives: Patient denies fever Disposition: [] ED /[] Urgent Care (no appt availability in office) / [x] Appointment(In office/virtual)/ []  [facility]/ [] Home Care/ [] Refused Recommended Disposition /[] [place] [facility]/ []  Follow-up with PCP Additional Notes: Pts that this pain was present when she was diagnosed with pneumonia. Pneumonia is resolving, however this back pain remains. Pain is constant but gets significantly worse with movement. Pt thinks it may be muscular in nature, but is not certain.  PT took Tylenol last night and pain did get better. Appointment scheduled for Monday at [facility]. Pt will call back if s/s worsen or change.     Summary: Back pain from pneumonia   Pt is calling in because she was diagnosed wit pneumonia and pt says her back pain hasn't gone away. She said she's experiencing sharp pains on the left side when she yawns, exhales, and other activities.         Reason for Disposition  [1] MODERATE back pain (e.g., interferes with normal activities) AND [2] present > 3 days  Answer Assessment - Initial Assessment Questions 1. ONSET: "When did the pain begin?"      Always since the pneumonia 2. LOCATION: "Where does it hurt?" (upper, mid or lower back)     Left side back  3. SEVERITY: "How bad is the pain?"  (e.g., Scale 1-10; mild, moderate, or severe)   - MILD (1-3): Doesn't interfere with normal activities.    - MODERATE (4-7): Interferes with normal activities or awakens from sleep.    - SEVERE (8-10): Excruciating pain, unable to do any normal activities.      Moderate - 7-7.5/10 4. PATTERN: "Is the pain constant?" (e.g., yes, no; constant, intermittent)      Constant, but worse when pt does things 5. RADIATION: "Does the pain shoot into your legs or somewhere else?"     no 6. CAUSE:  "What do you think is causing the back  pain?"      Unsure - shoulder was sore 7. BACK OVERUSE:  "Any recent lifting of heavy objects, strenuous work or exercise?"     no 8. MEDICINES: "What have you taken so far for the pain?" (e.g., nothing, acetaminophen, NSAIDS)     Tylenol - was effective 9. NEUROLOGIC SYMPTOMS: "Do you have any weakness, numbness, or problems with bowel/bladder control?"     no 10. OTHER SYMPTOMS: "Do you have any other symptoms?" (e.g., fever, abdomen pain, burning with urination, blood in urine)       no  Protocols used: Back Pain-A-AH

## 2023-07-21 ENCOUNTER — Encounter: Payer: Self-pay | Admitting: Nurse Practitioner

## 2023-07-21 ENCOUNTER — Ambulatory Visit
Admission: RE | Admit: 2023-07-21 | Discharge: 2023-07-21 | Disposition: A | Discharge status: Home / Self Care | Payer: Medicaid Other | Attending: Nurse Practitioner | Admitting: Nurse Practitioner

## 2023-07-21 ENCOUNTER — Ambulatory Visit
Admission: RE | Admit: 2023-07-21 | Discharge: 2023-07-21 | Disposition: A | Discharge status: Home / Self Care | Payer: Medicaid Other | Source: Ambulatory Visit | Attending: Nurse Practitioner | Admitting: Nurse Practitioner

## 2023-07-21 ENCOUNTER — Ambulatory Visit: Payer: Medicaid Other | Admitting: Nurse Practitioner

## 2023-07-21 VITALS — BP 132/72 | HR 110 | Temp 97.8°F | Resp 18 | Ht 63.0 in | Wt 249.7 lb

## 2023-07-21 DIAGNOSIS — R051 Acute cough: Secondary | ICD-10-CM | POA: Diagnosis not present

## 2023-07-21 DIAGNOSIS — M940 Chondrocostal junction syndrome [Tietze]: Secondary | ICD-10-CM

## 2023-07-21 DIAGNOSIS — R918 Other nonspecific abnormal finding of lung field: Secondary | ICD-10-CM | POA: Diagnosis not present

## 2023-07-21 DIAGNOSIS — J189 Pneumonia, unspecified organism: Secondary | ICD-10-CM | POA: Diagnosis not present

## 2023-07-21 DIAGNOSIS — R9389 Abnormal findings on diagnostic imaging of other specified body structures: Secondary | ICD-10-CM | POA: Diagnosis not present

## 2023-07-21 DIAGNOSIS — M549 Dorsalgia, unspecified: Secondary | ICD-10-CM | POA: Diagnosis not present

## 2023-07-21 DIAGNOSIS — M546 Pain in thoracic spine: Secondary | ICD-10-CM | POA: Diagnosis not present

## 2023-07-21 MED ORDER — SPACER/AERO-HOLD CHAMBER BAGS MISC: 1.0000 | Freq: Every day | 0 refills | Status: DC

## 2023-07-21 MED ORDER — ALBUTEROL SULFATE HFA 108 (90 BASE) MCG/ACT IN AERS: 1.0000 | INHALATION_SPRAY | Freq: Four times a day (QID) | RESPIRATORY_TRACT | 0 refills | Status: DC | PRN

## 2023-07-21 MED ORDER — PREDNISONE 10 MG (21) PO TBPK: ORAL_TABLET | ORAL | 0 refills | Status: DC

## 2023-07-21 NOTE — Progress Notes (Signed)
BP 132/72   Pulse (!) 110   Temp 97.8 F (36.6 C)   Resp 18   Ht 5\' 3"  (1.6 m)   Wt 249 lb 11.2 oz (113.3 kg)   SpO2 95%   BMI 44.23 kg/m    Subjective:    Patient ID: Abigail Garcia, female    DOB: 1970-07-11, 53 y.o.   MRN: [id number]  HPI: Abigail Garcia is a 53 y.o. female  Chief Complaint  Patient presents with   Back Pain    Side pain left onset 1-2weeks since having pneumonia.  Hurts when coughing, sneezing or taking deep breaths    Discussed the use of AI scribe software for clinical note transcription with the patient, who gave verbal consent to proceed.  History of Present Illness   The patient, with a history of rheumatoid arthritis, presents with persistent left thoracic back pain following a recent pneumonia diagnosis. The pain, which was not present prior to the pneumonia, has persisted for a couple of weeks despite completion of antibiotics. She denies any trauma or injury prior to the onset of the pain. The patient also reports shortness of breath and feeling winded, particularly when walking up stairs. She has been managing the pain with over-the-counter medications such as Tylenol and ibuprofen. The patient also mentions a 'spongy' feeling when breathing at night. She has a history of rheumatoid arthritis, which she believes may be contributing to the lingering pain.       06/24/2023    9:45 AM 10/22/2022    1:57 PM 05/03/2021   11:21 AM  Depression screen PHQ 2/9  Decreased Interest 0 1 0  Down, Depressed, Hopeless 0 1 0  PHQ - 2 Score 0 2 0  Altered sleeping 0 1 0  Tired, decreased energy 0 0 0  Change in appetite 0 0 0  Feeling bad or failure about yourself  0 1 0  Trouble concentrating 0 0 0  Moving slowly or fidgety/restless 0 0 0  Suicidal thoughts 0 0 0  PHQ-9 Score 0 4 0  Difficult doing work/chores Not difficult at all Not difficult at all Not difficult at all    Relevant past medical, surgical, family and social history reviewed and updated as  indicated. Interim medical history since our last visit reviewed. Allergies and medications reviewed and updated.  Review of Systems  Ten systems reviewed and is negative except as mentioned in HPI      Objective:    BP 132/72   Pulse (!) 110   Temp 97.8 F (36.6 C)   Resp 18   Ht 5\' 3"  (1.6 m)   Wt 249 lb 11.2 oz (113.3 kg)   SpO2 95%   BMI 44.23 kg/m    Wt Readings from Last 3 Encounters:  07/21/23 249 lb 11.2 oz (113.3 kg)  07/09/23 250 lb (113.4 kg)  06/24/23 253 lb (114.8 kg)    Physical Exam Vitals reviewed.  Constitutional:      Appearance: Normal appearance.  HENT:     Head: Normocephalic.  Cardiovascular:     Rate and Rhythm: Normal rate and regular rhythm.  Pulmonary:     Effort: Pulmonary effort is normal.     Breath sounds: Normal breath sounds.  Musculoskeletal:        General: Normal range of motion.  Skin:    General: Skin is warm and dry.  Neurological:     General: No focal deficit present.     Mental Status: She  is alert and oriented to person, place, and time. Mental status is at baseline.  Psychiatric:        Mood and Affect: Mood normal.        Behavior: Behavior normal.        Thought Content: Thought content normal.        Judgment: Judgment normal.     Results for orders placed or performed in visit on 07/09/23  POCT urine pregnancy   Collection Time: 07/09/23 10:44 AM  Result Value Ref Range   Preg Test, Ur Negative Negative  Surgical pathology   Collection Time: 07/09/23 10:44 AM  Result Value Ref Range   SURGICAL PATHOLOGY      SURGICAL PATHOLOGY CASE: [id number] PATIENT: Abigail Garcia Surgical Pathology Report     Clinical History: ASCUS with positive high risk HPV on pap (cf)     FINAL MICROSCOPIC DIAGNOSIS:  A. ENDOCERVIX, CURETTAGE: Low-grade squamous intraepithelial lesion, CIN-1. Benign endocervical mucosa.   GROSS DESCRIPTION:  Received in formalin is blood tinged mucus that is entirely submitted  in one block.  Volume: 1.7 x 1.2 x 0.0 cm (1 B) (KW, 07/09/2023)  Final Diagnosis performed by Jimmy Picket, MD.   Electronically signed 07/10/2023 Technical and / or Professional components performed at [facility]. [facility], [address], [place], [place] [address].  Immunohistochemistry Technical component (if applicable) was performed at [facility]. [address], [place], [place] [address].   IMMUNOHISTOCHEMISTRY DISCLAIMER (if applicable): Some of these immunohistochemical stains may have been developed and the performance characteris tics determine by [facility]. Some may not have been cleared or approved by the U.S. Food and Drug Administration. The FDA has determined that such clearance or approval is not necessary. This test is used for clinical purposes. It should not be regarded as investigational or for research. This laboratory is certified under the Clinical Laboratory Improvement Amendments of 1988 (CLIA-88) as qualified to perform high complexity clinical laboratory testing.  The controls stained appropriately.   IHC stains are performed on formalin fixed, paraffin embedded tissue using a 3,3"diaminobenzidine (DAB) chromogen and Leica Bond Autostainer System. The staining intensity of the nucleus is score manually and is reported as the percentage of tumor cell nuclei demonstrating specific nuclear staining. The specimens are fixed in 10% Neutral Formalin for at least 6 hours and up to 72hrs. These tests are validated on decalcified tissue. Results should be interpreted with ca ution given the possibility of false negative results on decalcified specimens. Antibody Clones are as follows ER-clone 110F, PR-clone 16, Ki67- clone MM1. Some of these immunohistochemical stains may have been developed and the performance characteristics determined by [facility].        Assessment & Plan:   Problem List Items  Addressed This Visit   None Visit Diagnoses       Acute left-sided thoracic back pain    -  Primary   Relevant Medications   predniSONE (STERAPRED UNI-PAK 21 TAB) 10 MG (21) TBPK tablet   Other Relevant Orders   DG Chest 2 View     Costochondritis       Relevant Medications   predniSONE (STERAPRED UNI-PAK 21 TAB) 10 MG (21) TBPK tablet   Spacer/Aero-Hold Chamber Bags MISC   Spacer/Aero-Hold Chamber Bags MISC   Other Relevant Orders   DG Chest 2 View     Acute cough       Relevant Medications   albuterol (PROVENTIL HFA) 108 (90 Base)  MCG/ACT inhaler   Spacer/Aero-Hold Chamber Bags MISC   Spacer/Aero-Hold Chamber Bags MISC        Assessment and Plan    Pneumonia/ costochondritis  Recent diagnosis with persistent chest pain and dyspnea. Completed antibiotics and steroids. No cough. No trauma. -Order chest X-ray to assess pneumonia resolution. -Start Prednisone, to be sent to [facility].  Rheumatoid Arthritis Chronic condition, currently managed with Meloxicam. Patient has been self-medicating with over-the-counter ibuprofen for pain. -Advise patient to take 650mg  of Tylenol and 400-600mg  of ibuprofen together every six hours for pain management until steroids arrive. -Continue Meloxicam as prescribed.  General Health Maintenance -Advise patient to obtain a spirometer from the pharmacy for lung exercises.        Follow up plan: Return if symptoms worsen or fail to improve.

## 2023-07-28 ENCOUNTER — Encounter: Payer: Self-pay | Admitting: Pediatrics

## 2023-07-30 ENCOUNTER — Ambulatory Visit (AMBULATORY_SURGERY_CENTER): Payer: Medicaid Other

## 2023-07-30 VITALS — Ht 63.0 in | Wt 245.0 lb

## 2023-07-30 DIAGNOSIS — D509 Iron deficiency anemia, unspecified: Secondary | ICD-10-CM

## 2023-07-30 MED ORDER — SUFLAVE 178.7 G PO SOLR
1.0000 | Freq: Once | ORAL | 0 refills | Status: AC
Start: 2023-07-30 — End: 2023-07-30

## 2023-07-30 NOTE — Progress Notes (Signed)

## 2023-08-02 DIAGNOSIS — Z419 Encounter for procedure for purposes other than remedying health state, unspecified: Secondary | ICD-10-CM | POA: Diagnosis not present

## 2023-08-04 ENCOUNTER — Encounter: Payer: Self-pay | Admitting: Nurse Practitioner

## 2023-08-06 DIAGNOSIS — L538 Other specified erythematous conditions: Secondary | ICD-10-CM | POA: Diagnosis not present

## 2023-08-06 DIAGNOSIS — L309 Dermatitis, unspecified: Secondary | ICD-10-CM | POA: Diagnosis not present

## 2023-08-06 DIAGNOSIS — R208 Other disturbances of skin sensation: Secondary | ICD-10-CM | POA: Diagnosis not present

## 2023-08-06 DIAGNOSIS — Z7689 Persons encountering health services in other specified circumstances: Secondary | ICD-10-CM | POA: Diagnosis not present

## 2023-08-06 DIAGNOSIS — L720 Epidermal cyst: Secondary | ICD-10-CM | POA: Diagnosis not present

## 2023-08-08 DIAGNOSIS — Z79899 Other long term (current) drug therapy: Secondary | ICD-10-CM | POA: Diagnosis not present

## 2023-08-08 DIAGNOSIS — Z7689 Persons encountering health services in other specified circumstances: Secondary | ICD-10-CM | POA: Diagnosis not present

## 2023-08-08 DIAGNOSIS — E559 Vitamin D deficiency, unspecified: Secondary | ICD-10-CM | POA: Diagnosis not present

## 2023-08-08 DIAGNOSIS — M0579 Rheumatoid arthritis with rheumatoid factor of multiple sites without organ or systems involvement: Secondary | ICD-10-CM | POA: Diagnosis not present

## 2023-08-12 NOTE — Progress Notes (Unsigned)
 [facility] History and Physical   Primary Care Physician:  [name], MD   Reason for Procedure:  Colon cancer screening  Plan:    Screening colonoscopy   HPI: Abigail Garcia is a 53 y.o. female undergoing screening colonoscopy for colon cancer screening.  Last colonoscopy was performed in 2016 for an indication of anemia and was normal.  Recommended to have a follow-up colonoscopy at age 55 which has not yet been completed.  No family history of colorectal cancer or polyps.  Denies current symptoms of change in bowel habits or rectal bleeding.   Past Medical History:  Diagnosis Date   Anemia    Blood transfusion without reported diagnosis    CAD (coronary artery disease)    a. 11/2012 NSTEMI/Cath/PCI: LM nl, LAD 80-90 thrombotic (tx with Heparin x 2 days then aspiration thrombectomy and PTCA), RI nl, LCX sm/nl, RCA nl, EF 55-65%.   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 Echo (6/14) with EF 55-60%.    Hypertension    Menorrhagia    NSTEMI (non-ST elevated myocardial infarction) (HCC)    11/2012    Obesity    RA (rheumatoid arthritis) (HCC)     Past Surgical History:  Procedure Laterality Date   bilateral tubal     CARDIAC CATHETERIZATION  2014   CESAREAN SECTION     CORONARY ANGIOGRAM  11/19/2012   Procedure: CORONARY ANGIOGRAM;  Surgeon: Peter M Swaziland, MD;  Location: [facility] CATH LAB;  Service: Cardiovascular;;   DILATION AND CURETTAGE OF UTERUS     DILATION AND CURETTAGE OF UTERUS N/A 03/13/2019   Procedure: DILATATION AND CURETTAGE;  Surgeon: Allie Bossier, MD;  Location: [facility] OR;  Service: Gynecology;  Laterality: N/A;   INTRAVASCULAR ULTRASOUND  11/19/2012   Procedure: INTRAVASCULAR ULTRASOUND;  Surgeon: Peter M Swaziland, MD;  Location: [facility] C[facility] LAB;  Service: Cardiovascular;;   LEFT HEART CATHETERIZATION WITH CORONARY ANGIOGRAM N/A 11/16/2012    Procedure: LEFT HEART CATHETERIZATION WITH CORONARY ANGIOGRAM;  Surgeon: Peter M Swaziland, MD;  Location: [facility] CATH LAB;  Service: Cardiovascular;  Laterality: N/A;   PERCUTANEOUS CORONARY INTERVENTION-BALLOON ONLY  11/19/2012   Procedure: PERCUTANEOUS CORONARY INTERVENTION-BALLOON ONLY;  Surgeon: Peter M Swaziland, MD;  Location: [facility] CATH LAB;  Service: Cardiovascular;;    Prior to Admission medications   Medication Sig Start Date End Date Taking? Authorizing Provider  albuterol (PROVENTIL HFA) 108 (90 Base) MCG/ACT inhaler Inhale 1-2 puffs into the lungs every 6 (six) hours as needed for wheezing or shortness of breath. 07/21/23   Berniece Salines, FNP  aspirin 81 MG EC tablet Take 1 tablet (81 mg total) by mouth daily. 03/18/19   Masoudi, Shawna Orleans, MD  ezetimibe (ZETIA) 10 MG tablet Take 1 tablet (10 mg total) by mouth daily. 11/13/22 07/30/23  Antonieta Iba, MD  hydroxychloroquine (PLAQUENIL) 200 MG tablet Take 200 mg by mouth 2 (two) times daily. Patient not taking: Reported on 07/30/2023 07/15/22   [provider]  megestrol (MEGACE) 40 MG tablet TAKE 2 TABLETS BY MOUTH DAILY. CAN INCREASE TO 2 TABLETS TWICE DAILY FOR HEAVY BLEEDING 10/07/22   Anyanwu, Jethro Bastos, MD  meloxicam (MOBIC) 15 MG tablet Take 1 tablet (15 mg total) by mouth daily. 04/08/23   Simmons-Robinson, Makiera, MD  methotrexate (RHEUMATREX) 2.5 MG tablet Take 15 mg by mouth once a  week. Take 6 tablets(15 mg total) by mouth every 7 days for 90 days Patient not taking: Reported on 07/30/2023 07/15/22   [provider]  metoprolol tartrate (LOPRESSOR) 25 MG tablet Take 0.5 tablets (12.5 mg total) by mouth 2 (two) times daily. 07/15/22   Willette Cluster, MD  predniSONE (STERAPRED UNI-PAK 21 TAB) 10 MG (21) TBPK tablet Take as directed on package.  (60 mg po on day 1, 50 mg po on day 2...) 07/21/23   Berniece Salines, FNP  Semaglutide-Weight Management 0.5 MG/0.5ML SOAJ Inject 0.5 mg into the skin once a week for 28 days.  07/23/23 08/20/23  Simmons-Robinson, Tawanna Cooler, MD  Semaglutide-Weight Management 1 MG/0.5ML SOAJ Inject 1 mg into the skin once a week for 28 days. Patient not taking: Reported on 07/30/2023 08/21/23 09/18/23  Simmons-Robinson, Tawanna Cooler, MD  Semaglutide-Weight Management 1.7 MG/0.75ML SOAJ Inject 1.7 mg into the skin once a week for 28 days. Patient not taking: Reported on 07/30/2023 09/19/23 10/17/23  [name], MD  Spacer/Aero-Hold Chamber Bags MISC 1 each by Does not apply route daily. Patient not taking: Reported on 07/30/2023 07/21/23   Berniece Salines, FNP  Spacer/Aero-Hold Chamber Bags MISC 1 each by Does not apply route daily. Patient not taking: Reported on 07/30/2023 07/21/23   Berniece Salines, FNP    Current Outpatient Medications  Medication Sig Dispense Refill   albuterol (PROVENTIL HFA) 108 (90 Base) MCG/ACT inhaler Inhale 1-2 puffs into the lungs every 6 (six) hours as needed for wheezing or shortness of breath. 8 g 0   aspirin 81 MG EC tablet Take 1 tablet (81 mg total) by mouth daily. 30 tablet 0   ezetimibe (ZETIA) 10 MG tablet Take 1 tablet (10 mg total) by mouth daily. 90 tablet 3   hydroxychloroquine (PLAQUENIL) 200 MG tablet Take 200 mg by mouth 2 (two) times daily. (Patient not taking: Reported on 07/30/2023)     megestrol (MEGACE) 40 MG tablet TAKE 2 TABLETS BY MOUTH DAILY. CAN INCREASE TO 2 TABLETS TWICE DAILY FOR HEAVY BLEEDING 60 tablet 5   meloxicam (MOBIC) 15 MG tablet Take 1 tablet (15 mg total) by mouth daily. 90 tablet 2   methotrexate (RHEUMATREX) 2.5 MG tablet Take 15 mg by mouth once a week. Take 6 tablets(15 mg total) by mouth every 7 days for 90 days (Patient not taking: Reported on 07/30/2023)     metoprolol tartrate (LOPRESSOR) 25 MG tablet Take 0.5 tablets (12.5 mg total) by mouth 2 (two) times daily. 180 tablet 1   predniSONE (STERAPRED UNI-PAK 21 TAB) 10 MG (21) TBPK tablet Take as directed on package.  (60 mg po on day 1, 50 mg po on day 2...) 21 tablet  0   Semaglutide-Weight Management 0.5 MG/0.5ML SOAJ Inject 0.5 mg into the skin once a week for 28 days. 2 mL 0   [START ON 08/21/2023] Semaglutide-Weight Management 1 MG/0.5ML SOAJ Inject 1 mg into the skin once a week for 28 days. (Patient not taking: Reported on 07/30/2023) 2 mL 0   [START ON 09/19/2023] Semaglutide-Weight Management 1.7 MG/0.75ML SOAJ Inject 1.7 mg into the skin once a week for 28 days. (Patient not taking: Reported on 07/30/2023) 3 mL 0   Spacer/Aero-Hold Chamber Bags MISC 1 each by Does not apply route daily. (Patient not taking: Reported on 07/30/2023) 1 each 0   Spacer/Aero-Hold Chamber Bags MISC 1 each by Does not apply route daily. (Patient not taking: Reported on 07/30/2023) 1 each 0   No current  facility-administered medications for this visit.    Allergies as of 08/13/2023   (No Known Allergies)    Family History  Problem Relation Age of Onset   Breast cancer Paternal Grandmother    Heart disease Other    Colon cancer Neg Hx    Rectal cancer Neg Hx    Stomach cancer Neg Hx    Esophageal cancer Neg Hx     Social History   Socioeconomic History   Marital status: Single    Spouse name: Not on file   Number of children: Not on file   Years of education: Not on file   Highest education level: Not on file  Occupational History   Not on file  Tobacco Use   Smoking status: Never   Smokeless tobacco: Never  Vaping Use   Vaping status: Never Used  Substance and Sexual Activity   Alcohol use: No   Drug use: No   Sexual activity: Yes    Birth control/protection: Surgical    Comment: BTL  Other Topics Concern   Not on file  Social History Narrative   Not on file   Social Drivers of Health   Financial Resource Strain: Patient Declined (07/20/2023)   Overall Financial Resource Strain (CARDIA)    Difficulty of Paying Living Expenses: Patient declined  Food Insecurity: Patient Declined (07/20/2023)   Hunger Vital Sign    Worried About Running Out of Food  in the Last Year: Patient declined    Ran Out of Food in the Last Year: Patient declined  Transportation Needs: Unmet Transportation Needs (07/20/2023)   PRAPARE - Transportation    Lack of Transportation (Medical): Yes    Lack of Transportation (Non-Medical): Yes  Physical Activity: Unknown (07/20/2023)   Exercise Vital Sign    Days of Exercise per Week: 0 days    Minutes of Exercise per Session: Not on file  Stress: No Stress Concern Present (07/20/2023)   [employer] of Occupational Health - Occupational Stress Questionnaire    Feeling of Stress : Only a little  Social Connections: Unknown (07/20/2023)   Social Connection and Isolation Panel [NHANES]    Frequency of Communication with Friends and Family: More than three times a week    Frequency of Social Gatherings with Friends and Family: Patient declined    Attends Religious Services: Patient declined    Database administrator or Organizations: Patient declined    Attends Engineer, structural: Not on file    Marital Status: Patient declined  Intimate Partner Violence: Not on file    Review of Systems:  All other review of systems negative except as mentioned in the HPI.  Physical Exam: Vital signs There were no vitals taken for this visit.  General:   Alert,  Well-developed, well-nourished, pleasant and cooperative in NAD Airway:  Mallampati  Lungs:  Clear throughout to auscultation.   Heart:  Regular rate and rhythm; no murmurs, clicks, rubs,  or gallops. Abdomen:  Soft, nontender and nondistended. Normal bowel sounds.   Neuro/Psych:  Normal mood and affect. A and O x 3  Maren Beach, MD [facility]

## 2023-08-13 ENCOUNTER — Ambulatory Visit: Admitting: Pediatrics

## 2023-08-13 ENCOUNTER — Encounter: Payer: Self-pay | Admitting: Pediatrics

## 2023-08-13 ENCOUNTER — Encounter: Payer: Medicaid Other | Admitting: Pediatrics

## 2023-08-13 VITALS — BP 111/81 | HR 83 | Temp 97.7°F | Resp 14 | Ht 63.0 in | Wt 245.0 lb

## 2023-08-13 DIAGNOSIS — D125 Benign neoplasm of sigmoid colon: Secondary | ICD-10-CM | POA: Diagnosis not present

## 2023-08-13 DIAGNOSIS — D509 Iron deficiency anemia, unspecified: Secondary | ICD-10-CM | POA: Diagnosis not present

## 2023-08-13 DIAGNOSIS — Z1211 Encounter for screening for malignant neoplasm of colon: Secondary | ICD-10-CM | POA: Diagnosis not present

## 2023-08-13 DIAGNOSIS — K635 Polyp of colon: Secondary | ICD-10-CM

## 2023-08-13 DIAGNOSIS — K648 Other hemorrhoids: Secondary | ICD-10-CM

## 2023-08-13 DIAGNOSIS — Z7689 Persons encountering health services in other specified circumstances: Secondary | ICD-10-CM | POA: Diagnosis not present

## 2023-08-13 DIAGNOSIS — I10 Essential (primary) hypertension: Secondary | ICD-10-CM | POA: Diagnosis not present

## 2023-08-13 DIAGNOSIS — I2510 Atherosclerotic heart disease of native coronary artery without angina pectoris: Secondary | ICD-10-CM | POA: Diagnosis not present

## 2023-08-13 DIAGNOSIS — E669 Obesity, unspecified: Secondary | ICD-10-CM | POA: Diagnosis not present

## 2023-08-13 MED ORDER — SODIUM CHLORIDE 0.9 % IV SOLN: 500.0000 mL | Freq: Once | INTRAVENOUS | Status: DC

## 2023-08-13 NOTE — Progress Notes (Signed)
 Pt's states no medical or surgical changes since previsit or office visit.

## 2023-08-13 NOTE — Patient Instructions (Addendum)
-

## 2023-08-13 NOTE — Op Note (Signed)
 [facility] Patient Name: Abigail Garcia Procedure Date: 08/13/2023 11:15 AM MRN: [id number] Endoscopist: Maren Beach , MD, [id number] Age: 53 Referring MD:  Date of Birth: February 21, 1971 Gender: Female Account #: [id number] Procedure:                Colonoscopy Indications:              Screening for colorectal malignant neoplasm, Last                            colonoscopy: 2018 Medicines:                Monitored Anesthesia Care Procedure:                Pre-Anesthesia Assessment:                           - Prior to the procedure, a History and Physical                            was performed, and patient medications and                            allergies were reviewed. The patient's tolerance of                            previous anesthesia was also reviewed. The risks                            and benefits of the procedure and the sedation                            options and risks were discussed with the patient.                            All questions were answered, and informed consent                            was obtained. Prior Anticoagulants: The patient has                            taken no anticoagulant or antiplatelet agents. ASA                            Grade Assessment: III - A patient with severe                            systemic disease. After reviewing the risks and                            benefits, the patient was deemed in satisfactory                            condition to undergo the procedure.  After obtaining informed consent, the colonoscope                            was passed under direct vision. Throughout the                            procedure, the patient's blood pressure, pulse, and                            oxygen saturations were monitored continuously. The                            Olympus CF-HQ190L ([id number]) Colonoscope was                            introduced through the anus and advanced to  the                            cecum, identified by appendiceal orifice and                            ileocecal valve. The colonoscopy was performed                            without difficulty. The patient tolerated the                            procedure well. The quality of the bowel                            preparation was good. The ileocecal valve,                            appendiceal orifice, and rectum were photographed. Scope In: 11:31:08 AM Scope Out: 11:44:05 AM Scope Withdrawal Time: 0 hours 8 minutes 43 seconds  Total Procedure Duration: 0 hours 12 minutes 57 seconds  Findings:                 The perianal and digital rectal examinations were                            normal. Pertinent negatives include normal                            sphincter tone and no palpable rectal lesions.                           A 3 mm polyp was found in the sigmoid colon. The                            polyp was sessile. The polyp was removed with a                            cold biopsy forceps. Resection and retrieval were  complete.                           Internal hemorrhoids were found during retroflexion. Complications:            No immediate complications. Estimated blood loss:                            Minimal. Estimated Blood Loss:     Estimated blood loss was minimal. Impression:               - One 3 mm polyp in the sigmoid colon, removed with                            a cold biopsy forceps. Resected and retrieved.                           - Internal hemorrhoids. Recommendation:           - Discharge patient to home (ambulatory).                           - Await pathology results.                           - Repeat colonoscopy for surveillance based on                            pathology results.                           - The findings and recommendations were discussed                            with the patient's family.                            - Return to referring physician.                           - Patient has a contact number available for                            emergencies. The signs and symptoms of potential                            delayed complications were discussed with the                            patient. Return to normal activities tomorrow.                            Written discharge instructions were provided to the                            patient. Maren Beach, MD 08/13/2023 11:47:44 AM This report has been signed electronically.

## 2023-08-13 NOTE — Progress Notes (Signed)
 Sedate, gd SR, tolerated procedure well, VSS, report to RN

## 2023-08-13 NOTE — Progress Notes (Signed)
 Called to room to assist during endoscopic procedure.  Patient ID and intended procedure confirmed with present staff. Received instructions for my participation in the procedure from the performing physician.

## 2023-08-14 ENCOUNTER — Telehealth: Payer: Self-pay

## 2023-08-14 NOTE — Telephone Encounter (Signed)
  Follow up Call-     08/13/2023   10:49 AM  Call back number  Post procedure Call Back phone  # [phone]  Permission to leave phone message Yes     Patient questions:  Do you have a fever, pain , or abdominal swelling? No. Pain Score  0 *  Have you tolerated food without any problems? Yes.    Have you been able to return to your normal activities? Yes.    Do you have any questions about your discharge instructions: Diet   No. Medications  No. Follow up visit  No.  Do you have questions or concerns about your Care? No.  Actions: * If pain score is 4 or above: No action needed, pain <4.

## 2023-08-15 LAB — SURGICAL PATHOLOGY

## 2023-08-18 ENCOUNTER — Encounter: Payer: Self-pay | Admitting: Pediatrics

## 2023-08-20 DIAGNOSIS — Z7689 Persons encountering health services in other specified circumstances: Secondary | ICD-10-CM | POA: Diagnosis not present

## 2023-08-21 ENCOUNTER — Other Ambulatory Visit: Payer: Self-pay

## 2023-08-21 ENCOUNTER — Telehealth: Admitting: Physician Assistant

## 2023-08-21 ENCOUNTER — Ambulatory Visit
Admission: EM | Admit: 2023-08-21 | Discharge: 2023-08-21 | Disposition: A | Discharge status: Home / Self Care | Attending: Emergency Medicine | Admitting: Emergency Medicine

## 2023-08-21 ENCOUNTER — Encounter: Payer: Self-pay | Admitting: Emergency Medicine

## 2023-08-21 DIAGNOSIS — L0231 Cutaneous abscess of buttock: Secondary | ICD-10-CM | POA: Diagnosis not present

## 2023-08-21 DIAGNOSIS — T819XXA Unspecified complication of procedure, initial encounter: Secondary | ICD-10-CM

## 2023-08-21 MED ORDER — DOXYCYCLINE HYCLATE 100 MG PO CAPS: 100.0000 mg | ORAL_CAPSULE | Freq: Two times a day (BID) | ORAL | 0 refills | Status: DC

## 2023-08-21 MED ORDER — ACETAMINOPHEN-CODEINE 300-30 MG PO TABS: 1.0000 | ORAL_TABLET | Freq: Four times a day (QID) | ORAL | 0 refills | Status: DC | PRN

## 2023-08-21 NOTE — ED Provider Notes (Signed)
 Abigail Garcia    CSN: [id number] Arrival date & time: 08/21/23  1201      History   Chief Complaint Chief Complaint  Patient presents with   Abscess    HPI Abigail Garcia is a 53 y.o. female.   Patient presents for evaluation for a " boil to the perineum" beginning 7 days ago.  Endorses she had a colonoscopy 1 week ago and after began to experience rectal irritation, thought to be related until symptoms progressively worsen.  Has noticed swelling and inflammation to the site of concern but denies drainage.  Has been cleansing area daily.  Denies fever.  History of cyst .   Past Medical History:  Diagnosis Date   Anemia    Blood transfusion without reported diagnosis    CAD (coronary artery disease)    a. 11/2012 NSTEMI/Cath/PCI: LM nl, LAD 80-90 thrombotic (tx with Heparin x 2 days then aspiration thrombectomy and PTCA), RI nl, LCX sm/nl, RCA nl, EF 55-65%.   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 Echo (6/14) with EF 55-60%.    Hypertension    Menorrhagia    NSTEMI (non-ST elevated myocardial infarction) (HCC)    11/2012    Obesity    RA (rheumatoid arthritis) (HCC)     Patient Active Problem List   Diagnosis Date Noted   Dysplasia of cervix, low grade (CIN 1) 07/10/2023   ASCUS with positive high risk HPV on pap 06/10/2023 06/13/2023   Pressure in head 04/08/2023   COVID-19 vaccine series declined 10/22/2022   Dysfunctional uterine bleeding 10/22/2022   Epidermal inclusion cyst 10/22/2022   Healthcare maintenance 10/22/2022   Epidermal cyst 04/24/2021   Symptomatic anemia 03/12/2019   Hirsutism 04/17/2017   Prediabetes 08/18/2015   Blurry vision, bilateral 08/18/2015   Abnormal uterine bleeding (AUB) 08/11/2015   Obesity 11/11/2013   Thrombocytosis 08/31/2013   Ganglion cyst of wrist 06/12/2013   Hyperlipidemia 12/03/2012   CAD S/P percutaneous  coronary angioplasty 11/30/2012   Rheumatoid arthritis involving multiple sites with positive rheumatoid factor (HCC) 11/14/2012   Anemia, iron deficiency 11/14/2012    Past Surgical History:  Procedure Laterality Date   bilateral tubal     CARDIAC CATHETERIZATION  2014   CESAREAN SECTION     CORONARY ANGIOGRAM  11/19/2012   Procedure: CORONARY ANGIOGRAM;  Surgeon: Peter M Swaziland, MD;  Location: [facility] CATH LAB;  Service: Cardiovascular;;   DILATION AND CURETTAGE OF UTERUS     DILATION AND CURETTAGE OF UTERUS N/A 03/13/2019   Procedure: DILATATION AND CURETTAGE;  Surgeon: Allie Bossier, MD;  Location: [facility] OR;  Service: Gynecology;  Laterality: N/A;   INTRAVASCULAR ULTRASOUND  11/19/2012   Procedure: INTRAVASCULAR ULTRASOUND;  Surgeon: Peter M Swaziland, MD;  Location: [facility] CATH LAB;  Service: Cardiovascular;;   LEFT HEART CATHETERIZATION WITH CORONARY ANGIOGRAM N/A 11/16/2012   Procedure: LEFT HEART CATHETERIZATION WITH CORONARY ANGIOGRAM;  Surgeon: Peter M Swaziland, MD;  Location: [facility] CATH LAB;  Service: Cardiovascular;  Laterality: N/A;   PERCUTANEOUS CORONARY INTERVENTION-BALLOON ONLY  11/19/2012   Procedure: PERCUTANEOUS CORONARY INTERVENTION-BALLOON ONLY;  Surgeon: Peter M Swaziland, MD;  Location: [facility] CATH LAB;  Service: Cardiovascular;;    OB History     Gravida  10   Para  4   Term  2   Preterm  2  AB  4   Living  4      SAB  4   IAB  0   Ectopic  0   Multiple  1   Live Births  3            Home Medications    Prior to Admission medications   Medication Sig Start Date End Date Taking? Authorizing Provider  acetaminophen-codeine (TYLENOL #3) 300-30 MG tablet Take 1 tablet by mouth every 6 (six) hours as needed for moderate pain (pain score 4-6). 08/21/23  Yes Connelly Spruell, Elita Boone, NP  doxycycline (VIBRAMYCIN) 100 MG capsule Take 1 capsule (100 mg total) by mouth 2 (two) times daily. 08/21/23  Yes Dynisha Due R, NP  albuterol (PROVENTIL HFA) 108 (90 Base) MCG/ACT inhaler  Inhale 1-2 puffs into the lungs every 6 (six) hours as needed for wheezing or shortness of breath. 07/21/23   Berniece Salines, FNP  aspirin 81 MG EC tablet Take 1 tablet (81 mg total) by mouth daily. 03/18/19   Masoudi, Shawna Orleans, MD  ezetimibe (ZETIA) 10 MG tablet Take 1 tablet (10 mg total) by mouth daily. 11/13/22 07/30/23  Antonieta Iba, MD  hydroxychloroquine (PLAQUENIL) 200 MG tablet Take 200 mg by mouth 2 (two) times daily. Patient not taking: Reported on 07/30/2023 07/15/22   [provider]  megestrol (MEGACE) 40 MG tablet TAKE 2 TABLETS BY MOUTH DAILY. CAN INCREASE TO 2 TABLETS TWICE DAILY FOR HEAVY BLEEDING 10/07/22   Anyanwu, Jethro Bastos, MD  meloxicam (MOBIC) 15 MG tablet Take 1 tablet (15 mg total) by mouth daily. 04/08/23   Simmons-Robinson, Makiera, MD  methotrexate (RHEUMATREX) 2.5 MG tablet Take 15 mg by mouth once a week. Take 6 tablets(15 mg total) by mouth every 7 days for 90 days Patient not taking: Reported on 07/30/2023 07/15/22   [provider]  metoprolol tartrate (LOPRESSOR) 25 MG tablet Take 0.5 tablets (12.5 mg total) by mouth 2 (two) times daily. 07/15/22   Willette Cluster, MD  predniSONE (STERAPRED UNI-PAK 21 TAB) 10 MG (21) TBPK tablet Take as directed on package.  (60 mg po on day 1, 50 mg po on day 2...) 07/21/23   Berniece Salines, FNP  Semaglutide-Weight Management 1 MG/0.5ML SOAJ Inject 1 mg into the skin once a week for 28 days. Patient not taking: Reported on 07/30/2023 08/21/23 09/18/23  Simmons-Robinson, Tawanna Cooler, MD  Semaglutide-Weight Management 1.7 MG/0.75ML SOAJ Inject 1.7 mg into the skin once a week for 28 days. Patient not taking: Reported on 07/30/2023 09/19/23 10/17/23  [name], MD  Spacer/Aero-Hold Chamber Bags MISC 1 each by Does not apply route daily. Patient not taking: Reported on 07/30/2023 07/21/23   Berniece Salines, FNP  Spacer/Aero-Hold Chamber Bags MISC 1 each by Does not apply route daily. Patient not taking: Reported on  08/13/2023 07/21/23   Berniece Salines, FNP  Vitamin D, Ergocalciferol, (DRISDOL) 1.25 MG (50000 UNIT) CAPS capsule Take 50,000 Units by mouth once a week. Patient not taking: Reported on 08/13/2023 08/12/23   [provider]    Family History Family History  Problem Relation Age of Onset   Breast cancer Paternal Grandmother    Heart disease Other    Colon cancer Neg Hx    Rectal cancer Neg Hx    Stomach cancer Neg Hx    Esophageal cancer Neg Hx     Social History Social History   Tobacco Use   Smoking status: Never   Smokeless tobacco: Never  Vaping  Use   Vaping status: Never Used  Substance Use Topics   Alcohol use: No   Drug use: No     Allergies   Patient has no known allergies.   Review of Systems Review of Systems   Physical Exam Triage Vital Signs ED Triage Vitals  Encounter Vitals Group     BP 08/21/23 1238 115/84     Systolic BP Percentile --      Diastolic BP Percentile --      Pulse Rate 08/21/23 1238 95     Resp 08/21/23 1238 16     Temp 08/21/23 1238 98.4 F (36.9 C)     Temp Source 08/21/23 1238 Oral     SpO2 08/21/23 1238 94 %     Weight --      Height --      Head Circumference --      Peak Flow --      Pain Score 08/21/23 1239 8     Pain Loc --      Pain Education --      Exclude from Growth Chart --    No data found.  Updated Vital Signs BP 115/84 (BP Location: Left Arm)   Pulse 95   Temp 98.4 F (36.9 C) (Oral)   Resp 16   SpO2 94%   Visual Acuity Right Eye Distance:   Left Eye Distance:   Bilateral Distance:    Right Eye Near:   Left Eye Near:    Bilateral Near:     Physical Exam Constitutional:      Appearance: Normal appearance.  Eyes:     Extraocular Movements: Extraocular movements intact.  Pulmonary:     Effort: Pulmonary effort is normal.  Genitourinary:    Comments: 1 x 2 cm immature abscess present to the left buttocks parallel to the rectum Neurological:     Mental Status: She is alert and  oriented to person, place, and time. Mental status is at baseline.      UC Treatments / Results  Labs (all labs ordered are listed, but only abnormal results are displayed) Labs Reviewed - No data to display  EKG   Radiology No results found.  Procedures Procedures (including critical care time)  Medications Ordered in UC Medications - No data to display  Initial Impression / Assessment and Plan / UC Course  I have reviewed the triage vital signs and the nursing notes.  Pertinent labs & imaging results that were available during my care of the patient were reviewed by me and considered in my medical decision making (see chart for details).  Abscess of left buttocks  Abscess is firm, unable to drain therefore I&D deferred, discussed this with patient, prescribed doxycycline and Tylenol 3, PDMP reviewed, low risk, recommended supportive measures and advised follow-up for nonhealing site Final Clinical Impressions(s) / UC Diagnoses   Final diagnoses:  Abscess of left buttock     Discharge Instructions      On Exam abscess is still firm and will not yield drainage if open today  Take the doxycycline every morning and every evening for 7 days  Use Tylenol 3 which has codeine in it for severe pain, please be mindful of this can make you drowsy  Hold warm-hot compresses to affected area at least 4 times a day, this helps to facilitate draining, the more the better  Please return for evaluation for increased swelling, increased tenderness or pain, non healing site, non draining site, you begin to  have fever or chills   We reviewed the etiology of recurrent abscesses of skin.  Skin abscesses are collections of pus within the dermis and deeper skin tissues. Skin abscesses manifest as painful, tender, fluctuant, and erythematous nodules, frequently surmounted by a pustule and surrounded by a rim of erythematous swelling.  Spontaneous drainage of purulent material may occur.   Fever can occur on occasion.    -Skin abscesses can develop in healthy individuals with no predisposing conditions other than skin or nasal carriage of Staphylococcus aureus.  Individuals in close contact with others who have active infection with skin abscesses are at increased risk which is likely to explain why twin brother has similar episodes.   In addition, any process leading to a breach in the skin barrier can also predispose to the development of a skin abscesses, such as atopic dermatitis.       ED Prescriptions     Medication Sig Dispense Auth. Provider   doxycycline (VIBRAMYCIN) 100 MG capsule Take 1 capsule (100 mg total) by mouth 2 (two) times daily. 14 capsule Marlana Mckowen R, NP   acetaminophen-codeine (TYLENOL #3) 300-30 MG tablet Take 1 tablet by mouth every 6 (six) hours as needed for moderate pain (pain score 4-6). 12 tablet Maeola Mchaney, Elita Boone, NP      I have reviewed the PDMP during this encounter.   Valinda Hoar, NP 08/21/23 1320

## 2023-08-21 NOTE — Telephone Encounter (Addendum)
 Patient has had boil previously in the same area. She has RA and is immunocompromised. No fever, chills.  Pain is getting worse and it is getting larger.  Colonoscopy showed no external hemorrhoids, and was normal. 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   Will go to urgent care for evaluation, has swelling into her left butt cheek. Possible cellulitis, hidradenitis suppurativa, boil etc. Instructed patient to go to urgent care for evaluation, may need culture/sensitivity if there is fluctuance or an abscess, high possibility of needing antibiotics antibiotics. Patient will message back how she is doing. Made consider referral to dermatology for evaluation of hidradenitis suppurativa

## 2023-08-21 NOTE — ED Triage Notes (Signed)
 Patient presents to [facility] for evaluation of "boil to my perineum".  Says she had pain develop in that area last week after her colonoscopy but it worsened through out the week, and she noticed swelling and inflammation in that area.  She did a televisit and was told to come in to be seen.  Denies fevers.

## 2023-08-21 NOTE — Telephone Encounter (Signed)
 Called & spoke with patient she has been experiencing rectal pain since her colon on 08/13/23 with Dr. Doy Hutching. Denies any bleeding. Feels a lump, history of boils in the same location. She is hesitant to go to urgent care d/t being immunocompromised. PCP office advised her to contact us.

## 2023-08-21 NOTE — Progress Notes (Signed)
  Because of pain and swelling after recent procedure, I feel your condition warrants further evaluation and I recommend that you be seen in a face-to-face visit. I first recommend calling the provider who performed the procedure to make them aware, so they can evaluate and get you taken care of.   NOTE: There will be NO CHARGE for this E-Visit   If you are having a true medical emergency, please call 911.     For an urgent face to face visit, [facility] has multiple urgent care centers for your convenience.  Click the link below for the full list of locations and hours, walk-in wait times, appointment scheduling options and driving directions:  Urgent Care - [place], [place], [place], [place], [place], [place]  [place]     Your MyChart E-visit questionnaire answers were reviewed by a board certified advanced clinical practitioner to complete your personal care plan based on your specific symptoms.    Thank you for using e-Visits.

## 2023-08-21 NOTE — Discharge Instructions (Addendum)
 On Exam abscess is still firm and will not yield drainage if open today  Take the doxycycline every morning and every evening for 7 days  Use Tylenol 3 which has codeine in it for severe pain, please be mindful of this can make you drowsy  Hold warm-hot compresses to affected area at least 4 times a day, this helps to facilitate draining, the more the better  Please return for evaluation for increased swelling, increased tenderness or pain, non healing site, non draining site, you begin to have fever or chills   We reviewed the etiology of recurrent abscesses of skin.  Skin abscesses are collections of pus within the dermis and deeper skin tissues. Skin abscesses manifest as painful, tender, fluctuant, and erythematous nodules, frequently surmounted by a pustule and surrounded by a rim of erythematous swelling.  Spontaneous drainage of purulent material may occur.  Fever can occur on occasion.    -Skin abscesses can develop in healthy individuals with no predisposing conditions other than skin or nasal carriage of Staphylococcus aureus.  Individuals in close contact with others who have active infection with skin abscesses are at increased risk which is likely to explain why twin brother has similar episodes.   In addition, any process leading to a breach in the skin barrier can also predispose to the development of a skin abscesses, such as atopic dermatitis.

## 2023-08-25 DIAGNOSIS — Z23 Encounter for immunization: Secondary | ICD-10-CM | POA: Diagnosis not present

## 2023-08-25 DIAGNOSIS — M7918 Myalgia, other site: Secondary | ICD-10-CM | POA: Diagnosis not present

## 2023-08-25 DIAGNOSIS — G4733 Obstructive sleep apnea (adult) (pediatric): Secondary | ICD-10-CM | POA: Diagnosis not present

## 2023-08-25 DIAGNOSIS — Z7689 Persons encountering health services in other specified circumstances: Secondary | ICD-10-CM | POA: Diagnosis not present

## 2023-08-25 DIAGNOSIS — G44211 Episodic tension-type headache, intractable: Secondary | ICD-10-CM | POA: Diagnosis not present

## 2023-09-09 ENCOUNTER — Encounter: Payer: Self-pay | Admitting: Family Medicine

## 2023-09-09 ENCOUNTER — Ambulatory Visit (INDEPENDENT_AMBULATORY_CARE_PROVIDER_SITE_OTHER): Payer: Self-pay | Admitting: Family Medicine

## 2023-09-09 VITALS — BP 111/76 | HR 70 | Ht 63.0 in | Wt 247.0 lb

## 2023-09-09 DIAGNOSIS — E66813 Obesity, class 3: Secondary | ICD-10-CM | POA: Diagnosis not present

## 2023-09-09 DIAGNOSIS — R7303 Prediabetes: Secondary | ICD-10-CM | POA: Diagnosis not present

## 2023-09-09 DIAGNOSIS — I2510 Atherosclerotic heart disease of native coronary artery without angina pectoris: Secondary | ICD-10-CM | POA: Diagnosis not present

## 2023-09-09 DIAGNOSIS — Z1231 Encounter for screening mammogram for malignant neoplasm of breast: Secondary | ICD-10-CM | POA: Diagnosis not present

## 2023-09-09 DIAGNOSIS — E782 Mixed hyperlipidemia: Secondary | ICD-10-CM | POA: Diagnosis not present

## 2023-09-09 DIAGNOSIS — Z9861 Coronary angioplasty status: Secondary | ICD-10-CM | POA: Diagnosis not present

## 2023-09-09 DIAGNOSIS — D500 Iron deficiency anemia secondary to blood loss (chronic): Secondary | ICD-10-CM

## 2023-09-09 DIAGNOSIS — Z6841 Body Mass Index (BMI) 40.0 and over, adult: Secondary | ICD-10-CM | POA: Diagnosis not present

## 2023-09-09 DIAGNOSIS — Z0000 Encounter for general adult medical examination without abnormal findings: Secondary | ICD-10-CM

## 2023-09-09 DIAGNOSIS — D75839 Thrombocytosis, unspecified: Secondary | ICD-10-CM

## 2023-09-09 DIAGNOSIS — Z7689 Persons encountering health services in other specified circumstances: Secondary | ICD-10-CM | POA: Diagnosis not present

## 2023-09-09 DIAGNOSIS — M0579 Rheumatoid arthritis with rheumatoid factor of multiple sites without organ or systems involvement: Secondary | ICD-10-CM | POA: Diagnosis not present

## 2023-09-09 NOTE — Progress Notes (Signed)
 Complete physical exam   Patient: Abigail Garcia   DOB: 1970/11/12   53 y.o. Female  MRN: [id number] Visit Date: 09/09/2023  Today's healthcare provider: [name], MD   Chief Complaint  Patient presents with   Annual Exam    Place on her L side she would like checked its itchy and has a bruise, under her arm pit there is some discoloration she is concerned about as well   Subjective    Abigail Garcia is a 53 y.o. female who presents today for a complete physical exam.   She reports consuming a general diet.   The patient does not participate in regular exercise at present.    She does not have additional problems to discuss today.   Discussed the use of AI scribe software for clinical note transcription with the patient, who gave verbal consent to proceed.  History of Present Illness Abigail Garcia is a 53 year old female who presents for an annual physical exam.  She has a history of obesity with a BMI of 43 and has faced challenges in obtaining coverage for GLP-1 medications for weight loss. Over the past year, she has experienced significant unintentional weight loss due to illness. She has not been consistent with her Wegovy medication because of health issues like pneumonia and colonoscopy preparation, which required temporary cessation of the medication. She is uncertain about how to restart the medication and at what dosage.  She has a history of rheumatoid arthritis and was previously on Remicade, which she believes led to immunosuppression and frequent infections such as shingles, HPV, and pneumonia. She has stopped taking methotrexate and other RA medications to focus on rebuilding her immune system. She is concerned about the impact of these medications on her health and is currently not on any RA treatment.  She has a history of mixed hyperlipidemia and is due for a lipid panel as part of her annual physical exam.  She has a history of thrombocytosis and  iron deficiency anemia, for which a CBC is recommended during this visit.  She has a history of prediabetes, and an A1c test is recommended for diabetes screening during this visit.  She mentions experiencing a bruise and discoloration on her left side, which she associates with previous shingles and pneumonia. She also notes darkening under her arms, which she attributes to post-inflammatory changes from shingles.  She reports ongoing lung pain and weakness in her right hand, particularly in the fourth and fifth digits, which she attributes to neuropathy.  She works from home and has grandchildren. She has not been able to see her grandchildren or travel due to her health issues.     Past Medical History:  Diagnosis Date   Anemia    Blood transfusion without reported diagnosis    CAD (coronary artery disease)    a. 11/2012 NSTEMI/Cath/PCI: LM nl, LAD 80-90 thrombotic (tx with Heparin x 2 days then aspiration thrombectomy and PTCA), RI nl, LCX sm/nl, RCA nl, EF 55-65%.   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 Echo (6/14) with EF 55-60%.    Hypertension    Menorrhagia    NSTEMI (non-ST elevated myocardial infarction) (HCC)    11/2012    Obesity    RA (rheumatoid arthritis) (HCC)    Past Surgical History:  Procedure Laterality  Date   bilateral tubal     CARDIAC CATHETERIZATION  2014   CESAREAN SECTION     CORONARY ANGIOGRAM  11/19/2012   Procedure: CORONARY ANGIOGRAM;  Surgeon: Peter M Swaziland, MD;  Location: [facility] CATH LAB;  Service: Cardiovascular;;   DILATION AND CURETTAGE OF UTERUS     DILATION AND CURETTAGE OF UTERUS N/A 03/13/2019   Procedure: DILATATION AND CURETTAGE;  Surgeon: Allie Bossier, MD;  Location: [facility] OR;  Service: Gynecology;  Laterality: N/A;   INTRAVASCULAR ULTRASOUND  11/19/2012   Procedure: INTRAVASCULAR ULTRASOUND;  Surgeon: Peter M Swaziland, MD;   Location: [facility] CATH LAB;  Service: Cardiovascular;;   LEFT HEART CATHETERIZATION WITH CORONARY ANGIOGRAM N/A 11/16/2012   Procedure: LEFT HEART CATHETERIZATION WITH CORONARY ANGIOGRAM;  Surgeon: Peter M Swaziland, MD;  Location: [facility] CATH LAB;  Service: Cardiovascular;  Laterality: N/A;   PERCUTANEOUS CORONARY INTERVENTION-BALLOON ONLY  11/19/2012   Procedure: PERCUTANEOUS CORONARY INTERVENTION-BALLOON ONLY;  Surgeon: Peter M Swaziland, MD;  Location: [facility] CATH LAB;  Service: Cardiovascular;;   Social History   Socioeconomic History   Marital status: Single    Spouse name: Not on file   Number of children: Not on file   Years of education: Not on file   Highest education level: Not on file  Occupational History   Not on file  Tobacco Use   Smoking status: Never   Smokeless tobacco: Never  Vaping Use   Vaping status: Never Used  Substance and Sexual Activity   Alcohol use: No   Drug use: No   Sexual activity: Yes    Birth control/protection: Surgical    Comment: BTL  Other Topics Concern   Not on file  Social History Narrative   Not on file   Social Drivers of Health   Financial Resource Strain: Low Risk  (08/25/2023)   Received from [facility]   Overall Financial Resource Strain (CARDIA)    Difficulty of Paying Living Expenses: Not very hard  Food Insecurity: No Food Insecurity (08/25/2023)   Received from [facility]   Hunger Vital Sign    Worried About Running Out of Food in the Last Year: Never true    Ran Out of Food in the Last Year: Never true  Transportation Needs: Unmet Transportation Needs (08/25/2023)   Received from [facility] - Transportation    In the past 12 months, has lack of transportation kept you from medical appointments or from getting medications?: Yes    Lack of Transportation (Non-Medical): Yes  Physical Activity: Unknown (07/20/2023)   Exercise Vital Sign    Days of Exercise per Week: 0 days     Minutes of Exercise per Session: Not on file  Stress: No Stress Concern Present (07/20/2023)   [employer] of Occupational Health - Occupational Stress Questionnaire    Feeling of Stress : Only a little  Social Connections: Unknown (07/20/2023)   Social Connection and Isolation Panel [NHANES]    Frequency of Communication with Friends and Family: More than three times a week    Frequency of Social Gatherings with Friends and Family: Patient declined    Attends Religious Services: Patient declined    Database administrator or Organizations: Patient declined    Attends Banker Meetings: Not on file    Marital Status: Patient declined  Intimate Partner Violence: Not At Risk (09/09/2023)   Humiliation, Afraid, Rape, and Kick questionnaire    Fear of Current or  Ex-Partner: No    Emotionally Abused: No    Physically Abused: No    Sexually Abused: No   Family Status  Relation Name Status   Mother  Alive   Father  Deceased       murdered   PGM  Deceased   Other  (Not Specified)   Neg Hx  (Not Specified)  No partnership data on file   Family History  Problem Relation Age of Onset   Breast cancer Paternal Grandmother    Heart disease Other    Colon cancer Neg Hx    Rectal cancer Neg Hx    Stomach cancer Neg Hx    Esophageal cancer Neg Hx    No Known Allergies   Medications: Outpatient Medications Prior to Visit  Medication Sig   aspirin 81 MG EC tablet Take 1 tablet (81 mg total) by mouth daily.   hydroxychloroquine (PLAQUENIL) 200 MG tablet Take 200 mg by mouth 2 (two) times daily.   megestrol (MEGACE) 40 MG tablet TAKE 2 TABLETS BY MOUTH DAILY. CAN INCREASE TO 2 TABLETS TWICE DAILY FOR HEAVY BLEEDING   meloxicam (MOBIC) 15 MG tablet Take 1 tablet (15 mg total) by mouth daily.   metoprolol tartrate (LOPRESSOR) 25 MG tablet Take 0.5 tablets (12.5 mg total) by mouth 2 (two) times daily.   ezetimibe (ZETIA) 10 MG tablet Take 1 tablet (10 mg total) by mouth daily.    methotrexate (RHEUMATREX) 2.5 MG tablet Take 15 mg by mouth once a week. Take 6 tablets(15 mg total) by mouth every 7 days for 90 days (Patient not taking: Reported on 09/09/2023)   Semaglutide-Weight Management 1 MG/0.5ML SOAJ Inject 1 mg into the skin once a week for 28 days. (Patient not taking: Reported on 09/09/2023)   [START ON 09/19/2023] Semaglutide-Weight Management 1.7 MG/0.75ML SOAJ Inject 1.7 mg into the skin once a week for 28 days. (Patient not taking: Reported on 09/09/2023)   Spacer/Aero-Hold Chamber Bags MISC 1 each by Does not apply route daily. (Patient not taking: Reported on 09/09/2023)   Spacer/Aero-Hold Chamber Bags MISC 1 each by Does not apply route daily. (Patient not taking: Reported on 07/30/2023)   Vitamin D, Ergocalciferol, (DRISDOL) 1.25 MG (50000 UNIT) CAPS capsule Take 50,000 Units by mouth once a week. (Patient not taking: Reported on 09/09/2023)   [DISCONTINUED] acetaminophen-codeine (TYLENOL #3) 300-30 MG tablet Take 1 tablet by mouth every 6 (six) hours as needed for moderate pain (pain score 4-6). (Patient not taking: Reported on 09/09/2023)   [DISCONTINUED] albuterol (PROVENTIL HFA) 108 (90 Base) MCG/ACT inhaler Inhale 1-2 puffs into the lungs every 6 (six) hours as needed for wheezing or shortness of breath. (Patient not taking: Reported on 09/09/2023)   [DISCONTINUED] doxycycline (VIBRAMYCIN) 100 MG capsule Take 1 capsule (100 mg total) by mouth 2 (two) times daily. (Patient not taking: Reported on 09/09/2023)   [DISCONTINUED] predniSONE (STERAPRED UNI-PAK 21 TAB) 10 MG (21) TBPK tablet Take as directed on package.  (60 mg po on day 1, 50 mg po on day 2...) (Patient not taking: Reported on 09/09/2023)   No facility-administered medications prior to visit.    Review of Systems  Last CBC Lab Results  Component Value Date   WBC 8.9 06/24/2023   HGB 13.3 06/24/2023   HCT 41.9 06/24/2023   MCV 77 (L) 06/24/2023   MCH 24.4 (L) 06/24/2023   RDW 14.7 06/24/2023   PLT 446  06/24/2023   Last metabolic panel Lab Results  Component Value Date  GLUCOSE 102 (H) 06/24/2023   NA 136 06/24/2023   K 4.2 06/24/2023   CL 101 06/24/2023   CO2 21 06/24/2023   BUN 12 06/24/2023   CREATININE 0.77 06/24/2023   EGFR 93 06/24/2023   CALCIUM 9.9 06/24/2023   PROT 7.9 06/24/2023   ALBUMIN 3.7 (L) 06/24/2023   LABGLOB 4.2 06/24/2023   AGRATIO 1.1 (L) 10/22/2022   BILITOT 0.3 06/24/2023   ALKPHOS 79 06/24/2023   AST 26 06/24/2023   ALT 17 06/24/2023   ANIONGAP 10 03/14/2019   Last lipids Lab Results  Component Value Date   CHOL 243 (H) 10/22/2022   HDL 42 10/22/2022   LDLCALC 180 (H) 10/22/2022   TRIG 114 10/22/2022   CHOLHDL 5.8 (H) 10/22/2022   Last hemoglobin A1c Lab Results  Component Value Date   HGBA1C 5.7 (H) 10/22/2022   Last thyroid functions Lab Results  Component Value Date   TSH 1.030 10/22/2022       Objective    BP 111/76   Pulse 70   Ht 5\' 3"  (1.6 m)   Wt 247 lb (112 kg)   SpO2 100%   BMI 43.75 kg/m  BP Readings from Last 3 Encounters:  09/09/23 111/76  08/21/23 115/84  08/13/23 111/81   Wt Readings from Last 3 Encounters:  09/09/23 247 lb (112 kg)  08/13/23 245 lb (111.1 kg)  07/30/23 245 lb (111.1 kg)        Physical Exam Vitals reviewed.  Constitutional:      General: She is not in acute distress.    Appearance: Normal appearance. She is not ill-appearing, toxic-appearing or diaphoretic.  HENT:     Head: Normocephalic and atraumatic.     Right Ear: Tympanic membrane and external ear normal. There is no impacted cerumen.     Left Ear: Tympanic membrane and external ear normal. There is no impacted cerumen.     Nose: Nose normal.     Mouth/Throat:     Pharynx: Oropharynx is clear.  Eyes:     General: No scleral icterus.    Extraocular Movements: Extraocular movements intact.     Conjunctiva/sclera: Conjunctivae normal.     Pupils: Pupils are equal, round, and reactive to light.  Cardiovascular:     Rate  and Rhythm: Normal rate and regular rhythm.     Pulses: Normal pulses.     Heart sounds: Normal heart sounds. No murmur heard.    No friction rub. No gallop.  Pulmonary:     Effort: Pulmonary effort is normal. No respiratory distress.     Breath sounds: Normal breath sounds. No wheezing, rhonchi or rales.  Abdominal:     General: Bowel sounds are normal. There is no distension.     Palpations: Abdomen is soft. There is no mass.     Tenderness: There is no abdominal tenderness. There is no guarding.  Musculoskeletal:        General: No deformity.     Cervical back: Normal range of motion and neck supple.     Right lower leg: No edema.     Left lower leg: No edema.  Lymphadenopathy:     Cervical: No cervical adenopathy.  Skin:    General: Skin is warm.     Capillary Refill: Capillary refill takes less than 2 seconds.     Findings: Rash present. No erythema. Rash is macular.          Comments: Left axillary hyperpigmentation   Neurological:  General: No focal deficit present.     Mental Status: She is alert and oriented to person, place, and time.     Cranial Nerves: Cranial nerves 2-12 are intact. No cranial nerve deficit or facial asymmetry.     Motor: Motor function is intact. No weakness.     Gait: Gait normal.  Psychiatric:        Mood and Affect: Mood normal.        Behavior: Behavior normal.       Last depression screening scores    09/09/2023    9:31 AM 06/24/2023    9:45 AM 10/22/2022    1:57 PM  PHQ 2/9 Scores  PHQ - 2 Score 0 0 2  PHQ- 9 Score  0 4    Last fall risk screening    07/21/2023   10:11 AM  Fall Risk   Falls in the past year? 0  Number falls in past yr: 0  Injury with Fall? 0  Follow up Falls evaluation completed    Last Audit-C alcohol use screening    07/20/2023   11:39 PM  Alcohol Use Disorder Test (AUDIT)  1. How often do you have a drink containing alcohol? 0   A score of 3 or more in women, and 4 or more in men indicates  increased risk for alcohol abuse, EXCEPT if all of the points are from question 1   No results found for any visits on 09/09/23.  Assessment & Plan    Routine Health Maintenance and Physical Exam  Immunization History  Administered Date(s) Administered   Pneumococcal Conjugate-13 01/08/2016   Pneumococcal-Unspecified 01/02/2016   Tdap 12/20/2014   Zoster Recombinant(Shingrix) 01/02/2023    Health Maintenance  Topic Date Due   COVID-19 Vaccine (1) Never done   Pneumococcal Vaccine 47-36 Years old (2 of 2 - PPSV23 or PCV20) 03/04/2016   MAMMOGRAM  09/08/2023   Zoster Vaccines- Shingrix (2 of 2) 10/18/2023 (Originally 02/27/2023)   INFLUENZA VACCINE  01/02/2024   DTaP/Tdap/Td (2 - Td or Tdap) 12/19/2024   Cervical Cancer Screening (HPV/Pap Cotest)  06/09/2028   Colonoscopy  08/12/2033   Hepatitis C Screening  Completed   HIV Screening  Completed   HPV VACCINES  Aged Out    Problem List Items Addressed This Visit       Cardiovascular and Mediastinum   CAD S/P percutaneous coronary angioplasty     Musculoskeletal and Integument   Rheumatoid arthritis involving multiple sites with positive rheumatoid factor (HCC)   Relevant Orders   CMP14+EGFR   CBC     Hematopoietic and Hemostatic   Thrombocytosis   Relevant Orders   CBC     Other   Prediabetes   Relevant Orders   Hemoglobin A1c   Obesity   Relevant Orders   CMP14+EGFR   Hyperlipidemia   Relevant Orders   Lipid panel   Anemia, iron deficiency   Other Visit Diagnoses       Annual physical exam    -  Primary     Encounter for screening mammogram for malignant neoplasm of breast            Assessment & Plan Obesity BMI of 43. Off Wegovy due to recent illnesses, unsure about restarting. Significant weight loss due to illness, not intentional. - Start Wegovy at 0.25 mg and titrate up as tolerated - Recommend 150 minutes of exercise per week - Encourage a healthy diet  Rheumatoid Arthritis Previously on  Remicade, which led  to frequent infections. Currently off all RA medications to rebuild immune system. Seeking new rheumatologist due to dissatisfaction with current treatment approach. - Refer to a new rheumatologist for management  Pneumonia Recent pneumonia, resolved. Used Albuterol as needed. Hesitant about pneumococcal vaccine due to past vaccine-related immune issues. - Provide information on pneumococcal vaccine for consideration  Mixed Hyperlipidemia Mixed hyperlipidemia, no current medications. - Order lipid panel  Prediabetes Prediabetes, no current medications. - Order A1c for diabetes screening  Thrombocytosis Thrombocytosis, no current medications. - Order CBC to monitor platelet levels  Iron Deficiency Anemia Iron deficiency anemia, no current medications. - Order CBC to monitor hemoglobin levels  General Health Maintenance Annual physical examination. Hesitant about vaccines due to past experiences with shingles vaccine. Mammogram needs to be scheduled. - Recommend pneumococcal vaccine - Order CMP for general health assessment - Encourage scheduling of mammogram  Follow-up Follow-up appointment for further evaluation and management. - Schedule follow-up appointment in May or June       Return in about 2 months (around 11/09/2023) for RA.  Scheduled for May to follow up on chronic conditions       [name], MD  [facility] [phone] (phone) [phone] (fax)  [facility]

## 2023-09-09 NOTE — Patient Instructions (Signed)
 Please make sure to schedule your mammogram for breast cancer screening.   Recommended Vaccines  -Pneumococcal Vaccine

## 2023-09-10 LAB — CMP14+EGFR
ALT: 9 IU/L (ref 0–32)
AST: 11 IU/L (ref 0–40)
Albumin: 4 g/dL (ref 3.8–4.9)
Alkaline Phosphatase: 85 IU/L (ref 44–121)
BUN/Creatinine Ratio: 15 (ref 9–23)
BUN: 11 mg/dL (ref 6–24)
Bilirubin Total: 0.3 mg/dL (ref 0.0–1.2)
CO2: 22 mmol/L (ref 20–29)
Calcium: 9.8 mg/dL (ref 8.7–10.2)
Chloride: 102 mmol/L (ref 96–106)
Creatinine, Ser: 0.74 mg/dL (ref 0.57–1.00)
Globulin, Total: 3.6 g/dL (ref 1.5–4.5)
Glucose: 88 mg/dL (ref 70–99)
Potassium: 4 mmol/L (ref 3.5–5.2)
Sodium: 139 mmol/L (ref 134–144)
Total Protein: 7.6 g/dL (ref 6.0–8.5)
eGFR: 97 mL/min/{1.73_m2} (ref >59)

## 2023-09-10 LAB — CBC
Hematocrit: 39.6 % (ref 34.0–46.6)
Hemoglobin: 12.5 g/dL (ref 11.1–15.9)
MCH: 24.6 pg — ABNORMAL LOW (ref 26.6–33.0)
MCHC: 31.6 g/dL (ref 31.5–35.7)
MCV: 78 fL — ABNORMAL LOW (ref 79–97)
Platelets: 356 10*3/uL (ref 150–450)
RBC: 5.08 x10E6/uL (ref 3.77–5.28)
RDW: 15.3 % (ref 11.7–15.4)
WBC: 8.4 10*3/uL (ref 3.4–10.8)

## 2023-09-10 LAB — LIPID PANEL
Chol/HDL Ratio: 6.1 ratio — ABNORMAL HIGH (ref 0.0–4.4)
Cholesterol, Total: 262 mg/dL — ABNORMAL HIGH (ref 100–199)
HDL: 43 mg/dL (ref >39)
LDL Chol Calc (NIH): 191 mg/dL — ABNORMAL HIGH (ref 0–99)
Triglycerides: 149 mg/dL (ref 0–149)
VLDL Cholesterol Cal: 28 mg/dL (ref 5–40)

## 2023-09-10 LAB — HEMOGLOBIN A1C
Est. average glucose Bld gHb Est-mCnc: 117 mg/dL
Hgb A1c MFr Bld: 5.7 % — ABNORMAL HIGH (ref 4.8–5.6)

## 2023-09-13 DIAGNOSIS — Z419 Encounter for procedure for purposes other than remedying health state, unspecified: Secondary | ICD-10-CM | POA: Diagnosis not present

## 2023-09-15 ENCOUNTER — Encounter: Payer: Self-pay | Admitting: Family Medicine

## 2023-10-09 ENCOUNTER — Ambulatory Visit

## 2023-10-09 ENCOUNTER — Other Ambulatory Visit (HOSPITAL_COMMUNITY)
Admission: RE | Admit: 2023-10-09 | Discharge: 2023-10-09 | Disposition: A | Discharge status: Home / Self Care | Source: Ambulatory Visit | Attending: Obstetrics & Gynecology | Admitting: Obstetrics & Gynecology

## 2023-10-09 DIAGNOSIS — L910 Hypertrophic scar: Secondary | ICD-10-CM | POA: Diagnosis not present

## 2023-10-09 DIAGNOSIS — D2262 Melanocytic nevi of left upper limb, including shoulder: Secondary | ICD-10-CM | POA: Diagnosis not present

## 2023-10-09 DIAGNOSIS — D225 Melanocytic nevi of trunk: Secondary | ICD-10-CM | POA: Diagnosis not present

## 2023-10-09 DIAGNOSIS — B9689 Other specified bacterial agents as the cause of diseases classified elsewhere: Secondary | ICD-10-CM

## 2023-10-09 DIAGNOSIS — D2261 Melanocytic nevi of right upper limb, including shoulder: Secondary | ICD-10-CM | POA: Diagnosis not present

## 2023-10-09 DIAGNOSIS — R300 Dysuria: Secondary | ICD-10-CM | POA: Insufficient documentation

## 2023-10-09 DIAGNOSIS — Z7689 Persons encountering health services in other specified circumstances: Secondary | ICD-10-CM | POA: Diagnosis not present

## 2023-10-09 DIAGNOSIS — L739 Follicular disorder, unspecified: Secondary | ICD-10-CM | POA: Diagnosis not present

## 2023-10-09 DIAGNOSIS — L2089 Other atopic dermatitis: Secondary | ICD-10-CM | POA: Diagnosis not present

## 2023-10-09 LAB — POCT URINALYSIS DIPSTICK
Leukocytes, UA: NEGATIVE
Nitrite, UA: NEGATIVE

## 2023-10-09 NOTE — Progress Notes (Signed)
 SUBJECTIVE:  53 y.o. female complains of clear vaginal discharge for 1 week(s) and dysuria, urinary urgency, and urinary frequency Denies abnormal vaginal bleeding or significant pelvic pain or fever.Denies history of known exposure to STD.  No LMP recorded. Patient is perimenopausal.  OBJECTIVE:  She appears alert, well appearing, in no apparent distress Urine dipstick: positive for RBC's.  ASSESSMENT:  Vaginal Discharge  Vaginal Odor Dysuria    PLAN:  BVAG, CVAG probe and urine culture sent to lab. Treatment: To be determined once lab results are received ROV prn if symptoms persist or worsen.

## 2023-10-10 ENCOUNTER — Encounter: Payer: Self-pay | Admitting: Obstetrics & Gynecology

## 2023-10-10 LAB — CERVICOVAGINAL ANCILLARY ONLY
Bacterial Vaginitis (gardnerella): POSITIVE — AB
Candida Glabrata: NEGATIVE
Candida Vaginitis: NEGATIVE
Comment: NEGATIVE
Comment: NEGATIVE
Comment: NEGATIVE

## 2023-10-10 MED ORDER — METRONIDAZOLE 500 MG PO TABS: 500.0000 mg | ORAL_TABLET | Freq: Two times a day (BID) | ORAL | 0 refills | Status: AC

## 2023-10-10 NOTE — Addendum Note (Signed)
 Addended by: Lenoard Rad A on: 10/10/2023 06:35 PM   Modules accepted: Orders

## 2023-10-11 ENCOUNTER — Encounter: Payer: Self-pay | Admitting: Obstetrics & Gynecology

## 2023-10-11 LAB — URINE CULTURE

## 2023-10-13 DIAGNOSIS — Z419 Encounter for procedure for purposes other than remedying health state, unspecified: Secondary | ICD-10-CM | POA: Diagnosis not present

## 2023-10-21 NOTE — Progress Notes (Signed)
 Established patient visit   Patient: Abigail Garcia   DOB: June 15, 1970   53 y.o. Female  MRN: [id number] Visit Date: 10/22/2023  Today's healthcare provider: Mimi Alt, MD   Chief Complaint  Patient presents with   Weight Loss    Discuss medication    Subjective     HPI     Weight Loss    Additional comments: Discuss medication       Last edited by Bart Lieu, CMA on 10/22/2023  8:16 AM.       Discussed the use of AI scribe software for clinical note transcription with the patient, who gave verbal consent to proceed.  History of Present Illness Abigail Garcia is a 53 year old female who presents for a chronic follow-up.  She currently weighs 251 pounds, an increase of four pounds since her last visit in April 2025, with a BMI of 44.46. She was previously started on Wegovy  0.25 mg weekly but discontinued it after the first dose due to nausea. She is interested in restarting Wegovy  and has the medication at home.  Her goal is to improve her overall health rather than achieve a specific weight. She has been overweight since childhood, describing herself as 'always being plus size' since the age of 45. She identifies as an Surveyor, quantity, particularly after her father's death when she was 27 years old. She recalls that her initial weight management efforts were prompted by headaches last year, and weight loss was suggested as a potential remedy. She has never been below 200 pounds in her adult life.  She wants to resume physical therapy, specifically water therapy, which she found beneficial for her joints. She previously attended water therapy once a week and physical therapy once a week, prescribed by her rheumatologist.     Past Medical History:  Diagnosis Date   Anemia    Blood transfusion without reported diagnosis    CAD (coronary artery disease)    a. 11/2012 NSTEMI/Cath/PCI: LM nl, LAD 80-90 thrombotic (tx with Heparin  x 2 days then  aspiration thrombectomy and PTCA), RI nl, LCX sm/nl, RCA nl, EF 55-65%.   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 Echo (6/14) with EF 55-60%.    Hypertension    Menorrhagia    NSTEMI (non-ST elevated myocardial infarction) (HCC)    11/2012    Obesity    RA (rheumatoid arthritis) (HCC)     Medications: Outpatient Medications Prior to Visit  Medication Sig   aspirin  81 MG EC tablet Take 1 tablet (81 mg total) by mouth daily.   megestrol  (MEGACE ) 40 MG tablet TAKE 2 TABLETS BY MOUTH DAILY. CAN INCREASE TO 2 TABLETS TWICE DAILY FOR HEAVY BLEEDING   meloxicam  (MOBIC ) 15 MG tablet Take 1 tablet (15 mg total) by mouth daily.   metoprolol  tartrate (LOPRESSOR ) 25 MG tablet Take 0.5 tablets (12.5 mg total) by mouth 2 (two) times daily.   ezetimibe  (ZETIA ) 10 MG tablet Take 1 tablet (10 mg total) by mouth daily.   hydroxychloroquine (PLAQUENIL) 200 MG tablet Take 200 mg by mouth 2 (two) times daily. (Patient not taking: Reported on 10/22/2023)   methotrexate (RHEUMATREX) 2.5 MG tablet Take 15 mg by mouth once a week. Take 6 tablets(15 mg total) by mouth every 7 days for 90 days (Patient not taking:  Reported on 07/30/2023)   Spacer/Aero-Hold Chamber Bags MISC 1 each by Does not apply route daily. (Patient not taking: Reported on 07/30/2023)   Spacer/Aero-Hold Chamber Bags MISC 1 each by Does not apply route daily. (Patient not taking: Reported on 10/22/2023)   Vitamin D , Ergocalciferol , (DRISDOL) 1.25 MG (50000 UNIT) CAPS capsule Take 50,000 Units by mouth once a week. (Patient not taking: Reported on 08/13/2023)   No facility-administered medications prior to visit.    Review of Systems  Last metabolic panel Lab Results  Component Value Date   GLUCOSE 88 09/09/2023   NA 139 09/09/2023   K 4.0 09/09/2023   CL 102 09/09/2023   CO2 22 09/09/2023   BUN 11 09/09/2023   CREATININE 0.74  09/09/2023   EGFR 97 09/09/2023   CALCIUM  9.8 09/09/2023   PROT 7.6 09/09/2023   ALBUMIN 4.0 09/09/2023   LABGLOB 3.6 09/09/2023   AGRATIO 1.1 (L) 10/22/2022   BILITOT 0.3 09/09/2023   ALKPHOS 85 09/09/2023   AST 11 09/09/2023   ALT 9 09/09/2023   ANIONGAP 10 03/14/2019        Objective    BP 115/87   Pulse 73   Ht 5\' 3"  (1.6 m)   Wt 251 lb (113.9 kg)   SpO2 99%   BMI 44.46 kg/m  BP Readings from Last 3 Encounters:  10/22/23 115/87  09/09/23 111/76  08/21/23 115/84   Wt Readings from Last 3 Encounters:  10/22/23 251 lb (113.9 kg)  09/09/23 247 lb (112 kg)  08/13/23 245 lb (111.1 kg)        Physical Exam  General: Alert, no acute distress Cardio: Normal S1 and S2, RRR, no r/m/g Pulm: CTAB, normal work of breathing ABD: soft, abdomen is not distended, there is no tenderness to palpation, normal BS  Extremities: no LE edema    No results found for any visits on 10/22/23.  Assessment & Plan     Problem List Items Addressed This Visit       Musculoskeletal and Integument   Rheumatoid arthritis involving multiple sites with positive rheumatoid factor (HCC)   Chronic Managed by rheumatology  Patient on Remicade  infusions, Methotrexate 15mg  weekly, and Plaquenil 200mg  BID. Meloxicam  15mg  used PRN for pain, currently every other day. -Continue current regimen.      Relevant Orders   Ambulatory referral to Physical Therapy     Other   Obesity - Primary   Obesity Obesity with a BMI of 44.46. Weight increased by 4 pounds since April 2025. Previously started on Wegovy  0.25 mg weekly but discontinued after the first dose due to illness. Interested in restarting Wegovy . Goal is a healthier lifestyle rather than a specific weight target. Discussed setting a goal weight of 200 pounds to calculate a calorie deficit and exercise plan. Discussed potential side effects of Wegovy , including constipation and nausea, and management strategies. Emphasized the importance of a  well-balanced diet and regular exercise for weight loss. - Restart Wegovy  0.25 mg weekly for 4 weeks, then increase to 0.5 mg weekly for 4 weeks. - Recommend 240 minutes of exercise per week, aiming for 1 hour, 4 days a week. - Set a calorie intake goal of 2400-2600 calories per day, with a 200 calorie variance. - Order physical therapy referral for water aerobics twice a week. - Advise increasing water intake to 1.5-2 liters per day. - Recommend Miralax, a capful once a day, if constipation occurs. - Schedule follow-up in 2 months for weight management.  Relevant Medications   Semaglutide -Weight Management 0.25 MG/0.5ML SOAJ   Semaglutide -Weight Management 0.5 MG/0.5ML SOAJ (Start on 11/20/2023)   Other Relevant Orders   Ambulatory referral to Physical Therapy    Assessment & Plan    Return in about 2 months (around 12/22/2023) for Weight MGMT.         Mimi Alt, MD  [facility] [phone] (phone) [phone] (fax)  [facility]

## 2023-10-22 ENCOUNTER — Encounter: Payer: Self-pay | Admitting: Family Medicine

## 2023-10-22 ENCOUNTER — Ambulatory Visit: Payer: Self-pay | Admitting: Family Medicine

## 2023-10-22 VITALS — BP 115/87 | HR 73 | Ht 63.0 in | Wt 251.0 lb

## 2023-10-22 DIAGNOSIS — M0579 Rheumatoid arthritis with rheumatoid factor of multiple sites without organ or systems involvement: Secondary | ICD-10-CM

## 2023-10-22 DIAGNOSIS — Z6841 Body Mass Index (BMI) 40.0 and over, adult: Secondary | ICD-10-CM

## 2023-10-22 DIAGNOSIS — E66813 Obesity, class 3: Secondary | ICD-10-CM

## 2023-10-22 DIAGNOSIS — Z7689 Persons encountering health services in other specified circumstances: Secondary | ICD-10-CM | POA: Diagnosis not present

## 2023-10-22 MED ORDER — SEMAGLUTIDE-WEIGHT MANAGEMENT 0.25 MG/0.5ML ~~LOC~~ SOAJ: 0.2500 mg | SUBCUTANEOUS | Status: AC

## 2023-10-22 MED ORDER — SEMAGLUTIDE-WEIGHT MANAGEMENT 0.5 MG/0.5ML ~~LOC~~ SOAJ: 0.5000 mg | SUBCUTANEOUS | 0 refills | Status: AC

## 2023-10-22 NOTE — Assessment & Plan Note (Signed)
 Obesity Obesity with a BMI of 44.46. Weight increased by 4 pounds since April 2025. Previously started on Wegovy  0.25 mg weekly but discontinued after the first dose due to illness. Interested in restarting Wegovy . Goal is a healthier lifestyle rather than a specific weight target. Discussed setting a goal weight of 200 pounds to calculate a calorie deficit and exercise plan. Discussed potential side effects of Wegovy , including constipation and nausea, and management strategies. Emphasized the importance of a well-balanced diet and regular exercise for weight loss. - Restart Wegovy  0.25 mg weekly for 4 weeks, then increase to 0.5 mg weekly for 4 weeks. - Recommend 240 minutes of exercise per week, aiming for 1 hour, 4 days a week. - Set a calorie intake goal of 2400-2600 calories per day, with a 200 calorie variance. - Order physical therapy referral for water aerobics twice a week. - Advise increasing water intake to 1.5-2 liters per day. - Recommend Miralax, a capful once a day, if constipation occurs. - Schedule follow-up in 2 months for weight management.

## 2023-10-22 NOTE — Assessment & Plan Note (Signed)
 Chronic Managed by rheumatology  Patient on Remicade infusions, Methotrexate 15mg  weekly, and Plaquenil 200mg  BID. Meloxicam 15mg  used PRN for pain, currently every other day. -Continue current regimen.

## 2023-10-22 NOTE — Patient Instructions (Signed)
 Calorie Goal: 2400 (2200-2600)  Exercise Goal: 240 mins per week (1 hour 4 times per week)

## 2023-11-04 ENCOUNTER — Other Ambulatory Visit: Payer: Self-pay | Admitting: Medical Genetics

## 2023-11-05 ENCOUNTER — Other Ambulatory Visit
Admission: RE | Admit: 2023-11-05 | Discharge: 2023-11-05 | Disposition: A | Discharge status: Home / Self Care | Payer: Self-pay | Source: Ambulatory Visit | Attending: Medical Genetics | Admitting: Medical Genetics

## 2023-11-05 DIAGNOSIS — Z7689 Persons encountering health services in other specified circumstances: Secondary | ICD-10-CM | POA: Diagnosis not present

## 2023-11-10 ENCOUNTER — Ambulatory Visit: Admitting: Family Medicine

## 2023-11-11 ENCOUNTER — Ambulatory Visit
Admission: RE | Admit: 2023-11-11 | Discharge: 2023-11-11 | Disposition: A | Discharge status: Home / Self Care | Source: Ambulatory Visit | Attending: Obstetrics & Gynecology | Admitting: Obstetrics & Gynecology

## 2023-11-11 DIAGNOSIS — Z1231 Encounter for screening mammogram for malignant neoplasm of breast: Secondary | ICD-10-CM | POA: Diagnosis not present

## 2023-11-11 DIAGNOSIS — Z7689 Persons encountering health services in other specified circumstances: Secondary | ICD-10-CM | POA: Diagnosis not present

## 2023-11-13 DIAGNOSIS — Z419 Encounter for procedure for purposes other than remedying health state, unspecified: Secondary | ICD-10-CM | POA: Diagnosis not present

## 2023-11-16 LAB — GENECONNECT MOLECULAR SCREEN: Genetic Analysis Overall Interpretation: NEGATIVE

## 2023-11-17 DIAGNOSIS — Z7689 Persons encountering health services in other specified circumstances: Secondary | ICD-10-CM | POA: Diagnosis not present

## 2023-11-21 ENCOUNTER — Encounter: Payer: Self-pay | Admitting: Family Medicine

## 2023-11-23 ENCOUNTER — Encounter: Payer: Self-pay | Admitting: Obstetrics & Gynecology

## 2023-11-24 NOTE — Telephone Encounter (Signed)
 Could someone please reach out to offer this pt an appt with the 1st available provider to discuss this concern

## 2023-11-26 NOTE — Progress Notes (Unsigned)
 Cardiology Office Note    Date:  11/27/2023   ID:  Abigail Garcia, DOB [birth year], MRN [id number]  PCP:  Sharma Coyer, MD  Cardiologist:  None  Electrophysiologist:  None   Chief Complaint: Follow up  History of Present Illness:   Abigail Garcia is a 53 y.o. female with history of CAD s/p NSTEMI 11/2012, hypertension, hyperlipidemia, obesity, and rheumatoid arthritis who presents for follow up on CAD and hyperlipidemia.    Patient has history of CAD s/p NSTEMI 11/2012 with thrombus noted in the mid LAD.  She underwent aspiration thrombectomy and PTCA.  Echo showed EF 55 to 60%, G1 DD.  She was treated with Plavix  until it was discontinued 01/2014.  GXT 08/2013 for atypical chest pain showed poor exercise tolerance but no ischemic EKG changes.   Patient was most recently seen by Dr. Gollan 11/2022 and overall doing well from a cardiac perspective.  She reported intolerance to Lipitor  and Crestor .  She was started on Zetia  and Repatha . However, Repatha  PA was denied. She requested calcium  scoring for risk stratification.  However, does not appear that this was completed.  Patient reports to clinic today doing overall well from a cardiac perspective.  She is without symptoms of angina and cardiac decompensation.  She reports that she has been traveling more frequently for the past several months.  When she travels, whether that be by plane or car, she notes swelling in her upper extremities near her antecubital region which is somewhat painful.  She notes 3 recent trips, 2 by plane and one 5-hour drive during which this has occurred.  This resolves after travel is completed. She is concerned that this could be related to her heart. She denies lower extremity swelling, orthopnea, and dyspnea.  She reports menorrhagia which is being managed by her PCP.  Recent labs with stable blood counts.  She denies other bleeding and hematochezia.  Labs independently reviewed: 09/09/2023-TC 262, TG 149, HDL  43, LDL 191, Hgb 12.5, HCT 39.6, platelets 356, BUN 11, creatinine 0.74, NA 139, K4.0, normal LFTs, A1c 5.7  Objective   Past Medical History:  Diagnosis Date   Anemia    Blood transfusion without reported diagnosis    CAD (coronary artery disease)    a. 11/2012 NSTEMI/Cath/PCI: LM nl, LAD 80-90 thrombotic (tx with Heparin  x 2 days then aspiration thrombectomy and PTCA), RI nl, LCX sm/nl, RCA nl, EF 55-65%.   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 Echo (6/14) with EF 55-60%.    Hypertension    Menorrhagia    NSTEMI (non-ST elevated myocardial infarction) (HCC)    11/2012    Obesity    RA (rheumatoid arthritis) (HCC)     Current Medications: Current Meds  Medication Sig   aspirin  81 MG EC tablet Take 1 tablet (81 mg total) by mouth daily.   Evolocumab  (REPATHA  SURECLICK) 140 MG/ML SOAJ Inject 140 mg into the skin every 14 (fourteen) days.   megestrol  (MEGACE ) 40 MG tablet TAKE 2 TABLETS BY MOUTH DAILY. CAN INCREASE TO 2 TABLETS TWICE DAILY FOR HEAVY BLEEDING   meloxicam  (MOBIC ) 15 MG tablet Take 1 tablet (15 mg total) by mouth daily.   metoprolol  tartrate (LOPRESSOR ) 25 MG tablet Take 0.5 tablets (12.5 mg total) by mouth 2 (two) times daily.   Semaglutide -Weight Management 0.5 MG/0.5ML SOAJ  Inject 0.5 mg into the skin once a week for 28 days.    Allergies:   Patient has no known allergies.   Social History   Socioeconomic History   Marital status: Single    Spouse name: Not on file   Number of children: Not on file   Years of education: Not on file   Highest education level: Not on file  Occupational History   Not on file  Tobacco Use   Smoking status: Never   Smokeless tobacco: Never  Vaping Use   Vaping status: Never Used  Substance and Sexual Activity   Alcohol use: No   Drug use: No   Sexual activity: Yes    Birth control/protection: Surgical     Comment: BTL  Other Topics Concern   Not on file  Social History Narrative   Not on file   Social Drivers of Health   Financial Resource Strain: Low Risk  (08/25/2023)   Received from [facility]   Overall Financial Resource Strain (CARDIA)    Difficulty of Paying Living Expenses: Not very hard  Food Insecurity: No Food Insecurity (08/25/2023)   Received from [facility]   Hunger Vital Sign    Within the past 12 months, you worried that your food would run out before you got the money to buy more.: Never true    Within the past 12 months, the food you bought just didn't last and you didn't have money to get more.: Never true  Transportation Needs: Unmet Transportation Needs (08/25/2023)   Received from [facility] - Transportation    In the past 12 months, has lack of transportation kept you from medical appointments or from getting medications?: Yes    Lack of Transportation (Non-Medical): Yes  Physical Activity: Unknown (07/20/2023)   Exercise Vital Sign    Days of Exercise per Week: 0 days    Minutes of Exercise per Session: Not on file  Stress: No Stress Concern Present (07/20/2023)   [employer] of Occupational Health - Occupational Stress Questionnaire    Feeling of Stress : Only a little  Social Connections: Unknown (07/20/2023)   Social Connection and Isolation Panel    Frequency of Communication with Friends and Family: More than three times a week    Frequency of Social Gatherings with Friends and Family: Patient declined    Attends Religious Services: Patient declined    Database administrator or Organizations: Patient declined    Attends Engineer, structural: Not on file    Marital Status: Patient declined     Family History:  The patient's family history includes Breast cancer in her paternal grandmother; Heart disease in an other family member. There is no history of Colon cancer, Rectal  cancer, Stomach cancer, or Esophageal cancer.  ROS:   12-point review of systems is negative unless otherwise noted in the HPI.  EKGs/Other Studies Reviewed:    EKG:  EKG personally reviewed by me today EKG Interpretation Date/Time:  Thursday November 27 2023 08:39:29 EDT Ventricular Rate:  88 PR Interval:  128 QRS Duration:  74 QT Interval:  354 QTC Calculation: 428 R Axis:   -27  Text Interpretation: Normal sinus rhythm When compared with ECG of 11-Mar-2019 16:19, Premature ventricular complexes are no longer Present QRS axis Shifted left Non-specific change in ST segment in Inferior leads Confirmed by Lorene Sinclair ([id number]) on 11/27/2023 8:43:09 AM  PHYSICAL EXAM:    VS:  BP 108/74   Pulse 88   Ht 5' 3 (1.6 m)   Wt 247 lb 12.8 oz (112.4 kg)   SpO2 98%   BMI 43.90 kg/m   BMI: Body mass index is 43.9 kg/m.  Physical Exam Vitals and nursing note reviewed.  Constitutional:      General: She is not in acute distress.    Appearance: Normal appearance.   Cardiovascular:     Rate and Rhythm: Normal rate and regular rhythm.     Heart sounds: No murmur heard. Pulmonary:     Effort: Pulmonary effort is normal. No respiratory distress.     Breath sounds: No wheezing or rales.   Musculoskeletal:     Right lower leg: No edema.     Left lower leg: No edema.   Skin:    General: Skin is warm and dry.   Neurological:     General: No focal deficit present.     Mental Status: She is alert and oriented to person, place, and time. Mental status is at baseline.   Psychiatric:        Mood and Affect: Mood normal.        Behavior: Behavior normal.     Wt Readings from Last 3 Encounters:  11/27/23 247 lb 12.8 oz (112.4 kg)  10/22/23 251 lb (113.9 kg)  09/09/23 247 lb (112 kg)       ASSESSMENT & PLAN:   Coronary artery disease - NSTEMI s/p PTCA 11/2012 with echo at that time showing normal EF.  She is without symptoms of angina or cardiac decompensation.  No symptoms of dyspnea  or lower extremity edema.  However, given new upper extremity swelling patient would prefer to update echocardiogram.  Continue aspirin  81 mg daily and metoprolol  tartrate 12.5 mg twice daily.  Cholesterol management as detailed below.  Hyperlipidemia - Most recent lipid panel 09/2023 with LDL above goal at 191.  Patient is intolerant to atorvastatin  and rosuvastatin  due to nausea.  Patient reports that she stopped taking Zetia  several months ago but does not remember why.  Recommend restarting Zetia  and starting Repatha  140 mg grams per milliliter every 2 weeks.  Obesity - Patient recently started on semaglutide .  Recommended continued lifestyle modifications with diet and physical activity.  Upper extremity swelling - Patient notes recent upper extremity swelling and pain associated with long bouts of travel.  Do not suspect that this is cardiac in nature but will update echo as above.  Recommended elevation and compression stockings.  Patient should follow-up with her PCP for further evaluation if conservative management does not resolve symptoms.  Disposition: F/u with Dr. Gollan or an APP in 1 month or after testing.   Medication Adjustments/Labs and Tests Ordered: Current medicines are reviewed at length with the patient today.  Concerns regarding medicines are outlined above. Medication changes, Labs and Tests ordered today are summarized above and listed in the Patient Instructions accessible in Encounters.   Bonney Lesley Maffucci, PA-C 11/27/2023 9:38 AM     [facility] [address] [place], [place] [address] [phone]

## 2023-11-27 ENCOUNTER — Encounter: Payer: Self-pay | Admitting: Cardiology

## 2023-11-27 ENCOUNTER — Ambulatory Visit: Attending: Cardiology | Admitting: Physician Assistant

## 2023-11-27 ENCOUNTER — Other Ambulatory Visit (HOSPITAL_COMMUNITY): Payer: Self-pay

## 2023-11-27 ENCOUNTER — Telehealth: Payer: Self-pay | Admitting: Pharmacy Technician

## 2023-11-27 VITALS — BP 108/74 | HR 88 | Ht 63.0 in | Wt 247.8 lb

## 2023-11-27 DIAGNOSIS — R2233 Localized swelling, mass and lump, upper limb, bilateral: Secondary | ICD-10-CM | POA: Insufficient documentation

## 2023-11-27 DIAGNOSIS — I2510 Atherosclerotic heart disease of native coronary artery without angina pectoris: Secondary | ICD-10-CM | POA: Diagnosis not present

## 2023-11-27 DIAGNOSIS — E782 Mixed hyperlipidemia: Secondary | ICD-10-CM | POA: Diagnosis not present

## 2023-11-27 DIAGNOSIS — T466X5A Adverse effect of antihyperlipidemic and antiarteriosclerotic drugs, initial encounter: Secondary | ICD-10-CM | POA: Diagnosis not present

## 2023-11-27 DIAGNOSIS — T466X5D Adverse effect of antihyperlipidemic and antiarteriosclerotic drugs, subsequent encounter: Secondary | ICD-10-CM

## 2023-11-27 DIAGNOSIS — Z6841 Body Mass Index (BMI) 40.0 and over, adult: Secondary | ICD-10-CM | POA: Diagnosis not present

## 2023-11-27 DIAGNOSIS — M791 Myalgia, unspecified site: Secondary | ICD-10-CM | POA: Insufficient documentation

## 2023-11-27 DIAGNOSIS — E66813 Obesity, class 3: Secondary | ICD-10-CM | POA: Diagnosis not present

## 2023-11-27 DIAGNOSIS — Z9861 Coronary angioplasty status: Secondary | ICD-10-CM | POA: Diagnosis not present

## 2023-11-27 DIAGNOSIS — Z7689 Persons encountering health services in other specified circumstances: Secondary | ICD-10-CM | POA: Diagnosis not present

## 2023-11-27 MED ORDER — EZETIMIBE 10 MG PO TABS: 10.0000 mg | ORAL_TABLET | Freq: Every day | ORAL | 3 refills | Status: DC

## 2023-11-27 MED ORDER — REPATHA SURECLICK 140 MG/ML ~~LOC~~ SOAJ
140.0000 mg | SUBCUTANEOUS | 6 refills | Status: AC
Start: 2023-11-27 — End: ?

## 2023-11-27 NOTE — Patient Instructions (Signed)
 Medication Instructions:  Your physician recommends the following medication changes.  START TAKING: Zetia  10 mg daily Repatha  140 mg every 14 days   *If you need a refill on your cardiac medications before your next appointment, please call your pharmacy*  Lab Work: No labs ordered today  If you have labs (blood work) drawn today and your tests are completely normal, you will receive your results only by: MyChart Message (if you have MyChart) OR A paper copy in the mail If you have any lab test that is abnormal or we need to change your treatment, we will call you to review the results.  Testing/Procedures: Your physician has requested that you have an echocardiogram. Echocardiography is a painless test that uses sound waves to create images of your heart. It provides your doctor with information about the size and shape of your heart and how well your heart's chambers and valves are working.   You may receive an ultrasound enhancing agent through an IV if needed to better visualize your heart during the echo. This procedure takes approximately one hour.  There are no restrictions for this procedure.  This will take place at [address] [facility], [place] [address]  Please note: We ask at that you not bring children with you during ultrasound (echo/ vascular) testing. Due to room size and safety concerns, children are not allowed in the ultrasound rooms during exams. Our front office staff cannot provide observation of children in our lobby area while testing is being conducted. An adult accompanying a patient to their appointment will only be allowed in the ultrasound room at the discretion of the ultrasound technician under special circumstances. We apologize for any inconvenience.   Follow-Up: At [facility], you and your health needs are our priority.  As part of our continuing mission to provide you with exceptional heart care, our providers are  all part of one team.  This team includes your primary Cardiologist (physician) and Advanced Practice Providers or APPs (Physician Assistants and Nurse Practitioners) who all work together to provide you with the care you need, when you need it.  Your next appointment:   1 month(s) post ECHO  Provider:   Lesley Maffucci, PA-C

## 2023-11-27 NOTE — Telephone Encounter (Signed)
 Pharmacy Patient Advocate Encounter   Received notification from Fax that prior authorization for Repatha  is required/requested.   Insurance verification completed.   The patient is insured through [facility] [place] .   Per test claim: PA required; PA submitted to above mentioned insurance via CoverMyMeds Key/confirmation #/EOC [id number] Status is pending

## 2023-11-27 NOTE — Telephone Encounter (Signed)
 Pharmacy Patient Advocate Encounter  Received notification from [facility] Medicaid that Prior Authorization for Repatha  has been APPROVED from 11/27/23 to 11/26/24. Ran test claim, Copay is $4.00- one month. This test claim was processed through [facility]- copay amounts may vary at other pharmacies due to pharmacy/plan contracts, or as the patient moves through the different stages of their insurance plan.   PA #/Case ID/Reference #: [id number]

## 2023-11-28 ENCOUNTER — Ambulatory Visit: Admitting: Physician Assistant

## 2023-12-13 DIAGNOSIS — Z419 Encounter for procedure for purposes other than remedying health state, unspecified: Secondary | ICD-10-CM | POA: Diagnosis not present

## 2023-12-16 ENCOUNTER — Ambulatory Visit: Attending: Physician Assistant

## 2023-12-16 DIAGNOSIS — I2510 Atherosclerotic heart disease of native coronary artery without angina pectoris: Secondary | ICD-10-CM | POA: Insufficient documentation

## 2023-12-16 DIAGNOSIS — Z9861 Coronary angioplasty status: Secondary | ICD-10-CM | POA: Diagnosis not present

## 2023-12-16 LAB — ECHOCARDIOGRAM COMPLETE
AR max vel: 3.23 cm2
AV Area VTI: 3.3 cm2
AV Area mean vel: 3.19 cm2
AV Mean grad: 4 mmHg
AV Peak grad: 6.9 mmHg
Ao pk vel: 1.31 m/s
S' Lateral: 2.27 cm

## 2023-12-17 ENCOUNTER — Ambulatory Visit: Payer: Self-pay | Admitting: Physician Assistant

## 2023-12-18 ENCOUNTER — Other Ambulatory Visit (HOSPITAL_COMMUNITY)
Admission: RE | Admit: 2023-12-18 | Discharge: 2023-12-18 | Disposition: A | Discharge status: Home / Self Care | Source: Ambulatory Visit | Attending: Obstetrics & Gynecology | Admitting: Obstetrics & Gynecology

## 2023-12-18 ENCOUNTER — Ambulatory Visit: Admitting: Obstetrics & Gynecology

## 2023-12-18 VITALS — BP 115/82 | HR 84 | Ht 63.0 in | Wt 247.0 lb

## 2023-12-18 DIAGNOSIS — N858 Other specified noninflammatory disorders of uterus: Secondary | ICD-10-CM | POA: Diagnosis not present

## 2023-12-18 DIAGNOSIS — Z7689 Persons encountering health services in other specified circumstances: Secondary | ICD-10-CM | POA: Diagnosis not present

## 2023-12-18 DIAGNOSIS — N939 Abnormal uterine and vaginal bleeding, unspecified: Secondary | ICD-10-CM | POA: Insufficient documentation

## 2023-12-18 MED ORDER — NORETHINDRONE ACETATE 5 MG PO TABS: 10.0000 mg | ORAL_TABLET | Freq: Every day | ORAL | 3 refills | Status: DC

## 2023-12-18 NOTE — Progress Notes (Signed)
 GYNECOLOGY OFFICE VISIT NOTE  History:  Abigail Garcia is a 53 y.o. [id number] here today for evaluation of abnormal uterine bleeding (AUB).  She was on Megace  in the past for this but stopped it. Then had no bleeding for 11 months, but since April 2025, had almost daily bleeding. Sometimes heavy, sometime lighter.  Currently spotting.  Feels she had a lot of stress around April that made this worse. She denies any abnormal vaginal discharge, pelvic pain or other concerns.  Past Medical History:  Diagnosis Date   Anemia    Blood transfusion without reported diagnosis    CAD (coronary artery disease)    a. 11/2012 NSTEMI/Cath/PCI: LM nl, LAD 80-90 thrombotic (tx with Heparin  x 2 days then aspiration thrombectomy and PTCA), RI nl, LCX sm/nl, RCA nl, EF 55-65%.   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 Echo (6/14) with EF 55-60%.    Hypertension    Menorrhagia    NSTEMI (non-ST elevated myocardial infarction) (HCC)    11/2012    Obesity    RA (rheumatoid arthritis) (HCC)     Past Surgical History:  Procedure Laterality Date   bilateral tubal     BREAST BIOPSY Left    benign   CARDIAC CATHETERIZATION  06/03/2012   CESAREAN SECTION     CORONARY ANGIOGRAM  11/19/2012   Procedure: CORONARY ANGIOGRAM;  Surgeon: Peter M Swaziland, MD;  Location: [facility] CATH LAB;  Service: Cardiovascular;;   DILATION AND CURETTAGE OF UTERUS     DILATION AND CURETTAGE OF UTERUS N/A 03/13/2019   Procedure: DILATATION AND CURETTAGE;  Surgeon: Starla Harland BROCKS, MD;  Location: [facility] OR;  Service: Gynecology;  Laterality: N/A;   INTRAVASCULAR ULTRASOUND  11/19/2012   Procedure: INTRAVASCULAR ULTRASOUND;  Surgeon: Peter M Swaziland, MD;  Location: [facility] CATH LAB;  Service: Cardiovascular;;   LEFT HEART CATHETERIZATION WITH CORONARY ANGIOGRAM N/A 11/16/2012   Procedure: LEFT HEART CATHETERIZATION WITH CORONARY ANGIOGRAM;   Surgeon: Peter M Swaziland, MD;  Location: [facility] CATH LAB;  Service: Cardiovascular;  Laterality: N/A;   PERCUTANEOUS CORONARY INTERVENTION-BALLOON ONLY  11/19/2012   Procedure: PERCUTANEOUS CORONARY INTERVENTION-BALLOON ONLY;  Surgeon: Peter M Swaziland, MD;  Location: [facility] CATH LAB;  Service: Cardiovascular;;    The following portions of the patient's history were reviewed and updated as appropriate: allergies, current medications, past family history, past medical history, past social history, past surgical history and problem list.   Health Maintenance:  ASCUS pap and positive HRHPV on 06/10/2023, colposcopy on 07/09/2023 showed low grade cervical dysplasia.  Normal mammogram on 11/11/2023.   Review of Systems:  Pertinent items noted in HPI and remainder of comprehensive ROS otherwise negative.  Physical Exam:  BP 115/82   Pulse 84   Ht 5' 3 (1.6 m)   Wt 247 lb (112 kg)   BMI 43.75 kg/m  CONSTITUTIONAL: Well-developed, well-nourished female in no acute distress.  HEENT:  Normocephalic, atraumatic. External right and left ear normal. No scleral icterus.  NECK: Normal range of motion, supple, no masses noted on observation SKIN: No rash noted. Not diaphoretic. No erythema. No pallor. MUSCULOSKELETAL: Normal range of motion. No edema noted. NEUROLOGIC: Alert and oriented to person, place, and time. Normal muscle tone coordination. No cranial nerve deficit noted on observation. PSYCHIATRIC: Normal mood and affect. Normal behavior. Normal judgment and thought content. CARDIOVASCULAR:  Normal heart rate noted RESPIRATORY: Effort and breath sounds normal, no problems with respiration noted ABDOMEN: No masses or other overt distention noted on observation. No tenderness.   PELVIC: Normal appearing external genitalia; normal urethral meatus; normal appearing vaginal mucosa and cervix.  Scant light brown discharge noted.  Normal uterine size, no other palpable masses, no uterine or adnexal tenderness.  Performed in the presence of a chaperone  ENDOMETRIAL BIOPSY     The indications for endometrial biopsy were reviewed.   Risks of the biopsy including cramping, bleeding, infection, uterine perforation, inadequate specimen and need for additional procedures  were discussed. The patient states she understands and agrees to undergo procedure today. Consent was signed. Time out was performed.  Chaperone was present during entire procedure. During the pelvic exam, the cervix was prepped with Betadine.  The 3 mm pipelle was introduced into the endometrial cavity without difficulty to a depth of 11 cm, and a moderate amount of tissue was obtained after three passes and sent to pathology. The instruments were removed from the patient's vagina. Minimal bleeding from the cervix was noted. The patient tolerated the procedure well. Routine post-procedure instructions were given to the patient.     Assessment and Plan:    1. Abnormal uterine bleeding (AUB) (Primary) Bleeding could be due to hormonal imbalances, perimenopausal state and exacerbated by stress. Endometrial biopsy done, will follow up results and manage accordingly. Labs and pelvic ultrasound ordered, will follow up results and manage accordingly. For now, Aygestin  ordered to help with bleeding. Precautions reviewed.  - CBC - TSH Rfx on Abnormal to Free T4 - FSH/LH - Beta hCG quant (ref lab) - Ferritin - Surgical pathology - US  PELVIC COMPLETE WITH TRANSVAGINAL; Future - norethindrone  (AYGESTIN ) 5 MG tablet; Take 2 tablets (10 mg total) by mouth daily.  Dispense: 60 tablet; Refill: 3  Routine preventative health maintenance measures emphasized. Please refer to After Visit Summary for other counseling recommendations.   Return for follow up as recommended.    I spent 30 minutes dedicated to the care of this patient including pre-visit review of records, face to face time with the patient discussing her conditions and treatments, post visit  ordering of medications and appropriate tests or procedures, coordinating care and documenting this visit encounter.    GLORIS HUGGER, MD, FACOG Obstetrician & Gynecologist, [facility] for [employer], [facility]

## 2023-12-18 NOTE — Patient Instructions (Signed)
ENDOMETRIAL BIOPSY POST-PROCEDURE INSTRUCTIONS  You may take Ibuprofen, Aleve or Tylenol for pain if needed.  Cramping should resolve within in 24 hours.  You may have a small amount of spotting.  You should wear a mini pad for the next few days.  You may have intercourse after 24 hours.  You need to call if you have any pelvic pain, fever, heavy bleeding or foul smelling vaginal discharge.  Shower or bathe as normal  6. We will call you within one week with results

## 2023-12-19 ENCOUNTER — Ambulatory Visit: Payer: Self-pay | Admitting: Obstetrics & Gynecology

## 2023-12-19 DIAGNOSIS — N939 Abnormal uterine and vaginal bleeding, unspecified: Secondary | ICD-10-CM

## 2023-12-19 LAB — FSH/LH
FSH: 8 m[IU]/mL
LH: 13.4 m[IU]/mL

## 2023-12-19 LAB — CBC
Hematocrit: 40.4 % (ref 34.0–46.6)
Hemoglobin: 12.4 g/dL (ref 11.1–15.9)
MCH: 24.5 pg — ABNORMAL LOW (ref 26.6–33.0)
MCHC: 30.7 g/dL — ABNORMAL LOW (ref 31.5–35.7)
MCV: 80 fL (ref 79–97)
Platelets: 488 x10E3/uL — ABNORMAL HIGH (ref 150–450)
RBC: 5.06 x10E6/uL (ref 3.77–5.28)
RDW: 14.8 % (ref 11.7–15.4)
WBC: 10.3 x10E3/uL (ref 3.4–10.8)

## 2023-12-19 LAB — TSH RFX ON ABNORMAL TO FREE T4: TSH: 1.5 u[IU]/mL (ref 0.450–4.500)

## 2023-12-19 LAB — BETA HCG QUANT (REF LAB): hCG Quant: <1 m[IU]/mL

## 2023-12-19 LAB — SURGICAL PATHOLOGY

## 2023-12-19 LAB — FERRITIN: Ferritin: 8 ng/mL — ABNORMAL LOW (ref 15–150)

## 2023-12-22 ENCOUNTER — Ambulatory Visit: Admitting: Family Medicine

## 2023-12-24 ENCOUNTER — Ambulatory Visit (HOSPITAL_BASED_OUTPATIENT_CLINIC_OR_DEPARTMENT_OTHER): Attending: Family Medicine | Admitting: Physical Therapy

## 2023-12-24 ENCOUNTER — Ambulatory Visit: Admitting: Family Medicine

## 2023-12-24 ENCOUNTER — Encounter (HOSPITAL_BASED_OUTPATIENT_CLINIC_OR_DEPARTMENT_OTHER): Payer: Self-pay | Admitting: Physical Therapy

## 2023-12-24 ENCOUNTER — Other Ambulatory Visit: Payer: Self-pay

## 2023-12-24 DIAGNOSIS — M0579 Rheumatoid arthritis with rheumatoid factor of multiple sites without organ or systems involvement: Secondary | ICD-10-CM | POA: Diagnosis not present

## 2023-12-24 DIAGNOSIS — Z7689 Persons encountering health services in other specified circumstances: Secondary | ICD-10-CM | POA: Diagnosis not present

## 2023-12-24 DIAGNOSIS — M25561 Pain in right knee: Secondary | ICD-10-CM | POA: Insufficient documentation

## 2023-12-24 DIAGNOSIS — M25552 Pain in left hip: Secondary | ICD-10-CM | POA: Insufficient documentation

## 2023-12-24 DIAGNOSIS — M25562 Pain in left knee: Secondary | ICD-10-CM | POA: Diagnosis not present

## 2023-12-24 DIAGNOSIS — M6281 Muscle weakness (generalized): Secondary | ICD-10-CM | POA: Insufficient documentation

## 2023-12-24 DIAGNOSIS — G8929 Other chronic pain: Secondary | ICD-10-CM | POA: Diagnosis not present

## 2023-12-24 DIAGNOSIS — Z6841 Body Mass Index (BMI) 40.0 and over, adult: Secondary | ICD-10-CM | POA: Diagnosis not present

## 2023-12-24 DIAGNOSIS — M25551 Pain in right hip: Secondary | ICD-10-CM | POA: Diagnosis not present

## 2023-12-24 DIAGNOSIS — E66813 Obesity, class 3: Secondary | ICD-10-CM | POA: Diagnosis not present

## 2023-12-24 NOTE — Therapy (Signed)
 OUTPATIENT PHYSICAL THERAPY THORACOLUMBAR EVALUATION   Patient Name: Abigail Garcia MRN: [id number] DOB:[birth year], 53 y.o., female Today's Date: 12/24/2023  END OF SESSION:  PT End of Session - 12/24/23 1336     Visit Number 1    Date for PT Re-Evaluation 02/20/24    Authorization Type [employer] mcaid    PT Start Time 0935    PT Stop Time 1015    PT Time Calculation (min) 40 min    Activity Tolerance Patient tolerated treatment well          Past Medical History:  Diagnosis Date   Anemia    Blood transfusion without reported diagnosis    CAD (coronary artery disease)    a. 11/2012 NSTEMI/Cath/PCI: LM nl, LAD 80-90 thrombotic (tx with Heparin  x 2 days then aspiration thrombectomy and PTCA), RI nl, LCX sm/nl, RCA nl, EF 55-65%.   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 Echo (6/14) with EF 55-60%.    Hypertension    Menorrhagia    NSTEMI (non-ST elevated myocardial infarction) (HCC)    11/2012    Obesity    RA (rheumatoid arthritis) (HCC)    Past Surgical History:  Procedure Laterality Date   bilateral tubal     BREAST BIOPSY Left    benign   CARDIAC CATHETERIZATION  06/03/2012   CESAREAN SECTION     CORONARY ANGIOGRAM  11/19/2012   Procedure: CORONARY ANGIOGRAM;  Surgeon: Peter M Swaziland, MD;  Location: [facility] CATH LAB;  Service: Cardiovascular;;   DILATION AND CURETTAGE OF UTERUS     DILATION AND CURETTAGE OF UTERUS N/A 03/13/2019   Procedure: DILATATION AND CURETTAGE;  Surgeon: Starla Harland BROCKS, MD;  Location: [facility] OR;  Service: Gynecology;  Laterality: N/A;   INTRAVASCULAR ULTRASOUND  11/19/2012   Procedure: INTRAVASCULAR ULTRASOUND;  Surgeon: Peter M Swaziland, MD;  Location: [facility] CATH LAB;  Service: Cardiovascular;;   LEFT HEART CATHETERIZATION WITH CORONARY ANGIOGRAM N/A 11/16/2012   Procedure: LEFT HEART CATHETERIZATION WITH CORONARY ANGIOGRAM;  Surgeon: Peter M Swaziland,  MD;  Location: [facility] CATH LAB;  Service: Cardiovascular;  Laterality: N/A;   PERCUTANEOUS CORONARY INTERVENTION-BALLOON ONLY  11/19/2012   Procedure: PERCUTANEOUS CORONARY INTERVENTION-BALLOON ONLY;  Surgeon: Peter M Swaziland, MD;  Location: [facility] CATH LAB;  Service: Cardiovascular;;   Patient Active Problem List   Diagnosis Date Noted   Dysplasia of cervix, low grade (CIN 1) 07/10/2023   ASCUS with positive high risk HPV on pap 06/10/2023 06/13/2023   Pressure in head 04/08/2023   COVID-19 vaccine series declined 10/22/2022   Dysfunctional uterine bleeding 10/22/2022   Epidermal inclusion cyst 10/22/2022   Healthcare maintenance 10/22/2022   Epidermal cyst 04/24/2021   Symptomatic anemia 03/12/2019   Hirsutism 04/17/2017   Prediabetes 08/18/2015   Blurry vision, bilateral 08/18/2015   Abnormal uterine bleeding (AUB) 08/11/2015   Obesity 11/11/2013   Thrombocytosis 08/31/2013   Ganglion cyst of wrist 06/12/2013   Hyperlipidemia 12/03/2012   CAD S/P percutaneous coronary angioplasty 11/30/2012   Rheumatoid arthritis involving multiple sites with positive rheumatoid factor (HCC) 11/14/2012   Anemia, iron  deficiency 11/14/2012    PCP: Sharma Coyer, MD   REFERRING PROVIDER: Sharma Coyer, MD   REFERRING DIAG: M05.79 (ICD-10-CM) - Rheumatoid arthritis involving multiple sites with positive rheumatoid factor (HCC)   Rationale for Evaluation and Treatment: Rehabilitation  THERAPY DIAG:  Muscle weakness (generalized)  Chronic pain of both hips  Chronic pain of both knees  ONSET DATE: chronic  SUBJECTIVE:                                                                                                                                                                                           SUBJECTIVE STATEMENT: I am on new med from Md that does something to my muscles and my legs are weak. Pain mostly lower body.  Also in core and wrists and elbows. I am here  because my legs are weak.  PERTINENT HISTORY:  Request for water aerobics to help with RA, joint pains, requesting 2-4 times weekly sessions to help relieve joint pain and reduce weight, current BMI 44.46   PAIN:  Are you having pain? Yes: NPRS scale: current 2/10; worst 5/10; least 0/10 Pain location: >lower body joints Pain description: ache Aggravating factors: stress physical and mental, walking 15 minutes Relieving factors: rest, music; gardening  PRECAUTIONS: None  RED FLAGS: None   WEIGHT BEARING RESTRICTIONS: No  FALLS:  Has patient fallen in last 6 months? No  LIVING ENVIRONMENT: Lives with: lives with their family Lives in: House/apartment Stairs: Yes: Internal: 16 steps; on right going up Has following equipment at home: None  OCCUPATION: not working  PLOF: Independent  PATIENT GOALS: strengthening of le  NEXT MD VISIT: as needed  OBJECTIVE:  Note: Objective measures were completed at Evaluation unless otherwise noted.  DIAGNOSTIC FINDINGS:  None recent in chart  PATIENT SURVEYS:  LEFS  Extreme difficulty/unable (0), Quite a bit of difficulty (1), Moderate difficulty (2), Little difficulty (3), No difficulty (4) Survey date:  12/24/23  Any of your usual work, housework or school activities 2  2. Usual hobbies, recreational or sporting activities 3  3. Getting into/out of the bath 2  4. Walking between rooms 3  5. Putting on socks/shoes 2  6. Squatting  1  7. Lifting an object, like a bag of groceries from the floor 2  8. Performing light activities around your home 2  9. Performing heavy activities around your home 1  10. Getting into/out of a car 2  11. Walking 2 blocks 2  12. Walking 1 mile 1  13. Going up/down 10 stairs (1 flight) 2  14. Standing for 1 hour 1  15.  sitting for 1 hour 3  16. Running on even ground 1  17. Running on uneven ground 1  18. Making sharp turns while running fast 1  19. Hopping  0  20. Rolling over in bed 2   Score total:  34/56  COGNITION: Overall cognitive status: Within functional limits for tasks assessed     SENSATION: WFL  MUSCLE LENGTH: Hamstrings: Left leg tightness tested in sitting   POSTURE: rounded shoulders and increased lumbar lordosis   LUMBAR ROM:   AROM eval  Flexion FT to below patella  Extension neutral  Right lateral flexion wfl  Left lateral flexion wfl  Right rotation   Left rotation    (Blank rows = not tested)  LOWER EXTREMITY ROM:     wfl  LOWER EXTREMITY MMT:    HD Lbs in sitting Right eval Left eval  Hip flexion 35.0 17.8  Hip extension    Hip abduction 10.7 9.8  Hip adduction    Hip internal rotation    Hip external rotation    Knee flexion    Knee extension 11.9 12.7  Ankle dorsiflexion    Ankle plantarflexion    Ankle inversion    Ankle eversion     (Blank rows = not tested)  LUMBAR SPECIAL TESTS:  Slump test: Negative  FUNCTIONAL TESTS:  5 times sit to stand: from pool bench 25s Timed up and go (TUG): 16.29  4 stage balance: Passed 1&2. Tandem stance 3-4s; SLS unable GAIT: Distance walked: 400 ft Assistive device utilized: None Level of assistance: Complete Independence Comments: slight core rotation with increased arm swing (for momentum)  TREATMENT  Eval Self care:Posture and body mechanic instruction; instruction on importance of continuing with HEP for long term management of chronic pain. Use of AD; resting. Pool access sheet issued.                                                                                                                                PATIENT EDUCATION:  Education details: Discussed eval findings, rehab rationale, aquatic program progression/POC and pools in area. Patient is in agreement  Person educated: Patient Education method: Explanation Education comprehension: verbalized understanding  HOME EXERCISE PROGRAM: Pt will bring laminated copy from last session.  Unable to find it  in [employer] today. Plan to up-date/modify as appropriate.  ASSESSMENT:  CLINICAL IMPRESSION: Patient is a 53 y.o. f who was seen today for physical therapy evaluation and treatment for Joint pain due to RA. She is well known to this clinic as she has completed a prior episode x 1 year ago for similar dx.  She presents today with reports of increased LE weakness due to new med Md has put her on. She was unable to gain pool access after episode last year to continue with aquatic HEP. Today she demonstrates le weakness limiting strength , endurance, activity tolerance, gait, balance, and functional mobility with ADL's. Her pain can be variable and does limit some function but less than le weakness.  She will benefit from skilled aquatic PT to improve all areas of deficits, encourage pt to gain personal pool access with understanding that chronic condition will require consistent management for optimal ability/function.  OBJECTIVE IMPAIRMENTS: Abnormal gait, decreased activity tolerance, decreased balance, decreased endurance, decreased knowledge of condition, decreased knowledge of use of DME, decreased mobility, difficulty walking, decreased ROM, decreased strength, hypomobility, increased edema, impaired perceived functional ability, increased muscle spasms, impaired flexibility, impaired UE functional use, improper body mechanics, postural dysfunction, obesity, and pain.    ACTIVITY LIMITATIONS: carrying, lifting, bending, standing, squatting, stairs, transfers, bed mobility, dressing, reach over head, hygiene/grooming, and locomotion level   PARTICIPATION LIMITATIONS: interpersonal relationship, community activity, occupation, and   difficulty moving around, doing her daily tasks such as working, shopping, going up and down stairs, using her hands, dancing, spending time with grandchildren, getting up and down from the floor, keeping her balance   PERSONAL FACTORS: Fitness, Past/current  experiences, Profession, Time since onset of injury/illness/exacerbation, Transportation, and 3+ comorbidities:   anemia, CAD, esophagitis, menorrhagia, NSTEMI (and cardiac cath 2014), active severe Stage 3 rheumatoid arthritis with positive rheumatoid factor are also affecting patient's functional outcome.    REHAB POTENTIAL: Good   CLINICAL DECISION MAKING: Evolving/moderate complexity   EVALUATION COMPLEXITY: Moderate   GOALS: Goals reviewed with patient? Yes  SHORT TERM GOALS: Target date: 01/11/24  Pt will tolerate full aquatic sessions consistently without increase in pain and with improving function to demonstrate good toleration and effectiveness of intervention.  Baseline:TBA Goal status: INITIAL  2.  Pt to gain pool access for indep completion of aquatic HEP Baseline: list of pool hand out issued Goal status: INITIAL  3.  Pt will improve lumbar flex to FT to mid calf for reduced tightness in post core. Baseline: see chart Goal status: INITIAL   LONG TERM GOALS: Target date: 02/20/24  Pt to improve on LEFS by at least 9 point to demonstrate statistically significant Improvement in function. Baseline: 34/56 Goal status: INITIAL  2.  Pt will report decrease in pain by at least 50% for improved toleration to activity/quality of life and to demonstrate improved management of pain. Baseline:  Goal status: INITIAL  3.  Pt will improve strength in all areas listed by at least 10 lbs to demonstrate improved overall physical function Baseline:  Goal status: INITIAL  4.  Pt will be indep with final aquatic HEP for continued management of condition Baseline:  Goal status: INITIAL  5.  Pt will perform SLS unsupported x 10s to demonstrate and improvement in balance as well as LE strength Baseline:  Goal status: INITIAL  6.  Pt will improve on Tug test to <or= 13.5s (13.5=community dwelling adults) to demonstrate improvement in lower extremity function, mobility and  decreased fall risk. Baseline: 16.29 Goal status: INITIAL  PLAN:  PT FREQUENCY: 1-2x/week  PT DURATION: 8 weeks  PLANNED INTERVENTIONS: 97164- PT Re-evaluation, 97750- Physical Performance Testing, 97110-Therapeutic exercises, 97530- Therapeutic activity, [id number]- Neuromuscular re-education, 97535- Self Care, [id number]- Manual therapy, [id number]- Gait training, [phone]- Aquatic Therapy, [phone]- Electrical stimulation (manual), [id number]- Ionotophoresis 4mg /ml Dexamethasone , 79439 (1-2 muscles), 20561 (3+ muscles)- Dry Needling, Patient/Family education, Balance training, Stair training, Taping, Joint mobilization, DME instructions, Cryotherapy, and Moist heat.  PLAN FOR NEXT SESSION: aquatics for [employer] and core strengthening (focus on quad str); balance and proprioceptive retraining; activity toleration   Ronal Foots) Niomi Valent MPT 12/24/23 4:20 PM [facility] [address] [place], [place], [address] Phone: [phone]   Fax:  [phone]  For all possible CPT codes, reference the Planned Interventions line above.     Check all conditions that are expected to impact treatment: {Conditions expected to impact treatment:Morbid obesity  and Musculoskeletal disorders   If treatment provided at initial evaluation, no treatment charged due to lack of authorization.

## 2023-12-26 ENCOUNTER — Ambulatory Visit
Admission: RE | Admit: 2023-12-26 | Discharge: 2023-12-26 | Disposition: A | Discharge status: Home / Self Care | Source: Ambulatory Visit | Attending: Obstetrics & Gynecology | Admitting: Obstetrics & Gynecology

## 2023-12-26 DIAGNOSIS — N852 Hypertrophy of uterus: Secondary | ICD-10-CM | POA: Diagnosis not present

## 2023-12-26 DIAGNOSIS — N939 Abnormal uterine and vaginal bleeding, unspecified: Secondary | ICD-10-CM | POA: Insufficient documentation

## 2023-12-26 DIAGNOSIS — Z7689 Persons encountering health services in other specified circumstances: Secondary | ICD-10-CM | POA: Diagnosis not present

## 2023-12-26 DIAGNOSIS — D259 Leiomyoma of uterus, unspecified: Secondary | ICD-10-CM | POA: Diagnosis not present

## 2023-12-26 DIAGNOSIS — N83202 Unspecified ovarian cyst, left side: Secondary | ICD-10-CM | POA: Diagnosis not present

## 2023-12-29 ENCOUNTER — Ambulatory Visit (HOSPITAL_BASED_OUTPATIENT_CLINIC_OR_DEPARTMENT_OTHER): Payer: Self-pay | Admitting: Physical Therapy

## 2024-01-01 ENCOUNTER — Encounter: Payer: Self-pay | Admitting: Cardiology

## 2024-01-01 ENCOUNTER — Ambulatory Visit: Attending: Cardiology | Admitting: Cardiology

## 2024-01-01 VITALS — BP 115/80 | HR 80 | Ht 63.0 in | Wt 244.4 lb

## 2024-01-01 DIAGNOSIS — Z6841 Body Mass Index (BMI) 40.0 and over, adult: Secondary | ICD-10-CM | POA: Diagnosis not present

## 2024-01-01 DIAGNOSIS — E66813 Obesity, class 3: Secondary | ICD-10-CM | POA: Diagnosis not present

## 2024-01-01 DIAGNOSIS — I2510 Atherosclerotic heart disease of native coronary artery without angina pectoris: Secondary | ICD-10-CM | POA: Insufficient documentation

## 2024-01-01 DIAGNOSIS — Z79899 Other long term (current) drug therapy: Secondary | ICD-10-CM | POA: Insufficient documentation

## 2024-01-01 DIAGNOSIS — Z9861 Coronary angioplasty status: Secondary | ICD-10-CM | POA: Diagnosis not present

## 2024-01-01 DIAGNOSIS — E782 Mixed hyperlipidemia: Secondary | ICD-10-CM | POA: Insufficient documentation

## 2024-01-01 DIAGNOSIS — R2233 Localized swelling, mass and lump, upper limb, bilateral: Secondary | ICD-10-CM | POA: Insufficient documentation

## 2024-01-01 DIAGNOSIS — Z7689 Persons encountering health services in other specified circumstances: Secondary | ICD-10-CM | POA: Diagnosis not present

## 2024-01-01 NOTE — Progress Notes (Signed)
 Cardiology Office Note   Date:  01/01/2024  ID:  Abigail Garcia, DOB 06/15/1970, MRN [id number] PCP: Sharma Coyer, MD  [facility] Providers Cardiologist:  None     History of Present Illness Abigail Garcia is a 53 y.o. female with a past medical history of coronary disease status post NSTEMI (11/2012), hypertension, hyperlipidemia, obesity, rheumatoid arthritis who presents today for follow-up of coronary artery disease and hyperlipidemia.   Patient with history of coronary disease status post NSTEMI in 11/2012 with thrombus noted to the mid LAD.  She underwent aspiration thrombectomy and PTCA.  Echocardiogram revealed LVEF 55 to 60%, G1 DD.  She was treated with clopidogrel  until this was discontinued 01/2014.  GXT in 08/2013 for atypical chest pain showed poor exercise tolerance but no ischemic EKG changes.  She was evaluated in clinic by Dr. Gollan in 11/2019 overall doing well from a cardiac perspective.  She reported intolerance to Lipitor  and Crestor .  She was started on Zetia  and Repatha .  However Repatha  PA was denied.  She requested calcium  score for restratification however this does not appear was completed.   She was last seen in clinic 11/27/2023 send she was doing well from a cardiac perspective.  She was send symptoms of angina or cardiac decompensation.  She reports she been traveling more frequently in the past several months.  When she travels whether we have a plane or car she notes swelling in her upper extremities near her antecubital region which is somewhat painful.  She was scheduled for an updated echocardiogram and encouraged to follow-up with her PCP.  She returns clinic today stating overall she has been doing well from a cardiac perspective.  She states she continues to have some swelling to her upper extremities when traveling but had improved as she was wearing compression garments to her arms.  She denies any chest pain, shortness of breath, lightheadedness  or dizziness.  States that she has been compliant with her current medication regimen and is awaiting for approval for Repatha  for cholesterol.  She denies any hospitalizations or visits to the emergency department.  ROS: 10 point review of systems has been reviewed and considered negative the exception was been listed in the HPI  Studies Reviewed     2D echo 12/16/2023 1. Left ventricular ejection fraction, by estimation, is 60 to 65%. The  left ventricle has normal function. The left ventricle has no regional  wall motion abnormalities. Left ventricular diastolic parameters are  indeterminate. The average left  ventricular global longitudinal strain is -18.7 %. The global longitudinal  strain is normal.   2. Right ventricular systolic function is normal. The right ventricular  size is normal. There is normal pulmonary artery systolic pressure. The  estimated right ventricular systolic pressure is 25.6 mmHg.   3. The mitral valve is normal in structure. Mild mitral valve  regurgitation. No evidence of mitral stenosis.   4. The aortic valve has an indeterminant number of cusps. Aortic valve  regurgitation is not visualized. No aortic stenosis is present.   5. The inferior vena cava is normal in size with greater than 50%  respiratory variability, suggesting right atrial pressure of 3 mmHg.  Risk Assessment/Calculations           Physical Exam VS:  BP 115/80 (BP Location: Left Arm, Patient Position: Sitting, Cuff Size: Normal)   Pulse 80   Ht 5' 3 (1.6 m)   Wt 244 lb 6.4 oz (110.9 kg)   SpO2 100%  BMI 43.29 kg/m        Wt Readings from Last 3 Encounters:  01/01/24 244 lb 6.4 oz (110.9 kg)  12/18/23 247 lb (112 kg)  11/27/23 247 lb 12.8 oz (112.4 kg)    GEN: Well nourished, well developed in no acute distress NECK: No JVD; No carotid bruits CARDIAC: RRR, no murmurs, rubs, gallops RESPIRATORY:  Clear to auscultation without rales, wheezing or rhonchi  ABDOMEN: Soft,  non-tender, non-distended EXTREMITIES:  No edema; No deformity   ASSESSMENT AND PLAN Coronary artery disease status post NSTEMI with PTCA in 11/2012 with an echo at that time showed normal EF.  She continues to be without anginal or anginal complaints.  She is continued on aspirin  81 mg daily metoprolol  tartrate 12.5 mg twice daily.  She is continued on ezetimibe  and waiting for Repatha  to start.  Recent updated echocardiogram revealed LVEF 60-65% with mild mitral regurgitation.  No other ischemic workup needed at this time.  Hyperlipidemia with an LDL of 191.  She has been intolerant to atorvastatin  and rosuvastatin  due to nausea.  She reports that she does not take her Zetia  regularly.  She is awaiting Repatha  approval and has been advised to call the pharmacy as it appears that insurance of approved it will be 1 injection every 2 weeks 140 mg.  She has been scheduled for an updated lipid and hepatic panel 6 weeks after starting the medication and encouraged to continue with the ezetimibe  since she was without the side effects.  Obesity with a BMI of 43.29.  She has recently been started on Wegovy .  She is down 3 pounds.  Ongoing management per PCP.  Swelling to the bilateral upper extremities during long bouts of travel either in an airplane or by car, that she notes has improved with compression therapy.        Dispo: Patient to return to clinic to see MD/APP in 3 to 4 months or sooner if needed for further evaluation.  Signed, Furious Chiarelli, NP

## 2024-01-01 NOTE — Patient Instructions (Signed)
 Medication Instructions:  Your physician recommends that you continue on your current medications as directed. Please refer to the Current Medication list given to you today.   *If you need a refill on your cardiac medications before your next appointment, please call your pharmacy*  Lab Work: Your provider would like for you to return in 2 months to have the following labs drawn: Lipid, Hepatic.   Please go to [facility] [address] ([facility] [address], [place] [address] You do not need an appointment.  They are open from 8 am- 4:30 pm.  Lunch from 1:00 pm- 2:00 pm You DO need to be fasting.   You may also go to one of the following [facility]:  [address] [place], [place] [address] Phone: [phone] Lab hours: Mon-Fri 8 am- 5 pm    Lunch 12 pm- 1 pm  [address] [place],  [place]  [address]  [place]  Phone: [phone] Lab hours: 7 am- 4 pm Lunch 12 pm-1 pm   [address] [place],  [place]  [address]  [place]  Phone: [phone] Lab hours: Mon-Fri 8 am- 5 pm    Lunch 12 pm- 1 pm  If you have labs (blood work) drawn today and your tests are completely normal, you will receive your results only by: MyChart Message (if you have MyChart) OR A paper copy in the mail If you have any lab test that is abnormal or we need to change your treatment, we will call you to review the results.  Testing/Procedures: No test ordered today   Follow-Up: At [facility], you and your health needs are our priority.  As part of our continuing mission to provide you with exceptional heart care, our providers are all part of one team.  This team includes your primary Cardiologist (physician) and Advanced Practice Providers or APPs (Physician Assistants and Nurse Practitioners) who all work together to provide you with the care you need, when you need it.  Your next appointment:   3 - 4 month(s)  Provider:   Tylene Lunch, NP

## 2024-01-02 ENCOUNTER — Encounter (HOSPITAL_BASED_OUTPATIENT_CLINIC_OR_DEPARTMENT_OTHER): Payer: Self-pay | Admitting: Physical Therapy

## 2024-01-02 ENCOUNTER — Ambulatory Visit (HOSPITAL_BASED_OUTPATIENT_CLINIC_OR_DEPARTMENT_OTHER): Payer: Self-pay | Attending: Family Medicine | Admitting: Physical Therapy

## 2024-01-02 DIAGNOSIS — M25552 Pain in left hip: Secondary | ICD-10-CM | POA: Insufficient documentation

## 2024-01-02 DIAGNOSIS — M6281 Muscle weakness (generalized): Secondary | ICD-10-CM | POA: Diagnosis not present

## 2024-01-02 DIAGNOSIS — M25561 Pain in right knee: Secondary | ICD-10-CM | POA: Diagnosis not present

## 2024-01-02 DIAGNOSIS — M25562 Pain in left knee: Secondary | ICD-10-CM | POA: Diagnosis not present

## 2024-01-02 DIAGNOSIS — G8929 Other chronic pain: Secondary | ICD-10-CM | POA: Diagnosis not present

## 2024-01-02 DIAGNOSIS — M25551 Pain in right hip: Secondary | ICD-10-CM | POA: Diagnosis not present

## 2024-01-02 DIAGNOSIS — Z7689 Persons encountering health services in other specified circumstances: Secondary | ICD-10-CM | POA: Diagnosis not present

## 2024-01-02 NOTE — Therapy (Signed)
 OUTPATIENT PHYSICAL THERAPY THORACOLUMBAR TREATMENT   Patient Name: Abigail Garcia MRN: [id number] DOB:05/05/1971, 53 y.o., female Today's Date: 01/02/2024  END OF SESSION:  PT End of Session - 01/02/24 1610     Visit Number 2    Date for PT Re-Evaluation 02/20/24    Authorization Type [employer] mcaid    Authorization Time Period 12/24/23-02/22/24    Authorization - Visit Number 2    Authorization - Number of Visits 10    PT Start Time 1515    PT Stop Time 1558    PT Time Calculation (min) 43 min    Activity Tolerance Patient tolerated treatment well    Behavior During Therapy WFL for tasks assessed/performed           Past Medical History:  Diagnosis Date   Anemia    Blood transfusion without reported diagnosis    CAD (coronary artery disease)    a. 11/2012 NSTEMI/Cath/PCI: LM nl, LAD 80-90 thrombotic (tx with Heparin  x 2 days then aspiration thrombectomy and PTCA), RI nl, LCX sm/nl, RCA nl, EF 55-65%.   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 Echo (6/14) with EF 55-60%.    Hypertension    Menorrhagia    NSTEMI (non-ST elevated myocardial infarction) (HCC)    11/2012    Obesity    RA (rheumatoid arthritis) (HCC)    Past Surgical History:  Procedure Laterality Date   bilateral tubal     BREAST BIOPSY Left    benign   CARDIAC CATHETERIZATION  06/03/2012   CESAREAN SECTION     CORONARY ANGIOGRAM  11/19/2012   Procedure: CORONARY ANGIOGRAM;  Surgeon: Peter M Swaziland, MD;  Location: [facility] CATH LAB;  Service: Cardiovascular;;   DILATION AND CURETTAGE OF UTERUS     DILATION AND CURETTAGE OF UTERUS N/A 03/13/2019   Procedure: DILATATION AND CURETTAGE;  Surgeon: Starla Harland BROCKS, MD;  Location: [facility] OR;  Service: Gynecology;  Laterality: N/A;   INTRAVASCULAR ULTRASOUND  11/19/2012   Procedure: INTRAVASCULAR ULTRASOUND;  Surgeon: Peter M Swaziland, MD;  Location: [facility] CATH LAB;   Service: Cardiovascular;;   LEFT HEART CATHETERIZATION WITH CORONARY ANGIOGRAM N/A 11/16/2012   Procedure: LEFT HEART CATHETERIZATION WITH CORONARY ANGIOGRAM;  Surgeon: Peter M Swaziland, MD;  Location: [facility] CATH LAB;  Service: Cardiovascular;  Laterality: N/A;   PERCUTANEOUS CORONARY INTERVENTION-BALLOON ONLY  11/19/2012   Procedure: PERCUTANEOUS CORONARY INTERVENTION-BALLOON ONLY;  Surgeon: Peter M Swaziland, MD;  Location: [facility] CATH LAB;  Service: Cardiovascular;;   Patient Active Problem List   Diagnosis Date Noted   Dysplasia of cervix, low grade (CIN 1) 07/10/2023   ASCUS with positive high risk HPV on pap 06/10/2023 06/13/2023   Pressure in head 04/08/2023   COVID-19 vaccine series declined 10/22/2022   Dysfunctional uterine bleeding 10/22/2022   Epidermal inclusion cyst 10/22/2022   Healthcare maintenance 10/22/2022   Epidermal cyst 04/24/2021   Symptomatic anemia 03/12/2019   Hirsutism 04/17/2017   Prediabetes 08/18/2015   Blurry vision, bilateral 08/18/2015   Abnormal uterine bleeding (AUB) 08/11/2015   Obesity 11/11/2013   Thrombocytosis 08/31/2013   Ganglion cyst of wrist 06/12/2013   Hyperlipidemia 12/03/2012   CAD S/P percutaneous coronary angioplasty 11/30/2012   Rheumatoid arthritis involving multiple sites with positive rheumatoid factor (HCC) 11/14/2012   Anemia, iron  deficiency 11/14/2012    PCP: Sharma Coyer, MD  REFERRING PROVIDER: Sharma Coyer, MD   REFERRING DIAG: M05.79 (ICD-10-CM) - Rheumatoid arthritis involving multiple sites with positive rheumatoid factor (HCC)   Rationale for Evaluation and Treatment: Rehabilitation  THERAPY DIAG:  Muscle weakness (generalized)  Chronic pain of both hips  Chronic pain of both knees  ONSET DATE: chronic  SUBJECTIVE:                                                                                                                                                                                            SUBJECTIVE STATEMENT: Pt reports that her core hurts today. Pt reports she had biopsy at OB/gyn last week.   POOL ACCESS: currently none, but plans to [facility] out [facility]  PERTINENT HISTORY:  Request for water aerobics to help with RA, joint pains, requesting 2-4 times weekly sessions to help relieve joint pain and reduce weight, current BMI 44.46   PAIN:  Are you having pain? Yes: NPRS scale:  5/10 Pain location: core and Rt ankle Pain description: ache, sore Aggravating factors: stress physical and mental, walking 15 minutes Relieving factors: rest, music; gardening  PRECAUTIONS: None  RED FLAGS: None   WEIGHT BEARING RESTRICTIONS: No  FALLS:  Has patient fallen in last 6 months? No  LIVING ENVIRONMENT: Lives with: lives with their family Lives in: House/apartment Stairs: Yes: Internal: 16 steps; on right going up Has following equipment at home: None  OCCUPATION: not working  PLOF: Independent  PATIENT GOALS: strengthening of le  NEXT MD VISIT: as needed  OBJECTIVE:  Note: Objective measures were completed at Evaluation unless otherwise noted.  DIAGNOSTIC FINDINGS:  None recent in chart  PATIENT SURVEYS:  LEFS  Extreme difficulty/unable (0), Quite a bit of difficulty (1), Moderate difficulty (2), Little difficulty (3), No difficulty (4) Survey date:  12/24/23  Any of your usual work, housework or school activities 2  2. Usual hobbies, recreational or sporting activities 3  3. Getting into/out of the bath 2  4. Walking between rooms 3  5. Putting on socks/shoes 2  6. Squatting  1  7. Lifting an object, like a bag of groceries from the floor 2  8. Performing light activities around your home 2  9. Performing heavy activities around your home 1  10. Getting into/out of a car 2  11. Walking 2 blocks 2  12. Walking 1 mile 1  13. Going up/down 10 stairs (1 flight) 2  14. Standing for 1 hour 1  15.  sitting for 1 hour 3  16. Running  on even ground 1  17. Running on uneven ground 1  18. Making  sharp turns while running fast 1  19. Hopping  0  20. Rolling over in bed 2  Score total:  34/56     COGNITION: Overall cognitive status: Within functional limits for tasks assessed     SENSATION: WFL  MUSCLE LENGTH: Hamstrings: Left leg tightness tested in sitting   POSTURE: rounded shoulders and increased lumbar lordosis   LUMBAR ROM:   AROM eval  Flexion FT to below patella  Extension neutral  Right lateral flexion wfl  Left lateral flexion wfl  Right rotation   Left rotation    (Blank rows = not tested)  LOWER EXTREMITY ROM:     wfl  LOWER EXTREMITY MMT:    HD Lbs in sitting Right eval Left eval  Hip flexion 35.0 17.8  Hip extension    Hip abduction 10.7 9.8  Hip adduction    Hip internal rotation    Hip external rotation    Knee flexion    Knee extension 11.9 12.7  Ankle dorsiflexion    Ankle plantarflexion    Ankle inversion    Ankle eversion     (Blank rows = not tested)  LUMBAR SPECIAL TESTS:  Slump test: Negative  FUNCTIONAL TESTS:  5 times sit to stand: from pool bench 25s Timed up and go (TUG): 16.29  4 stage balance: Passed 1&2. Tandem stance 3-4s; SLS unable GAIT: Distance walked: 400 ft Assistive device utilized: None Level of assistance: Complete Independence Comments: slight core rotation with increased arm swing (for momentum)  TREATMENT  Pt seen for aquatic therapy today.  Treatment took place in water 3.5-4.75 ft in depth at the [facility] pool. Temp of water was 91.  Pt entered/exited the pool via stairs independently in step-to pattern with bilat rail.  * holding barbell:  multiple laps walking forward / backward - cues for vertical trunk and heel/toe, toe/ heel pattern * holding barbell:  side stepping L/R -> with arm add/abdct -> with rainbow hand floats  * marching forward/ backward with row motion with rainbow hand floats  * UE on wall: hip abdct/  addct 3 x5; heel/toe raises x 10; hamstring curls, alternating LEs x 5 each; relaxed squat for short rest * TrA set with half hollow noodle pull to thighs 2 x 5 * return to walking forward / backward with arm swing   * straddling noodle and UE on wall:  cycling (intermittent CGA to assist pt from prone float) * plank with UE on bench in water for abdominal stretch/ neutral spine  PATIENT EDUCATION:  Education details: reacquainting to aquatic therapy  Person educated: Patient Education method: Explanation Education comprehension: verbalized understanding  HOME EXERCISE PROGRAM: Pt will bring laminated copy from last session.  Unable to find it in [employer] today. Plan to up-date/modify as appropriate.  ASSESSMENT:  CLINICAL IMPRESSION: Pt demonstrates safety and independence in aquatic setting with therapist instructing from deck. Pt is know to clinic from previous episode of care.  She demonstrates improved confidence in setting, moving up to 4 ft without difficulty. Pt reported overall reduction of pain during session. Difficulty balance when suspended on noodle between LEs; requires UE on wall and occasional CGA to maintain vertical position.  Will progress as tolerated.  Goals are ongoing.      From initial evaluation:  Patient is a 53 y.o. f who was seen today for physical therapy evaluation and treatment for Joint pain due to RA. She is well known to this clinic as she has completed a  prior episode x 1 year ago for similar dx.  She presents today with reports of increased LE weakness due to new med Md has put her on. She was unable to gain pool access after episode last year to continue with aquatic HEP. Today she demonstrates le weakness limiting strength , endurance, activity tolerance, gait, balance, and functional mobility with ADL's. Her pain can be variable and does limit some function but less than le weakness.  She will benefit from skilled aquatic PT to improve all areas of  deficits, encourage pt to gain personal pool access with understanding that chronic condition will require consistent management for optimal ability/function.    OBJECTIVE IMPAIRMENTS: Abnormal gait, decreased activity tolerance, decreased balance, decreased endurance, decreased knowledge of condition, decreased knowledge of use of DME, decreased mobility, difficulty walking, decreased ROM, decreased strength, hypomobility, increased edema, impaired perceived functional ability, increased muscle spasms, impaired flexibility, impaired UE functional use, improper body mechanics, postural dysfunction, obesity, and pain.    ACTIVITY LIMITATIONS: carrying, lifting, bending, standing, squatting, stairs, transfers, bed mobility, dressing, reach over head, hygiene/grooming, and locomotion level   PARTICIPATION LIMITATIONS: interpersonal relationship, community activity, occupation, and   difficulty moving around, doing her daily tasks such as working, shopping, going up and down stairs, using her hands, dancing, spending time with grandchildren, getting up and down from the floor, keeping her balance   PERSONAL FACTORS: Fitness, Past/current experiences, Profession, Time since onset of injury/illness/exacerbation, Transportation, and 3+ comorbidities:   anemia, CAD, esophagitis, menorrhagia, NSTEMI (and cardiac cath 2014), active severe Stage 3 rheumatoid arthritis with positive rheumatoid factor are also affecting patient's functional outcome.    REHAB POTENTIAL: Good   CLINICAL DECISION MAKING: Evolving/moderate complexity   EVALUATION COMPLEXITY: Moderate   GOALS: Goals reviewed with patient? Yes  SHORT TERM GOALS: Target date: 01/11/24  Pt will tolerate full aquatic sessions consistently without increase in pain and with improving function to demonstrate good toleration and effectiveness of intervention.  Baseline:TBA Goal status: INITIAL  2.  Pt to gain pool access for indep completion of  aquatic HEP Baseline: list of pool hand out issued Goal status: INITIAL  3.  Pt will improve lumbar flex to FT to mid calf for reduced tightness in post core. Baseline: see chart Goal status: INITIAL   LONG TERM GOALS: Target date: 02/20/24  Pt to improve on LEFS by at least 9 point to demonstrate statistically significant Improvement in function. Baseline: 34/56 Goal status: INITIAL  2.  Pt will report decrease in pain by at least 50% for improved toleration to activity/quality of life and to demonstrate improved management of pain. Baseline:  Goal status: INITIAL  3.  Pt will improve strength in all areas listed by at least 10 lbs to demonstrate improved overall physical function Baseline:  Goal status: INITIAL  4.  Pt will be indep with final aquatic HEP for continued management of condition Baseline:  Goal status: INITIAL  5.  Pt will perform SLS unsupported x 10s to demonstrate and improvement in balance as well as LE strength Baseline:  Goal status: INITIAL  6.  Pt will improve on Tug test to <or= 13.5s (13.5=community dwelling adults) to demonstrate improvement in lower extremity function, mobility and decreased fall risk. Baseline: 16.29 Goal status: INITIAL  PLAN:  PT FREQUENCY: 1-2x/week  PT DURATION: 8 weeks  PLANNED INTERVENTIONS: 97164- PT Re-evaluation, 97750- Physical Performance Testing, 97110-Therapeutic exercises, 97530- Therapeutic activity, [id number]- Neuromuscular re-education, 97535- Self Care, 02859- Manual therapy, [id number]- Gait training, [phone]- Aquatic  Therapy, [id number]- Electrical stimulation (manual), 02966- Ionotophoresis 4mg /ml Dexamethasone , 20560 (1-2 muscles), 20561 (3+ muscles)- Dry Needling, Patient/Family education, Balance training, Stair training, Taping, Joint mobilization, DME instructions, Cryotherapy, and Moist heat.  PLAN FOR NEXT SESSION: aquatics for le and core strengthening (focus on quad str); balance and proprioceptive retraining;  activity toleration  Delon Aquas, PTA 01/02/24 4:17 PM [facility] [address] [place], [place], [address] Phone: [phone]   Fax:  [phone]    For all possible CPT codes, reference the Planned Interventions line above.     Check all conditions that are expected to impact treatment: {Conditions expected to impact treatment:Morbid obesity and Musculoskeletal disorders   If treatment provided at initial evaluation, no treatment charged due to lack of authorization.

## 2024-01-05 ENCOUNTER — Encounter: Payer: Self-pay | Admitting: Family Medicine

## 2024-01-06 ENCOUNTER — Telehealth: Payer: Self-pay

## 2024-01-06 ENCOUNTER — Encounter (HOSPITAL_BASED_OUTPATIENT_CLINIC_OR_DEPARTMENT_OTHER): Payer: Self-pay | Admitting: Physical Therapy

## 2024-01-06 ENCOUNTER — Ambulatory Visit (HOSPITAL_BASED_OUTPATIENT_CLINIC_OR_DEPARTMENT_OTHER): Payer: Self-pay | Admitting: Physical Therapy

## 2024-01-06 DIAGNOSIS — R5383 Other fatigue: Secondary | ICD-10-CM

## 2024-01-06 DIAGNOSIS — G8929 Other chronic pain: Secondary | ICD-10-CM

## 2024-01-06 DIAGNOSIS — M25552 Pain in left hip: Secondary | ICD-10-CM | POA: Diagnosis not present

## 2024-01-06 DIAGNOSIS — M6281 Muscle weakness (generalized): Secondary | ICD-10-CM

## 2024-01-06 DIAGNOSIS — R7303 Prediabetes: Secondary | ICD-10-CM

## 2024-01-06 DIAGNOSIS — M25561 Pain in right knee: Secondary | ICD-10-CM | POA: Diagnosis not present

## 2024-01-06 DIAGNOSIS — Z7689 Persons encountering health services in other specified circumstances: Secondary | ICD-10-CM | POA: Diagnosis not present

## 2024-01-06 DIAGNOSIS — E66813 Obesity, class 3: Secondary | ICD-10-CM

## 2024-01-06 DIAGNOSIS — M25551 Pain in right hip: Secondary | ICD-10-CM | POA: Diagnosis not present

## 2024-01-06 DIAGNOSIS — M25562 Pain in left knee: Secondary | ICD-10-CM | POA: Diagnosis not present

## 2024-01-06 NOTE — Telephone Encounter (Signed)
 Dr. Lang pt, please advice. Pt states she thinks she needs to go up on her Semaglutide , please advise. Not on med list but was last dispense 11/11/23 but wants a refill. LOV 10/22/23 NOV 02/04/24 LRF 11/11/23 qty:24mL r:0 LABS 09/09/23 A1c 5.7

## 2024-01-06 NOTE — Telephone Encounter (Signed)
 Copied from CRM #[id number]. Topic: Clinical - Medication Question >> Jan 06, 2024  3:56 PM [place] MATSU wrote: Reason for CRM:  Patient called in to advise that she took her last injection of the Semaglutide -Weight Management 0.25 MG/0.5ML SOAJ [[id number]] and would like to know if it will be refilled or if her dosage changing or what is the next step.  Patient is requesting a call back to further discuss.   CB: [phone]  May you please

## 2024-01-06 NOTE — Telephone Encounter (Signed)
 Ok to increase to 0.5 mg weekly. Send in 1 month supply with 0 refills and can discuss next steps with PCP at appt in September

## 2024-01-06 NOTE — Therapy (Signed)
 OUTPATIENT PHYSICAL THERAPY THORACOLUMBAR TREATMENT   Patient Name: Abigail Garcia MRN: [id number] DOB:12-21-1970, 53 y.o., female Today's Date: 01/06/2024  END OF SESSION:  PT End of Session - 01/06/24 1018     Visit Number 3    Date for PT Re-Evaluation 02/20/24    Authorization Type [employer] mcaid    Authorization Time Period 12/24/23-02/22/24    Authorization - Visit Number 3    Authorization - Number of Visits 10    PT Start Time 1016    PT Stop Time 1057    PT Time Calculation (min) 41 min    Activity Tolerance Patient tolerated treatment well    Behavior During Therapy WFL for tasks assessed/performed           Past Medical History:  Diagnosis Date   Anemia    Blood transfusion without reported diagnosis    CAD (coronary artery disease)    a. 11/2012 NSTEMI/Cath/PCI: LM nl, LAD 80-90 thrombotic (tx with Heparin  x 2 days then aspiration thrombectomy and PTCA), RI nl, LCX sm/nl, RCA nl, EF 55-65%.   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 Echo (6/14) with EF 55-60%.    Hypertension    Menorrhagia    NSTEMI (non-ST elevated myocardial infarction) (HCC)    11/2012    Obesity    RA (rheumatoid arthritis) (HCC)    Past Surgical History:  Procedure Laterality Date   bilateral tubal     BREAST BIOPSY Left    benign   CARDIAC CATHETERIZATION  06/03/2012   CESAREAN SECTION     CORONARY ANGIOGRAM  11/19/2012   Procedure: CORONARY ANGIOGRAM;  Surgeon: Peter M Swaziland, MD;  Location: [facility] CATH LAB;  Service: Cardiovascular;;   DILATION AND CURETTAGE OF UTERUS     DILATION AND CURETTAGE OF UTERUS N/A 03/13/2019   Procedure: DILATATION AND CURETTAGE;  Surgeon: Starla Harland BROCKS, MD;  Location: [facility] OR;  Service: Gynecology;  Laterality: N/A;   INTRAVASCULAR ULTRASOUND  11/19/2012   Procedure: INTRAVASCULAR ULTRASOUND;  Surgeon: Peter M Swaziland, MD;  Location: [facility] CATH LAB;   Service: Cardiovascular;;   LEFT HEART CATHETERIZATION WITH CORONARY ANGIOGRAM N/A 11/16/2012   Procedure: LEFT HEART CATHETERIZATION WITH CORONARY ANGIOGRAM;  Surgeon: Peter M Swaziland, MD;  Location: [facility] CATH LAB;  Service: Cardiovascular;  Laterality: N/A;   PERCUTANEOUS CORONARY INTERVENTION-BALLOON ONLY  11/19/2012   Procedure: PERCUTANEOUS CORONARY INTERVENTION-BALLOON ONLY;  Surgeon: Peter M Swaziland, MD;  Location: [facility] CATH LAB;  Service: Cardiovascular;;   Patient Active Problem List   Diagnosis Date Noted   Dysplasia of cervix, low grade (CIN 1) 07/10/2023   ASCUS with positive high risk HPV on pap 06/10/2023 06/13/2023   Pressure in head 04/08/2023   COVID-19 vaccine series declined 10/22/2022   Dysfunctional uterine bleeding 10/22/2022   Epidermal inclusion cyst 10/22/2022   Healthcare maintenance 10/22/2022   Epidermal cyst 04/24/2021   Symptomatic anemia 03/12/2019   Hirsutism 04/17/2017   Prediabetes 08/18/2015   Blurry vision, bilateral 08/18/2015   Abnormal uterine bleeding (AUB) 08/11/2015   Obesity 11/11/2013   Thrombocytosis 08/31/2013   Ganglion cyst of wrist 06/12/2013   Hyperlipidemia 12/03/2012   CAD S/P percutaneous coronary angioplasty 11/30/2012   Rheumatoid arthritis involving multiple sites with positive rheumatoid factor (HCC) 11/14/2012   Anemia, iron  deficiency 11/14/2012    PCP: Sharma Coyer, MD  REFERRING PROVIDER: Sharma Coyer, MD   REFERRING DIAG: M05.79 (ICD-10-CM) - Rheumatoid arthritis involving multiple sites with positive rheumatoid factor (HCC)   Rationale for Evaluation and Treatment: Rehabilitation  THERAPY DIAG:  Muscle weakness (generalized)  Chronic pain of both hips  Chronic pain of both knees  ONSET DATE: chronic  SUBJECTIVE:                                                                                                                                                                                            SUBJECTIVE STATEMENT: Pt reports slightly elevated pain in joints due to weather 5-6/10   POOL ACCESS: currently none, but plans to [facility] out [facility]  PERTINENT HISTORY:  Request for water aerobics to help with RA, joint pains, requesting 2-4 times weekly sessions to help relieve joint pain and reduce weight, current BMI 44.46   PAIN:  Are you having pain? Yes: NPRS scale:  5/10 Pain location: core and Rt ankle Pain description: ache, sore Aggravating factors: stress physical and mental, walking 15 minutes Relieving factors: rest, music; gardening  PRECAUTIONS: None  RED FLAGS: None   WEIGHT BEARING RESTRICTIONS: No  FALLS:  Has patient fallen in last 6 months? No  LIVING ENVIRONMENT: Lives with: lives with their family Lives in: House/apartment Stairs: Yes: Internal: 16 steps; on right going up Has following equipment at home: None  OCCUPATION: not working  PLOF: Independent  PATIENT GOALS: strengthening of le  NEXT MD VISIT: as needed  OBJECTIVE:  Note: Objective measures were completed at Evaluation unless otherwise noted.  DIAGNOSTIC FINDINGS:  None recent in chart  PATIENT SURVEYS:  LEFS  Extreme difficulty/unable (0), Quite a bit of difficulty (1), Moderate difficulty (2), Little difficulty (3), No difficulty (4) Survey date:  12/24/23  Any of your usual work, housework or school activities 2  2. Usual hobbies, recreational or sporting activities 3  3. Getting into/out of the bath 2  4. Walking between rooms 3  5. Putting on socks/shoes 2  6. Squatting  1  7. Lifting an object, like a bag of groceries from the floor 2  8. Performing light activities around your home 2  9. Performing heavy activities around your home 1  10. Getting into/out of a car 2  11. Walking 2 blocks 2  12. Walking 1 mile 1  13. Going up/down 10 stairs (1 flight) 2  14. Standing for 1 hour 1  15.  sitting for 1 hour 3  16. Running on even ground 1   17. Running on uneven ground 1  18. Making sharp turns while running fast  1  19. Hopping  0  20. Rolling over in bed 2  Score total:  34/56     COGNITION: Overall cognitive status: Within functional limits for tasks assessed     SENSATION: WFL  MUSCLE LENGTH: Hamstrings: Left leg tightness tested in sitting   POSTURE: rounded shoulders and increased lumbar lordosis   LUMBAR ROM:   AROM eval  Flexion FT to below patella  Extension neutral  Right lateral flexion wfl  Left lateral flexion wfl  Right rotation   Left rotation    (Blank rows = not tested)  LOWER EXTREMITY ROM:     wfl  LOWER EXTREMITY MMT:    HD Lbs in sitting Right eval Left eval  Hip flexion 35.0 17.8  Hip extension    Hip abduction 10.7 9.8  Hip adduction    Hip internal rotation    Hip external rotation    Knee flexion    Knee extension 11.9 12.7  Ankle dorsiflexion    Ankle plantarflexion    Ankle inversion    Ankle eversion     (Blank rows = not tested)  LUMBAR SPECIAL TESTS:  Slump test: Negative  FUNCTIONAL TESTS:  5 times sit to stand: from pool bench 25s Timed up and go (TUG): 16.29  4 stage balance: Passed 1&2. Tandem stance 3-4s; SLS unable GAIT: Distance walked: 400 ft Assistive device utilized: None Level of assistance: Complete Independence Comments: slight core rotation with increased arm swing (for momentum)  TREATMENT  01/06/24 Pt seen for aquatic therapy today.  Treatment took place in water 3.5-4.75 ft in depth at the [facility] pool. Temp of water was 91.  Pt entered/exited the pool via stairs independently in step-to pattern with bilat rail.  * Unsupported:  multiple laps walking forward / backward - cues for vertical trunk and heel/toe, toe/ heel pattern * unsupported:  side stepping L/R -> with arm add/abdct -> with rainbow hand floats  * marching forward/ backward with row motion with rainbow hand floats  * TrA set with half hollow noodle pull to  thighs 2 x 5  * UE on wall: hip abdct/ addct 3 x5; heel/toe raises x 10; hamstring curls, alternating LEs x 5 each; relaxed squat for short rest * return to walking forward / backward with arm swing  * plank with UE on bench in water for abdominal stretch/ neutral spine; hip extension x5;  fire hydrant *Standing ue support wall: SL clam 2 x 5 * noodle wrapped anteriorly across chest: ue supported on corner wall.   PATIENT EDUCATION:  Education details: reacquainting to aquatic therapy  Person educated: Patient Education method: Explanation Education comprehension: verbalized understanding  HOME EXERCISE PROGRAM: Pt will bring laminated copy from last session.  Unable to find it in [employer] today. Plan to up-date/modify as appropriate.  ASSESSMENT:  CLINICAL IMPRESSION: Pt has good response from 1st session with reports if some muscle soreness but no additional joint pain. She arrives with slightly elevated pain due to weather but does get complete relief once submerged with exercises.  She is provided vc and demonstration for execution of exercises.  She is able to maintain vertical suspension with noodle anteriorly across chest with movement into end ranges of hip ext and abd with good toleration .  Goals ongoing      From initial evaluation:  Patient is a 53 y.o. f who was seen today for physical therapy evaluation and treatment for Joint pain due to RA. She is well known  to this clinic as she has completed a prior episode x 1 year ago for similar dx.  She presents today with reports of increased LE weakness due to new med Md has put her on. She was unable to gain pool access after episode last year to continue with aquatic HEP. Today she demonstrates le weakness limiting strength , endurance, activity tolerance, gait, balance, and functional mobility with ADL's. Her pain can be variable and does limit some function but less than le weakness.  She will benefit from skilled aquatic PT to  improve all areas of deficits, encourage pt to gain personal pool access with understanding that chronic condition will require consistent management for optimal ability/function.    OBJECTIVE IMPAIRMENTS: Abnormal gait, decreased activity tolerance, decreased balance, decreased endurance, decreased knowledge of condition, decreased knowledge of use of DME, decreased mobility, difficulty walking, decreased ROM, decreased strength, hypomobility, increased edema, impaired perceived functional ability, increased muscle spasms, impaired flexibility, impaired UE functional use, improper body mechanics, postural dysfunction, obesity, and pain.    ACTIVITY LIMITATIONS: carrying, lifting, bending, standing, squatting, stairs, transfers, bed mobility, dressing, reach over head, hygiene/grooming, and locomotion level   PARTICIPATION LIMITATIONS: interpersonal relationship, community activity, occupation, and   difficulty moving around, doing her daily tasks such as working, shopping, going up and down stairs, using her hands, dancing, spending time with grandchildren, getting up and down from the floor, keeping her balance   PERSONAL FACTORS: Fitness, Past/current experiences, Profession, Time since onset of injury/illness/exacerbation, Transportation, and 3+ comorbidities:   anemia, CAD, esophagitis, menorrhagia, NSTEMI (and cardiac cath 2014), active severe Stage 3 rheumatoid arthritis with positive rheumatoid factor are also affecting patient's functional outcome.    REHAB POTENTIAL: Good   CLINICAL DECISION MAKING: Evolving/moderate complexity   EVALUATION COMPLEXITY: Moderate   GOALS: Goals reviewed with patient? Yes  SHORT TERM GOALS: Target date: 01/11/24  Pt will tolerate full aquatic sessions consistently without increase in pain and with improving function to demonstrate good toleration and effectiveness of intervention.  Baseline:TBA Goal status: INITIAL  2.  Pt to gain pool access for  indep completion of aquatic HEP Baseline: list of pool hand out issued Goal status: INITIAL  3.  Pt will improve lumbar flex to FT to mid calf for reduced tightness in post core. Baseline: see chart Goal status: INITIAL   LONG TERM GOALS: Target date: 02/20/24  Pt to improve on LEFS by at least 9 point to demonstrate statistically significant Improvement in function. Baseline: 34/56 Goal status: INITIAL  2.  Pt will report decrease in pain by at least 50% for improved toleration to activity/quality of life and to demonstrate improved management of pain. Baseline:  Goal status: INITIAL  3.  Pt will improve strength in all areas listed by at least 10 lbs to demonstrate improved overall physical function Baseline:  Goal status: INITIAL  4.  Pt will be indep with final aquatic HEP for continued management of condition Baseline:  Goal status: INITIAL  5.  Pt will perform SLS unsupported x 10s to demonstrate and improvement in balance as well as LE strength Baseline:  Goal status: INITIAL  6.  Pt will improve on Tug test to <or= 13.5s (13.5=community dwelling adults) to demonstrate improvement in lower extremity function, mobility and decreased fall risk. Baseline: 16.29 Goal status: INITIAL  PLAN:  PT FREQUENCY: 1-2x/week  PT DURATION: 8 weeks  PLANNED INTERVENTIONS: 97164- PT Re-evaluation, 97750- Physical Performance Testing, 97110-Therapeutic exercises, 97530- Therapeutic activity, [id number]- Neuromuscular re-education, 97535- Self Care,  02859- Manual therapy, [id number]- Gait training, 02886- Aquatic Therapy, [phone]- Electrical stimulation (manual), [id number]- Ionotophoresis 4mg /ml Dexamethasone , 79439 (1-2 muscles), 20561 (3+ muscles)- Dry Needling, Patient/Family education, Balance training, Stair training, Taping, Joint mobilization, DME instructions, Cryotherapy, and Moist heat.  PLAN FOR NEXT SESSION: aquatics for [employer] and core strengthening (focus on quad str); balance and  proprioceptive retraining; activity toleration  Ronal Foots) Trygve Thal MPT 01/06/24 10:55 AM [facility] [address] [place], [place], [address] Phone: [phone]   Fax:  [phone]     For all possible CPT codes, reference the Planned Interventions line above.     Check all conditions that are expected to impact treatment: {Conditions expected to impact treatment:Morbid obesity and Musculoskeletal disorders   If treatment provided at initial evaluation, no treatment charged due to lack of authorization.

## 2024-01-07 MED ORDER — SEMAGLUTIDE (1 MG/DOSE) 4 MG/3ML ~~LOC~~ SOPN: 1.0000 mg | PEN_INJECTOR | SUBCUTANEOUS | 0 refills | Status: DC

## 2024-01-07 NOTE — Telephone Encounter (Signed)
 Ok to send in 1mg  per week for 1 month supply and no refill. Med list has the 0.25mg  dose, which led to the confusion

## 2024-01-07 NOTE — Addendum Note (Signed)
 Addended by: CHERRY CHIQUITA HERO on: 01/07/2024 03:36 PM   Modules accepted: Orders

## 2024-01-07 NOTE — Telephone Encounter (Signed)
 See phone note

## 2024-01-08 ENCOUNTER — Other Ambulatory Visit: Payer: Self-pay

## 2024-01-08 ENCOUNTER — Telehealth: Payer: Self-pay

## 2024-01-08 MED ORDER — SEMAGLUTIDE-WEIGHT MANAGEMENT 1 MG/0.5ML ~~LOC~~ SOAJ: 1.0000 mg | SUBCUTANEOUS | 0 refills | Status: DC

## 2024-01-08 NOTE — Telephone Encounter (Signed)
 Copied from CRM [id number]. Topic: Clinical - Prescription Issue >> Jan 08, 2024 10:04 AM Carlatta H wrote: Reason for CRM: Patient called and stated that the wrong prescrption was called in// She stated that the Semaglutide , 1 MG/DOSE, 4 MG/3ML SOPN [[id number]] should be Wegovy  1.0//Please send a message via mychart once correct medication is sent to pharmacy

## 2024-01-08 NOTE — Telephone Encounter (Signed)
 Spoke with patient and have corrected this.

## 2024-01-13 ENCOUNTER — Telehealth: Payer: Self-pay

## 2024-01-13 ENCOUNTER — Other Ambulatory Visit (HOSPITAL_COMMUNITY): Payer: Self-pay

## 2024-01-13 ENCOUNTER — Ambulatory Visit (HOSPITAL_BASED_OUTPATIENT_CLINIC_OR_DEPARTMENT_OTHER): Admitting: Physical Therapy

## 2024-01-13 ENCOUNTER — Encounter (HOSPITAL_BASED_OUTPATIENT_CLINIC_OR_DEPARTMENT_OTHER): Payer: Self-pay | Admitting: Physical Therapy

## 2024-01-13 DIAGNOSIS — M6281 Muscle weakness (generalized): Secondary | ICD-10-CM | POA: Diagnosis not present

## 2024-01-13 DIAGNOSIS — M25562 Pain in left knee: Secondary | ICD-10-CM | POA: Diagnosis not present

## 2024-01-13 DIAGNOSIS — M25552 Pain in left hip: Secondary | ICD-10-CM | POA: Diagnosis not present

## 2024-01-13 DIAGNOSIS — G8929 Other chronic pain: Secondary | ICD-10-CM

## 2024-01-13 DIAGNOSIS — Z7689 Persons encountering health services in other specified circumstances: Secondary | ICD-10-CM | POA: Diagnosis not present

## 2024-01-13 DIAGNOSIS — Z419 Encounter for procedure for purposes other than remedying health state, unspecified: Secondary | ICD-10-CM | POA: Diagnosis not present

## 2024-01-13 DIAGNOSIS — M25561 Pain in right knee: Secondary | ICD-10-CM | POA: Diagnosis not present

## 2024-01-13 DIAGNOSIS — M25551 Pain in right hip: Secondary | ICD-10-CM | POA: Diagnosis not present

## 2024-01-13 NOTE — Telephone Encounter (Signed)
 Pharmacy Patient Advocate Encounter   Received notification from CoverMyMeds that prior authorization for Wegovy  1mg /0.66ml Pen is required/requested.   Insurance verification completed.   The patient is insured through [employer] .   Per test claim: PA required; PA submitted to above mentioned insurance via Latent Key/confirmation #/EOC [id number] Status is pending

## 2024-01-13 NOTE — Telephone Encounter (Unsigned)
 Copied from CRM [id number]. Topic: Clinical - Prescription Issue >> Jan 08, 2024 10:04 AM Carlatta H wrote: Reason for CRM: Patient called and stated that the wrong prescrption was called in// She stated that the Semaglutide , 1 MG/DOSE, 4 MG/3ML SOPN [[id number]] should be Wegovy  1.0//Please send a message via mychart once correct medication is sent to pharmacy >> Jan 13, 2024  9:56 AM [place] J wrote: Patient is calling back for an update on her wegovy . She said that the pharmacy is needing a prior authorization for the medication. The patient is concerned about how long the process has been taking in order to correct her medication and she would like some kind of clarification with everything as soon as possible. She would also like to receive updates during the prior authorization process.

## 2024-01-13 NOTE — Therapy (Signed)
 OUTPATIENT PHYSICAL THERAPY THORACOLUMBAR TREATMENT   Patient Name: Abigail Garcia MRN: [id number] DOB:01-Jul-1970, 53 y.o., female Today's Date: 01/13/2024  END OF SESSION:  PT End of Session - 01/13/24 0804     Visit Number 4    Date for PT Re-Evaluation 02/20/24    Authorization Type [employer] mcaid    Authorization Time Period 12/24/23-02/22/24    Authorization - Visit Number 4    Authorization - Number of Visits 10    Progress Note Due on Visit 10    PT Start Time 0802    PT Stop Time 0841    PT Time Calculation (min) 39 min    Activity Tolerance Patient tolerated treatment well    Behavior During Therapy [facility] for tasks assessed/performed           Past Medical History:  Diagnosis Date   Anemia    Blood transfusion without reported diagnosis    CAD (coronary artery disease)    a. 11/2012 NSTEMI/Cath/PCI: LM nl, LAD 80-90 thrombotic (tx with Heparin  x 2 days then aspiration thrombectomy and PTCA), RI nl, LCX sm/nl, RCA nl, EF 55-65%.   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 Echo (6/14) with EF 55-60%.    Hypertension    Menorrhagia    NSTEMI (non-ST elevated myocardial infarction) (HCC)    11/2012    Obesity    RA (rheumatoid arthritis) (HCC)    Past Surgical History:  Procedure Laterality Date   bilateral tubal     BREAST BIOPSY Left    benign   CARDIAC CATHETERIZATION  06/03/2012   CESAREAN SECTION     CORONARY ANGIOGRAM  11/19/2012   Procedure: CORONARY ANGIOGRAM;  Surgeon: Peter M Swaziland, MD;  Location: [facility] CATH LAB;  Service: Cardiovascular;;   DILATION AND CURETTAGE OF UTERUS     DILATION AND CURETTAGE OF UTERUS N/A 03/13/2019   Procedure: DILATATION AND CURETTAGE;  Surgeon: Starla Harland BROCKS, MD;  Location: [facility] OR;  Service: Gynecology;  Laterality: N/A;   INTRAVASCULAR ULTRASOUND  11/19/2012   Procedure: INTRAVASCULAR ULTRASOUND;  Surgeon: Peter M  Swaziland, MD;  Location: [facility] CATH LAB;  Service: Cardiovascular;;   LEFT HEART CATHETERIZATION WITH CORONARY ANGIOGRAM N/A 11/16/2012   Procedure: LEFT HEART CATHETERIZATION WITH CORONARY ANGIOGRAM;  Surgeon: Peter M Swaziland, MD;  Location: [facility] CATH LAB;  Service: Cardiovascular;  Laterality: N/A;   PERCUTANEOUS CORONARY INTERVENTION-BALLOON ONLY  11/19/2012   Procedure: PERCUTANEOUS CORONARY INTERVENTION-BALLOON ONLY;  Surgeon: Peter M Swaziland, MD;  Location: [facility] CATH LAB;  Service: Cardiovascular;;   Patient Active Problem List   Diagnosis Date Noted   Dysplasia of cervix, low grade (CIN 1) 07/10/2023   ASCUS with positive high risk HPV on pap 06/10/2023 06/13/2023   Pressure in head 04/08/2023   COVID-19 vaccine series declined 10/22/2022   Dysfunctional uterine bleeding 10/22/2022   Epidermal inclusion cyst 10/22/2022   Healthcare maintenance 10/22/2022   Epidermal cyst 04/24/2021   Symptomatic anemia 03/12/2019   Hirsutism 04/17/2017   Prediabetes 08/18/2015   Blurry vision, bilateral 08/18/2015   Abnormal uterine bleeding (AUB) 08/11/2015   Obesity 11/11/2013   Thrombocytosis 08/31/2013   Ganglion cyst of wrist 06/12/2013   Hyperlipidemia 12/03/2012   CAD S/P percutaneous coronary angioplasty 11/30/2012   Rheumatoid arthritis involving multiple sites with positive rheumatoid factor (HCC) 11/14/2012   Anemia, iron  deficiency  11/14/2012    PCP: Sharma Coyer, MD   REFERRING PROVIDER: Sharma Coyer, MD   REFERRING DIAG: M05.79 (ICD-10-CM) - Rheumatoid arthritis involving multiple sites with positive rheumatoid factor (HCC)   Rationale for Evaluation and Treatment: Rehabilitation  THERAPY DIAG:  Muscle weakness (generalized)  Chronic pain of both hips  Chronic pain of both knees  ONSET DATE: chronic  SUBJECTIVE:                                                                                                                                                                                            SUBJECTIVE STATEMENT: Pt reports good response to last session feeling better. Pain relatively low 3/10  POOL ACCESS: currently none, but plans to [facility] out [facility]  PERTINENT HISTORY:  Request for water aerobics to help with RA, joint pains, requesting 2-4 times weekly sessions to help relieve joint pain and reduce weight, current BMI 44.46   PAIN:  Are you having pain? Yes: NPRS scale:  5/10 Pain location: core and Rt ankle Pain description: ache, sore Aggravating factors: stress physical and mental, walking 15 minutes Relieving factors: rest, music; gardening  PRECAUTIONS: None  RED FLAGS: None   WEIGHT BEARING RESTRICTIONS: No  FALLS:  Has patient fallen in last 6 months? No  LIVING ENVIRONMENT: Lives with: lives with their family Lives in: House/apartment Stairs: Yes: Internal: 16 steps; on right going up Has following equipment at home: None  OCCUPATION: not working  PLOF: Independent  PATIENT GOALS: strengthening of le  NEXT MD VISIT: as needed  OBJECTIVE:  Note: Objective measures were completed at Evaluation unless otherwise noted.  DIAGNOSTIC FINDINGS:  None recent in chart  PATIENT SURVEYS:  LEFS  Extreme difficulty/unable (0), Quite a bit of difficulty (1), Moderate difficulty (2), Little difficulty (3), No difficulty (4) Survey date:  12/24/23  Any of your usual work, housework or school activities 2  2. Usual hobbies, recreational or sporting activities 3  3. Getting into/out of the bath 2  4. Walking between rooms 3  5. Putting on socks/shoes 2  6. Squatting  1  7. Lifting an object, like a bag of groceries from the floor 2  8. Performing light activities around your home 2  9. Performing heavy activities around your home 1  10. Getting into/out of a car 2  11. Walking 2 blocks 2  12. Walking 1 mile 1  13. Going up/down 10 stairs (1 flight) 2  14. Standing for 1 hour 1  15.   sitting for 1 hour 3  16. Running on even ground 1  17. Running on  uneven ground 1  18. Making sharp turns while running fast 1  19. Hopping  0  20. Rolling over in bed 2  Score total:  34/56     COGNITION: Overall cognitive status: Within functional limits for tasks assessed     SENSATION: WFL  MUSCLE LENGTH: Hamstrings: Left leg tightness tested in sitting   POSTURE: rounded shoulders and increased lumbar lordosis   LUMBAR ROM:   AROM eval  Flexion FT to below patella  Extension neutral  Right lateral flexion wfl  Left lateral flexion wfl  Right rotation   Left rotation    (Blank rows = not tested)  LOWER EXTREMITY ROM:     wfl  LOWER EXTREMITY MMT:    HD Lbs in sitting Right eval Left eval  Hip flexion 35.0 17.8  Hip extension    Hip abduction 10.7 9.8  Hip adduction    Hip internal rotation    Hip external rotation    Knee flexion    Knee extension 11.9 12.7  Ankle dorsiflexion    Ankle plantarflexion    Ankle inversion    Ankle eversion     (Blank rows = not tested)  LUMBAR SPECIAL TESTS:  Slump test: Negative  FUNCTIONAL TESTS:  5 times sit to stand: from pool bench 25s Timed up and go (TUG): 16.29  4 stage balance: Passed 1&2. Tandem stance 3-4s; SLS unable GAIT: Distance walked: 400 ft Assistive device utilized: None Level of assistance: Complete Independence Comments: slight core rotation with increased arm swing (for momentum)  TREATMENT  01/13/24 Pt seen for aquatic therapy today.  Treatment took place in water 3.5-4.75 ft in depth at the [facility] pool. Temp of water was 91.  Pt entered/exited the pool via stairs independently in step-to pattern with bilat rail.  * Unsupported:  multiple laps walking forward / backward   * unsupported:  side stepping L/R -> with arm add/abdct -> with rainbow hand floats  *Standing ue support RBHB: toe raises; heel raises *Heel walking x 2 widths; toe walking x 2 widths 3.53ft *tandem  stance ue support RBHB in 3.8 ft->ue add/abd x 5 * marching forward/ backward with kick with row motion with rainbow hand floats  * TrA set with half hollow noodle pull to thighs wide stance then staggered  x 5 * noodle wrapped anteriorly across chest: ue supported on corner wall: hip flex/ext; add/abd   PATIENT EDUCATION:  Education details: reacquainting to aquatic therapy  Person educated: Patient Education method: Explanation Education comprehension: verbalized understanding  HOME EXERCISE PROGRAM: Pt will bring laminated copy from last session.  Unable to find it in [facility] today. Plan to up-date/modify as appropriate.  ASSESSMENT:  CLINICAL IMPRESSION: Good response from previous visit.  Pain sensitivity lower today.Focus today on balance, core and proprioceptive retraining with good toleration. She is becoming less apprehensive with deeper submersion allowing for increased movement in hips towards end range for enhanced reduction in general pain.  Goals ongoing Check STG next session if time allows.       From initial evaluation:  Patient is a 53 y.o. f who was seen today for physical therapy evaluation and treatment for Joint pain due to RA. She is well known to this clinic as she has completed a prior episode x 1 year ago for similar dx.  She presents today with reports of increased LE weakness due to new med Md has put her on. She was unable to gain pool access after episode last  year to continue with aquatic HEP. Today she demonstrates le weakness limiting strength , endurance, activity tolerance, gait, balance, and functional mobility with ADL's. Her pain can be variable and does limit some function but less than le weakness.  She will benefit from skilled aquatic PT to improve all areas of deficits, encourage pt to gain personal pool access with understanding that chronic condition will require consistent management for optimal ability/function.    OBJECTIVE IMPAIRMENTS:  Abnormal gait, decreased activity tolerance, decreased balance, decreased endurance, decreased knowledge of condition, decreased knowledge of use of DME, decreased mobility, difficulty walking, decreased ROM, decreased strength, hypomobility, increased edema, impaired perceived functional ability, increased muscle spasms, impaired flexibility, impaired UE functional use, improper body mechanics, postural dysfunction, obesity, and pain.    ACTIVITY LIMITATIONS: carrying, lifting, bending, standing, squatting, stairs, transfers, bed mobility, dressing, reach over head, hygiene/grooming, and locomotion level   PARTICIPATION LIMITATIONS: interpersonal relationship, community activity, occupation, and   difficulty moving around, doing her daily tasks such as working, shopping, going up and down stairs, using her hands, dancing, spending time with grandchildren, getting up and down from the floor, keeping her balance   PERSONAL FACTORS: Fitness, Past/current experiences, Profession, Time since onset of injury/illness/exacerbation, Transportation, and 3+ comorbidities:   anemia, CAD, esophagitis, menorrhagia, NSTEMI (and cardiac cath 2014), active severe Stage 3 rheumatoid arthritis with positive rheumatoid factor are also affecting patient's functional outcome.    REHAB POTENTIAL: Good   CLINICAL DECISION MAKING: Evolving/moderate complexity   EVALUATION COMPLEXITY: Moderate   GOALS: Goals reviewed with patient? Yes  SHORT TERM GOALS: Target date: 01/11/24  Pt will tolerate full aquatic sessions consistently without increase in pain and with improving function to demonstrate good toleration and effectiveness of intervention.  Baseline:TBA Goal status:Met 01/13/24  2.  Pt to gain pool access for indep completion of aquatic HEP Baseline: list of pool hand out issued Goal status: INITIAL  3.  Pt will improve lumbar flex to FT to mid calf for reduced tightness in post core. Baseline: see chart Goal  status: INITIAL   LONG TERM GOALS: Target date: 02/20/24  Pt to improve on LEFS by at least 9 point to demonstrate statistically significant Improvement in function. Baseline: 34/56 Goal status: INITIAL  2.  Pt will report decrease in pain by at least 50% for improved toleration to activity/quality of life and to demonstrate improved management of pain. Baseline:  Goal status: INITIAL  3.  Pt will improve strength in all areas listed by at least 10 lbs to demonstrate improved overall physical function Baseline:  Goal status: INITIAL  4.  Pt will be indep with final aquatic HEP for continued management of condition Baseline:  Goal status: INITIAL  5.  Pt will perform SLS unsupported x 10s to demonstrate and improvement in balance as well as LE strength Baseline:  Goal status: INITIAL  6.  Pt will improve on Tug test to <or= 13.5s (13.5=community dwelling adults) to demonstrate improvement in lower extremity function, mobility and decreased fall risk. Baseline: 16.29 Goal status: INITIAL  PLAN:  PT FREQUENCY: 1-2x/week  PT DURATION: 8 weeks  PLANNED INTERVENTIONS: 97164- PT Re-evaluation, 97750- Physical Performance Testing, 97110-Therapeutic exercises, 97530- Therapeutic activity, [id number]- Neuromuscular re-education, 97535- Self Care, 02859- Manual therapy, [id number]- Gait training, [phone]- Aquatic Therapy, [phone]- Electrical stimulation (manual), [phone]- Ionotophoresis 4mg /ml Dexamethasone , 79439 (1-2 muscles), 20561 (3+ muscles)- Dry Needling, Patient/Family education, Balance training, Stair training, Taping, Joint mobilization, DME instructions, Cryotherapy, and Moist heat.  PLAN FOR NEXT SESSION: aquatics  for le and core strengthening (focus on quad str); balance and proprioceptive retraining; activity toleration  Ronal Foots) Graviela Nodal MPT 01/13/24 11:32 AM [facility] [address] [place], [place], [address] Phone:  [phone]   Fax:  [phone]     For all possible CPT codes, reference the Planned Interventions line above.     Check all conditions that are expected to impact treatment: {Conditions expected to impact treatment:Morbid obesity and Musculoskeletal disorders   If treatment provided at initial evaluation, no treatment charged due to lack of authorization.

## 2024-01-13 NOTE — Telephone Encounter (Signed)
 Noted

## 2024-01-13 NOTE — Telephone Encounter (Signed)
 Safe for medication to be held until she is able to get the updated supply of wegovy 

## 2024-01-13 NOTE — Telephone Encounter (Signed)
Pt has been made aware and verbalized understanding.

## 2024-01-13 NOTE — Telephone Encounter (Signed)
 PA request has been Submitted. New Encounter has been or will be created for follow up. For additional info see Pharmacy Prior Auth telephone encounter from 01/13/2024.

## 2024-01-14 ENCOUNTER — Other Ambulatory Visit (HOSPITAL_COMMUNITY): Payer: Self-pay

## 2024-01-14 NOTE — Telephone Encounter (Signed)
 Also sent patient a MyChart message.

## 2024-01-14 NOTE — Telephone Encounter (Signed)
 PA has been approved, pt should be able to fill for $4.00 a month. Thanks!

## 2024-01-14 NOTE — Telephone Encounter (Signed)
 LVM to call office back, ok if E2C2 lets her know that her PA got approved.

## 2024-01-14 NOTE — Telephone Encounter (Signed)
 Pharmacy Patient Advocate Encounter  Received notification from [facility] that Prior Authorization for Wegovy  1mg /0.46ml Pen  has been APPROVED from 01/13/2024 to 01/12/2025. Ran test claim, Copay is $4.00. This test claim was processed through [facility]- copay amounts may vary at other pharmacies due to pharmacy/plan contracts, or as the patient moves through the different stages of their insurance plan.   PA #/Case ID/Reference #: [id number]

## 2024-01-15 ENCOUNTER — Other Ambulatory Visit (HOSPITAL_COMMUNITY): Payer: Self-pay

## 2024-01-19 ENCOUNTER — Ambulatory Visit (HOSPITAL_BASED_OUTPATIENT_CLINIC_OR_DEPARTMENT_OTHER): Admitting: Physical Therapy

## 2024-01-19 ENCOUNTER — Encounter (HOSPITAL_BASED_OUTPATIENT_CLINIC_OR_DEPARTMENT_OTHER): Payer: Self-pay | Admitting: Physical Therapy

## 2024-01-19 DIAGNOSIS — M25562 Pain in left knee: Secondary | ICD-10-CM | POA: Diagnosis not present

## 2024-01-19 DIAGNOSIS — M25552 Pain in left hip: Secondary | ICD-10-CM

## 2024-01-19 DIAGNOSIS — Z7689 Persons encountering health services in other specified circumstances: Secondary | ICD-10-CM | POA: Diagnosis not present

## 2024-01-19 DIAGNOSIS — G8929 Other chronic pain: Secondary | ICD-10-CM

## 2024-01-19 DIAGNOSIS — M25561 Pain in right knee: Secondary | ICD-10-CM | POA: Diagnosis not present

## 2024-01-19 DIAGNOSIS — M25551 Pain in right hip: Secondary | ICD-10-CM | POA: Diagnosis not present

## 2024-01-19 DIAGNOSIS — M6281 Muscle weakness (generalized): Secondary | ICD-10-CM

## 2024-01-19 NOTE — Therapy (Signed)
 OUTPATIENT PHYSICAL THERAPY THORACOLUMBAR TREATMENT   Patient Name: Abigail Garcia MRN: [id number] DOB:[birth year], 53 y.o., female Today's Date: 01/19/2024  END OF SESSION:  PT End of Session - 01/19/24 0848     Visit Number 5    Date for PT Re-Evaluation 02/20/24    Authorization Type [employer] mcaid    Authorization Time Period 12/24/23-02/22/24    Authorization - Number of Visits 10    Progress Note Due on Visit 10    PT Start Time 0845    PT Stop Time 0925    PT Time Calculation (min) 40 min    Activity Tolerance Patient tolerated treatment well    Behavior During Therapy [facility] for tasks assessed/performed           Past Medical History:  Diagnosis Date   Anemia    Blood transfusion without reported diagnosis    CAD (coronary artery disease)    a. 11/2012 NSTEMI/Cath/PCI: LM nl, LAD 80-90 thrombotic (tx with Heparin  x 2 days then aspiration thrombectomy and PTCA), RI nl, LCX sm/nl, RCA nl, EF 55-65%.   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 Echo (6/14) with EF 55-60%.    Hypertension    Menorrhagia    NSTEMI (non-ST elevated myocardial infarction) (HCC)    11/2012    Obesity    RA (rheumatoid arthritis) (HCC)    Past Surgical History:  Procedure Laterality Date   bilateral tubal     BREAST BIOPSY Left    benign   CARDIAC CATHETERIZATION  06/03/2012   CESAREAN SECTION     CORONARY ANGIOGRAM  11/19/2012   Procedure: CORONARY ANGIOGRAM;  Surgeon: Peter M Swaziland, MD;  Location: [facility] CATH LAB;  Service: Cardiovascular;;   DILATION AND CURETTAGE OF UTERUS     DILATION AND CURETTAGE OF UTERUS N/A 03/13/2019   Procedure: DILATATION AND CURETTAGE;  Surgeon: Starla Harland BROCKS, MD;  Location: [facility] OR;  Service: Gynecology;  Laterality: N/A;   INTRAVASCULAR ULTRASOUND  11/19/2012   Procedure: INTRAVASCULAR ULTRASOUND;  Surgeon: Peter M Swaziland, MD;  Location: [facility] CATH LAB;   Service: Cardiovascular;;   LEFT HEART CATHETERIZATION WITH CORONARY ANGIOGRAM N/A 11/16/2012   Procedure: LEFT HEART CATHETERIZATION WITH CORONARY ANGIOGRAM;  Surgeon: Peter M Swaziland, MD;  Location: [facility] CATH LAB;  Service: Cardiovascular;  Laterality: N/A;   PERCUTANEOUS CORONARY INTERVENTION-BALLOON ONLY  11/19/2012   Procedure: PERCUTANEOUS CORONARY INTERVENTION-BALLOON ONLY;  Surgeon: Peter M Swaziland, MD;  Location: [facility] CATH LAB;  Service: Cardiovascular;;   Patient Active Problem List   Diagnosis Date Noted   Dysplasia of cervix, low grade (CIN 1) 07/10/2023   ASCUS with positive high risk HPV on pap 06/10/2023 06/13/2023   Pressure in head 04/08/2023   COVID-19 vaccine series declined 10/22/2022   Dysfunctional uterine bleeding 10/22/2022   Epidermal inclusion cyst 10/22/2022   Healthcare maintenance 10/22/2022   Epidermal cyst 04/24/2021   Symptomatic anemia 03/12/2019   Hirsutism 04/17/2017   Prediabetes 08/18/2015   Blurry vision, bilateral 08/18/2015   Abnormal uterine bleeding (AUB) 08/11/2015   Obesity 11/11/2013   Thrombocytosis 08/31/2013   Ganglion cyst of wrist 06/12/2013   Hyperlipidemia 12/03/2012   CAD S/P percutaneous coronary angioplasty 11/30/2012   Rheumatoid arthritis involving multiple sites with positive rheumatoid factor (HCC) 11/14/2012   Anemia, iron  deficiency 11/14/2012    PCP: Sharma Coyer, MD  REFERRING PROVIDER: Sharma Coyer, MD   REFERRING DIAG: M05.79 (ICD-10-CM) - Rheumatoid arthritis involving multiple sites with positive rheumatoid factor (HCC)   Rationale for Evaluation and Treatment: Rehabilitation  THERAPY DIAG:  Muscle weakness (generalized)  Chronic pain of both hips  Chronic pain of both knees  ONSET DATE: chronic  SUBJECTIVE:                                                                                                                                                                                            SUBJECTIVE STATEMENT: Pt reports 2 funerals over weekend increased stress.  Pain mostly right side joints 5/10.  POOL ACCESS: currently none, but plans to check out [facility]  PERTINENT HISTORY:  Request for water aerobics to help with RA, joint pains, requesting 2-4 times weekly sessions to help relieve joint pain and reduce weight, current BMI 44.46   PAIN:  Are you having pain? Yes: NPRS scale:  5/10 Pain location: core and Rt ankle Pain description: ache, sore Aggravating factors: stress physical and mental, walking 15 minutes Relieving factors: rest, music; gardening  PRECAUTIONS: None  RED FLAGS: None   WEIGHT BEARING RESTRICTIONS: No  FALLS:  Has patient fallen in last 6 months? No  LIVING ENVIRONMENT: Lives with: lives with their family Lives in: House/apartment Stairs: Yes: Internal: 16 steps; on right going up Has following equipment at home: None  OCCUPATION: not working  PLOF: Independent  PATIENT GOALS: strengthening of le  NEXT MD VISIT: as needed  OBJECTIVE:  Note: Objective measures were completed at Evaluation unless otherwise noted.  DIAGNOSTIC FINDINGS:  None recent in chart  PATIENT SURVEYS:  LEFS  Extreme difficulty/unable (0), Quite a bit of difficulty (1), Moderate difficulty (2), Little difficulty (3), No difficulty (4) Survey date:  12/24/23  Any of your usual work, housework or school activities 2  2. Usual hobbies, recreational or sporting activities 3  3. Getting into/out of the bath 2  4. Walking between rooms 3  5. Putting on socks/shoes 2  6. Squatting  1  7. Lifting an object, like a bag of groceries from the floor 2  8. Performing light activities around your home 2  9. Performing heavy activities around your home 1  10. Getting into/out of a car 2  11. Walking 2 blocks 2  12. Walking 1 mile 1  13. Going up/down 10 stairs (1 flight) 2  14. Standing for 1 hour 1  15.  sitting for 1 hour 3  16.  Running on even ground 1  17. Running on uneven ground 1  18. Making sharp turns  while running fast 1  19. Hopping  0  20. Rolling over in bed 2  Score total:  34/56     COGNITION: Overall cognitive status: Within functional limits for tasks assessed     SENSATION: WFL  MUSCLE LENGTH: Hamstrings: Left leg tightness tested in sitting   POSTURE: rounded shoulders and increased lumbar lordosis   LUMBAR ROM:   AROM eval 8/18  Flexion FT to below patella FT to mid calf  Extension neutral   Right lateral flexion wfl   Left lateral flexion wfl   Right rotation    Left rotation     (Blank rows = not tested)  LOWER EXTREMITY ROM:     wfl  LOWER EXTREMITY MMT:    HD Lbs in sitting Right eval Left eval  Hip flexion 35.0 17.8  Hip extension    Hip abduction 10.7 9.8  Hip adduction    Hip internal rotation    Hip external rotation    Knee flexion    Knee extension 11.9 12.7  Ankle dorsiflexion    Ankle plantarflexion    Ankle inversion    Ankle eversion     (Blank rows = not tested)  LUMBAR SPECIAL TESTS:  Slump test: Negative  FUNCTIONAL TESTS:  5 times sit to stand: from pool bench 25s Timed up and go (TUG): 16.29  4 stage balance: Passed 1&2. Tandem stance 3-4s; SLS unable GAIT: Distance walked: 400 ft Assistive device utilized: None Level of assistance: Complete Independence Comments: slight core rotation with increased arm swing (for momentum)  TREATMENT  01/19/24 Pt seen for aquatic therapy today.  Treatment took place in water 3.5-4.75 ft in depth at the [facility] pool. Temp of water was 91.  Pt entered/exited the pool via stairs independently in step-to pattern with bilat rail.  * Unsupported:  multiple laps walking forward / backward * unsupported:  side stepping L/R -> with arm add/abdct * marching forward/ backward with kick with row motion unsupported *Standing unsupported : toe raises x 15; heel raises x 15; hip add/abd x 8; HS  sets x10; hip flex/ext x 8 *step ups onto bottom step leading R/L 2 x 5 unsupported *toe walking x 2 widths 4.0.  * noodle wrapped anteriorly across chest: ue supported on corner wall: hip flex/ext; add/abd; cycling *tandem stance ue support RBHB in 3.8 ft->ue add/abd x 5            *walking forward and back between exercises for recovery.      PATIENT EDUCATION:  Education details: reacquainting to aquatic therapy  Person educated: Patient Education method: Explanation Education comprehension: verbalized understanding  HOME EXERCISE PROGRAM: Pt will bring laminated copy from last session.  Unable to find it in [employer] today. Plan to up-date/modify as appropriate.  ASSESSMENT:  CLINICAL IMPRESSION: Pt reports increase stress over w/e which increases her pain 5/10 today.  Avoided UE support on HB to increase movement patterns of Rue submerged for pain reduction in arms.  Decreased ue support allowed for increased core engagement and balance challenges which is tolerated well.  She is limited slightly by right ankle discomfort throughout exercises. Walking decreases soreness. She has not yet visited [facility] where she plans on gaining pool access but has spoken with them on the phone.  All STG met.         From initial evaluation:  Patient is a 53 y.o. f who was seen today for physical therapy evaluation and treatment for Joint pain due  to RA. She is well known to this clinic as she has completed a prior episode x 1 year ago for similar dx.  She presents today with reports of increased LE weakness due to new med Md has put her on. She was unable to gain pool access after episode last year to continue with aquatic HEP. Today she demonstrates le weakness limiting strength , endurance, activity tolerance, gait, balance, and functional mobility with ADL's. Her pain can be variable and does limit some function but less than le weakness.  She will benefit from skilled aquatic PT to  improve all areas of deficits, encourage pt to gain personal pool access with understanding that chronic condition will require consistent management for optimal ability/function.    OBJECTIVE IMPAIRMENTS: Abnormal gait, decreased activity tolerance, decreased balance, decreased endurance, decreased knowledge of condition, decreased knowledge of use of DME, decreased mobility, difficulty walking, decreased ROM, decreased strength, hypomobility, increased edema, impaired perceived functional ability, increased muscle spasms, impaired flexibility, impaired UE functional use, improper body mechanics, postural dysfunction, obesity, and pain.    ACTIVITY LIMITATIONS: carrying, lifting, bending, standing, squatting, stairs, transfers, bed mobility, dressing, reach over head, hygiene/grooming, and locomotion level   PARTICIPATION LIMITATIONS: interpersonal relationship, community activity, occupation, and   difficulty moving around, doing her daily tasks such as working, shopping, going up and down stairs, using her hands, dancing, spending time with grandchildren, getting up and down from the floor, keeping her balance   PERSONAL FACTORS: Fitness, Past/current experiences, Profession, Time since onset of injury/illness/exacerbation, Transportation, and 3+ comorbidities:   anemia, CAD, esophagitis, menorrhagia, NSTEMI (and cardiac cath 2014), active severe Stage 3 rheumatoid arthritis with positive rheumatoid factor are also affecting patient's functional outcome.    REHAB POTENTIAL: Good   CLINICAL DECISION MAKING: Evolving/moderate complexity   EVALUATION COMPLEXITY: Moderate   GOALS: Goals reviewed with patient? Yes  SHORT TERM GOALS: Target date: 01/11/24  Pt will tolerate full aquatic sessions consistently without increase in pain and with improving function to demonstrate good toleration and effectiveness of intervention.  Baseline:TBA Goal status:Met 01/13/24  2.  Pt to gain pool access for  indep completion of aquatic HEP Baseline: list of pool hand out issued Goal status: In progress 01/19/24  3.  Pt will improve lumbar flex to FT to mid calf for reduced tightness in post core. Baseline: see chart Goal status: Met 01/19/24   LONG TERM GOALS: Target date: 02/20/24  Pt to improve on LEFS by at least 9 point to demonstrate statistically significant Improvement in function. Baseline: 34/56 Goal status: INITIAL  2.  Pt will report decrease in pain by at least 50% for improved toleration to activity/quality of life and to demonstrate improved management of pain. Baseline:  Goal status: INITIAL  3.  Pt will improve strength in all areas listed by at least 10 lbs to demonstrate improved overall physical function Baseline:  Goal status: INITIAL  4.  Pt will be indep with final aquatic HEP for continued management of condition Baseline:  Goal status: INITIAL  5.  Pt will perform SLS unsupported x 10s to demonstrate and improvement in balance as well as LE strength Baseline:  Goal status: INITIAL  6.  Pt will improve on Tug test to <or= 13.5s (13.5=community dwelling adults) to demonstrate improvement in lower extremity function, mobility and decreased fall risk. Baseline: 16.29 Goal status: INITIAL  PLAN:  PT FREQUENCY: 1-2x/week  PT DURATION: 8 weeks  PLANNED INTERVENTIONS: 97164- PT Re-evaluation, 97750- Physical Performance Testing, 97110-Therapeutic exercises,  02469- Therapeutic activity, [id number]- Neuromuscular re-education, [id number]- Self Care, 02859- Manual therapy, [id number]- Gait training, [id number]- Aquatic Therapy, [phone]- Electrical stimulation (manual), [id number]- Ionotophoresis 4mg /ml Dexamethasone , 20560 (1-2 muscles), 20561 (3+ muscles)- Dry Needling, Patient/Family education, Balance training, Stair training, Taping, Joint mobilization, DME instructions, Cryotherapy, and Moist heat.  PLAN FOR NEXT SESSION: aquatics for [employer] and core strengthening (focus on quad str); balance  and proprioceptive retraining; activity toleration  Ronal Foots) Allisyn Kunz MPT 01/19/24 8:50 AM [facility] [address] [place], [place], [address] Phone: [phone]   Fax:  [phone]     For all possible CPT codes, reference the Planned Interventions line above.     Check all conditions that are expected to impact treatment: {Conditions expected to impact treatment:Morbid obesity and Musculoskeletal disorders   If treatment provided at initial evaluation, no treatment charged due to lack of authorization.

## 2024-01-22 ENCOUNTER — Ambulatory Visit (HOSPITAL_BASED_OUTPATIENT_CLINIC_OR_DEPARTMENT_OTHER): Admitting: Physical Therapy

## 2024-01-22 ENCOUNTER — Encounter (HOSPITAL_BASED_OUTPATIENT_CLINIC_OR_DEPARTMENT_OTHER): Payer: Self-pay | Admitting: Physical Therapy

## 2024-01-22 DIAGNOSIS — M25562 Pain in left knee: Secondary | ICD-10-CM | POA: Diagnosis not present

## 2024-01-22 DIAGNOSIS — M6281 Muscle weakness (generalized): Secondary | ICD-10-CM

## 2024-01-22 DIAGNOSIS — M25561 Pain in right knee: Secondary | ICD-10-CM | POA: Diagnosis not present

## 2024-01-22 DIAGNOSIS — G8929 Other chronic pain: Secondary | ICD-10-CM

## 2024-01-22 DIAGNOSIS — Z7689 Persons encountering health services in other specified circumstances: Secondary | ICD-10-CM | POA: Diagnosis not present

## 2024-01-22 DIAGNOSIS — M25551 Pain in right hip: Secondary | ICD-10-CM | POA: Diagnosis not present

## 2024-01-22 DIAGNOSIS — M25552 Pain in left hip: Secondary | ICD-10-CM | POA: Diagnosis not present

## 2024-01-22 NOTE — Therapy (Signed)
 OUTPATIENT PHYSICAL THERAPY THORACOLUMBAR TREATMENT   Patient Name: Abigail Garcia MRN: [id number] DOB:1970-08-12, 53 y.o., female Today's Date: 01/22/2024  END OF SESSION:  PT End of Session - 01/22/24 0850     Visit Number 6    Date for PT Re-Evaluation 02/20/24    Authorization Type [employer] mcaid    Authorization Time Period 12/24/23-02/22/24    Authorization - Visit Number 6    Authorization - Number of Visits 10    Progress Note Due on Visit 10    PT Start Time 0846    PT Stop Time 0926    PT Time Calculation (min) 40 min    Activity Tolerance Patient tolerated treatment well    Behavior During Therapy [facility] for tasks assessed/performed           Past Medical History:  Diagnosis Date   Anemia    Blood transfusion without reported diagnosis    CAD (coronary artery disease)    a. 11/2012 NSTEMI/Cath/PCI: LM nl, LAD 80-90 thrombotic (tx with Heparin  x 2 days then aspiration thrombectomy and PTCA), RI nl, LCX sm/nl, RCA nl, EF 55-65%.   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 Echo (6/14) with EF 55-60%.    Hypertension    Menorrhagia    NSTEMI (non-ST elevated myocardial infarction) (HCC)    11/2012    Obesity    RA (rheumatoid arthritis) (HCC)    Past Surgical History:  Procedure Laterality Date   bilateral tubal     BREAST BIOPSY Left    benign   CARDIAC CATHETERIZATION  06/03/2012   CESAREAN SECTION     CORONARY ANGIOGRAM  11/19/2012   Procedure: CORONARY ANGIOGRAM;  Surgeon: Peter M Swaziland, MD;  Location: [facility] CATH LAB;  Service: Cardiovascular;;   DILATION AND CURETTAGE OF UTERUS     DILATION AND CURETTAGE OF UTERUS N/A 03/13/2019   Procedure: DILATATION AND CURETTAGE;  Surgeon: Starla Harland BROCKS, MD;  Location: [facility] OR;  Service: Gynecology;  Laterality: N/A;   INTRAVASCULAR ULTRASOUND  11/19/2012   Procedure: INTRAVASCULAR ULTRASOUND;  Surgeon: Peter M  Swaziland, MD;  Location: [facility] CATH LAB;  Service: Cardiovascular;;   LEFT HEART CATHETERIZATION WITH CORONARY ANGIOGRAM N/A 11/16/2012   Procedure: LEFT HEART CATHETERIZATION WITH CORONARY ANGIOGRAM;  Surgeon: Peter M Swaziland, MD;  Location: [facility] CATH LAB;  Service: Cardiovascular;  Laterality: N/A;   PERCUTANEOUS CORONARY INTERVENTION-BALLOON ONLY  11/19/2012   Procedure: PERCUTANEOUS CORONARY INTERVENTION-BALLOON ONLY;  Surgeon: Peter M Swaziland, MD;  Location: [facility] CATH LAB;  Service: Cardiovascular;;   Patient Active Problem List   Diagnosis Date Noted   Dysplasia of cervix, low grade (CIN 1) 07/10/2023   ASCUS with positive high risk HPV on pap 06/10/2023 06/13/2023   Pressure in head 04/08/2023   COVID-19 vaccine series declined 10/22/2022   Dysfunctional uterine bleeding 10/22/2022   Epidermal inclusion cyst 10/22/2022   Healthcare maintenance 10/22/2022   Epidermal cyst 04/24/2021   Symptomatic anemia 03/12/2019   Hirsutism 04/17/2017   Prediabetes 08/18/2015   Blurry vision, bilateral 08/18/2015   Abnormal uterine bleeding (AUB) 08/11/2015   Obesity 11/11/2013   Thrombocytosis 08/31/2013   Ganglion cyst of wrist 06/12/2013   Hyperlipidemia 12/03/2012   CAD S/P percutaneous coronary angioplasty 11/30/2012   Rheumatoid arthritis involving multiple sites with positive rheumatoid factor (HCC) 11/14/2012   Anemia, iron  deficiency  11/14/2012    PCP: Sharma Coyer, MD   REFERRING PROVIDER: Sharma Coyer, MD   REFERRING DIAG: M05.79 (ICD-10-CM) - Rheumatoid arthritis involving multiple sites with positive rheumatoid factor (HCC)   Rationale for Evaluation and Treatment: Rehabilitation  THERAPY DIAG:  Muscle weakness (generalized)  Chronic pain of both hips  Chronic pain of both knees  ONSET DATE: chronic  SUBJECTIVE:                                                                                                                                                                                            SUBJECTIVE STATEMENT: Pt reports right knee pain higher today 7/10 other areas 3/10. Talked to [facility] center.  Just need to go over and get the membership  POOL ACCESS: currently none, but plans to check out [facility]  PERTINENT HISTORY:  Request for water aerobics to help with RA, joint pains, requesting 2-4 times weekly sessions to help relieve joint pain and reduce weight, current BMI 44.46   PAIN:  Are you having pain? Yes: NPRS scale:  5/10 Pain location: core and Rt ankle Pain description: ache, sore Aggravating factors: stress physical and mental, walking 15 minutes Relieving factors: rest, music; gardening  PRECAUTIONS: None  RED FLAGS: None   WEIGHT BEARING RESTRICTIONS: No  FALLS:  Has patient fallen in last 6 months? No  LIVING ENVIRONMENT: Lives with: lives with their family Lives in: House/apartment Stairs: Yes: Internal: 16 steps; on right going up Has following equipment at home: None  OCCUPATION: not working  PLOF: Independent  PATIENT GOALS: strengthening of le  NEXT MD VISIT: as needed  OBJECTIVE:  Note: Objective measures were completed at Evaluation unless otherwise noted.  DIAGNOSTIC FINDINGS:  None recent in chart  PATIENT SURVEYS:  LEFS  Extreme difficulty/unable (0), Quite a bit of difficulty (1), Moderate difficulty (2), Little difficulty (3), No difficulty (4) Survey date:  12/24/23  Any of your usual work, housework or school activities 2  2. Usual hobbies, recreational or sporting activities 3  3. Getting into/out of the bath 2  4. Walking between rooms 3  5. Putting on socks/shoes 2  6. Squatting  1  7. Lifting an object, like a bag of groceries from the floor 2  8. Performing light activities around your home 2  9. Performing heavy activities around your home 1  10. Getting into/out of a car 2  11. Walking 2 blocks 2  12. Walking 1 mile 1  13. Going up/down 10 stairs  (1 flight) 2  14. Standing for 1 hour 1  15.  sitting for 1 hour  3  16. Running on even ground 1  17. Running on uneven ground 1  18. Making sharp turns while running fast 1  19. Hopping  0  20. Rolling over in bed 2  Score total:  34/56     COGNITION: Overall cognitive status: Within functional limits for tasks assessed     SENSATION: WFL  MUSCLE LENGTH: Hamstrings: Left leg tightness tested in sitting   POSTURE: rounded shoulders and increased lumbar lordosis   LUMBAR ROM:   AROM eval 8/18  Flexion FT to below patella FT to mid calf  Extension neutral   Right lateral flexion wfl   Left lateral flexion wfl   Right rotation    Left rotation     (Blank rows = not tested)  LOWER EXTREMITY ROM:     wfl  LOWER EXTREMITY MMT:    HD Lbs in sitting Right eval Left eval  Hip flexion 35.0 17.8  Hip extension    Hip abduction 10.7 9.8  Hip adduction    Hip internal rotation    Hip external rotation    Knee flexion    Knee extension 11.9 12.7  Ankle dorsiflexion    Ankle plantarflexion    Ankle inversion    Ankle eversion     (Blank rows = not tested)  LUMBAR SPECIAL TESTS:  Slump test: Negative  FUNCTIONAL TESTS:  5 times sit to stand: from pool bench 25s Timed up and go (TUG): 16.29  4 stage balance: Passed 1&2. Tandem stance 3-4s; SLS unable GAIT: Distance walked: 400 ft Assistive device utilized: None Level of assistance: Complete Independence Comments: slight core rotation with increased arm swing (for momentum)  TREATMENT  01/22/24 Pt seen for aquatic therapy today.  Treatment took place in water 3.5-4.75 ft in depth at the [facility] pool. Temp of water was 91.  Pt entered/exited the pool via stairs independently in step-to pattern with bilat rail.  * Unsupported:  multiple laps walking forward / backward * unsupported:  side stepping L/R -> with arm add/abdct added RBHB *standing toe raises/heel  raises x 15 *toe walking then heel  walking forward and back x 2 widths each *Standing unsupported : HS curl x10; hip flex/ext x 10; hip add/abd crossing midline x 10 *tandem stance ue RBHB in 3.8 ft add/abd x 5 leading R/L *SLS as above using RBHB not tolerated due to UE discomfort-> completed without HB ue add/abd x 10. 2 LOB with indep recovery *step ups onto bottom step leading R/L 2 x 5 unsupported * noodle wrapped anteriorly across chest: ue supported on corner wall: hip flex/ext; add/abd; cycling            *walking forward and back between exercises for recovery.      PATIENT EDUCATION:  Education details: reacquainting to aquatic therapy  Person educated: Patient Education method: Explanation Education comprehension: verbalized understanding  HOME EXERCISE PROGRAM: Pt will bring laminated copy from last session.  Unable to find it in [facility] today. Plan to up-date/modify as appropriate.  ASSESSMENT:  CLINICAL IMPRESSION: Pt demonstrates improvement in fluid movement submerged today with general reduction in pain .  She tolerates ue engagement using resistance buoys for added core engagement submerging throughout session with fair toleration. Continued balance training without UE support as well for enhanced challenge. Right knee stiffness increased at initiation of session with reduction using the properties of water and gentle movement patterns throughout session.    From initial evaluation:  Patient is a 53 y.o.  f who was seen today for physical therapy evaluation and treatment for Joint pain due to RA. She is well known to this clinic as she has completed a prior episode x 1 year ago for similar dx.  She presents today with reports of increased LE weakness due to new med Md has put her on. She was unable to gain pool access after episode last year to continue with aquatic HEP. Today she demonstrates le weakness limiting strength , endurance, activity tolerance, gait, balance, and functional mobility with ADL's.  Her pain can be variable and does limit some function but less than le weakness.  She will benefit from skilled aquatic PT to improve all areas of deficits, encourage pt to gain personal pool access with understanding that chronic condition will require consistent management for optimal ability/function.    OBJECTIVE IMPAIRMENTS: Abnormal gait, decreased activity tolerance, decreased balance, decreased endurance, decreased knowledge of condition, decreased knowledge of use of DME, decreased mobility, difficulty walking, decreased ROM, decreased strength, hypomobility, increased edema, impaired perceived functional ability, increased muscle spasms, impaired flexibility, impaired UE functional use, improper body mechanics, postural dysfunction, obesity, and pain.    ACTIVITY LIMITATIONS: carrying, lifting, bending, standing, squatting, stairs, transfers, bed mobility, dressing, reach over head, hygiene/grooming, and locomotion level   PARTICIPATION LIMITATIONS: interpersonal relationship, community activity, occupation, and   difficulty moving around, doing her daily tasks such as working, shopping, going up and down stairs, using her hands, dancing, spending time with grandchildren, getting up and down from the floor, keeping her balance   PERSONAL FACTORS: Fitness, Past/current experiences, Profession, Time since onset of injury/illness/exacerbation, Transportation, and 3+ comorbidities:   anemia, CAD, esophagitis, menorrhagia, NSTEMI (and cardiac cath 2014), active severe Stage 3 rheumatoid arthritis with positive rheumatoid factor are also affecting patient's functional outcome.    REHAB POTENTIAL: Good   CLINICAL DECISION MAKING: Evolving/moderate complexity   EVALUATION COMPLEXITY: Moderate   GOALS: Goals reviewed with patient? Yes  SHORT TERM GOALS: Target date: 01/11/24  Pt will tolerate full aquatic sessions consistently without increase in pain and with improving function to  demonstrate good toleration and effectiveness of intervention.  Baseline:TBA Goal status:Met 01/13/24  2.  Pt to gain pool access for indep completion of aquatic HEP Baseline: list of pool hand out issued Goal status: In progress 01/19/24  3.  Pt will improve lumbar flex to FT to mid calf for reduced tightness in post core. Baseline: see chart Goal status: Met 01/19/24   LONG TERM GOALS: Target date: 02/20/24  Pt to improve on LEFS by at least 9 point to demonstrate statistically significant Improvement in function. Baseline: 34/56 Goal status: INITIAL  2.  Pt will report decrease in pain by at least 50% for improved toleration to activity/quality of life and to demonstrate improved management of pain. Baseline:  Goal status: INITIAL  3.  Pt will improve strength in all areas listed by at least 10 lbs to demonstrate improved overall physical function Baseline:  Goal status: INITIAL  4.  Pt will be indep with final aquatic HEP for continued management of condition Baseline:  Goal status: INITIAL  5.  Pt will perform SLS unsupported x 10s to demonstrate and improvement in balance as well as LE strength Baseline:  Goal status: INITIAL  6.  Pt will improve on Tug test to <or= 13.5s (13.5=community dwelling adults) to demonstrate improvement in lower extremity function, mobility and decreased fall risk. Baseline: 16.29 Goal status: INITIAL  PLAN:  PT FREQUENCY: 1-2x/week  PT  DURATION: 8 weeks  PLANNED INTERVENTIONS: 97164- PT Re-evaluation, 97750- Physical Performance Testing, 97110-Therapeutic exercises, 97530- Therapeutic activity, [id number]- Neuromuscular re-education, 97535- Self Care, 02859- Manual therapy, [phone]- Gait training, [phone]- Aquatic Therapy, [phone]- Electrical stimulation (manual), [id number]- Ionotophoresis 4mg /ml Dexamethasone , 79439 (1-2 muscles), 20561 (3+ muscles)- Dry Needling, Patient/Family education, Balance training, Stair training, Taping, Joint mobilization, DME  instructions, Cryotherapy, and Moist heat.  PLAN FOR NEXT SESSION: aquatics for [employer] and core strengthening (focus on quad str); balance and proprioceptive retraining; activity toleration  Ronal Foots) Verdon Ferrante MPT 01/22/24 8:54 AM [facility] [address] [place], [place], [address] Phone: [phone]   Fax:  [phone]     For all possible CPT codes, reference the Planned Interventions line above.     Check all conditions that are expected to impact treatment: {Conditions expected to impact treatment:Morbid obesity and Musculoskeletal disorders   If treatment provided at initial evaluation, no treatment charged due to lack of authorization.

## 2024-01-27 ENCOUNTER — Ambulatory Visit (HOSPITAL_BASED_OUTPATIENT_CLINIC_OR_DEPARTMENT_OTHER): Admitting: Physical Therapy

## 2024-02-03 ENCOUNTER — Ambulatory Visit (HOSPITAL_BASED_OUTPATIENT_CLINIC_OR_DEPARTMENT_OTHER): Admitting: Physical Therapy

## 2024-02-03 ENCOUNTER — Ambulatory Visit: Admitting: Family Medicine

## 2024-02-04 ENCOUNTER — Encounter: Payer: Self-pay | Admitting: Family Medicine

## 2024-02-04 ENCOUNTER — Ambulatory Visit: Admitting: Family Medicine

## 2024-02-04 VITALS — BP 104/75 | HR 99 | Resp 16 | Ht 63.0 in | Wt 238.9 lb

## 2024-02-04 DIAGNOSIS — Z7689 Persons encountering health services in other specified circumstances: Secondary | ICD-10-CM | POA: Diagnosis not present

## 2024-02-04 DIAGNOSIS — R0781 Pleurodynia: Secondary | ICD-10-CM | POA: Diagnosis not present

## 2024-02-04 DIAGNOSIS — M0579 Rheumatoid arthritis with rheumatoid factor of multiple sites without organ or systems involvement: Secondary | ICD-10-CM | POA: Diagnosis not present

## 2024-02-04 DIAGNOSIS — Z6841 Body Mass Index (BMI) 40.0 and over, adult: Secondary | ICD-10-CM | POA: Diagnosis not present

## 2024-02-04 DIAGNOSIS — E66813 Obesity, class 3: Secondary | ICD-10-CM | POA: Diagnosis not present

## 2024-02-04 MED ORDER — SEMAGLUTIDE-WEIGHT MANAGEMENT 1.7 MG/0.75ML ~~LOC~~ SOAJ
1.7000 mg | SUBCUTANEOUS | 0 refills | Status: DC
  Filled 2024-02-25: qty 3, 28d supply, fill #0

## 2024-02-04 NOTE — Progress Notes (Signed)
 Established patient visit   Patient: Abigail Garcia   DOB: [birth year]   53 y.o. Female  MRN: [id number] Visit Date: 02/04/2024  Today's healthcare provider: Rockie Agent, MD   Chief Complaint  Patient presents with   Follow-up    Weight management   Subjective     HPI     Follow-up    Additional comments: Weight management      Last edited by Rosas, Joseline E, CMA on 02/04/2024  8:02 AM.       Discussed the use of AI scribe software for clinical note transcription with the patient, who gave verbal consent to proceed.  History of Present Illness Abigail Garcia is a 53 year old female who presents for follow-up on chronic obesity management.  She has experienced a weight loss from 247 pounds to 238 pounds, totaling a loss of 9 pounds. Her BMI is currently 42.32. She has been on semaglutide  1 mg once weekly for a few weeks and is on her third pen. She is focusing on increasing her protein intake, consuming chicken, salmon, and Boost protein shakes, although she notes she is not reaching her protein goals yet. She attends physical therapy once a week and engages in Aquasart exercises.  She has been experiencing continuous menstrual bleeding since April, with only brief periods of cessation. She is currently taking Megace , two pills a day, which slows the bleeding but does not stop it completely. She has a history of fibroids, but recent ultrasounds have not shown any changes that would explain the excessive bleeding. She suspects it might be related to perimenopause. She feels tired due to continuous bleeding and notes that NSAIDs do not help with the bleeding.  She reports ongoing lung pain since having pneumonia earlier in the year, with pain localized to the right side, especially noticeable when yawning. She last had imaging done in February or March. She continues to cough up mucus, particularly in the mornings, and feels her lungs have not completely healed. She is  concerned about the upcoming winter season and the potential for getting sick again.  She is no longer taking Plaquenil and methotrexate for RA, and has not been in recent contact with her rheumatologist. She is currently taking Mobic  as needed. She also takes vitamin D  weekly and inquires about the need for vitamin K supplementation.     Past Medical History:  Diagnosis Date   Anemia    Blood transfusion without reported diagnosis    CAD (coronary artery disease)    a. 11/2012 NSTEMI/Cath/PCI: LM nl, LAD 80-90 thrombotic (tx with Heparin  x 2 days then aspiration thrombectomy and PTCA), RI nl, LCX sm/nl, RCA nl, EF 55-65%.   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 Echo (6/14) with EF 55-60%.    Hypertension    Menorrhagia    NSTEMI (non-ST elevated myocardial infarction) (HCC)    11/2012    Obesity    RA (rheumatoid arthritis) (HCC)     Medications: Outpatient Medications Prior to Visit  Medication Sig Note   aspirin  81 MG EC tablet Take 1 tablet (81 mg total) by mouth daily.    clindamycin (CLEOCIN T) 1 % lotion SMARTSIG:Topical 1-2 Times Daily PRN    Evolocumab  (REPATHA  SURECLICK) 140 MG/ML SOAJ Inject 140 mg into the skin every 14 (fourteen)  days.    ezetimibe  (ZETIA ) 10 MG tablet Take 1 tablet (10 mg total) by mouth daily.    meloxicam  (MOBIC ) 15 MG tablet Take 1 tablet (15 mg total) by mouth daily.    metoprolol  tartrate (LOPRESSOR ) 25 MG tablet Take 0.5 tablets (12.5 mg total) by mouth 2 (two) times daily.    norethindrone  (AYGESTIN ) 5 MG tablet Take 2 tablets (10 mg total) by mouth daily.    triamcinolone cream (KENALOG) 0.1 % SMARTSIG:1 Application Topical 2-3 Times Daily    Vitamin D , Ergocalciferol , (DRISDOL) 1.25 MG (50000 UNIT) CAPS capsule Take 50,000 Units by mouth once a week.    [DISCONTINUED] semaglutide -weight management (WEGOVY ) 1 MG/0.5ML SOAJ SQ  injection Inject 1 mg into the skin once a week.    [DISCONTINUED] hydroxychloroquine (PLAQUENIL) 200 MG tablet Take 200 mg by mouth 2 (two) times daily. 02/04/2024: 30   [DISCONTINUED] methotrexate (RHEUMATREX) 2.5 MG tablet Take 15 mg by mouth once a week. Take 6 tablets(15 mg total) by mouth every 7 days for 90 days 02/04/2024: 30   [DISCONTINUED] Spacer/Aero-Hold Chamber Bags MISC 1 each by Does not apply route daily.    [DISCONTINUED] Spacer/Aero-Hold Chamber Bags MISC 1 each by Does not apply route daily.    No facility-administered medications prior to visit.    Review of Systems  Last metabolic panel Lab Results  Component Value Date   GLUCOSE 88 09/09/2023   NA 139 09/09/2023   K 4.0 09/09/2023   CL 102 09/09/2023   CO2 22 09/09/2023   BUN 11 09/09/2023   CREATININE 0.74 09/09/2023   EGFR 97 09/09/2023   CALCIUM  9.8 09/09/2023   PROT 7.6 09/09/2023   ALBUMIN 4.0 09/09/2023   LABGLOB 3.6 09/09/2023   AGRATIO 1.1 (L) 10/22/2022   BILITOT 0.3 09/09/2023   ALKPHOS 85 09/09/2023   AST 11 09/09/2023   ALT 9 09/09/2023   ANIONGAP 10 03/14/2019   Last vitamin D  Lab Results  Component Value Date   VD25OH 23.3 (L) 10/22/2022        Objective    BP 104/75 (BP Location: Right Arm, Patient Position: Sitting, Cuff Size: Large)   Pulse 99   Resp 16   Ht 5' 3 (1.6 m)   Wt 238 lb 14.4 oz (108.4 kg)   SpO2 100%   BMI 42.32 kg/m   BP Readings from Last 3 Encounters:  02/04/24 104/75  01/01/24 115/80  12/18/23 115/82   Wt Readings from Last 3 Encounters:  02/04/24 238 lb 14.4 oz (108.4 kg)  01/01/24 244 lb 6.4 oz (110.9 kg)  12/18/23 247 lb (112 kg)        Physical Exam  Physical Exam MEASUREMENTS: Weight- 238 lbs, BMI- 42.32. CHEST: Clear to auscultation bilaterally. No wheezes, rhonchi, or crackles. Air movement equal bilaterally.    No results found for any visits on 02/04/24.  Assessment & Plan     Problem List Items Addressed This Visit     Obesity  - Primary   Relevant Medications   semaglutide -weight management (WEGOVY ) 1 MG/0.5ML SOAJ SQ injection   Rheumatoid arthritis involving multiple sites with positive rheumatoid factor (HCC)   Other Visit Diagnoses       Rib pain on left side       Relevant Orders   DG Chest 2 View       Assessment & Plan Obesity, class 3 Chronic Obesity, class 3 with a BMI of 42.32. Weight has decreased from 247 pounds to 238 pounds, a  total loss of 9 pounds.Patient reports greater loss with previous weight to max of 263 within last 4 years. Currently on semaglutide  1 mg weekly. Engaged in physical therapy once a week and personal exercise routines during aquatic therapy. Increasing protein intake with sources like chicken, salmon, and protein shakes. Goal weight is set at 200 pounds. - Continue semaglutide  1 mg weekly and provide refills for three months - Encourage continued physical activity and protein intake - Set goal of 200 minutes of exercise per week - Monitor weight and abdominal circumference weekly - Follow up in three months to assess progress and consider increasing semaglutide  dose  Abnormal uterine bleeding with uterine fibroids Chronic abnormal uterine bleeding since April, with uterine fibroids. Bleeding is persistent with only occasional slowing. Previous ultrasound showed fibroids with no significant changes since 2020. Possible perimenopausal symptoms. Current treatment with Megace , which slows but does not stop the bleeding. - Continue current treatment with Megace  - Monitor bleeding pattern and correlation with semaglutide  - recommended continued follow up with GYN as scheduled   Chronic left-sided pleuritic chest pain post-pneumonia Chronic left-sided pleuritic chest pain since pneumonia earlier this year. Previous imaging in February showed opacity in the left lung base, suggesting ongoing infection versus scarring. Persistent symptoms include soreness with yawning and mucus  production, especially in the mornings. No wheezing or crackles on exam, but reduced exercise tolerance noted. - Order repeat chest x-ray to assess for changes in lung opacity - Refer to pulmonologist if x-ray shows scarring or other abnormalities     Return in about 3 months (around 05/05/2024) for Weight MGMT.         Rockie Agent, MD  [facility] [phone] (phone) [phone] (fax)  [facility]

## 2024-02-04 NOTE — Patient Instructions (Signed)
 Please report to [facility] located at:  [address]  [place], [place] [address]  You do not need an appointment to have xrays completed.   Our office will follow up with  results once available.

## 2024-02-06 ENCOUNTER — Ambulatory Visit (HOSPITAL_BASED_OUTPATIENT_CLINIC_OR_DEPARTMENT_OTHER): Attending: Family Medicine | Admitting: Physical Therapy

## 2024-02-06 ENCOUNTER — Encounter (HOSPITAL_BASED_OUTPATIENT_CLINIC_OR_DEPARTMENT_OTHER): Payer: Self-pay | Admitting: Physical Therapy

## 2024-02-06 DIAGNOSIS — R262 Difficulty in walking, not elsewhere classified: Secondary | ICD-10-CM | POA: Insufficient documentation

## 2024-02-06 DIAGNOSIS — M25562 Pain in left knee: Secondary | ICD-10-CM | POA: Insufficient documentation

## 2024-02-06 DIAGNOSIS — M25551 Pain in right hip: Secondary | ICD-10-CM | POA: Insufficient documentation

## 2024-02-06 DIAGNOSIS — M25552 Pain in left hip: Secondary | ICD-10-CM | POA: Insufficient documentation

## 2024-02-06 DIAGNOSIS — M6281 Muscle weakness (generalized): Secondary | ICD-10-CM | POA: Diagnosis not present

## 2024-02-06 DIAGNOSIS — G8929 Other chronic pain: Secondary | ICD-10-CM | POA: Diagnosis not present

## 2024-02-06 DIAGNOSIS — M25561 Pain in right knee: Secondary | ICD-10-CM | POA: Diagnosis not present

## 2024-02-06 NOTE — Therapy (Signed)
 OUTPATIENT PHYSICAL THERAPY THORACOLUMBAR TREATMENT   Patient Name: Abigail Garcia MRN: [id number] DOB:05/04/1971, 53 y.o., female Today's Date: 02/06/2024  END OF SESSION:  PT End of Session - 02/06/24 0935     Visit Number 7    Date for PT Re-Evaluation 02/20/24    Authorization Type [employer] mcaid    Authorization Time Period 12/24/23-02/22/24    Authorization - Visit Number 7    Authorization - Number of Visits 10    Progress Note Due on Visit 10    PT Start Time 0934    PT Stop Time 1015    PT Time Calculation (min) 41 min    Activity Tolerance Patient tolerated treatment well    Behavior During Therapy [facility] for tasks assessed/performed           Past Medical History:  Diagnosis Date   Anemia    Blood transfusion without reported diagnosis    CAD (coronary artery disease)    a. 11/2012 NSTEMI/Cath/PCI: LM nl, LAD 80-90 thrombotic (tx with Heparin  x 2 days then aspiration thrombectomy and PTCA), RI nl, LCX sm/nl, RCA nl, EF 55-65%.   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 Echo (6/14) with EF 55-60%.    Hypertension    Menorrhagia    NSTEMI (non-ST elevated myocardial infarction) (HCC)    11/2012    Obesity    RA (rheumatoid arthritis) (HCC)    Past Surgical History:  Procedure Laterality Date   bilateral tubal     BREAST BIOPSY Left    benign   CARDIAC CATHETERIZATION  06/03/2012   CESAREAN SECTION     CORONARY ANGIOGRAM  11/19/2012   Procedure: CORONARY ANGIOGRAM;  Surgeon: Peter M Swaziland, MD;  Location: [facility] CATH LAB;  Service: Cardiovascular;;   DILATION AND CURETTAGE OF UTERUS     DILATION AND CURETTAGE OF UTERUS N/A 03/13/2019   Procedure: DILATATION AND CURETTAGE;  Surgeon: Starla Harland BROCKS, MD;  Location: [facility] OR;  Service: Gynecology;  Laterality: N/A;   INTRAVASCULAR ULTRASOUND  11/19/2012   Procedure: INTRAVASCULAR ULTRASOUND;  Surgeon: Peter M Swaziland,  MD;  Location: [facility] CATH LAB;  Service: Cardiovascular;;   LEFT HEART CATHETERIZATION WITH CORONARY ANGIOGRAM N/A 11/16/2012   Procedure: LEFT HEART CATHETERIZATION WITH CORONARY ANGIOGRAM;  Surgeon: Peter M Swaziland, MD;  Location: [facility] CATH LAB;  Service: Cardiovascular;  Laterality: N/A;   PERCUTANEOUS CORONARY INTERVENTION-BALLOON ONLY  11/19/2012   Procedure: PERCUTANEOUS CORONARY INTERVENTION-BALLOON ONLY;  Surgeon: Peter M Swaziland, MD;  Location: [facility] CATH LAB;  Service: Cardiovascular;;   Patient Active Problem List   Diagnosis Date Noted   Dysplasia of cervix, low grade (CIN 1) 07/10/2023   ASCUS with positive high risk HPV on pap 06/10/2023 06/13/2023   Pressure in head 04/08/2023   COVID-19 vaccine series declined 10/22/2022   Dysfunctional uterine bleeding 10/22/2022   Epidermal inclusion cyst 10/22/2022   Healthcare maintenance 10/22/2022   Epidermal cyst 04/24/2021   Symptomatic anemia 03/12/2019   Hirsutism 04/17/2017   Prediabetes 08/18/2015   Blurry vision, bilateral 08/18/2015   Abnormal uterine bleeding (AUB) 08/11/2015   Obesity 11/11/2013   Thrombocytosis 08/31/2013   Ganglion cyst of wrist 06/12/2013   Hyperlipidemia 12/03/2012   CAD S/P percutaneous coronary angioplasty 11/30/2012   Rheumatoid arthritis involving multiple sites with positive rheumatoid factor (HCC) 11/14/2012   Anemia, iron  deficiency  11/14/2012    PCP: Sharma Coyer, MD   REFERRING PROVIDER: Sharma Coyer, MD   REFERRING DIAG: M05.79 (ICD-10-CM) - Rheumatoid arthritis involving multiple sites with positive rheumatoid factor (HCC)   Rationale for Evaluation and Treatment: Rehabilitation  THERAPY DIAG:  Muscle weakness (generalized)  Chronic pain of both hips  Chronic pain of both knees  ONSET DATE: chronic  SUBJECTIVE:                                                                                                                                                                                            SUBJECTIVE STATEMENT: Pt reports lowered pain sensitivity today 1-2/10  POOL ACCESS: currently none, but plans to [facility] out [facility]  PERTINENT HISTORY:  Request for water aerobics to help with RA, joint pains, requesting 2-4 times weekly sessions to help relieve joint pain and reduce weight, current BMI 44.46   PAIN:  Are you having pain? Yes: NPRS scale:  5/10 Pain location: core and Rt ankle Pain description: ache, sore Aggravating factors: stress physical and mental, walking 15 minutes Relieving factors: rest, music; gardening  PRECAUTIONS: None  RED FLAGS: None   WEIGHT BEARING RESTRICTIONS: No  FALLS:  Has patient fallen in last 6 months? No  LIVING ENVIRONMENT: Lives with: lives with their family Lives in: House/apartment Stairs: Yes: Internal: 16 steps; on right going up Has following equipment at home: None  OCCUPATION: not working  PLOF: Independent  PATIENT GOALS: strengthening of le  NEXT MD VISIT: as needed  OBJECTIVE:  Note: Objective measures were completed at Evaluation unless otherwise noted.  DIAGNOSTIC FINDINGS:  None recent in chart  PATIENT SURVEYS:  LEFS  Extreme difficulty/unable (0), Quite a bit of difficulty (1), Moderate difficulty (2), Little difficulty (3), No difficulty (4) Survey date:  12/24/23  Any of your usual work, housework or school activities 2  2. Usual hobbies, recreational or sporting activities 3  3. Getting into/out of the bath 2  4. Walking between rooms 3  5. Putting on socks/shoes 2  6. Squatting  1  7. Lifting an object, like a bag of groceries from the floor 2  8. Performing light activities around your home 2  9. Performing heavy activities around your home 1  10. Getting into/out of a car 2  11. Walking 2 blocks 2  12. Walking 1 mile 1  13. Going up/down 10 stairs (1 flight) 2  14. Standing for 1 hour 1  15.  sitting for 1 hour 3  16. Running on even  ground 1  17. Running on uneven ground 1  18. Making  sharp turns while running fast 1  19. Hopping  0  20. Rolling over in bed 2  Score total:  34/56     COGNITION: Overall cognitive status: Within functional limits for tasks assessed     SENSATION: WFL  MUSCLE LENGTH: Hamstrings: Left leg tightness tested in sitting   POSTURE: rounded shoulders and increased lumbar lordosis   LUMBAR ROM:   AROM eval 8/18  Flexion FT to below patella FT to mid calf  Extension neutral   Right lateral flexion wfl   Left lateral flexion wfl   Right rotation    Left rotation     (Blank rows = not tested)  LOWER EXTREMITY ROM:     wfl  LOWER EXTREMITY MMT:    HD Lbs in sitting Right eval Left eval  Hip flexion 35.0 17.8  Hip extension    Hip abduction 10.7 9.8  Hip adduction    Hip internal rotation    Hip external rotation    Knee flexion    Knee extension 11.9 12.7  Ankle dorsiflexion    Ankle plantarflexion    Ankle inversion    Ankle eversion     (Blank rows = not tested)  LUMBAR SPECIAL TESTS:  Slump test: Negative  FUNCTIONAL TESTS:  5 times sit to stand: from pool bench 25s Timed up and go (TUG): 16.29  4 stage balance: Passed 1&2. Tandem stance 3-4s; SLS unable GAIT: Distance walked: 400 ft Assistive device utilized: None Level of assistance: Complete Independence Comments: slight core rotation with increased arm swing (for momentum)  TREATMENT    02/06/24 Pt seen for aquatic therapy today.  Treatment took place in water 3.5-4.75 ft in depth at the [facility] pool. Temp of water was 91.  Pt entered/exited the pool via stairs independently in step-to pattern with bilat rail.  * Unsupported:  multiple laps walking forward / backward * unsupported:  side stepping L/R -> with arm add/abdct added RBHB *standing toe raises/heel  raises x 15 *toe walking then heel walking forward and back x 2 widths each *tandem stance ue RBHB in 3.6 ft add/abd x 10  leading R/L *forward and back tandem walking ue support RBHB *prone suspension for ant core stretching *suspended vertical noodle wrapped anteriorly knees to chest 2 x 10; hip flex/ext; add/abd; cycling  *SLS as above using RBHB not tolerated due to UE discomfort-> completed without HB ue add/abd x 10. 2 LOB with indep recovery *Standing unsupported : HS curl x10; hip flex/ext x 10; hip add/abd crossing midline x 10        PATIENT EDUCATION:  Education details: reacquainting to aquatic therapy  Person educated: Patient Education method: Explanation Education comprehension: verbalized understanding  HOME EXERCISE PROGRAM: Pt will bring laminated copy from last session.  Unable to find it in [facility] today. Plan to up-date/modify as appropriate.  ASSESSMENT:  CLINICAL IMPRESSION: Pt sensitivity low today. Was able to tolerate advancement of core strengthening and balance and gain suspended prone for increased anterior core stretching.  She tolerates very well.  VC provided as needed. Assistance with noodle placement for positioning toleration in suspended prone.  Good progress towards remaining goals. Plan to modify/add to original HEP which pt is to bring in next session preparing for anticipated dc.      From initial evaluation:  Patient is a 53 y.o. f who was seen today for physical therapy evaluation and treatment for Joint pain due to RA. She is well known to this clinic  as she has completed a prior episode x 1 year ago for similar dx.  She presents today with reports of increased LE weakness due to new med Md has put her on. She was unable to gain pool access after episode last year to continue with aquatic HEP. Today she demonstrates le weakness limiting strength , endurance, activity tolerance, gait, balance, and functional mobility with ADL's. Her pain can be variable and does limit some function but less than le weakness.  She will benefit from skilled aquatic PT to improve  all areas of deficits, encourage pt to gain personal pool access with understanding that chronic condition will require consistent management for optimal ability/function.    OBJECTIVE IMPAIRMENTS: Abnormal gait, decreased activity tolerance, decreased balance, decreased endurance, decreased knowledge of condition, decreased knowledge of use of DME, decreased mobility, difficulty walking, decreased ROM, decreased strength, hypomobility, increased edema, impaired perceived functional ability, increased muscle spasms, impaired flexibility, impaired UE functional use, improper body mechanics, postural dysfunction, obesity, and pain.    ACTIVITY LIMITATIONS: carrying, lifting, bending, standing, squatting, stairs, transfers, bed mobility, dressing, reach over head, hygiene/grooming, and locomotion level   PARTICIPATION LIMITATIONS: interpersonal relationship, community activity, occupation, and   difficulty moving around, doing her daily tasks such as working, shopping, going up and down stairs, using her hands, dancing, spending time with grandchildren, getting up and down from the floor, keeping her balance   PERSONAL FACTORS: Fitness, Past/current experiences, Profession, Time since onset of injury/illness/exacerbation, Transportation, and 3+ comorbidities:   anemia, CAD, esophagitis, menorrhagia, NSTEMI (and cardiac cath 2014), active severe Stage 3 rheumatoid arthritis with positive rheumatoid factor are also affecting patient's functional outcome.    REHAB POTENTIAL: Good   CLINICAL DECISION MAKING: Evolving/moderate complexity   EVALUATION COMPLEXITY: Moderate   GOALS: Goals reviewed with patient? Yes  SHORT TERM GOALS: Target date: 01/11/24  Pt will tolerate full aquatic sessions consistently without increase in pain and with improving function to demonstrate good toleration and effectiveness of intervention.  Baseline:TBA Goal status:Met 01/13/24  2.  Pt to gain pool access for indep  completion of aquatic HEP Baseline: list of pool hand out issued Goal status: In progress 01/19/24  3.  Pt will improve lumbar flex to FT to mid calf for reduced tightness in post core. Baseline: see chart Goal status: Met 01/19/24   LONG TERM GOALS: Target date: 02/20/24  Pt to improve on LEFS by at least 9 point to demonstrate statistically significant Improvement in function. Baseline: 34/56 Goal status: INITIAL  2.  Pt will report decrease in pain by at least 50% for improved toleration to activity/quality of life and to demonstrate improved management of pain. Baseline:  Goal status: INITIAL  3.  Pt will improve strength in all areas listed by at least 10 lbs to demonstrate improved overall physical function Baseline:  Goal status: INITIAL  4.  Pt will be indep with final aquatic HEP for continued management of condition Baseline:  Goal status: In progress 02/06/24  5.  Pt will perform SLS unsupported x 10s to demonstrate and improvement in balance as well as LE strength Baseline:  Goal status: INITIAL  6.  Pt will improve on Tug test to <or= 13.5s (13.5=community dwelling adults) to demonstrate improvement in lower extremity function, mobility and decreased fall risk. Baseline: 16.29 Goal status: INITIAL  PLAN:  PT FREQUENCY: 1-2x/week  PT DURATION: 8 weeks  PLANNED INTERVENTIONS: 97164- PT Re-evaluation, 97750- Physical Performance Testing, 97110-Therapeutic exercises, 97530- Therapeutic activity, [id number]- Neuromuscular re-education, 97535-  Self Care, 02859- Manual therapy, [id number]- Gait training, [phone]- Aquatic Therapy, [phone]- Electrical stimulation (manual), [phone]- Ionotophoresis 4mg /ml Dexamethasone , 20560 (1-2 muscles), 20561 (3+ muscles)- Dry Needling, Patient/Family education, Balance training, Stair training, Taping, Joint mobilization, DME instructions, Cryotherapy, and Moist heat.  PLAN FOR NEXT SESSION: aquatics for [employer] and core strengthening (focus on quad str);  balance and proprioceptive retraining; activity toleration  Ronal Foots) Gennifer Potenza MPT 02/06/24 9:42 AM [facility] [address] [place], [place], [address] Phone: [phone]   Fax:  [phone]     For all possible CPT codes, reference the Planned Interventions line above.     Check all conditions that are expected to impact treatment: {Conditions expected to impact treatment:Morbid obesity and Musculoskeletal disorders   If treatment provided at initial evaluation, no treatment charged due to lack of authorization.

## 2024-02-10 ENCOUNTER — Ambulatory Visit (HOSPITAL_BASED_OUTPATIENT_CLINIC_OR_DEPARTMENT_OTHER): Admitting: Physical Therapy

## 2024-02-10 ENCOUNTER — Encounter (HOSPITAL_BASED_OUTPATIENT_CLINIC_OR_DEPARTMENT_OTHER): Payer: Self-pay | Admitting: Physical Therapy

## 2024-02-10 DIAGNOSIS — M25561 Pain in right knee: Secondary | ICD-10-CM | POA: Diagnosis not present

## 2024-02-10 DIAGNOSIS — M25562 Pain in left knee: Secondary | ICD-10-CM | POA: Diagnosis not present

## 2024-02-10 DIAGNOSIS — M6281 Muscle weakness (generalized): Secondary | ICD-10-CM

## 2024-02-10 DIAGNOSIS — G8929 Other chronic pain: Secondary | ICD-10-CM | POA: Diagnosis not present

## 2024-02-10 DIAGNOSIS — R262 Difficulty in walking, not elsewhere classified: Secondary | ICD-10-CM

## 2024-02-10 DIAGNOSIS — M25552 Pain in left hip: Secondary | ICD-10-CM | POA: Diagnosis not present

## 2024-02-10 DIAGNOSIS — M25551 Pain in right hip: Secondary | ICD-10-CM | POA: Diagnosis not present

## 2024-02-10 NOTE — Therapy (Signed)
 OUTPATIENT PHYSICAL THERAPY THORACOLUMBAR TREATMENT   Patient Name: Abigail Garcia MRN: [id number] DOB:04/16/1971, 53 y.o., female Today's Date: 02/10/2024  END OF SESSION:  PT End of Session - 02/10/24 0933     Visit Number 8    Date for PT Re-Evaluation 02/20/24    Authorization Type [employer] mcaid    Authorization Time Period 12/24/23-02/22/24    Authorization - Visit Number 8    Authorization - Number of Visits 10    Progress Note Due on Visit 10    PT Start Time 0932    PT Stop Time 1014    PT Time Calculation (min) 42 min    Activity Tolerance Patient tolerated treatment well    Behavior During Therapy [facility] for tasks assessed/performed           Past Medical History:  Diagnosis Date   Anemia    Blood transfusion without reported diagnosis    CAD (coronary artery disease)    a. 11/2012 NSTEMI/Cath/PCI: LM nl, LAD 80-90 thrombotic (tx with Heparin  x 2 days then aspiration thrombectomy and PTCA), RI nl, LCX sm/nl, RCA nl, EF 55-65%.   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 Echo (6/14) with EF 55-60%.    Hypertension    Menorrhagia    NSTEMI (non-ST elevated myocardial infarction) (HCC)    11/2012    Obesity    RA (rheumatoid arthritis) (HCC)    Past Surgical History:  Procedure Laterality Date   bilateral tubal     BREAST BIOPSY Left    benign   CARDIAC CATHETERIZATION  06/03/2012   CESAREAN SECTION     CORONARY ANGIOGRAM  11/19/2012   Procedure: CORONARY ANGIOGRAM;  Surgeon: Peter M Swaziland, MD;  Location: [facility] CATH LAB;  Service: Cardiovascular;;   DILATION AND CURETTAGE OF UTERUS     DILATION AND CURETTAGE OF UTERUS N/A 03/13/2019   Procedure: DILATATION AND CURETTAGE;  Surgeon: Starla Harland BROCKS, MD;  Location: [facility] OR;  Service: Gynecology;  Laterality: N/A;   INTRAVASCULAR ULTRASOUND  11/19/2012   Procedure: INTRAVASCULAR ULTRASOUND;  Surgeon: Peter M Swaziland,  MD;  Location: [facility] CATH LAB;  Service: Cardiovascular;;   LEFT HEART CATHETERIZATION WITH CORONARY ANGIOGRAM N/A 11/16/2012   Procedure: LEFT HEART CATHETERIZATION WITH CORONARY ANGIOGRAM;  Surgeon: Peter M Swaziland, MD;  Location: [facility] CATH LAB;  Service: Cardiovascular;  Laterality: N/A;   PERCUTANEOUS CORONARY INTERVENTION-BALLOON ONLY  11/19/2012   Procedure: PERCUTANEOUS CORONARY INTERVENTION-BALLOON ONLY;  Surgeon: Peter M Swaziland, MD;  Location: [facility] CATH LAB;  Service: Cardiovascular;;   Patient Active Problem List   Diagnosis Date Noted   Dysplasia of cervix, low grade (CIN 1) 07/10/2023   ASCUS with positive high risk HPV on pap 06/10/2023 06/13/2023   Pressure in head 04/08/2023   COVID-19 vaccine series declined 10/22/2022   Dysfunctional uterine bleeding 10/22/2022   Epidermal inclusion cyst 10/22/2022   Healthcare maintenance 10/22/2022   Epidermal cyst 04/24/2021   Symptomatic anemia 03/12/2019   Hirsutism 04/17/2017   Prediabetes 08/18/2015   Blurry vision, bilateral 08/18/2015   Abnormal uterine bleeding (AUB) 08/11/2015   Obesity 11/11/2013   Thrombocytosis 08/31/2013   Ganglion cyst of wrist 06/12/2013   Hyperlipidemia 12/03/2012   CAD S/P percutaneous coronary angioplasty 11/30/2012   Rheumatoid arthritis involving multiple sites with positive rheumatoid factor (HCC) 11/14/2012   Anemia, iron  deficiency  11/14/2012    PCP: Sharma Coyer, MD   REFERRING PROVIDER: Sharma Coyer, MD   REFERRING DIAG: M05.79 (ICD-10-CM) - Rheumatoid arthritis involving multiple sites with positive rheumatoid factor (HCC)   Rationale for Evaluation and Treatment: Rehabilitation  THERAPY DIAG:  Muscle weakness (generalized)  Chronic pain of both hips  Chronic pain of both knees  Difficulty in walking, not elsewhere classified  ONSET DATE: chronic  SUBJECTIVE:                                                                                                                                                                                            SUBJECTIVE STATEMENT:  Pt reports she had dairy yesterday which has flared her hand and ankle pain. 5/10  POOL ACCESS: currently none, but plans to check out [facility]  PERTINENT HISTORY:  Request for water aerobics to help with RA, joint pains, requesting 2-4 times weekly sessions to help relieve joint pain and reduce weight, current BMI 44.46   PAIN:  Are you having pain? Yes: NPRS scale:  5/10 Pain location: core and Rt ankle Pain description: ache, sore Aggravating factors: stress physical and mental, walking 15 minutes Relieving factors: rest, music; gardening  PRECAUTIONS: None  RED FLAGS: None   WEIGHT BEARING RESTRICTIONS: No  FALLS:  Has patient fallen in last 6 months? No  LIVING ENVIRONMENT: Lives with: lives with their family Lives in: House/apartment Stairs: Yes: Internal: 16 steps; on right going up Has following equipment at home: None  OCCUPATION: not working  PLOF: Independent  PATIENT GOALS: strengthening of le  NEXT MD VISIT: as needed  OBJECTIVE:  Note: Objective measures were completed at Evaluation unless otherwise noted.  DIAGNOSTIC FINDINGS:  None recent in chart  PATIENT SURVEYS:  LEFS  Extreme difficulty/unable (0), Quite a bit of difficulty (1), Moderate difficulty (2), Little difficulty (3), No difficulty (4) Survey date:  12/24/23  Any of your usual work, housework or school activities 2  2. Usual hobbies, recreational or sporting activities 3  3. Getting into/out of the bath 2  4. Walking between rooms 3  5. Putting on socks/shoes 2  6. Squatting  1  7. Lifting an object, like a bag of groceries from the floor 2  8. Performing light activities around your home 2  9. Performing heavy activities around your home 1  10. Getting into/out of a car 2  11. Walking 2 blocks 2  12. Walking 1 mile 1  13. Going up/down 10 stairs (1  flight) 2  14. Standing for 1 hour 1  15.  sitting for 1 hour 3  16. Running on even ground 1  17. Running on uneven ground 1  18. Making sharp turns while running fast 1  19. Hopping  0  20. Rolling over in bed 2  Score total:  34/56     COGNITION: Overall cognitive status: Within functional limits for tasks assessed     SENSATION: WFL  MUSCLE LENGTH: Hamstrings: Left leg tightness tested in sitting   POSTURE: rounded shoulders and increased lumbar lordosis   LUMBAR ROM:   AROM eval 8/18  Flexion FT to below patella FT to mid calf  Extension neutral   Right lateral flexion wfl   Left lateral flexion wfl   Right rotation    Left rotation     (Blank rows = not tested)  LOWER EXTREMITY ROM:     wfl  LOWER EXTREMITY MMT:    HD Lbs in sitting Right eval Left eval  Hip flexion 35.0 17.8  Hip extension    Hip abduction 10.7 9.8  Hip adduction    Hip internal rotation    Hip external rotation    Knee flexion    Knee extension 11.9 12.7  Ankle dorsiflexion    Ankle plantarflexion    Ankle inversion    Ankle eversion     (Blank rows = not tested)  LUMBAR SPECIAL TESTS:  Slump test: Negative  FUNCTIONAL TESTS:  5 times sit to stand: from pool bench 25s Timed up and go (TUG): 16.29  4 stage balance: Passed 1&2. Tandem stance 3-4s; SLS unable GAIT: Distance walked: 400 ft Assistive device utilized: None Level of assistance: Complete Independence Comments: slight core rotation with increased arm swing (for momentum)  TREATMENT    02/10/24 Pt seen for aquatic therapy today.  Treatment took place in water 3.5-4.75 ft in depth at the [facility] pool. Temp of water was 91.  Pt entered/exited the pool via stairs independently in step-to pattern with bilat rail.  * Unsupported:  multiple laps walking forward / backward * unsupported:  side stepping L/R -> with arm add/abdct added RBHB *standing toe raises/heel  raises x 15 *toe walking then heel  walking forward and back x 2 widths each *Standing unsupported : HS curl x10; hip flex/ext x 10; hip add/abd crossing midline x 10 *tandem stance ue RBHB in 3.6 ft holding x 20 sec->add/abd x 10 leading R/L *SLS as above holding x 20s -> ue add/abd x 10. 1 LOB with indep recovery with rle sls *forward and back tandem walking ue support RBHB *suspended vertical noodle wrapped anteriorly knees to chest 2 x 10; hip flex/ext; add/abd; cycling *Plank on bench with hip ext x 5          PATIENT EDUCATION:  Education details: reacquainting to aquatic therapy  Person educated: Patient Education method: Explanation Education comprehension: verbalized understanding  HOME EXERCISE PROGRAM: Pt will bring laminated copy from last session.  Unable to find it in [employer] today. Plan to up-date/modify as appropriate.  ASSESSMENT:  CLINICAL IMPRESSION: Pt forgot original HEP.  Will bring next time. Unable to find it in computer to modify.  She has good toleration to to progressive session with reports of good response to last session. She doe have some limitation to lle sls due to ankle discomfort today but reduces with changing of position.  No fatigue throughout session        From initial evaluation:  Patient is a 53 y.o. f who was seen today for physical therapy evaluation and treatment for Joint  pain due to RA. She is well known to this clinic as she has completed a prior episode x 1 year ago for similar dx.  She presents today with reports of increased LE weakness due to new med Md has put her on. She was unable to gain pool access after episode last year to continue with aquatic HEP. Today she demonstrates le weakness limiting strength , endurance, activity tolerance, gait, balance, and functional mobility with ADL's. Her pain can be variable and does limit some function but less than le weakness.  She will benefit from skilled aquatic PT to improve all areas of deficits, encourage pt to  gain personal pool access with understanding that chronic condition will require consistent management for optimal ability/function.    OBJECTIVE IMPAIRMENTS: Abnormal gait, decreased activity tolerance, decreased balance, decreased endurance, decreased knowledge of condition, decreased knowledge of use of DME, decreased mobility, difficulty walking, decreased ROM, decreased strength, hypomobility, increased edema, impaired perceived functional ability, increased muscle spasms, impaired flexibility, impaired UE functional use, improper body mechanics, postural dysfunction, obesity, and pain.    ACTIVITY LIMITATIONS: carrying, lifting, bending, standing, squatting, stairs, transfers, bed mobility, dressing, reach over head, hygiene/grooming, and locomotion level   PARTICIPATION LIMITATIONS: interpersonal relationship, community activity, occupation, and   difficulty moving around, doing her daily tasks such as working, shopping, going up and down stairs, using her hands, dancing, spending time with grandchildren, getting up and down from the floor, keeping her balance   PERSONAL FACTORS: Fitness, Past/current experiences, Profession, Time since onset of injury/illness/exacerbation, Transportation, and 3+ comorbidities:   anemia, CAD, esophagitis, menorrhagia, NSTEMI (and cardiac cath 2014), active severe Stage 3 rheumatoid arthritis with positive rheumatoid factor are also affecting patient's functional outcome.    REHAB POTENTIAL: Good   CLINICAL DECISION MAKING: Evolving/moderate complexity   EVALUATION COMPLEXITY: Moderate   GOALS: Goals reviewed with patient? Yes  SHORT TERM GOALS: Target date: 01/11/24  Pt will tolerate full aquatic sessions consistently without increase in pain and with improving function to demonstrate good toleration and effectiveness of intervention.  Baseline:TBA Goal status:Met 01/13/24  2.  Pt to gain pool access for indep completion of aquatic HEP Baseline:  list of pool hand out issued Goal status: In progress 01/19/24  3.  Pt will improve lumbar flex to FT to mid calf for reduced tightness in post core. Baseline: see chart Goal status: Met 01/19/24   LONG TERM GOALS: Target date: 02/20/24  Pt to improve on LEFS by at least 9 point to demonstrate statistically significant Improvement in function. Baseline: 34/56 Goal status: INITIAL  2.  Pt will report decrease in pain by at least 50% for improved toleration to activity/quality of life and to demonstrate improved management of pain. Baseline:  Goal status: INITIAL  3.  Pt will improve strength in all areas listed by at least 10 lbs to demonstrate improved overall physical function Baseline:  Goal status: INITIAL  4.  Pt will be indep with final aquatic HEP for continued management of condition Baseline:  Goal status: In progress 02/06/24  5.  Pt will perform SLS unsupported x 10s to demonstrate and improvement in balance as well as LE strength Baseline:  Goal status: INITIAL  6.  Pt will improve on Tug test to <or= 13.5s (13.5=community dwelling adults) to demonstrate improvement in lower extremity function, mobility and decreased fall risk. Baseline: 16.29 Goal status: INITIAL  PLAN:  PT FREQUENCY: 1-2x/week  PT DURATION: 8 weeks  PLANNED INTERVENTIONS: 02835- PT Re-evaluation, 97750- Physical  Performance Testing, 97110-Therapeutic exercises, 97530- Therapeutic activity, [id number]- Neuromuscular re-education, [id number]- Self Care, 02859- Manual therapy, [id number]- Gait training, [id number]- Aquatic Therapy, [phone]- Electrical stimulation (manual), [phone]- Ionotophoresis 4mg /ml Dexamethasone , 20560 (1-2 muscles), 20561 (3+ muscles)- Dry Needling, Patient/Family education, Balance training, Stair training, Taping, Joint mobilization, DME instructions, Cryotherapy, and Moist heat.  PLAN FOR NEXT SESSION: aquatics for [employer] and core strengthening (focus on quad str); balance and proprioceptive retraining;  activity toleration  Ronal Foots) Dermot Gremillion MPT 02/10/24 9:44 AM [facility] [address] [place], [place], [address] Phone: [phone]   Fax:  [phone]     For all possible CPT codes, reference the Planned Interventions line above.     Check all conditions that are expected to impact treatment: {Conditions expected to impact treatment:Morbid obesity and Musculoskeletal disorders   If treatment provided at initial evaluation, no treatment charged due to lack of authorization.

## 2024-02-13 ENCOUNTER — Encounter: Payer: Self-pay | Admitting: Pharmacist

## 2024-02-13 ENCOUNTER — Other Ambulatory Visit: Payer: Self-pay

## 2024-02-13 ENCOUNTER — Ambulatory Visit (HOSPITAL_BASED_OUTPATIENT_CLINIC_OR_DEPARTMENT_OTHER): Admitting: Physical Therapy

## 2024-02-13 DIAGNOSIS — Z419 Encounter for procedure for purposes other than remedying health state, unspecified: Secondary | ICD-10-CM | POA: Diagnosis not present

## 2024-02-17 ENCOUNTER — Ambulatory Visit (HOSPITAL_BASED_OUTPATIENT_CLINIC_OR_DEPARTMENT_OTHER): Admitting: Physical Therapy

## 2024-02-17 ENCOUNTER — Encounter (HOSPITAL_BASED_OUTPATIENT_CLINIC_OR_DEPARTMENT_OTHER): Payer: Self-pay | Admitting: Physical Therapy

## 2024-02-17 DIAGNOSIS — M6281 Muscle weakness (generalized): Secondary | ICD-10-CM | POA: Diagnosis not present

## 2024-02-17 DIAGNOSIS — M25562 Pain in left knee: Secondary | ICD-10-CM | POA: Diagnosis not present

## 2024-02-17 DIAGNOSIS — M25551 Pain in right hip: Secondary | ICD-10-CM | POA: Diagnosis not present

## 2024-02-17 DIAGNOSIS — Z7689 Persons encountering health services in other specified circumstances: Secondary | ICD-10-CM | POA: Diagnosis not present

## 2024-02-17 DIAGNOSIS — G8929 Other chronic pain: Secondary | ICD-10-CM

## 2024-02-17 DIAGNOSIS — M25552 Pain in left hip: Secondary | ICD-10-CM | POA: Diagnosis not present

## 2024-02-17 DIAGNOSIS — M25561 Pain in right knee: Secondary | ICD-10-CM | POA: Diagnosis not present

## 2024-02-17 DIAGNOSIS — R262 Difficulty in walking, not elsewhere classified: Secondary | ICD-10-CM | POA: Diagnosis not present

## 2024-02-17 NOTE — Therapy (Signed)
 OUTPATIENT PHYSICAL THERAPY THORACOLUMBAR TREATMENT   Patient Name: Abigail Garcia MRN: [id number] DOB:07-07-1970, 53 y.o., female Today's Date: 02/17/2024  END OF SESSION:  PT End of Session - 02/17/24 0936     Visit Number 9    Date for PT Re-Evaluation 02/20/24    Authorization Type [employer] mcaid    Authorization Time Period 12/24/23-02/22/24    Authorization - Visit Number 9    Authorization - Number of Visits 10    Progress Note Due on Visit 10    PT Start Time 0934    PT Stop Time 1013    PT Time Calculation (min) 39 min    Activity Tolerance Patient tolerated treatment well    Behavior During Therapy [facility] for tasks assessed/performed           Past Medical History:  Diagnosis Date   Anemia    Blood transfusion without reported diagnosis    CAD (coronary artery disease)    a. 11/2012 NSTEMI/Cath/PCI: LM nl, LAD 80-90 thrombotic (tx with Heparin  x 2 days then aspiration thrombectomy and PTCA), RI nl, LCX sm/nl, RCA nl, EF 55-65%.   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 Echo (6/14) with EF 55-60%.    Hypertension    Menorrhagia    NSTEMI (non-ST elevated myocardial infarction) (HCC)    11/2012    Obesity    RA (rheumatoid arthritis) (HCC)    Past Surgical History:  Procedure Laterality Date   bilateral tubal     BREAST BIOPSY Left    benign   CARDIAC CATHETERIZATION  06/03/2012   CESAREAN SECTION     CORONARY ANGIOGRAM  11/19/2012   Procedure: CORONARY ANGIOGRAM;  Surgeon: Peter M Swaziland, MD;  Location: [facility] CATH LAB;  Service: Cardiovascular;;   DILATION AND CURETTAGE OF UTERUS     DILATION AND CURETTAGE OF UTERUS N/A 03/13/2019   Procedure: DILATATION AND CURETTAGE;  Surgeon: Starla Harland BROCKS, MD;  Location: [facility] OR;  Service: Gynecology;  Laterality: N/A;   INTRAVASCULAR ULTRASOUND  11/19/2012   Procedure: INTRAVASCULAR ULTRASOUND;  Surgeon: Peter M  Swaziland, MD;  Location: [facility] CATH LAB;  Service: Cardiovascular;;   LEFT HEART CATHETERIZATION WITH CORONARY ANGIOGRAM N/A 11/16/2012   Procedure: LEFT HEART CATHETERIZATION WITH CORONARY ANGIOGRAM;  Surgeon: Peter M Swaziland, MD;  Location: [facility] CATH LAB;  Service: Cardiovascular;  Laterality: N/A;   PERCUTANEOUS CORONARY INTERVENTION-BALLOON ONLY  11/19/2012   Procedure: PERCUTANEOUS CORONARY INTERVENTION-BALLOON ONLY;  Surgeon: Peter M Swaziland, MD;  Location: [facility] CATH LAB;  Service: Cardiovascular;;   Patient Active Problem List   Diagnosis Date Noted   Dysplasia of cervix, low grade (CIN 1) 07/10/2023   ASCUS with positive high risk HPV on pap 06/10/2023 06/13/2023   Pressure in head 04/08/2023   COVID-19 vaccine series declined 10/22/2022   Dysfunctional uterine bleeding 10/22/2022   Epidermal inclusion cyst 10/22/2022   Healthcare maintenance 10/22/2022   Epidermal cyst 04/24/2021   Symptomatic anemia 03/12/2019   Hirsutism 04/17/2017   Prediabetes 08/18/2015   Blurry vision, bilateral 08/18/2015   Abnormal uterine bleeding (AUB) 08/11/2015   Obesity 11/11/2013   Thrombocytosis 08/31/2013   Ganglion cyst of wrist 06/12/2013   Hyperlipidemia 12/03/2012   CAD S/P percutaneous coronary angioplasty 11/30/2012   Rheumatoid arthritis involving multiple sites with positive rheumatoid factor (HCC) 11/14/2012   Anemia, iron  deficiency  11/14/2012    PCP: Sharma Coyer, MD   REFERRING PROVIDER: Sharma Coyer, MD   REFERRING DIAG: M05.79 (ICD-10-CM) - Rheumatoid arthritis involving multiple sites with positive rheumatoid factor (HCC)   Rationale for Evaluation and Treatment: Rehabilitation  THERAPY DIAG:  Muscle weakness (generalized)  Chronic pain of both hips  Chronic pain of both knees  ONSET DATE: chronic  SUBJECTIVE:                                                                                                                                                                                            SUBJECTIVE STATEMENT:  Pt found old HEP.  States pain 4/10  POOL ACCESS: currently none, but plans to [facility]  PERTINENT HISTORY:  Request for water aerobics to help with RA, joint pains, requesting 2-4 times weekly sessions to help relieve joint pain and reduce weight, current BMI 44.46   PAIN:  Are you having pain? Yes: NPRS scale:  5/10 Pain location: core and Rt ankle Pain description: ache, sore Aggravating factors: stress physical and mental, walking 15 minutes Relieving factors: rest, music; gardening  PRECAUTIONS: None  RED FLAGS: None   WEIGHT BEARING RESTRICTIONS: No  FALLS:  Has patient fallen in last 6 months? No  LIVING ENVIRONMENT: Lives with: lives with their family Lives in: House/apartment Stairs: Yes: Internal: 16 steps; on right going up Has following equipment at home: None  OCCUPATION: not working  PLOF: Independent  PATIENT GOALS: strengthening of le  NEXT MD VISIT: as needed  OBJECTIVE:  Note: Objective measures were completed at Evaluation unless otherwise noted.  DIAGNOSTIC FINDINGS:  None recent in chart  PATIENT SURVEYS:  LEFS  Extreme difficulty/unable (0), Quite a bit of difficulty (1), Moderate difficulty (2), Little difficulty (3), No difficulty (4) Survey date:  12/24/23  Any of your usual work, housework or school activities 2  2. Usual hobbies, recreational or sporting activities 3  3. Getting into/out of the bath 2  4. Walking between rooms 3  5. Putting on socks/shoes 2  6. Squatting  1  7. Lifting an object, like a bag of groceries from the floor 2  8. Performing light activities around your home 2  9. Performing heavy activities around your home 1  10. Getting into/out of a car 2  11. Walking 2 blocks 2  12. Walking 1 mile 1  13. Going up/down 10 stairs (1 flight) 2  14. Standing for 1 hour 1  15.  sitting for 1 hour 3  16. Running on even  ground 1  17. Running on uneven ground 1  18. Making sharp turns while running fast 1  19. Hopping  0  20. Rolling over in bed 2  Score total:  34/56     COGNITION: Overall cognitive status: Within functional limits for tasks assessed     SENSATION: WFL  MUSCLE LENGTH: Hamstrings: Left leg tightness tested in sitting   POSTURE: rounded shoulders and increased lumbar lordosis   LUMBAR ROM:   AROM eval 8/18  Flexion FT to below patella FT to mid calf  Extension neutral   Right lateral flexion wfl   Left lateral flexion wfl   Right rotation    Left rotation     (Blank rows = not tested)  LOWER EXTREMITY ROM:     wfl  LOWER EXTREMITY MMT:    HD Lbs in sitting Right eval Left eval  Hip flexion 35.0 17.8  Hip extension    Hip abduction 10.7 9.8  Hip adduction    Hip internal rotation    Hip external rotation    Knee flexion    Knee extension 11.9 12.7  Ankle dorsiflexion    Ankle plantarflexion    Ankle inversion    Ankle eversion     (Blank rows = not tested)  LUMBAR SPECIAL TESTS:  Slump test: Negative  FUNCTIONAL TESTS:  5 times sit to stand: from pool bench 25s Timed up and go (TUG): 16.29  4 stage balance: Passed 1&2. Tandem stance 3-4s; SLS unable GAIT: Distance walked: 400 ft Assistive device utilized: None Level of assistance: Complete Independence Comments: slight core rotation with increased arm swing (for momentum)  TREATMENT    02/10/24 Pt seen for aquatic therapy today.  Treatment took place in water 3.5-4.75 ft in depth at the [facility] pool. Temp of water was 91.  Pt entered/exited the pool via stairs independently in step-to pattern with bilat rail.  * Unsupported:  multiple laps walking forward / backward * unsupported:  side stepping L/R -> with arm add/abdct added RBHB *standing toe raises/heel  raises x 15 *toe walking then heel walking forward and back x 2 widths each *Standing unsupported : HS curl x10; hip  flex/ext x 10; hip add/abd crossing midline x 10 *tandem stance ue RBHB in 3.6 ft holding x 20 sec->add/abd x 10 leading R/L *SLS as above holding x 20s -> ue add/abd x 10. 1 LOB with indep recovery with rle sls *Plank on bench with hip ext 2x 5 *forward and back tandem walking ue support RBHB *suspended vertical noodle wrapped anteriorly knees to chest 2 x 10; hip flex/ext; add/abd; cycling      PATIENT EDUCATION:  Education details: reacquainting to aquatic therapy  Person educated: Patient Education method: Explanation Education comprehension: verbalized understanding  HOME EXERCISE PROGRAM: Access Code: [email or web address] URL: [email or web address] Date: 02/17/2024 Prepared by: Frankie Fahed Morten  Updated Access Code: [email or web address] URL: [email or web address] Date: 02/17/2024 Prepared by: Frankie Vanna Shavers  Exercises - Side lunge with hand buoys  - 1 x daily - 1-3 x weekly - Heel Toe Raises at Pool Wall  - 1 x daily - 1-3 x weekly - 1-2 sets - 10 reps - Toe Walking  - 1 x daily - 1-3 x weekly - Heel Walking  - 1 x daily - 1-3 x weekly - Leg swings flex/ext  - 1 x daily - 1-3 x weekly - 1-2 sets - 10 reps - Leg Swing out to the side  - 1 x daily - 1-3 x weekly - 1-2 sets - 10 reps - Tandem Stance  -  1 x daily - 1-3 x weekly - 1-2 sets - 10 reps - Tandem Walking  - 1 x daily - 1-3 x weekly - Shoulder Extension with Pool Noodle Pull Down  - 1 x daily - 1-3 x weekly - 1-2 sets - 10 reps - Single Leg Stance  - 1 x daily - 1-3 x weekly - 1 sets - 1-3 reps - 20s hold - Cycling on noodle/noodle wrapped under shoulders  ASSESSMENT:  CLINICAL IMPRESSION: Next session plan to complete DC testing. Pt present with copy of old aquatic HEP.  Program updated and printed with plan to instruct and issue next/last session.  She requires a few extra rest periods today due to slight increase in ankle discomfort. Appears and reports tiredness/fatigue increased today more so than  normal. She puts forth good effort and completes session without increase in pain nor LOB.  Cues for added mindfulness to control movements avoiding poor execution.  Plan to DC next session.          From initial evaluation:  Patient is a 53 y.o. f who was seen today for physical therapy evaluation and treatment for Joint pain due to RA. She is well known to this clinic as she has completed a prior episode x 1 year ago for similar dx.  She presents today with reports of increased LE weakness due to new med Md has put her on. She was unable to gain pool access after episode last year to continue with aquatic HEP. Today she demonstrates le weakness limiting strength , endurance, activity tolerance, gait, balance, and functional mobility with ADL's. Her pain can be variable and does limit some function but less than le weakness.  She will benefit from skilled aquatic PT to improve all areas of deficits, encourage pt to gain personal pool access with understanding that chronic condition will require consistent management for optimal ability/function.    OBJECTIVE IMPAIRMENTS: Abnormal gait, decreased activity tolerance, decreased balance, decreased endurance, decreased knowledge of condition, decreased knowledge of use of DME, decreased mobility, difficulty walking, decreased ROM, decreased strength, hypomobility, increased edema, impaired perceived functional ability, increased muscle spasms, impaired flexibility, impaired UE functional use, improper body mechanics, postural dysfunction, obesity, and pain.    ACTIVITY LIMITATIONS: carrying, lifting, bending, standing, squatting, stairs, transfers, bed mobility, dressing, reach over head, hygiene/grooming, and locomotion level   PARTICIPATION LIMITATIONS: interpersonal relationship, community activity, occupation, and   difficulty moving around, doing her daily tasks such as working, shopping, going up and down stairs, using her hands, dancing,  spending time with grandchildren, getting up and down from the floor, keeping her balance   PERSONAL FACTORS: Fitness, Past/current experiences, Profession, Time since onset of injury/illness/exacerbation, Transportation, and 3+ comorbidities:   anemia, CAD, esophagitis, menorrhagia, NSTEMI (and cardiac cath 2014), active severe Stage 3 rheumatoid arthritis with positive rheumatoid factor are also affecting patient's functional outcome.    REHAB POTENTIAL: Good   CLINICAL DECISION MAKING: Evolving/moderate complexity   EVALUATION COMPLEXITY: Moderate   GOALS: Goals reviewed with patient? Yes  SHORT TERM GOALS: Target date: 01/11/24  Pt will tolerate full aquatic sessions consistently without increase in pain and with improving function to demonstrate good toleration and effectiveness of intervention.  Baseline:TBA Goal status:Met 01/13/24  2.  Pt to gain pool access for indep completion of aquatic HEP Baseline: list of pool hand out issued Goal status: In progress 01/19/24  3.  Pt will improve lumbar flex to FT to mid calf for reduced tightness in post  core. Baseline: see chart Goal status: Met 01/19/24   LONG TERM GOALS: Target date: 02/20/24  Pt to improve on LEFS by at least 9 point to demonstrate statistically significant Improvement in function. Baseline: 34/56 Goal status: INITIAL  2.  Pt will report decrease in pain by at least 50% for improved toleration to activity/quality of life and to demonstrate improved management of pain. Baseline:  Goal status: INITIAL  3.  Pt will improve strength in all areas listed by at least 10 lbs to demonstrate improved overall physical function Baseline:  Goal status: INITIAL  4.  Pt will be indep with final aquatic HEP for continued management of condition Baseline:  Goal status: In progress 02/06/24  5.  Pt will perform SLS unsupported x 10s to demonstrate and improvement in balance as well as LE strength Baseline:  Goal status:  INITIAL  6.  Pt will improve on Tug test to <or= 13.5s (13.5=community dwelling adults) to demonstrate improvement in lower extremity function, mobility and decreased fall risk. Baseline: 16.29 Goal status: INITIAL  PLAN:  PT FREQUENCY: 1-2x/week  PT DURATION: 8 weeks  PLANNED INTERVENTIONS: 97164- PT Re-evaluation, 97750- Physical Performance Testing, 97110-Therapeutic exercises, 97530- Therapeutic activity, [id number]- Neuromuscular re-education, 97535- Self Care, [id number]- Manual therapy, [id number]- Gait training, [phone]- Aquatic Therapy, [phone]- Electrical stimulation (manual), [phone]- Ionotophoresis 4mg /ml Dexamethasone , 79439 (1-2 muscles), [id number] (3+ muscles)- Dry Needling, Patient/Family education, Balance training, Stair training, Taping, Joint mobilization, DME instructions, Cryotherapy, and Moist heat.  PLAN FOR NEXT SESSION: aquatics for [employer] and core strengthening (focus on quad str); balance and proprioceptive retraining; activity toleration  Ronal Foots) Tieisha Darden MPT 02/17/24 9:37 AM [facility] [address] [place], [place], [address] Phone: [phone]   Fax:  [phone]     For all possible CPT codes, reference the Planned Interventions line above.     Check all conditions that are expected to impact treatment: {Conditions expected to impact treatment:Morbid obesity and Musculoskeletal disorders   If treatment provided at initial evaluation, no treatment charged due to lack of authorization.

## 2024-02-19 ENCOUNTER — Ambulatory Visit (HOSPITAL_BASED_OUTPATIENT_CLINIC_OR_DEPARTMENT_OTHER): Admitting: Physical Therapy

## 2024-02-19 ENCOUNTER — Encounter (HOSPITAL_BASED_OUTPATIENT_CLINIC_OR_DEPARTMENT_OTHER): Payer: Self-pay | Admitting: Physical Therapy

## 2024-02-19 DIAGNOSIS — M25562 Pain in left knee: Secondary | ICD-10-CM | POA: Diagnosis not present

## 2024-02-19 DIAGNOSIS — G8929 Other chronic pain: Secondary | ICD-10-CM

## 2024-02-19 DIAGNOSIS — M25552 Pain in left hip: Secondary | ICD-10-CM | POA: Diagnosis not present

## 2024-02-19 DIAGNOSIS — Z7689 Persons encountering health services in other specified circumstances: Secondary | ICD-10-CM | POA: Diagnosis not present

## 2024-02-19 DIAGNOSIS — M25561 Pain in right knee: Secondary | ICD-10-CM | POA: Diagnosis not present

## 2024-02-19 DIAGNOSIS — M6281 Muscle weakness (generalized): Secondary | ICD-10-CM | POA: Diagnosis not present

## 2024-02-19 DIAGNOSIS — M25551 Pain in right hip: Secondary | ICD-10-CM | POA: Diagnosis not present

## 2024-02-19 DIAGNOSIS — R262 Difficulty in walking, not elsewhere classified: Secondary | ICD-10-CM | POA: Diagnosis not present

## 2024-02-19 NOTE — Therapy (Signed)
 OUTPATIENT PHYSICAL THERAPY THORACOLUMBAR TREATMENT PHYSICAL THERAPY DISCHARGE SUMMARY  Visits from Start of Care: 10  Current functional level related to goals / functional outcomes: indep   Remaining deficits: Chronic pain   Education / Equipment: Management of condition; HEP   Patient agrees to discharge. Patient goals were partially met. Patient is being discharged due to maximized rehab potential.    Patient Name: Abigail Garcia MRN: [id number] DOB:[birth year], 53 y.o., female Today's Date: 02/19/2024  END OF SESSION:  PT End of Session - 02/19/24 0942     Visit Number 10    Date for PT Re-Evaluation 02/20/24    Authorization Type [employer] mcaid    Authorization Time Period 12/24/23-02/22/24    Authorization - Visit Number 10    Authorization - Number of Visits 10    Progress Note Due on Visit 10    PT Start Time 0930    PT Stop Time 1010    PT Time Calculation (min) 40 min    Activity Tolerance Patient tolerated treatment well    Behavior During Therapy [facility] for tasks assessed/performed           Past Medical History:  Diagnosis Date   Anemia    Blood transfusion without reported diagnosis    CAD (coronary artery disease)    a. 11/2012 NSTEMI/Cath/PCI: LM nl, LAD 80-90 thrombotic (tx with Heparin  x 2 days then aspiration thrombectomy and PTCA), RI nl, LCX sm/nl, RCA nl, EF 55-65%.   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 Echo (6/14) with EF 55-60%.    Hypertension    Menorrhagia    NSTEMI (non-ST elevated myocardial infarction) (HCC)    11/2012    Obesity    RA (rheumatoid arthritis) (HCC)    Past Surgical History:  Procedure Laterality Date   bilateral tubal     BREAST BIOPSY Left    benign   CARDIAC CATHETERIZATION  06/03/2012   CESAREAN SECTION     CORONARY ANGIOGRAM  11/19/2012   Procedure: CORONARY ANGIOGRAM;  Surgeon: Peter M Swaziland, MD;   Location: [facility] CATH LAB;  Service: Cardiovascular;;   DILATION AND CURETTAGE OF UTERUS     DILATION AND CURETTAGE OF UTERUS N/A 03/13/2019   Procedure: DILATATION AND CURETTAGE;  Surgeon: Starla Harland BROCKS, MD;  Location: [facility] OR;  Service: Gynecology;  Laterality: N/A;   INTRAVASCULAR ULTRASOUND  11/19/2012   Procedure: INTRAVASCULAR ULTRASOUND;  Surgeon: Peter M Swaziland, MD;  Location: [facility] CATH LAB;  Service: Cardiovascular;;   LEFT HEART CATHETERIZATION WITH CORONARY ANGIOGRAM N/A 11/16/2012   Procedure: LEFT HEART CATHETERIZATION WITH CORONARY ANGIOGRAM;  Surgeon: Peter M Swaziland, MD;  Location: [facility] CATH LAB;  Service: Cardiovascular;  Laterality: N/A;   PERCUTANEOUS CORONARY INTERVENTION-BALLOON ONLY  11/19/2012   Procedure: PERCUTANEOUS CORONARY INTERVENTION-BALLOON ONLY;  Surgeon: Peter M Swaziland, MD;  Location: [facility] CATH LAB;  Service: Cardiovascular;;   Patient Active Problem List   Diagnosis Date Noted   Dysplasia of cervix, low grade (CIN 1) 07/10/2023   ASCUS with positive high risk HPV on pap 06/10/2023 06/13/2023   Pressure in head 04/08/2023   COVID-19 vaccine series declined 10/22/2022   Dysfunctional uterine bleeding 10/22/2022   Epidermal inclusion cyst 10/22/2022   Healthcare maintenance 10/22/2022   Epidermal cyst 04/24/2021   Symptomatic anemia 03/12/2019   Hirsutism 04/17/2017   Prediabetes 08/18/2015  Blurry vision, bilateral 08/18/2015   Abnormal uterine bleeding (AUB) 08/11/2015   Obesity 11/11/2013   Thrombocytosis 08/31/2013   Ganglion cyst of wrist 06/12/2013   Hyperlipidemia 12/03/2012   CAD S/P percutaneous coronary angioplasty 11/30/2012   Rheumatoid arthritis involving multiple sites with positive rheumatoid factor (HCC) 11/14/2012   Anemia, iron  deficiency 11/14/2012    PCP: Sharma Coyer, MD   REFERRING PROVIDER: Sharma Coyer, MD   REFERRING DIAG: M05.79 (ICD-10-CM) - Rheumatoid arthritis involving multiple sites with positive  rheumatoid factor (HCC)   Rationale for Evaluation and Treatment: Rehabilitation  THERAPY DIAG:  Muscle weakness (generalized)  Chronic pain of both hips  Chronic pain of both knees  ONSET DATE: chronic  SUBJECTIVE:                                                                                                                                                                                           SUBJECTIVE STATEMENT:  Pt reports not really having any pain today 0/10  POOL ACCESS: currently none, but plans to [facility]  PERTINENT HISTORY:  Request for water aerobics to help with RA, joint pains, requesting 2-4 times weekly sessions to help relieve joint pain and reduce weight, current BMI 44.46   PAIN:  Are you having pain? Yes: NPRS scale:  5/10 Pain location: core and Rt ankle Pain description: ache, sore Aggravating factors: stress physical and mental, walking 15 minutes Relieving factors: rest, music; gardening  PRECAUTIONS: None  RED FLAGS: None   WEIGHT BEARING RESTRICTIONS: No  FALLS:  Has patient fallen in last 6 months? No  LIVING ENVIRONMENT: Lives with: lives with their family Lives in: House/apartment Stairs: Yes: Internal: 16 steps; on right going up Has following equipment at home: None  OCCUPATION: not working  PLOF: Independent  PATIENT GOALS: strengthening of le  NEXT MD VISIT: as needed  OBJECTIVE:  Note: Objective measures were completed at Evaluation unless otherwise noted.  DIAGNOSTIC FINDINGS:  None recent in chart  PATIENT SURVEYS:  LEFS  Extreme difficulty/unable (0), Quite a bit of difficulty (1), Moderate difficulty (2), Little difficulty (3), No difficulty (4) Survey date:  12/24/23  Any of your usual work, housework or school activities 2  2. Usual hobbies, recreational or sporting activities 3  3. Getting into/out of the bath 2  4. Walking between rooms 3  5. Putting on socks/shoes 2  6.  Squatting  1  7. Lifting an object, like a bag of groceries from the floor 2  8. Performing light activities around your home 2  9. Performing heavy activities around your home 1  10.  Getting into/out of a car 2  11. Walking 2 blocks 2  12. Walking 1 mile 1  13. Going up/down 10 stairs (1 flight) 2  14. Standing for 1 hour 1  15.  sitting for 1 hour 3  16. Running on even ground 1  17. Running on uneven ground 1  18. Making sharp turns while running fast 1  19. Hopping  0  20. Rolling over in bed 2  Score total:  34/56     COGNITION: Overall cognitive status: Within functional limits for tasks assessed     SENSATION: WFL  MUSCLE LENGTH: Hamstrings: Left leg tightness tested in sitting   POSTURE: rounded shoulders and increased lumbar lordosis   LUMBAR ROM:   AROM eval 8/18  Flexion FT to below patella FT to mid calf  Extension neutral   Right lateral flexion wfl   Left lateral flexion wfl   Right rotation    Left rotation     (Blank rows = not tested)  LOWER EXTREMITY ROM:     wfl  LOWER EXTREMITY MMT:    HD Lbs in sitting Right eval Left eval R /L 02/19/24  Hip flexion 35.0 17.8 32.4 / 15  Hip extension     Hip abduction 10.7 9.8 12.5 / 14.1  Hip adduction     Hip internal rotation     Hip external rotation     Knee flexion     Knee extension 11.9 12.7 15.5 / 10.2  Ankle dorsiflexion     Ankle plantarflexion     Ankle inversion     Ankle eversion      (Blank rows = not tested)  LUMBAR SPECIAL TESTS:  Slump test: Negative  FUNCTIONAL TESTS:  5 times sit to stand: from pool bench 25s Timed up and go (TUG): 16.29  4 stage balance: Passed 1&2. Tandem stance 3-4s; SLS unable    02/19/24: TUG 14.27        SLS 12 s GAIT: Distance walked: 400 ft Assistive device utilized: None Level of assistance: Complete Independence Comments: slight core rotation with increased arm swing (for momentum)  TREATMENT    02/19/24 Pt seen for aquatic therapy  today.  Treatment took place in water 3.5-4.75 ft in depth at the [facility] pool. Temp of water was 91.  Pt entered/exited the pool via stairs independently in step-to pattern with bilat rail.  - Side lunge with hand buoys   - Heel Toe Raises at [employer]  - Toe Walking   - Heel Walking   - Leg swings flex/ext  - Leg Swing out to the side   - Tandem Stance  - Tandem Walking  - Shoulder Extension with Pool Noodle Pull Down   - Single Leg Stance  - Cycling on noodle/noodle wrapped under shoulders      PATIENT EDUCATION:  Education details: reacquainting to aquatic therapy  Person educated: Patient Education method: Explanation Education comprehension: verbalized understanding  HOME EXERCISE PROGRAM: Access Code: [email or web address] URL: [email or web address] Date: 02/17/2024 Prepared by: Frankie Huntington Leverich  Updated Access Code: [email or web address] URL: [email or web address] Date: 02/17/2024 Prepared by: Frankie Savahanna Almendariz  Exercises - Side lunge with hand buoys  - 1 x daily - 1-3 x weekly - Heel Toe Raises at [facility] Wall  - 1 x daily - 1-3 x weekly - 1-2 sets - 10 reps - Toe Walking  - 1 x daily - 1-3 x weekly - Heel Walking  - 1 x daily - 1-3 x weekly -  Leg swings flex/ext  - 1 x daily - 1-3 x weekly - 1-2 sets - 10 reps - Leg Swing out to the side  - 1 x daily - 1-3 x weekly - 1-2 sets - 10 reps - Tandem Stance  - 1 x daily - 1-3 x weekly - 1-2 sets - 10 reps - Tandem Walking  - 1 x daily - 1-3 x weekly - Shoulder Extension with Pool Noodle Pull Down  - 1 x daily - 1-3 x weekly - 1-2 sets - 10 reps - Single Leg Stance  - 1 x daily - 1-3 x weekly - 1 sets - 1-3 reps - 20s hold - Cycling on noodle/noodle wrapped under shoulders  ASSESSMENT:  CLINICAL IMPRESSION: Pt reports reduction in overall pain by about 30% although not meeting goal pt progressed well in area of chronic issue.  She reports she does have pool access. DC testing complete. She met majority of  goals although did not show an improvement generally in strength as I believe pt apprehensive when completing test not to spike her pain. Balance and LEFS goals met and exceeded as evidence of improved strength. She is instructed in and issued final aquatic HEP.  Give VC and written clarifications for execution.     OBJECTIVE IMPAIRMENTS: Abnormal gait, decreased activity tolerance, decreased balance, decreased endurance, decreased knowledge of condition, decreased knowledge of use of DME, decreased mobility, difficulty walking, decreased ROM, decreased strength, hypomobility, increased edema, impaired perceived functional ability, increased muscle spasms, impaired flexibility, impaired UE functional use, improper body mechanics, postural dysfunction, obesity, and pain.    ACTIVITY LIMITATIONS: carrying, lifting, bending, standing, squatting, stairs, transfers, bed mobility, dressing, reach over head, hygiene/grooming, and locomotion level   PARTICIPATION LIMITATIONS: interpersonal relationship, community activity, occupation, and   difficulty moving around, doing her daily tasks such as working, shopping, going up and down stairs, using her hands, dancing, spending time with grandchildren, getting up and down from the floor, keeping her balance   PERSONAL FACTORS: Fitness, Past/current experiences, Profession, Time since onset of injury/illness/exacerbation, Transportation, and 3+ comorbidities:   anemia, CAD, esophagitis, menorrhagia, NSTEMI (and cardiac cath 2014), active severe Stage 3 rheumatoid arthritis with positive rheumatoid factor are also affecting patient's functional outcome.    REHAB POTENTIAL: Good   CLINICAL DECISION MAKING: Evolving/moderate complexity   EVALUATION COMPLEXITY: Moderate   GOALS: Goals reviewed with patient? Yes  SHORT TERM GOALS: Target date: 01/11/24  Pt will tolerate full aquatic sessions consistently without increase in pain and with improving function to  demonstrate good toleration and effectiveness of intervention.  Baseline:TBA Goal status:Met 01/13/24  2.  Pt to gain pool access for indep completion of aquatic HEP Baseline: list of pool hand out issued Goal status: In progress 01/19/24; Met 02/19/24  3.  Pt will improve lumbar flex to FT to mid calf for reduced tightness in post core. Baseline: see chart Goal status: Met 01/19/24   LONG TERM GOALS: Target date: 02/20/24  Pt to improve on LEFS by at least 9 point to demonstrate statistically significant Improvement in function. Baseline: 34/80; 48/80 Goal status: Met 02/19/24  2.  Pt will report decrease in pain by at least 50% for improved toleration to activity/quality of life and to demonstrate improved management of pain. Baseline:  Goal status: Progress made but not met 02/19/24  3.  Pt will improve strength in all areas listed by at least 10 lbs to demonstrate improved overall physical function Baseline:  Goal status: Not met  02/19/24  4.  Pt will be indep with final aquatic HEP for continued management of condition Baseline:  Goal status: In progress 02/06/24; Met 02/19/24  5.  Pt will perform SLS unsupported x 10s to demonstrate and improvement in balance as well as LE strength Baseline:  Goal status: Met 02/19/24  6.  Pt will improve on Tug test to <or= 13.5s (13.5=community dwelling adults) to demonstrate improvement in lower extremity function, mobility and decreased fall risk. Baseline: 16.29; 14.27 Goal status: Improved but Not Met 02/19/24  PLAN:  PT FREQUENCY: 1-2x/week  PT DURATION: 8 weeks  PLANNED INTERVENTIONS: 97164- PT Re-evaluation, 97750- Physical Performance Testing, 97110-Therapeutic exercises, 97530- Therapeutic activity, 97112- Neuromuscular re-education, 97535- Self Care, 02859- Manual therapy, [phone]- Gait training, [phone]- Aquatic Therapy, [phone]- Electrical stimulation (manual), [phone]- Ionotophoresis 4mg /ml Dexamethasone , 79439 (1-2 muscles), 20561 (3+  muscles)- Dry Needling, Patient/Family education, Balance training, Stair training, Taping, Joint mobilization, DME instructions, Cryotherapy, and Moist heat.  PLAN FOR NEXT SESSION: DC  Ronal Foots) Sailor Hevia MPT 02/19/24 9:43 AM [facility] [address] [place], [place], [address] Phone: [phone]   Fax:  [phone]     For all possible CPT codes, reference the Planned Interventions line above.     Check all conditions that are expected to impact treatment: {Conditions expected to impact treatment:Morbid obesity and Musculoskeletal disorders   If treatment provided at initial evaluation, no treatment charged due to lack of authorization.

## 2024-02-23 ENCOUNTER — Other Ambulatory Visit (HOSPITAL_COMMUNITY): Payer: Self-pay

## 2024-02-23 MED ORDER — NORETHINDRONE ACETATE 5 MG PO TABS
10.0000 mg | ORAL_TABLET | Freq: Every day | ORAL | 3 refills | Status: AC
  Filled 2024-05-28: qty 60, 30d supply, fill #0

## 2024-02-23 MED ORDER — VITAMIN D (ERGOCALCIFEROL) 1.25 MG (50000 UNIT) PO CAPS
50000.0000 [IU] | ORAL_CAPSULE | ORAL | 2 refills | Status: AC
  Filled 2024-03-30: qty 4, 28d supply, fill #0
  Filled 2024-05-28: qty 4, 28d supply, fill #1

## 2024-02-23 MED ORDER — EZETIMIBE 10 MG PO TABS
10.0000 mg | ORAL_TABLET | Freq: Every day | ORAL | 4 refills | Status: AC
  Filled 2024-05-28: qty 90, 90d supply, fill #0

## 2024-02-23 MED ORDER — CLINDAMYCIN PHOSPHATE 1 % EX LOTN: 1.0000 | TOPICAL_LOTION | CUTANEOUS | 3 refills | Status: AC | PRN

## 2024-02-23 MED ORDER — BUSPIRONE HCL 10 MG PO TABS: 10.0000 mg | ORAL_TABLET | Freq: Two times a day (BID) | ORAL | 3 refills | Status: DC

## 2024-02-23 MED ORDER — OZEMPIC (1 MG/DOSE) 4 MG/3ML ~~LOC~~ SOPN: 1.0000 mg | PEN_INJECTOR | SUBCUTANEOUS | 0 refills | Status: DC

## 2024-02-23 MED ORDER — SEMAGLUTIDE-WEIGHT MANAGEMENT 1 MG/0.5ML ~~LOC~~ SOAJ: 1.0000 mg | SUBCUTANEOUS | 2 refills | Status: DC

## 2024-02-23 MED ORDER — SEMAGLUTIDE-WEIGHT MANAGEMENT 0.25 MG/0.5ML ~~LOC~~ SOAJ: 0.2500 mg | SUBCUTANEOUS | 0 refills | Status: DC

## 2024-02-24 ENCOUNTER — Other Ambulatory Visit (HOSPITAL_COMMUNITY): Payer: Self-pay

## 2024-02-25 ENCOUNTER — Other Ambulatory Visit (HOSPITAL_COMMUNITY): Payer: Self-pay

## 2024-02-26 ENCOUNTER — Other Ambulatory Visit (HOSPITAL_COMMUNITY): Payer: Self-pay

## 2024-02-26 MED ORDER — REPATHA SURECLICK 140 MG/ML ~~LOC~~ SOAJ
140.0000 mg | SUBCUTANEOUS | 6 refills | Status: DC
  Filled 2024-03-30: qty 2, 28d supply, fill #0

## 2024-03-03 ENCOUNTER — Other Ambulatory Visit (HOSPITAL_COMMUNITY): Payer: Self-pay

## 2024-03-04 ENCOUNTER — Telehealth (HOSPITAL_COMMUNITY): Payer: Self-pay | Admitting: Pharmacy Technician

## 2024-03-04 ENCOUNTER — Other Ambulatory Visit (HOSPITAL_COMMUNITY): Payer: Self-pay

## 2024-03-04 ENCOUNTER — Telehealth (HOSPITAL_COMMUNITY): Payer: Self-pay

## 2024-03-04 NOTE — Telephone Encounter (Signed)
 Pharmacy Patient Advocate Encounter   Received notification from Pt Calls Messages that prior authorization for Wegovy  1.7 mg/0.75 ml auto injectors is required/requested.   Insurance verification completed.   The patient is insured through [employer].   Effective October 1st, Medicaid will discontinue coverage of GLP1 medications for weight loss (such as Wegovy  and Zepbound), unless the patient has a documented history of a heart attack or stroke. Zepbound will continue to be covered only for patients with moderate to severe sleep apnea (AHI 15-30). Because of this change, the prior authorization team will not be submitting new PA requests for GLP1 medications prescribed for weight loss between now and October 1st, as patients will be unable to continue therapy under Medicaid coverage.

## 2024-03-08 ENCOUNTER — Other Ambulatory Visit (HOSPITAL_COMMUNITY): Payer: Self-pay

## 2024-03-17 ENCOUNTER — Telehealth: Payer: Self-pay

## 2024-03-17 ENCOUNTER — Other Ambulatory Visit (HOSPITAL_COMMUNITY): Payer: Self-pay

## 2024-03-17 NOTE — Telephone Encounter (Signed)
 Pharmacy Patient Advocate Encounter   Received notification from Pt Calls Messages that prior authorization for Wegovy  1.7mg /0.38ml Pen is required/requested.   Insurance verification completed.   The patient is insured through [facility].   Per test claim: PA required; PA submitted to above mentioned insurance via Latent Key/confirmation #/EOC [facility] Status is pending

## 2024-03-17 NOTE — Telephone Encounter (Signed)
 Apologies for the delay, and yes, I do see that pt suffered a MI in 2014 in her chart notes! Thank you for catching that, I've submitted the request and it is currently pending with insurance. Thank you again!

## 2024-03-17 NOTE — Telephone Encounter (Signed)
 Pharmacy Patient Advocate Encounter  Received notification from [facility] MEDICAID that Prior Authorization for Wegovy  has been APPROVED from 03/17/2024 to 09/13/2024   PA #/Case ID/Reference #: [id number]

## 2024-03-17 NOTE — Telephone Encounter (Signed)
 PA has been approved for Wegovy , thanks!

## 2024-03-30 ENCOUNTER — Other Ambulatory Visit: Payer: Self-pay

## 2024-03-30 ENCOUNTER — Other Ambulatory Visit (HOSPITAL_COMMUNITY): Payer: Self-pay

## 2024-04-06 ENCOUNTER — Ambulatory Visit: Admitting: Cardiology

## 2024-04-20 DIAGNOSIS — Z7689 Persons encountering health services in other specified circumstances: Secondary | ICD-10-CM | POA: Diagnosis not present

## 2024-05-05 ENCOUNTER — Other Ambulatory Visit (HOSPITAL_COMMUNITY): Payer: Self-pay

## 2024-05-05 ENCOUNTER — Encounter: Payer: Self-pay | Admitting: Family Medicine

## 2024-05-05 ENCOUNTER — Other Ambulatory Visit: Payer: Self-pay

## 2024-05-05 ENCOUNTER — Ambulatory Visit: Admitting: Family Medicine

## 2024-05-05 VITALS — BP 122/80 | HR 84 | Ht 63.0 in | Wt 251.0 lb

## 2024-05-05 DIAGNOSIS — I2510 Atherosclerotic heart disease of native coronary artery without angina pectoris: Secondary | ICD-10-CM

## 2024-05-05 DIAGNOSIS — Z9861 Coronary angioplasty status: Secondary | ICD-10-CM | POA: Diagnosis not present

## 2024-05-05 DIAGNOSIS — M0579 Rheumatoid arthritis with rheumatoid factor of multiple sites without organ or systems involvement: Secondary | ICD-10-CM

## 2024-05-05 DIAGNOSIS — E66813 Obesity, class 3: Secondary | ICD-10-CM | POA: Diagnosis not present

## 2024-05-05 DIAGNOSIS — M25552 Pain in left hip: Secondary | ICD-10-CM

## 2024-05-05 DIAGNOSIS — I252 Old myocardial infarction: Secondary | ICD-10-CM | POA: Diagnosis not present

## 2024-05-05 DIAGNOSIS — Z6841 Body Mass Index (BMI) 40.0 and over, adult: Secondary | ICD-10-CM

## 2024-05-05 MED ORDER — SEMAGLUTIDE-WEIGHT MANAGEMENT 1 MG/0.5ML ~~LOC~~ SOAJ
1.0000 mg | SUBCUTANEOUS | 1 refills | Status: AC
  Filled 2024-05-05: qty 2, 28d supply, fill #0
  Filled 2024-05-28: qty 2, 28d supply, fill #1

## 2024-05-05 NOTE — Patient Instructions (Signed)
 To keep you healthy, please keep in mind the following health maintenance items that you are due for:   Health Maintenance Due  Topic Date Due   Hepatitis B Vaccines 19-59 Average Risk (1 of 3 - 19+ 3-dose series) Never done   Pneumococcal Vaccine: 50+ Years (2 of 2 - PPSV23, PCV20, or PCV21) 03/04/2016   Zoster Vaccines- Shingrix (2 of 2) 02/27/2023     Best Wishes,   Dr. Lang

## 2024-05-05 NOTE — Progress Notes (Signed)
 Established patient visit   Patient: Abigail Garcia   DOB: January 02, 1971   53 y.o. Female  MRN: [id number] Visit Date: 05/05/2024  Today's healthcare provider: Rockie Agent, MD   Chief Complaint  Patient presents with   Medical Management of Chronic Issues    Patient presents for ff/u, unable to start GLP1 due to insurance not covering cost of medication    Weight Check   Subjective     HPI     Medical Management of Chronic Issues    Additional comments: Patient presents for ff/u, unable to start GLP1 due to insurance not covering cost of medication       Last edited by Cherry Chiquita HERO, CMA on 05/05/2024  9:35 AM.       Discussed the use of AI scribe software for clinical note transcription with the patient, who gave verbal consent to proceed.  History of Present Illness Abigail Garcia is a 53 year old female with class 3 obesity who presents for follow-up on weight management.  She has a BMI of 44 and her insurance has now approved Wegovy  until April 2026. She had been on a 1 mg dose of semaglutide  before stopping in October due to insurance issues. The only side effect she experienced was excessive gas, which she managed with over-the-counter remedies.  She has a history of a non-ST elevation myocardial infarction (NSTEMI) in 2014, which makes her cautious about medications that could affect her heart. Her current medications include Repatha  140 mg every two weeks for cholesterol and metoprolol  12.5 mg as needed for heart rate control.  She has been experiencing hip and back pain, which has worsened recently, leading her to resume her pain medication. She previously attended physical therapy, including aquatic therapy, which she found beneficial, but her sessions were limited to 12 weeks. She has difficulty accessing a pool for continued therapy due to transportation issues. Her current medications for pain include meloxicam  15 mg as needed for arthritis pain and  naproxen  for hip pain.  She takes norethindrone  for menstrual bleeding, which has improved since switching from Megace  to Aygestin . She denies new symptoms such as swelling or breathing difficulties. Her weight has fluctuated recently, partly due to Thanksgiving festivities.  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     Past Medical History:  Diagnosis Date   Anemia    Blood transfusion without reported diagnosis    CAD (coronary artery disease)    a. 11/2012 NSTEMI/Cath/PCI: LM nl, LAD 80-90 thrombotic (tx with Heparin  x 2 days then aspiration thrombectomy and PTCA), RI nl, LCX sm/nl, RCA nl, EF 55-65%.   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 Echo (6/14) with EF 55-60%.    Hypertension    Menorrhagia    NSTEMI (non-ST elevated myocardial infarction) (HCC)    11/2012    Obesity    RA (rheumatoid arthritis) (HCC)     Medications: Outpatient Medications Prior to Visit  Medication Sig   aspirin  81 MG EC tablet Take 1 tablet (81 mg total) by mouth daily.   clindamycin  (CLEOCIN  T) 1 % lotion Apply 1 application to the affected area 1-2 times daily as needed.   Evolocumab  (REPATHA  SURECLICK) 140 MG/ML SOAJ Inject 140 mg into the skin every 14 (  fourteen) days.   ezetimibe  (ZETIA ) 10 MG tablet Take 1 tablet (10 mg total) by mouth daily.   meloxicam  (MOBIC ) 15 MG tablet Take 1 tablet (15 mg total) by mouth daily.   metoprolol  tartrate (LOPRESSOR ) 25 MG tablet Take 0.5 tablets (12.5 mg total) by mouth 2 (two) times daily.   norethindrone  (GALLIFREY ) 5 MG tablet Take 2 tablets (10 mg total) by mouth daily.   triamcinolone cream (KENALOG) 0.1 % SMARTSIG:1 Application Topical 2-3 Times Daily   Vitamin D , Ergocalciferol , (DRISDOL ) 1.25 MG (50000 UNIT) CAPS capsule Take 1 capsule (50,000 Units total) by mouth once a week.    [DISCONTINUED] norethindrone  (AYGESTIN ) 5 MG tablet Take 2 tablets (10 mg total) by mouth daily.   [DISCONTINUED] Vitamin D , Ergocalciferol , (DRISDOL ) 1.25 MG (50000 UNIT) CAPS capsule Take 50,000 Units by mouth once a week.   [DISCONTINUED] busPIRone  (BUSPAR ) 10 MG tablet Take 1 tablet (10 mg total) by mouth 2 (two) times daily.   [DISCONTINUED] clindamycin  (CLEOCIN  T) 1 % lotion SMARTSIG:Topical 1-2 Times Daily PRN   [DISCONTINUED] Evolocumab  (REPATHA  SURECLICK) 140 MG/ML SOAJ Inject 140 mg into the skin every 14 (fourteen) days.   [DISCONTINUED] ezetimibe  (ZETIA ) 10 MG tablet Take 1 tablet (10 mg total) by mouth daily.   [DISCONTINUED] Semaglutide , 1 MG/DOSE, (OZEMPIC , 1 MG/DOSE,) 4 MG/3ML SOPN Inject 1 mg into the skin once a week.   [DISCONTINUED] semaglutide -weight management (WEGOVY ) 0.25 MG/0.5ML SOAJ SQ injection Inject 0.25 mg into the skin once a week.   [DISCONTINUED] semaglutide -weight management (WEGOVY ) 1 MG/0.5ML SOAJ SQ injection Inject 1 mg into the skin once a week.   [DISCONTINUED] semaglutide -weight management (WEGOVY ) 1.7 MG/0.75ML SOAJ SQ injection Inject 1.7 mg into the skin once a week.   No facility-administered medications prior to visit.    Review of Systems      Objective    BP 122/80 (BP Location: Left Arm, Patient Position: Sitting, Cuff Size: Normal)   Pulse 84   Ht 5' 3 (1.6 m)   Wt 251 lb (113.9 kg)   SpO2 99%   BMI 44.46 kg/m  BP Readings from Last 3 Encounters:  05/05/24 122/80  02/04/24 104/75  01/01/24 115/80   Wt Readings from Last 3 Encounters:  05/05/24 251 lb (113.9 kg)  02/04/24 238 lb 14.4 oz (108.4 kg)  01/01/24 244 lb 6.4 oz (110.9 kg)        Physical Exam  Physical Exam MEASUREMENTS: Weight- 251, BMI- 44.0. CHEST: Clear to auscultation bilaterally. No wheezes, rhonchi, or crackles. MUSCULOSKELETAL: Bilateral hip pain, left more than right. No radicular pain on spine examination.    No results found for any visits on  05/05/24.  Assessment & Plan     Problem List Items Addressed This Visit     CAD S/P percutaneous coronary angioplasty   Relevant Medications   semaglutide -weight management (WEGOVY ) 1 MG/0.5ML SOAJ SQ injection   Obesity - Primary   Relevant Medications   semaglutide -weight management (WEGOVY ) 1 MG/0.5ML SOAJ SQ injection   Rheumatoid arthritis involving multiple sites with positive rheumatoid factor (HCC)   Relevant Orders   Ambulatory referral to Physical Therapy   Other Visit Diagnoses       H/O non-ST elevation myocardial infarction (NSTEMI)       Relevant Medications   semaglutide -weight management (WEGOVY ) 1 MG/0.5ML SOAJ SQ injection     Pain of left hip       Relevant Orders   Ambulatory referral to Physical Therapy  Assessment and Plan Assessment & Plan Class 3 obesity Chronic  BMI of 44. Previous attempt to start semaglutide  was not covered by insurance. Phentermine and Topamax were considered as alternatives, but concerns about cardiovascular effects due to NSTEMI in 2014. Semaglutide  (Wegovy ) was approved for use after prior authorization issues were resolved. Previous side effect of excessive gas managed with dietary adjustments. - Restarted semaglutide  (Wegovy ) at 1 mg weekly. - Will reassess weight management plan in March. - Continue dietary adjustments for gas management.  Atherosclerotic heart disease status post NSTEMI and percutaneous coronary angioplasty Chronic condition  NSTEMI in 2014 with percutaneous coronary angioplasty. Concerns about cardiovascular effects of phentermine due to history of heart disease. Semaglutide  was approved for use after prior authorization issues were resolved. - Continue to monitor cardiovascular status while on semaglutide .  Rheumatoid arthritis with hip and back pain Chronic  Rheumatoid arthritis with worsening hip and back pain. Previous physical therapy with aquatic therapy was beneficial. Current pain  management includes meloxicam  and naproxen  as needed. Transportation issues limit access to aquatic therapy. - Submitted referral for physical therapy with aquatic therapy. - Continue meloxicam  and naproxen  as needed for pain management.  Abnormal uterine bleeding Chronic, improved  Previously managed with Aygestin . Recent change to a different medication due to insurance or discontinuation issues. Current medication has reduced bleeding significantly. - Continue current medication for abnormal uterine bleeding.  General health maintenance Discussion of vaccines including pneumococcal and Shingrix, which were paused due to side effects. Chest x-ray was scheduled but not completed due to improved pain status. - Continue to hold off on vaccines due to previous side effects. - Will reassess need for chest x-ray based on winter progress.     Return in about 3 months (around 08/03/2024) for Weight MGMT (wegovy  1.0).         Rockie Agent, MD  [facility] [phone] (phone) [phone] (fax)  [facility]

## 2024-05-07 ENCOUNTER — Encounter: Payer: Self-pay | Admitting: Family Medicine

## 2024-05-14 DIAGNOSIS — Z419 Encounter for procedure for purposes other than remedying health state, unspecified: Secondary | ICD-10-CM | POA: Diagnosis not present

## 2024-05-29 ENCOUNTER — Other Ambulatory Visit (HOSPITAL_COMMUNITY): Payer: Self-pay

## 2024-05-31 ENCOUNTER — Other Ambulatory Visit (HOSPITAL_COMMUNITY): Payer: Self-pay

## 2024-06-01 ENCOUNTER — Other Ambulatory Visit: Payer: Self-pay

## 2024-06-02 ENCOUNTER — Other Ambulatory Visit: Payer: Self-pay

## 2024-06-04 ENCOUNTER — Other Ambulatory Visit: Payer: Self-pay

## 2024-06-15 ENCOUNTER — Ambulatory Visit
Admission: EM | Admit: 2024-06-15 | Discharge: 2024-06-15 | Disposition: A | Discharge status: Home / Self Care | Attending: Emergency Medicine | Admitting: Emergency Medicine

## 2024-06-15 ENCOUNTER — Encounter: Payer: Self-pay | Admitting: Emergency Medicine

## 2024-06-15 DIAGNOSIS — L0231 Cutaneous abscess of buttock: Secondary | ICD-10-CM

## 2024-06-15 MED ORDER — DOXYCYCLINE HYCLATE 100 MG PO CAPS: 100.0000 mg | ORAL_CAPSULE | Freq: Two times a day (BID) | ORAL | 0 refills | Status: AC

## 2024-06-15 MED ORDER — ACETAMINOPHEN-CODEINE 300-30 MG PO TABS: 1.0000 | ORAL_TABLET | Freq: Four times a day (QID) | ORAL | 0 refills | Status: AC | PRN

## 2024-06-15 NOTE — ED Provider Notes (Signed)
 " [facility]    CSN: [id number] Arrival date & time: 06/15/24  1556      History   Chief Complaint No chief complaint on file.   HPI Abigail Garcia is a 54 y.o. female.   Patient presents for evaluation of abscess to the buttocks present for 4 days, worsening over the past 24 hours increasing in pain.  Has had abscess in the same location approximately 1 year ago.  Has been cleaning and applying warm compresses.  Possible bloody drainage noticed today but unsure if from abscess or from anal fistula that is present.  Has attempted use of ibuprofen without relief.  Denies presence of fever.         Past Medical History:  Diagnosis Date   Anemia    Blood transfusion without reported diagnosis    CAD (coronary artery disease)    a. 11/2012 NSTEMI/Cath/PCI: LM nl, LAD 80-90 thrombotic (tx with Heparin  x 2 days then aspiration thrombectomy and PTCA), RI nl, LCX sm/nl, RCA nl, EF 55-65%.   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 Echo (6/14) with EF 55-60%.    Hypertension    Menorrhagia    NSTEMI (non-ST elevated myocardial infarction) (HCC)    11/2012    Obesity    RA (rheumatoid arthritis) (HCC)     Patient Active Problem List   Diagnosis Date Noted   Dysplasia of cervix, low grade (CIN 1) 07/10/2023   ASCUS with positive high risk HPV on pap 06/10/2023 06/13/2023   Pressure in head 04/08/2023   COVID-19 vaccine series declined 10/22/2022   Dysfunctional uterine bleeding 10/22/2022   Epidermal inclusion cyst 10/22/2022   Healthcare maintenance 10/22/2022   Epidermal cyst 04/24/2021   Symptomatic anemia 03/12/2019   Hirsutism 04/17/2017   Prediabetes 08/18/2015   Blurry vision, bilateral 08/18/2015   Abnormal uterine bleeding (AUB) 08/11/2015   Obesity 11/11/2013   Thrombocytosis 08/31/2013   Ganglion cyst of wrist 06/12/2013   Hyperlipidemia  12/03/2012   CAD S/P percutaneous coronary angioplasty 11/30/2012   Rheumatoid arthritis involving multiple sites with positive rheumatoid factor (HCC) 11/14/2012   Anemia, iron  deficiency 11/14/2012    Past Surgical History:  Procedure Laterality Date   bilateral tubal     BREAST BIOPSY Left    benign   CARDIAC CATHETERIZATION  06/03/2012   CESAREAN SECTION     CORONARY ANGIOGRAM  11/19/2012   Procedure: CORONARY ANGIOGRAM;  Surgeon: Peter M Jordan, MD;  Location: [facility] CATH LAB;  Service: Cardiovascular;;   DILATION AND CURETTAGE OF UTERUS     DILATION AND CURETTAGE OF UTERUS N/A 03/13/2019   Procedure: DILATATION AND CURETTAGE;  Surgeon: Starla Harland BROCKS, MD;  Location: [facility] OR;  Service: Gynecology;  Laterality: N/A;   INTRAVASCULAR ULTRASOUND  11/19/2012   Procedure: INTRAVASCULAR ULTRASOUND;  Surgeon: Peter M Jordan, MD;  Location: [facility] CATH LAB;  Service: Cardiovascular;;   LEFT HEART CATHETERIZATION WITH CORONARY ANGIOGRAM N/A 11/16/2012   Procedure: LEFT HEART CATHETERIZATION WITH CORONARY ANGIOGRAM;  Surgeon: Peter M Jordan, MD;  Location: [facility] CATH LAB;  Service: Cardiovascular;  Laterality: N/A;   PERCUTANEOUS CORONARY INTERVENTION-BALLOON ONLY  11/19/2012   Procedure: PERCUTANEOUS CORONARY INTERVENTION-BALLOON ONLY;  Surgeon: Peter M Jordan, MD;  Location: [facility] CATH LAB;  Service: Cardiovascular;;    OB History     Gravida  10  Para  4   Term  2   Preterm  2   AB  4   Living  4      SAB  4   IAB  0   Ectopic  0   Multiple  1   Live Births  3            Home Medications    Prior to Admission medications  Medication Sig Start Date End Date Taking? Authorizing Provider  aspirin  81 MG EC tablet Take 1 tablet (81 mg total) by mouth daily. 03/18/19   Masoudi, Kelli, MD  clindamycin  (CLEOCIN  T) 1 % lotion Apply 1 application to the affected area 1-2 times daily as needed. 10/09/23     Evolocumab  (REPATHA  SURECLICK) 140 MG/ML SOAJ Inject 140 mg into the  skin every 14 (fourteen) days. 11/27/23   Lorene Lesley CROME, PA-C  ezetimibe  (ZETIA ) 10 MG tablet Take 1 tablet (10 mg total) by mouth daily. 11/27/23     meloxicam  (MOBIC ) 15 MG tablet Take 1 tablet (15 mg total) by mouth daily. 04/08/23   Simmons-Robinson, Rockie, MD  metoprolol  tartrate (LOPRESSOR ) 25 MG tablet Take 0.5 tablets (12.5 mg total) by mouth 2 (two) times daily. 07/15/22   Sharl Gee, MD  norethindrone  (GALLIFREY ) 5 MG tablet Take 2 tablets (10 mg total) by mouth daily. 01/13/24     semaglutide -weight management (WEGOVY ) 1 MG/0.5ML SOAJ SQ injection Inject 1 mg into the skin once a week. 05/05/24   Simmons-Robinson, Rockie, MD  triamcinolone cream (KENALOG) 0.1 % SMARTSIG:1 Application Topical 2-3 Times Daily 10/09/23   [provider]  Vitamin D , Ergocalciferol , (DRISDOL ) 1.25 MG (50000 UNIT) CAPS capsule Take 1 capsule (50,000 Units total) by mouth once a week. 01/06/24       Family History Family History  Problem Relation Age of Onset   Breast cancer Paternal Grandmother    Heart disease Other    Colon cancer Neg Hx    Rectal cancer Neg Hx    Stomach cancer Neg Hx    Esophageal cancer Neg Hx     Social History Social History[1]   Allergies   Patient has no known allergies.   Review of Systems Review of Systems   Physical Exam Triage Vital Signs ED Triage Vitals  Encounter Vitals Group     BP      Girls Systolic BP Percentile      Girls Diastolic BP Percentile      Boys Systolic BP Percentile      Boys Diastolic BP Percentile      Pulse      Resp      Temp      Temp src      SpO2      Weight      Height      Head Circumference      Peak Flow      Pain Score      Pain Loc      Pain Education      Exclude from Growth Chart    No data found.  Updated Vital Signs There were no vitals taken for this visit.  Visual Acuity Right Eye Distance:   Left Eye Distance:   Bilateral Distance:    Right Eye Near:   Left Eye Near:    Bilateral  Near:     Physical Exam Constitutional:      Appearance: Normal appearance.  Eyes:     Extraocular Movements: Extraocular movements intact.  Pulmonary:     Effort: Pulmonary effort is normal.  Genitourinary:    Comments: 2 x 3 abscess present to the lower aspect of the left buttocks adjacent to the gluteal cleft, bloody and Elisabet Gutzmer purulent drainage noted to the center, tender to palpation Neurological:     Mental Status: She is alert and oriented to person, place, and time. Mental status is at baseline.      UC Treatments / Results  Labs (all labs ordered are listed, but only abnormal results are displayed) Labs Reviewed - No data to display  EKG   Radiology No results found.  Procedures Procedures (including critical care time)  Medications Ordered in UC Medications - No data to display  Initial Impression / Assessment and Plan / UC Course  I have reviewed the triage vital signs and the nursing notes.  Pertinent labs & imaging results that were available during my care of the patient were reviewed by me and considered in my medical decision making (see chart for details).  Abscess of left buttocks  Present on exam, draining therefore I&D deferred, discussed this with patient, prescribed doxycycline  and Tylenol  3, PDMP reviewed low risk, has had success with this treatment plan in the past, recommended continue warm compresses and over-the-counter analgesics as needed, as abscess has occurred in the same location multiple times to recommended dermatology or general surgery follow-up Final Clinical Impressions(s) / UC Diagnoses   Final diagnoses:  None   Discharge Instructions   None    ED Prescriptions   None    PDMP not reviewed this encounter.     [1]  Social History Tobacco Use   Smoking status: Never   Smokeless tobacco: Never  Vaping Use   Vaping status: Never Used  Substance Use Topics   Alcohol use: No   Drug use: No     Teresa Shelba SAUNDERS,  NP 06/15/24 1627  "

## 2024-06-15 NOTE — Discharge Instructions (Signed)
 Take doxycycline  twice daily for 7 days  You may use Tylenol  3 which has codeine  for severe pain, may take every 6 hours but please be mindful can make you drowsy  As you have had abscess recur in the same location you may follow-up with dermatology or general surgery for evaluation for removal of sac which helps with reoccurrence, for fistula please follow-up with gastrointestinal  Hold warm-hot compresses to affected area at least 4 times a day, this helps to facilitate draining, the more the better  Please return for evaluation for increased swelling, increased tenderness or pain, non healing site, non draining site, you begin to have fever or chills   We reviewed the etiology of recurrent abscesses of skin.  Skin abscesses are collections of pus within the dermis and deeper skin tissues. Skin abscesses manifest as painful, tender, fluctuant, and erythematous nodules, frequently surmounted by a pustule and surrounded by a rim of erythematous swelling.  Spontaneous drainage of purulent material may occur.  Fever can occur on occasion.    -Skin abscesses can develop in healthy individuals with no predisposing conditions other than skin or nasal carriage of Staphylococcus aureus.  Individuals in close contact with others who have active infection with skin abscesses are at increased risk which is likely to explain why twin brother has similar episodes.   In addition, any process leading to a breach in the skin barrier can also predispose to the development of a skin abscesses, such as atopic dermatitis.

## 2024-06-15 NOTE — ED Triage Notes (Signed)
Provider completed triage.

## 2024-06-16 ENCOUNTER — Ambulatory Visit

## 2024-06-21 NOTE — Therapy (Signed)
 " OUTPATIENT PHYSICAL THERAPY THORACOLUMBAR EVALUATION   Patient Name: Abigail Garcia MRN: [id number] DOB:May 11, 1971, 54 y.o., female Today's Date: 06/22/2024  END OF SESSION:  PT End of Session - 06/22/24 1053     Visit Number 1    Date for Recertification  08/20/24    Authorization Type [employer]    PT Start Time 1000    PT Stop Time 1035    PT Time Calculation (min) 35 min    Activity Tolerance Patient tolerated treatment well;Patient limited by pain    Behavior During Therapy [facility] for tasks assessed/performed          Past Medical History:  Diagnosis Date   Anemia    Blood transfusion without reported diagnosis    CAD (coronary artery disease)    a. 11/2012 NSTEMI/Cath/PCI: LM nl, LAD 80-90 thrombotic (tx with Heparin  x 2 days then aspiration thrombectomy and PTCA), RI nl, LCX sm/nl, RCA nl, EF 55-65%.   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 Echo (6/14) with EF 55-60%.    Hypertension    Menorrhagia    NSTEMI (non-ST elevated myocardial infarction) (HCC)    11/2012    Obesity    RA (rheumatoid arthritis) (HCC)    Past Surgical History:  Procedure Laterality Date   bilateral tubal     BREAST BIOPSY Left    benign   CARDIAC CATHETERIZATION  06/03/2012   CESAREAN SECTION     CORONARY ANGIOGRAM  11/19/2012   Procedure: CORONARY ANGIOGRAM;  Surgeon: Peter M Jordan, MD;  Location: [facility] CATH LAB;  Service: Cardiovascular;;   DILATION AND CURETTAGE OF UTERUS     DILATION AND CURETTAGE OF UTERUS N/A 03/13/2019   Procedure: DILATATION AND CURETTAGE;  Surgeon: Starla Harland BROCKS, MD;  Location: [facility] OR;  Service: Gynecology;  Laterality: N/A;   INTRAVASCULAR ULTRASOUND  11/19/2012   Procedure: INTRAVASCULAR ULTRASOUND;  Surgeon: Peter M Jordan, MD;  Location: [facility] CATH LAB;  Service: Cardiovascular;;   LEFT HEART CATHETERIZATION WITH CORONARY ANGIOGRAM N/A 11/16/2012    Procedure: LEFT HEART CATHETERIZATION WITH CORONARY ANGIOGRAM;  Surgeon: Peter M Jordan, MD;  Location: [facility] CATH LAB;  Service: Cardiovascular;  Laterality: N/A;   PERCUTANEOUS CORONARY INTERVENTION-BALLOON ONLY  11/19/2012   Procedure: PERCUTANEOUS CORONARY INTERVENTION-BALLOON ONLY;  Surgeon: Peter M Jordan, MD;  Location: [facility] CATH LAB;  Service: Cardiovascular;;   Patient Active Problem List   Diagnosis Date Noted   Dysplasia of cervix, low grade (CIN 1) 07/10/2023   ASCUS with positive high risk HPV on pap 06/10/2023 06/13/2023   Pressure in head 04/08/2023   COVID-19 vaccine series declined 10/22/2022   Dysfunctional uterine bleeding 10/22/2022   Epidermal inclusion cyst 10/22/2022   Healthcare maintenance 10/22/2022   Epidermal cyst 04/24/2021   Symptomatic anemia 03/12/2019   Hirsutism 04/17/2017   Prediabetes 08/18/2015   Blurry vision, bilateral 08/18/2015   Abnormal uterine bleeding (AUB) 08/11/2015   Obesity 11/11/2013   Thrombocytosis 08/31/2013   Ganglion cyst of wrist 06/12/2013   Hyperlipidemia 12/03/2012   CAD S/P percutaneous coronary angioplasty 11/30/2012   Rheumatoid arthritis involving multiple sites with positive rheumatoid factor (HCC) 11/14/2012   Anemia, iron  deficiency 11/14/2012    PCP: Rockie Agent MD  REFERRING PROVIDER: Rockie Agent MD  REFERRING DIAG:  M05.79 (ICD-10-CM) - Rheumatoid arthritis involving multiple sites with positive rheumatoid factor (  HCC)  M25.552 (ICD-10-CM) - Pain of left hip    Rationale for Evaluation and Treatment: Rehabilitation  THERAPY DIAG:  Chronic pain of both hips  Other low back pain  Muscle weakness (generalized)  Difficulty in walking, not elsewhere classified  ONSET DATE: chronic  SUBJECTIVE:                                                                                                                                                                                            SUBJECTIVE STATEMENT: I was unable to get transportation to the pool, the insurance/medicaid said it was not a medical facility.  My hips have really started to hurt now.  I have been trying to do more walking.  Tried yoga and it made me worse.  PERTINENT HISTORY:  Rheumatoid arthritis with worsening hip and back pain  Atherosclerotic heart disease status post NSTEMI and percutaneous coronary angioplasty  PAIN:  Are you having pain? Yes: NPRS scale: current 6/10; worst 8/10; least 2/10 Pain location: Bilat hips laterally and posterior Pain description: intermittent, ache Aggravating factors: getting OOB; sitting Relieving factors: massage; movement, meloxicam   PRECAUTIONS: None  RED FLAGS: None   WEIGHT BEARING RESTRICTIONS: No  FALLS:  Has patient fallen in last 6 months? No  LIVING ENVIRONMENT: Lives with: lives with their family Lives in: House/apartment Stairs: Yes: Internal: 16 steps; on right going up Has following equipment at home: None   OCCUPATION: not working  PLOF: Independent  PATIENT GOALS: reduced pain, increased movement  NEXT MD VISIT: March 2026  OBJECTIVE:  Note: Objective measures were completed at Evaluation unless otherwise noted.  DIAGNOSTIC FINDINGS:  None in chart  PATIENT SURVEYS:  LEFS: 40/80  COGNITION: Overall cognitive status: Within functional limits for tasks assessed     SENSATION: WFL  MUSCLE LENGTH: Hamstrings: WFL tested in sitting       POSTURE: rounded shoulders and increased lumbar lordosis   PALPATION: TTP moderate bilat trochanteric bursas, minimal knee joint lines and ankles/feet joints  LUMBAR ROM:   WFL  LOWER EXTREMITY ROM:     Active  Right eval Left eval  Hip flexion    Hip extension    Hip abduction    Hip adduction    Hip internal rotation    Hip external rotation wfl ~20  Knee flexion    Knee extension    Ankle dorsiflexion    Ankle plantarflexion    Ankle inversion    Ankle eversion      (Blank rows = not tested)  LOWER EXTREMITY MMT:    MMT Right eval Left eval  Hip flexion 4- P!  3+ P!  Hip extension    Hip abduction 5 5  Hip adduction 5 5  Hip internal rotation    Hip external rotation    Knee flexion 5 5  Knee extension 5 5  Ankle dorsiflexion 5 5  Ankle plantarflexion 5 5  Ankle inversion    Ankle eversion     (Blank rows = not tested)  LUMBAR SPECIAL TESTS:  Slump test: Negative  FUNCTIONAL TESTS:  TUG: 14.49 4 stage balance:passed  GAIT: Distance walked: 500 ft Assistive device utilized: None Level of assistance: Complete Independence Comments: increased stance time bilaterally, slowed cadence  TREATMENT  Eval Self care:Posture and body mechanic instruction; use of AD; walking for exercises; Chair yoga                                                                                                                                PATIENT EDUCATION:  Education details: Discussed eval findings, rehab rationale, aquatic program progression/POC and pools in area. Patient is in agreement  Person educated: Patient Education method: Explanation Education comprehension: verbalized understanding  HOME EXERCISE PROGRAM: Previous program with DC 02/19/24 Access Code: [email or web address]   ASSESSMENT:  CLINICAL IMPRESSION: Patient is a 54 y.o. f who was seen today for physical therapy evaluation and treatment for Hip and LBP due to RA.  Pt is well known to this clinic as she had just ended an episode ~ 4-5 months ago.  She returns today with complaints of increased bilateral hip pain L>R along with residual LBP.  She had gained access to pool but was unable to get transportation due to it not being a medical facility through [facility]. Patient presents with pain limited deficits throughout bilat hips, LB and through knee and ankle/foot joints in strength, ROM, endurance, activity tolerance, gait, balance, and functional mobility with ADL's. She is having to modify  and restrict ADL's as indicated by outcome measure score as well as subjective information and objective measures. Patient will benefit from skilled aquatic physical therapy in order to improve function and reduce impairment.      OBJECTIVE IMPAIRMENTS: Abnormal gait, decreased activity tolerance, decreased balance, decreased endurance, decreased knowledge of condition, decreased knowledge of use of DME, decreased mobility, difficulty walking, decreased ROM, decreased strength, hypomobility, increased edema, impaired perceived functional ability, increased muscle spasms, impaired flexibility, impaired UE functional use, improper body mechanics, postural dysfunction, obesity, and pain.    ACTIVITY LIMITATIONS: carrying, lifting, bending, standing, squatting, stairs, transfers, bed mobility, dressing, reach over head, hygiene/grooming, and locomotion level   PARTICIPATION LIMITATIONS: interpersonal relationship, community activity, occupation, and   difficulty moving around, doing her daily tasks such as working, shopping, going up and down stairs, using her hands, dancing, spending time with grandchildren, getting up and down from the floor, keeping her balance   PERSONAL FACTORS: Fitness, Past/current experiences, Profession, Time since onset of injury/illness/exacerbation, Transportation, and 3+ comorbidities:   anemia, CAD, esophagitis, menorrhagia, NSTEMI (and  cardiac cath 2014), active severe Stage 3 rheumatoid arthritis with positive rheumatoid factor are also affecting patient's functional outcome.   REHAB POTENTIAL: Fair chronicity of condition  CLINICAL DECISION MAKING: Stable/uncomplicated  EVALUATION COMPLEXITY: Low   GOALS: Goals reviewed with patient? Yes  SHORT TERM GOALS: Target date: 07/13/24  Pt will tolerate full aquatic sessions consistently without increase in pain and with improving function to demonstrate good toleration and effectiveness of intervention.  Baseline: Goal  status: INITIAL    LONG TERM GOALS: Target date: 08/20/24  Pt to improve on LEFS by at least 9 point to demonstrate statistically significant Improvement in function. Baseline: 40/80 Goal status: INITIAL  2.  Pt will improve strength in hips by at least 1 grade to demonstrate improved overall physical function Baseline:  Goal status: INITIAL  3.  Pt will report decrease in pain by at least 75% for improved toleration to activity/quality of life and to demonstrate improved management of pain. Baseline:  Goal status: INITIAL  4.  Pt will be indep with final aquatic HEP for continued management of condition Baseline:  Goal status: INITIAL  5.  Pt will improve on Tug test to <or=13.5s (13.5=community dwelling adults) to demonstrate improvement in lower extremity function, mobility and decreased fall risk.  Baseline: 14.49 Goal status: INITIAL  6.  Pt will improve Lt hip external rotation to = to contralateral side Baseline:  Goal status: INITIAL  PLAN:  PT FREQUENCY: 1-2x/week  PT DURATION: 8 weeks  PLANNED INTERVENTIONS: 97164- PT Re-evaluation, 97110-Therapeutic exercises, 97530- Therapeutic activity, 97112- Neuromuscular re-education, 97535- Self Care, [id number]- Manual therapy, [id number]- Gait training, [phone]- Aquatic Therapy, [phone] (1-2 muscles), [id number] (3+ muscles)- Dry Needling, Patient/Family education, Balance training, Stair training, Taping, Joint mobilization, DME instructions, Cryotherapy, and Moist heat.  PLAN FOR NEXT SESSION: Aquatics only: strengthening  lumbosacral area and through LE.  Balance and proprioception retraining, pain management; assist as able in pt gaining transportation to pool for completion in HEP   [place]) Judeth Gilles MPT 06/22/24 10:56 AM [facility] [address] [place], [place], [address] Phone: [phone]   Fax:  [phone]   For all possible CPT codes, reference the Planned  Interventions line above.     Check all conditions that are expected to impact treatment: {Conditions expected to impact treatment:Morbid obesity, Musculoskeletal disorders, and Active major medical illness   If treatment provided at initial evaluation, no treatment charged due to lack of authorization.       "

## 2024-06-22 ENCOUNTER — Encounter (HOSPITAL_BASED_OUTPATIENT_CLINIC_OR_DEPARTMENT_OTHER): Payer: Self-pay | Admitting: Physical Therapy

## 2024-06-22 ENCOUNTER — Other Ambulatory Visit: Payer: Self-pay

## 2024-06-22 ENCOUNTER — Ambulatory Visit (HOSPITAL_BASED_OUTPATIENT_CLINIC_OR_DEPARTMENT_OTHER): Attending: Family Medicine | Admitting: Physical Therapy

## 2024-06-22 DIAGNOSIS — M0579 Rheumatoid arthritis with rheumatoid factor of multiple sites without organ or systems involvement: Secondary | ICD-10-CM | POA: Diagnosis not present

## 2024-06-22 DIAGNOSIS — R262 Difficulty in walking, not elsewhere classified: Secondary | ICD-10-CM | POA: Insufficient documentation

## 2024-06-22 DIAGNOSIS — M25551 Pain in right hip: Secondary | ICD-10-CM | POA: Insufficient documentation

## 2024-06-22 DIAGNOSIS — M6281 Muscle weakness (generalized): Secondary | ICD-10-CM | POA: Diagnosis present

## 2024-06-22 DIAGNOSIS — M5459 Other low back pain: Secondary | ICD-10-CM | POA: Insufficient documentation

## 2024-06-22 DIAGNOSIS — M25552 Pain in left hip: Secondary | ICD-10-CM | POA: Insufficient documentation

## 2024-06-22 DIAGNOSIS — G8929 Other chronic pain: Secondary | ICD-10-CM | POA: Insufficient documentation

## 2024-06-30 ENCOUNTER — Ambulatory Visit (HOSPITAL_BASED_OUTPATIENT_CLINIC_OR_DEPARTMENT_OTHER): Admitting: Physical Therapy

## 2024-07-07 ENCOUNTER — Ambulatory Visit (HOSPITAL_BASED_OUTPATIENT_CLINIC_OR_DEPARTMENT_OTHER): Admitting: Physical Therapy

## 2024-07-14 ENCOUNTER — Ambulatory Visit (HOSPITAL_BASED_OUTPATIENT_CLINIC_OR_DEPARTMENT_OTHER): Payer: Self-pay | Admitting: Physical Therapy

## 2024-07-14 ENCOUNTER — Ambulatory Visit (HOSPITAL_BASED_OUTPATIENT_CLINIC_OR_DEPARTMENT_OTHER): Admitting: Physical Therapy

## 2024-07-22 ENCOUNTER — Ambulatory Visit (HOSPITAL_BASED_OUTPATIENT_CLINIC_OR_DEPARTMENT_OTHER): Admitting: Physical Therapy

## 2024-07-28 ENCOUNTER — Ambulatory Visit (HOSPITAL_BASED_OUTPATIENT_CLINIC_OR_DEPARTMENT_OTHER): Admitting: Physical Therapy

## 2024-07-30 ENCOUNTER — Ambulatory Visit: Admitting: Pediatrics

## 2024-08-05 ENCOUNTER — Ambulatory Visit (HOSPITAL_BASED_OUTPATIENT_CLINIC_OR_DEPARTMENT_OTHER): Admitting: Physical Therapy

## 2024-08-06 ENCOUNTER — Ambulatory Visit: Admitting: Family Medicine

## 2024-08-12 ENCOUNTER — Ambulatory Visit (HOSPITAL_BASED_OUTPATIENT_CLINIC_OR_DEPARTMENT_OTHER): Admitting: Physical Therapy

## 2024-08-19 ENCOUNTER — Ambulatory Visit (HOSPITAL_BASED_OUTPATIENT_CLINIC_OR_DEPARTMENT_OTHER): Admitting: Physical Therapy

## 2024-08-26 ENCOUNTER — Ambulatory Visit (HOSPITAL_BASED_OUTPATIENT_CLINIC_OR_DEPARTMENT_OTHER): Admitting: Physical Therapy
